# Patient Record
Sex: Female | Born: 1945 | Race: Black or African American | Hispanic: No | Marital: Single | Smoking: Former smoker
Health system: Southern US, Community
[De-identification: ages and names within clinical notes are randomized; demographics above are authoritative.]

## PROBLEM LIST (undated history)

## (undated) DIAGNOSIS — M1990 Unspecified osteoarthritis, unspecified site: Secondary | ICD-10-CM

## (undated) DIAGNOSIS — Z87891 Personal history of nicotine dependence: Secondary | ICD-10-CM

## (undated) DIAGNOSIS — H6120 Impacted cerumen, unspecified ear: Secondary | ICD-10-CM

## (undated) DIAGNOSIS — J019 Acute sinusitis, unspecified: Secondary | ICD-10-CM

## (undated) DIAGNOSIS — R270 Ataxia, unspecified: Secondary | ICD-10-CM

## (undated) DIAGNOSIS — R1012 Left upper quadrant pain: Secondary | ICD-10-CM

## (undated) DIAGNOSIS — M79676 Pain in unspecified toe(s): Secondary | ICD-10-CM

## (undated) DIAGNOSIS — M5030 Other cervical disc degeneration, unspecified cervical region: Secondary | ICD-10-CM

## (undated) DIAGNOSIS — R109 Unspecified abdominal pain: Secondary | ICD-10-CM

## (undated) DIAGNOSIS — T7840XA Allergy, unspecified, initial encounter: Secondary | ICD-10-CM

## (undated) DIAGNOSIS — R5383 Other fatigue: Secondary | ICD-10-CM

## (undated) DIAGNOSIS — J069 Acute upper respiratory infection, unspecified: Secondary | ICD-10-CM

## (undated) DIAGNOSIS — G4733 Obstructive sleep apnea (adult) (pediatric): Secondary | ICD-10-CM

## (undated) DIAGNOSIS — I10 Essential (primary) hypertension: Secondary | ICD-10-CM

## (undated) DIAGNOSIS — G709 Myoneural disorder, unspecified: Secondary | ICD-10-CM

## (undated) DIAGNOSIS — R197 Diarrhea, unspecified: Secondary | ICD-10-CM

## (undated) DIAGNOSIS — D869 Sarcoidosis, unspecified: Secondary | ICD-10-CM

## (undated) DIAGNOSIS — F32A Depression, unspecified: Secondary | ICD-10-CM

## (undated) DIAGNOSIS — G4730 Sleep apnea, unspecified: Secondary | ICD-10-CM

## (undated) DIAGNOSIS — N182 Chronic kidney disease, stage 2 (mild): Secondary | ICD-10-CM

## (undated) DIAGNOSIS — D126 Benign neoplasm of colon, unspecified: Secondary | ICD-10-CM

## (undated) DIAGNOSIS — M542 Cervicalgia: Secondary | ICD-10-CM

## (undated) DIAGNOSIS — N179 Acute kidney failure, unspecified: Secondary | ICD-10-CM

## (undated) DIAGNOSIS — H409 Unspecified glaucoma: Secondary | ICD-10-CM

## (undated) DIAGNOSIS — N189 Chronic kidney disease, unspecified: Secondary | ICD-10-CM

## (undated) DIAGNOSIS — Z9289 Personal history of other medical treatment: Secondary | ICD-10-CM

## (undated) DIAGNOSIS — G4736 Sleep related hypoventilation in conditions classified elsewhere: Secondary | ICD-10-CM

## (undated) DIAGNOSIS — E662 Morbid (severe) obesity with alveolar hypoventilation: Secondary | ICD-10-CM

## (undated) DIAGNOSIS — E1142 Type 2 diabetes mellitus with diabetic polyneuropathy: Secondary | ICD-10-CM

## (undated) DIAGNOSIS — L52 Erythema nodosum: Secondary | ICD-10-CM

## (undated) DIAGNOSIS — R0683 Snoring: Secondary | ICD-10-CM

## (undated) DIAGNOSIS — E785 Hyperlipidemia, unspecified: Secondary | ICD-10-CM

## (undated) DIAGNOSIS — R490 Dysphonia: Secondary | ICD-10-CM

## (undated) DIAGNOSIS — E1140 Type 2 diabetes mellitus with diabetic neuropathy, unspecified: Secondary | ICD-10-CM

## (undated) DIAGNOSIS — K219 Gastro-esophageal reflux disease without esophagitis: Secondary | ICD-10-CM

## (undated) DIAGNOSIS — H9201 Otalgia, right ear: Secondary | ICD-10-CM

## (undated) DIAGNOSIS — E1165 Type 2 diabetes mellitus with hyperglycemia: Secondary | ICD-10-CM

## (undated) DIAGNOSIS — H269 Unspecified cataract: Secondary | ICD-10-CM

## (undated) DIAGNOSIS — G4710 Hypersomnia, unspecified: Secondary | ICD-10-CM

## (undated) DIAGNOSIS — M129 Arthropathy, unspecified: Secondary | ICD-10-CM

## (undated) DIAGNOSIS — R251 Tremor, unspecified: Secondary | ICD-10-CM

## (undated) DIAGNOSIS — M545 Low back pain: Secondary | ICD-10-CM

## (undated) DIAGNOSIS — J984 Other disorders of lung: Secondary | ICD-10-CM

## (undated) DIAGNOSIS — R0789 Other chest pain: Secondary | ICD-10-CM

## (undated) HISTORY — DX: Chronic kidney disease, unspecified: N18.9

## (undated) HISTORY — DX: Acute upper respiratory infection, unspecified: J06.9

## (undated) HISTORY — DX: Diarrhea, unspecified: R19.7

## (undated) HISTORY — DX: Personal history of other medical treatment: Z92.89

## (undated) HISTORY — DX: Acute sinusitis, unspecified: J01.90

## (undated) HISTORY — DX: Sleep related hypoventilation in conditions classified elsewhere: G47.36

## (undated) HISTORY — DX: Low back pain: M54.5

## (undated) HISTORY — DX: Essential (primary) hypertension: I10

## (undated) HISTORY — DX: Unspecified glaucoma: H40.9

## (undated) HISTORY — PX: APPENDECTOMY: SHX54

## (undated) HISTORY — DX: Unspecified abdominal pain: R10.9

## (undated) HISTORY — DX: Morbid (severe) obesity due to excess calories: E66.01

## (undated) HISTORY — DX: Otalgia, right ear: H92.01

## (undated) HISTORY — DX: Benign neoplasm of colon, unspecified: D12.6

## (undated) HISTORY — DX: Acute kidney failure, unspecified: N17.9

## (undated) HISTORY — DX: Unspecified cataract: H26.9

## (undated) HISTORY — DX: Erythema nodosum: L52

## (undated) HISTORY — DX: Other chest pain: R07.89

## (undated) HISTORY — PX: EYE SURGERY: SHX253

## (undated) HISTORY — DX: Dysphonia: R49.0

## (undated) HISTORY — PX: CATARACT EXTRACTION, BILATERAL: SHX1313

## (undated) HISTORY — DX: Type 2 diabetes mellitus with diabetic neuropathy, unspecified: E11.40

## (undated) HISTORY — DX: Left upper quadrant pain: R10.12

## (undated) HISTORY — DX: Hypersomnia, unspecified: G47.10

## (undated) HISTORY — DX: Hyperlipidemia, unspecified: E78.5

## (undated) HISTORY — DX: Pain in unspecified toe(s): M79.676

## (undated) HISTORY — DX: Snoring: R06.83

## (undated) HISTORY — DX: Allergy, unspecified, initial encounter: T78.40XA

## (undated) HISTORY — DX: Depression, unspecified: F32.A

## (undated) HISTORY — PX: ABDOMINAL HYSTERECTOMY: SHX81

## (undated) HISTORY — DX: Obstructive sleep apnea (adult) (pediatric): G47.33

## (undated) HISTORY — DX: Personal history of nicotine dependence: Z87.891

## (undated) HISTORY — PX: SPINE SURGERY: SHX786

## (undated) HISTORY — DX: Type 2 diabetes mellitus with hyperglycemia: E11.65

## (undated) HISTORY — DX: Other fatigue: R53.83

## (undated) HISTORY — DX: Gastro-esophageal reflux disease without esophagitis: K21.9

## (undated) HISTORY — DX: Unspecified osteoarthritis, unspecified site: M19.90

## (undated) HISTORY — DX: Impacted cerumen, unspecified ear: H61.20

## (undated) HISTORY — DX: Morbid (severe) obesity with alveolar hypoventilation: E66.2

## (undated) HISTORY — PX: TONSILLECTOMY: SUR1361

## (undated) HISTORY — DX: Cervicalgia: M54.2

## (undated) HISTORY — DX: Other cervical disc degeneration, unspecified cervical region: M50.30

## (undated) HISTORY — DX: Other disorders of lung: J98.4

## (undated) HISTORY — DX: Myoneural disorder, unspecified: G70.9

## (undated) HISTORY — DX: Sarcoidosis, unspecified: D86.9

## (undated) HISTORY — DX: Type 2 diabetes mellitus with diabetic polyneuropathy: E11.42

## (undated) HISTORY — DX: Chronic kidney disease, stage 2 (mild): N18.2

## (undated) HISTORY — DX: Arthropathy, unspecified: M12.9

## (undated) HISTORY — DX: Ataxia, unspecified: R27.0

## (undated) HISTORY — DX: Sleep apnea, unspecified: G47.30

## (undated) HISTORY — DX: Tremor, unspecified: R25.1

---

## 1998-06-03 ENCOUNTER — Other Ambulatory Visit: Admission: RE | Admit: 1998-06-03 | Discharge: 1998-06-03 | Payer: Self-pay | Admitting: Obstetrics and Gynecology

## 1998-09-29 ENCOUNTER — Inpatient Hospital Stay (HOSPITAL_COMMUNITY): Admission: EM | Admit: 1998-09-29 | Discharge: 1998-09-30 | Payer: Self-pay | Admitting: Emergency Medicine

## 1998-09-29 ENCOUNTER — Encounter: Payer: Self-pay | Admitting: Emergency Medicine

## 1998-12-14 ENCOUNTER — Ambulatory Visit (HOSPITAL_COMMUNITY): Admission: RE | Admit: 1998-12-14 | Discharge: 1998-12-14 | Payer: Self-pay | Admitting: Gastroenterology

## 2000-02-06 ENCOUNTER — Other Ambulatory Visit: Admission: RE | Admit: 2000-02-06 | Discharge: 2000-02-06 | Payer: Self-pay | Admitting: Obstetrics and Gynecology

## 2001-08-12 ENCOUNTER — Other Ambulatory Visit: Admission: RE | Admit: 2001-08-12 | Discharge: 2001-08-12 | Payer: Self-pay | Admitting: Obstetrics and Gynecology

## 2002-12-07 ENCOUNTER — Encounter: Payer: Self-pay | Admitting: Neurosurgery

## 2002-12-09 ENCOUNTER — Observation Stay (HOSPITAL_COMMUNITY): Admission: RE | Admit: 2002-12-09 | Discharge: 2002-12-10 | Payer: Self-pay | Admitting: Neurosurgery

## 2002-12-09 ENCOUNTER — Encounter: Payer: Self-pay | Admitting: Neurosurgery

## 2003-01-28 ENCOUNTER — Ambulatory Visit (HOSPITAL_COMMUNITY): Admission: RE | Admit: 2003-01-28 | Discharge: 2003-01-28 | Payer: Self-pay | Admitting: Neurosurgery

## 2003-02-19 ENCOUNTER — Inpatient Hospital Stay (HOSPITAL_COMMUNITY): Admission: RE | Admit: 2003-02-19 | Discharge: 2003-02-22 | Payer: Self-pay | Admitting: Neurosurgery

## 2003-07-23 ENCOUNTER — Other Ambulatory Visit: Admission: RE | Admit: 2003-07-23 | Discharge: 2003-07-23 | Payer: Self-pay | Admitting: Obstetrics and Gynecology

## 2003-08-19 ENCOUNTER — Ambulatory Visit (HOSPITAL_COMMUNITY): Admission: RE | Admit: 2003-08-19 | Discharge: 2003-08-19 | Payer: Self-pay | Admitting: Emergency Medicine

## 2004-07-07 ENCOUNTER — Encounter: Admission: RE | Admit: 2004-07-07 | Discharge: 2004-07-07 | Payer: Self-pay | Admitting: Emergency Medicine

## 2004-07-14 ENCOUNTER — Encounter: Admission: RE | Admit: 2004-07-14 | Discharge: 2004-07-14 | Payer: Self-pay | Admitting: Orthopedic Surgery

## 2004-08-02 ENCOUNTER — Encounter: Admission: RE | Admit: 2004-08-02 | Discharge: 2004-08-02 | Payer: Self-pay | Admitting: Orthopedic Surgery

## 2004-08-23 ENCOUNTER — Encounter: Admission: RE | Admit: 2004-08-23 | Discharge: 2004-08-23 | Payer: Self-pay | Admitting: Orthopedic Surgery

## 2005-05-28 ENCOUNTER — Ambulatory Visit: Payer: Self-pay | Admitting: Emergency Medicine

## 2005-06-04 ENCOUNTER — Ambulatory Visit: Payer: Self-pay | Admitting: Internal Medicine

## 2005-06-13 ENCOUNTER — Ambulatory Visit: Payer: Self-pay | Admitting: Emergency Medicine

## 2005-07-02 ENCOUNTER — Ambulatory Visit: Payer: Self-pay | Admitting: Emergency Medicine

## 2005-10-14 ENCOUNTER — Encounter: Admission: RE | Admit: 2005-10-14 | Discharge: 2005-10-14 | Payer: Self-pay | Admitting: Orthopedic Surgery

## 2005-10-16 ENCOUNTER — Ambulatory Visit: Payer: Self-pay | Admitting: Emergency Medicine

## 2006-01-14 ENCOUNTER — Ambulatory Visit: Payer: Self-pay | Admitting: Emergency Medicine

## 2006-01-15 ENCOUNTER — Ambulatory Visit: Payer: Self-pay | Admitting: Emergency Medicine

## 2006-01-17 ENCOUNTER — Encounter: Admission: RE | Admit: 2006-01-17 | Discharge: 2006-01-17 | Payer: Self-pay | Admitting: Emergency Medicine

## 2006-07-23 ENCOUNTER — Encounter: Admission: RE | Admit: 2006-07-23 | Discharge: 2006-07-23 | Payer: Self-pay | Admitting: Orthopedic Surgery

## 2006-08-05 ENCOUNTER — Encounter: Admission: RE | Admit: 2006-08-05 | Discharge: 2006-08-05 | Payer: Self-pay | Admitting: Orthopedic Surgery

## 2006-08-19 ENCOUNTER — Encounter: Admission: RE | Admit: 2006-08-19 | Discharge: 2006-08-19 | Payer: Self-pay | Admitting: Orthopedic Surgery

## 2007-01-15 ENCOUNTER — Encounter: Admission: RE | Admit: 2007-01-15 | Discharge: 2007-01-15 | Payer: Self-pay | Admitting: Orthopedic Surgery

## 2007-02-07 DIAGNOSIS — E1165 Type 2 diabetes mellitus with hyperglycemia: Secondary | ICD-10-CM | POA: Insufficient documentation

## 2007-02-07 DIAGNOSIS — Z87891 Personal history of nicotine dependence: Secondary | ICD-10-CM

## 2007-02-07 DIAGNOSIS — Z9079 Acquired absence of other genital organ(s): Secondary | ICD-10-CM | POA: Insufficient documentation

## 2007-02-07 DIAGNOSIS — I10 Essential (primary) hypertension: Secondary | ICD-10-CM | POA: Insufficient documentation

## 2007-02-07 DIAGNOSIS — IMO0002 Reserved for concepts with insufficient information to code with codable children: Secondary | ICD-10-CM

## 2007-02-07 DIAGNOSIS — E1129 Type 2 diabetes mellitus with other diabetic kidney complication: Secondary | ICD-10-CM | POA: Insufficient documentation

## 2007-02-07 DIAGNOSIS — D869 Sarcoidosis, unspecified: Secondary | ICD-10-CM | POA: Insufficient documentation

## 2007-02-07 DIAGNOSIS — E785 Hyperlipidemia, unspecified: Secondary | ICD-10-CM | POA: Insufficient documentation

## 2007-02-07 DIAGNOSIS — Z9289 Personal history of other medical treatment: Secondary | ICD-10-CM | POA: Insufficient documentation

## 2007-02-07 DIAGNOSIS — D860 Sarcoidosis of lung: Secondary | ICD-10-CM | POA: Insufficient documentation

## 2007-02-07 HISTORY — DX: Type 2 diabetes mellitus with hyperglycemia: E11.65

## 2007-02-07 HISTORY — DX: Essential (primary) hypertension: I10

## 2007-02-07 HISTORY — DX: Reserved for concepts with insufficient information to code with codable children: IMO0002

## 2007-02-07 HISTORY — DX: Personal history of nicotine dependence: Z87.891

## 2007-03-07 ENCOUNTER — Ambulatory Visit: Payer: Self-pay | Admitting: Emergency Medicine

## 2007-04-03 ENCOUNTER — Ambulatory Visit: Payer: Self-pay | Admitting: Emergency Medicine

## 2007-05-05 ENCOUNTER — Ambulatory Visit: Payer: Self-pay | Admitting: Emergency Medicine

## 2008-02-26 ENCOUNTER — Emergency Department (HOSPITAL_COMMUNITY): Admission: EM | Admit: 2008-02-26 | Discharge: 2008-02-26 | Payer: Self-pay | Admitting: Family Medicine

## 2008-04-03 ENCOUNTER — Emergency Department (HOSPITAL_COMMUNITY): Admission: EM | Admit: 2008-04-03 | Discharge: 2008-04-03 | Payer: Self-pay | Admitting: Emergency Medicine

## 2008-04-07 ENCOUNTER — Encounter: Admission: RE | Admit: 2008-04-07 | Discharge: 2008-04-07 | Payer: Self-pay | Admitting: Orthopedic Surgery

## 2008-05-13 ENCOUNTER — Encounter: Admission: RE | Admit: 2008-05-13 | Discharge: 2008-05-13 | Payer: Self-pay | Admitting: Orthopedic Surgery

## 2008-05-28 ENCOUNTER — Ambulatory Visit: Payer: Self-pay | Admitting: Family Medicine

## 2008-05-28 DIAGNOSIS — E1140 Type 2 diabetes mellitus with diabetic neuropathy, unspecified: Secondary | ICD-10-CM

## 2008-05-28 DIAGNOSIS — M545 Low back pain, unspecified: Secondary | ICD-10-CM

## 2008-05-28 HISTORY — DX: Type 2 diabetes mellitus with diabetic neuropathy, unspecified: E11.40

## 2008-05-28 HISTORY — DX: Low back pain, unspecified: M54.50

## 2008-06-04 ENCOUNTER — Ambulatory Visit: Payer: Self-pay | Admitting: Family Medicine

## 2008-06-04 ENCOUNTER — Encounter: Admission: RE | Admit: 2008-06-04 | Discharge: 2008-06-04 | Payer: Self-pay | Admitting: Orthopedic Surgery

## 2008-06-04 LAB — CONVERTED CEMR LAB: Hgb A1c MFr Bld: 12.6 %

## 2008-06-06 LAB — CONVERTED CEMR LAB
ALT: 25 units/L (ref 0–35)
AST: 21 units/L (ref 0–37)
CO2: 21 meq/L (ref 19–32)
Cholesterol: 305 mg/dL — ABNORMAL HIGH (ref 0–200)
LDL Cholesterol: 207 mg/dL — ABNORMAL HIGH (ref 0–99)
Magnesium: 2.1 mg/dL (ref 1.5–2.5)
Sodium: 138 meq/L (ref 135–145)
TSH: 2.738 microintl units/mL (ref 0.350–4.50)
Total Bilirubin: 0.4 mg/dL (ref 0.3–1.2)
Total CHOL/HDL Ratio: 3.8
Total Protein: 7 g/dL (ref 6.0–8.3)
VLDL: 17 mg/dL (ref 0–40)

## 2008-07-02 ENCOUNTER — Ambulatory Visit: Payer: Self-pay | Admitting: Family Medicine

## 2008-09-01 ENCOUNTER — Ambulatory Visit: Payer: Self-pay | Admitting: Family Medicine

## 2008-09-01 LAB — CONVERTED CEMR LAB: Hgb A1c MFr Bld: 9.8 %

## 2008-09-03 ENCOUNTER — Ambulatory Visit: Payer: Self-pay | Admitting: Family Medicine

## 2008-09-03 LAB — CONVERTED CEMR LAB

## 2008-09-06 LAB — CONVERTED CEMR LAB
ALT: 17 units/L (ref 0–35)
AST: 17 units/L (ref 0–37)
Alkaline Phosphatase: 81 units/L (ref 39–117)
BUN: 35 mg/dL — ABNORMAL HIGH (ref 6–23)
Chloride: 101 meq/L (ref 96–112)
Creatinine, Ser: 1.15 mg/dL (ref 0.40–1.20)
Total Bilirubin: 0.4 mg/dL (ref 0.3–1.2)

## 2008-09-13 ENCOUNTER — Telehealth: Payer: Self-pay | Admitting: *Deleted

## 2008-09-15 ENCOUNTER — Ambulatory Visit: Payer: Self-pay | Admitting: Emergency Medicine

## 2008-11-12 ENCOUNTER — Encounter: Admission: RE | Admit: 2008-11-12 | Discharge: 2008-11-12 | Payer: Self-pay | Admitting: Orthopedic Surgery

## 2008-12-09 ENCOUNTER — Encounter: Admission: RE | Admit: 2008-12-09 | Discharge: 2008-12-09 | Payer: Self-pay | Admitting: Orthopedic Surgery

## 2009-02-25 ENCOUNTER — Ambulatory Visit: Payer: Self-pay | Admitting: Family Medicine

## 2009-02-25 DIAGNOSIS — H409 Unspecified glaucoma: Secondary | ICD-10-CM

## 2009-02-25 HISTORY — DX: Unspecified glaucoma: H40.9

## 2009-02-25 LAB — CONVERTED CEMR LAB: Hgb A1c MFr Bld: 11.4 %

## 2009-02-28 LAB — CONVERTED CEMR LAB
ALT: 14 units/L (ref 0–35)
Albumin: 3.9 g/dL (ref 3.5–5.2)
CO2: 23 meq/L (ref 19–32)
Chloride: 104 meq/L (ref 96–112)
Direct LDL: 203 mg/dL — ABNORMAL HIGH
Glucose, Bld: 138 mg/dL — ABNORMAL HIGH (ref 70–99)
Potassium: 4.5 meq/L (ref 3.5–5.3)
Sodium: 139 meq/L (ref 135–145)
Total Protein: 6.6 g/dL (ref 6.0–8.3)

## 2009-03-16 ENCOUNTER — Ambulatory Visit: Payer: Self-pay | Admitting: Family Medicine

## 2009-03-21 ENCOUNTER — Encounter: Payer: Self-pay | Admitting: *Deleted

## 2009-03-24 ENCOUNTER — Emergency Department (HOSPITAL_COMMUNITY): Admission: EM | Admit: 2009-03-24 | Discharge: 2009-03-24 | Payer: Self-pay | Admitting: Family Medicine

## 2009-04-06 ENCOUNTER — Telehealth: Payer: Self-pay | Admitting: Family Medicine

## 2009-04-06 ENCOUNTER — Ambulatory Visit: Payer: Self-pay | Admitting: Family Medicine

## 2009-04-11 ENCOUNTER — Encounter: Payer: Self-pay | Admitting: Family Medicine

## 2009-04-23 HISTORY — PX: POLYPECTOMY: SHX149

## 2009-04-23 HISTORY — PX: COLONOSCOPY: SHX174

## 2009-04-28 ENCOUNTER — Encounter: Payer: Self-pay | Admitting: Family Medicine

## 2009-05-03 DIAGNOSIS — D126 Benign neoplasm of colon, unspecified: Secondary | ICD-10-CM

## 2009-05-03 HISTORY — DX: Benign neoplasm of colon, unspecified: D12.6

## 2009-06-05 ENCOUNTER — Emergency Department (HOSPITAL_COMMUNITY): Admission: EM | Admit: 2009-06-05 | Discharge: 2009-06-05 | Payer: Self-pay | Admitting: Family Medicine

## 2009-07-01 ENCOUNTER — Ambulatory Visit: Payer: Self-pay | Admitting: Family Medicine

## 2009-07-01 LAB — CONVERTED CEMR LAB
Hgb A1c MFr Bld: 10.5 %
Ketones, urine, test strip: NEGATIVE
Nitrite: NEGATIVE
Specific Gravity, Urine: 1.02
Urobilinogen, UA: 0.2
WBC Urine, dipstick: NEGATIVE

## 2009-07-13 ENCOUNTER — Encounter: Payer: Self-pay | Admitting: Family Medicine

## 2009-07-13 ENCOUNTER — Ambulatory Visit: Payer: Self-pay | Admitting: Family Medicine

## 2009-07-14 LAB — CONVERTED CEMR LAB
BUN: 21 mg/dL (ref 6–23)
Chloride: 105 meq/L (ref 96–112)
Creatinine, Ser: 0.99 mg/dL (ref 0.40–1.20)
Glucose, Bld: 143 mg/dL — ABNORMAL HIGH (ref 70–99)
LDL Cholesterol: 157 mg/dL — ABNORMAL HIGH (ref 0–99)
Potassium: 4.5 meq/L (ref 3.5–5.3)
Triglycerides: 90 mg/dL (ref <150)
VLDL: 18 mg/dL (ref 0–40)

## 2009-10-05 ENCOUNTER — Ambulatory Visit: Payer: Self-pay | Admitting: Family Medicine

## 2009-10-05 LAB — CONVERTED CEMR LAB
Albumin: 3.8 g/dL (ref 3.5–5.2)
Angiotensin 1 Converting Enzyme: 62 units/L (ref 9–67)
BUN: 20 mg/dL (ref 6–23)
CO2: 27 meq/L (ref 19–32)
Calcium: 9.1 mg/dL (ref 8.4–10.5)
Chloride: 101 meq/L (ref 96–112)
Glucose, Bld: 121 mg/dL — ABNORMAL HIGH (ref 70–99)
Hemoglobin: 13.4 g/dL (ref 12.0–15.0)
Potassium: 4.7 meq/L (ref 3.5–5.3)
RBC: 4.78 M/uL (ref 3.87–5.11)
Sodium: 138 meq/L (ref 135–145)
Total Protein: 7.1 g/dL (ref 6.0–8.3)
WBC: 5.8 10*3/uL (ref 4.0–10.5)

## 2009-10-06 ENCOUNTER — Encounter: Payer: Self-pay | Admitting: Family Medicine

## 2009-10-07 ENCOUNTER — Encounter: Payer: Self-pay | Admitting: Family Medicine

## 2009-11-25 ENCOUNTER — Ambulatory Visit: Payer: Self-pay | Admitting: Family Medicine

## 2009-11-25 DIAGNOSIS — I10 Essential (primary) hypertension: Secondary | ICD-10-CM | POA: Insufficient documentation

## 2009-11-25 LAB — CONVERTED CEMR LAB
CO2: 25 meq/L (ref 19–32)
Calcium: 9.2 mg/dL (ref 8.4–10.5)
Creatinine, Ser: 1.18 mg/dL (ref 0.40–1.20)
Hgb A1c MFr Bld: 10.9 %
Sodium: 135 meq/L (ref 135–145)
TSH: 1.707 microintl units/mL (ref 0.350–4.500)

## 2009-12-01 ENCOUNTER — Ambulatory Visit: Payer: Self-pay | Admitting: Family Medicine

## 2009-12-02 ENCOUNTER — Ambulatory Visit: Payer: Self-pay | Admitting: Family Medicine

## 2009-12-05 ENCOUNTER — Encounter: Payer: Self-pay | Admitting: Family Medicine

## 2009-12-13 ENCOUNTER — Encounter: Payer: Self-pay | Admitting: Family Medicine

## 2009-12-13 ENCOUNTER — Ambulatory Visit (HOSPITAL_COMMUNITY): Admission: RE | Admit: 2009-12-13 | Discharge: 2009-12-13 | Payer: Self-pay | Admitting: Family Medicine

## 2009-12-23 ENCOUNTER — Ambulatory Visit: Payer: Self-pay | Admitting: Family Medicine

## 2010-01-19 ENCOUNTER — Encounter: Payer: Self-pay | Admitting: Family Medicine

## 2010-01-26 ENCOUNTER — Ambulatory Visit: Payer: Self-pay | Admitting: Emergency Medicine

## 2010-01-30 ENCOUNTER — Telehealth: Payer: Self-pay | Admitting: Family Medicine

## 2010-01-31 ENCOUNTER — Ambulatory Visit: Payer: Self-pay | Admitting: Family Medicine

## 2010-01-31 DIAGNOSIS — H6120 Impacted cerumen, unspecified ear: Secondary | ICD-10-CM | POA: Insufficient documentation

## 2010-01-31 HISTORY — DX: Impacted cerumen, unspecified ear: H61.20

## 2010-02-13 ENCOUNTER — Encounter: Payer: Self-pay | Admitting: Family Medicine

## 2010-03-15 ENCOUNTER — Ambulatory Visit: Payer: Self-pay | Admitting: Family Medicine

## 2010-03-15 DIAGNOSIS — J069 Acute upper respiratory infection, unspecified: Secondary | ICD-10-CM

## 2010-03-15 HISTORY — DX: Acute upper respiratory infection, unspecified: J06.9

## 2010-03-15 LAB — CONVERTED CEMR LAB: Hgb A1c MFr Bld: 11.7 %

## 2010-03-21 ENCOUNTER — Telehealth: Payer: Self-pay | Admitting: Family Medicine

## 2010-03-31 ENCOUNTER — Ambulatory Visit: Payer: Self-pay

## 2010-05-25 NOTE — Miscellaneous (Signed)
Summary: pharmacy call  Clinical Lists Changes received call from  pharmacy stating patient received last refill on Crestor Dec 2010 and she received # 90 tabs. they have recieved recent refill request and they are concerned that patient has been non compliant with taking the medication or wonder if she is actually taking a lower dose. . they needed to let MD know. Theresia Lo RN  February 13, 2010 11:36 AM  Please call patient and ask to schedule a follow up appointment in the next 2-4 weeks.  [name] Albino MD  February 13, 2010 1:37 PM

## 2010-05-25 NOTE — Assessment & Plan Note (Signed)
Summary: f/u tests,df   Vital Signs:  Patient profile:   65 year old female Height:      65.5 inches Weight:      307 pounds BMI:     50.49 Temp:     98.0 degrees F oral Pulse rate:   76 / minute BP sitting:   134 / 88  (left arm) Cuff size:   large  Vitals Entered By: Jimmy Footman, CMA (December 23, 2009 1:30 PM) CC: f/u lab results Is Patient Diabetic? Yes Pain Assessment Patient in pain? no        Primary Care Leela Vanbrocklin:  Dr Leveda Anna  CC:  f/u lab results.  History of Present Illness: FU spells of SOB.  Much improved but not absent. Explained normal Holter Explained echo and grade 1 diastolic dysfunction CHF She is concerned about Sarcoidosis.  Plans to see Dr. Delton Coombes.  I encouraged her to follow through. May still need stress test but will put off for now. Wt up - plans to go to weight watchers Anxiety and stress - she is coping OK without meds DM focusing on diet - fasting sugars 100-140 much improved  Habits & Providers  Alcohol-Tobacco-Diet     Tobacco Status: quit     Tobacco Counseling: to quit use of tobacco products     Year Quit: 2002  Current Medications (verified): 1)  Humulin 70/30 Pen 70-30 % Susp (Insulin Isophane & Regular) .... 75 Units Each Morning and 60 Units Each Evening.  Disp Qs For 3 Month Supply 2)  Micardis Hct 80-12.5 Mg  Tabs (Telmisartan-Hctz) .... Take 1 Tablet By Mouth Once A Day 3)  Metformin Hcl 1000 Mg Tabs (Metformin Hcl) .... One By Mouth Two Times A Day 4)  Aspirin 81 Mg Tbec (Aspirin) .... One Daily 5)  Gabapentin 300 Mg  Caps (Gabapentin) .... One By Mouth Tid 6)  Furosemide 20 Mg Tabs (Furosemide) .... Take 1 Tab By Mouth Every Day 7)  Crestor 40 Mg Tabs (Rosuvastatin Calcium) .... One By Mouth Daily 8)  Timoptic 0.25 % Soln (Timolol Maleate) .... Two Times A Day Per Optho 9)  Metoprolol Succinate 100 Mg Xr24h-Tab (Metoprolol Succinate) .... Once A Day 10)  Amlodipine Besylate 10 Mg Tabs (Amlodipine Besylate) .... One By  Mouth Daily 11)  Bd Pen Needle Ultrafine 29g X 12.80mm Misc (Insulin Pen Needle) .... Use Twice Daily  Allergies (verified): 1)  ! Penicillin  Past History:  Past medical, surgical, family and social histories (including risk factors) reviewed, and no changes noted (except as noted below).  Past Medical History: Reviewed history from 04/06/2009 and no changes required. Hyperlipidemia Hypertension Sarcoidosis Diabetes  Past Surgical History: Reviewed history from 05/28/2008 and no changes required. Back surg x 2 in 2004 Hysterectomy  and BSO  and appendectomy for benign disease 1983  Family History: Reviewed history from 05/28/2008 and no changes required. + HBP, DM, CAD, CA, CVA, alcoholism  Social History: Reviewed history from 05/28/2008 and no changes required. Non smoker quit in 2001 EtOh social rare Exercise not regularly Diet, not as good as it should be.   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  Physical Exam  General:  Well-developed,well-nourished,in no acute distress; alert,appropriate and cooperative throughout examination Lungs:  Normal respiratory effort, chest expands symmetrically. Lungs are clear to auscultation, no crackles or wheezes. Heart:  Normal rate and regular rhythm. S1 and S2 normal without gallop, murmur, click, rub or other extra sounds.   Impression & Recommendations:  Problem # 1:  DIASTOLIC DYSFUNCTION (ICD-429.9) Assessment Improved  Continue furosemide and BP meds  Orders: [facility]- Est  Level 4 (08657)  Problem # 2:  HYPERTENSION (ICD-401.9) Assessment: Improved  I am delighted about her increased focus on lifestyle Her updated medication list for this problem includes:    Micardis Hct 80-12.5 Mg Tabs (Telmisartan-hctz) .Marland Kitchen... Take 1 tablet by mouth once a day    Furosemide 20 Mg Tabs (Furosemide) .Marland Kitchen... Take 1 tab by mouth every day    Metoprolol Succinate 100 Mg Xr24h-tab (Metoprolol succinate) ..... Once a day     Amlodipine Besylate 10 Mg Tabs (Amlodipine besylate) ..... One by mouth daily  BP today: 134/88 Prior BP: 172/86 (11/25/2009)  Labs Reviewed: K+: 4.3 (11/25/2009) Creat: : 1.18 (11/25/2009)   Chol: 237 (07/13/2009)   HDL: 62 (07/13/2009)   LDL: 157 (07/13/2009)   TG: 90 (07/13/2009)  Orders: [facility]- Est  Level 4 (84696)  Problem # 3:  DIABETES MELLITUS (ICD-250.00) Assessment: Improved  improved based on reports of FBS Her updated medication list for this problem includes:    Humulin 70/30 Pen 70-30 % Susp (Insulin isophane & regular) .Marland KitchenMarland KitchenMarland KitchenMarland Kitchen 75 units each morning and 60 units each evening.  disp qs for 3 month supply    Micardis Hct 80-12.5 Mg Tabs (Telmisartan-hctz) .Marland Kitchen... Take 1 tablet by mouth once a day    Metformin Hcl 1000 Mg Tabs (Metformin hcl) ..... One by mouth two times a day    Aspirin 81 Mg Tbec (Aspirin) ..... One daily  Labs Reviewed: Creat: 1.18 (11/25/2009)    Reviewed HgBA1c results: 10.9 (11/25/2009)  10.5 (07/01/2009)  Orders: [facility]- Est  Level 4 (99214)  Complete Medication List: 1)  Humulin 70/30 Pen 70-30 % Susp (Insulin isophane & regular) .... 75 units each morning and 60 units each evening.  disp qs for 3 month supply 2)  Micardis Hct 80-12.5 Mg Tabs (Telmisartan-hctz) .... Take 1 tablet by mouth once a day 3)  Metformin Hcl 1000 Mg Tabs (Metformin hcl) .... One by mouth two times a day 4)  Aspirin 81 Mg Tbec (Aspirin) .... One daily 5)  Gabapentin 300 Mg Caps (Gabapentin) .... One by mouth tid 6)  Furosemide 20 Mg Tabs (Furosemide) .... Take 1 tab by mouth every day 7)  Crestor 40 Mg Tabs (Rosuvastatin calcium) .... One by mouth daily 8)  Timoptic 0.25 % Soln (Timolol maleate) .... Two times a day per optho 9)  Metoprolol Succinate 100 Mg Xr24h-tab (Metoprolol succinate) .... Once a day 10)  Amlodipine Besylate 10 Mg Tabs (Amlodipine besylate) .... One by mouth daily 11)  Bd Pen Needle Ultrafine 29g X 12.48mm Misc (Insulin pen needle) .... Use twice  daily  Patient Instructions: 1)  You have Stage 1 diastolic dysfunction congestive heart failure 2)  See me in 2 months, sooner if worsening symptoms    Prevention & Chronic Care Immunizations   Influenza vaccine: Fluvax Non-MCR  (02/25/2009)   Influenza vaccine due: 12/22/2009    Tetanus booster: 02/25/2009: Tdap    Pneumococcal vaccine: Not documented    H. zoster vaccine: Not documented  Colorectal Screening   Hemoccult: Not documented   Hemoccult due: Not Indicated    Colonoscopy: Results: Polyp.  Pathology:  Adenomatous polyp.          (04/28/2009)   Colonoscopy action/deferral: Repeat colonoscopy in 5 years.   (04/28/2009)   Colonoscopy due: 04/28/2014  Other Screening   Pap smear: Not documented   Pap smear due: Not  Indicated    Mammogram: Not documented    DXA bone density scan: Not documented   Smoking status: quit  (12/23/2009)  Diabetes Mellitus   HgbA1C: 10.9  (11/25/2009)   HgbA1C action/deferral: Ordered  (02/25/2009)   Hemoglobin A1C due: 12/02/2008    Eye exam: Not documented    Foot exam: yes  (11/25/2009)   High risk foot: Not documented   Foot care education: Not documented    Urine microalbumin/creatinine ratio: Not documented   Urine microalbumin action/deferral: Not indicated    Diabetes flowsheet reviewed?: Yes   Progress toward A1C goal: Improved  Lipids   Total Cholesterol: 237  (07/13/2009)   Lipid panel action/deferral: Lipid Panel ordered   LDL: 157  (07/13/2009)   LDL Direct: 203  (02/25/2009)   HDL: 62  (07/13/2009)   Triglycerides: 90  (07/13/2009)    SGOT (AST): 26  (10/05/2009)   BMP action: Ordered   SGPT (ALT): 22  (10/05/2009)   Alkaline phosphatase: 84  (10/05/2009)   Total bilirubin: 0.4  (10/05/2009)    Lipid flowsheet reviewed?: Yes   Progress toward LDL goal: Unchanged  Hypertension   Last Blood Pressure: 134 / 88  (12/23/2009)   Serum creatinine: 1.18  (11/25/2009)   BMP action: Ordered   Serum  potassium 4.3  (11/25/2009)    Hypertension flowsheet reviewed?: Yes   Progress toward BP goal: Improved  Self-Management Support :   Personal Goals (by the next clinic visit) :     Personal A1C goal: 7  (02/25/2009)     Personal blood pressure goal: 130/80  (02/25/2009)     Personal LDL goal: 100  (02/25/2009)    Diabetes self-management support: Written self-care plan  (12/23/2009)   Diabetes care plan printed    Hypertension self-management support: Written self-care plan  (12/23/2009)   Hypertension self-care plan printed.    Lipid self-management support: Written self-care plan  (12/23/2009)   Lipid self-care plan printed.

## 2010-05-25 NOTE — Assessment & Plan Note (Signed)
Summary: ? panic attack,tcb   Vital Signs:  Patient profile:   65 year old female Weight:      306 pounds Temp:     98.5 degrees F oral Pulse rate:   64 / minute Pulse rhythm:   regular BP sitting:   172 / 86  (left arm) Cuff size:   large  Vitals Entered By: Loralee Pacas CMA (November 25, 2009 4:14 PM)  Primary Care Renlee Floor:  Dr Leveda Anna   History of Present Illness: Episodes of SOB.  Has not been taking furosemide.   Does have both anxiety and palpitations with these episodes, which last about 30 minutes and then resolve.  She does not exert herself during episodes - seems to make SOB worse.  No recent trauma, surg or hx of DVT.  Does have multiple poorly controled risk factors for CAD andCHF  Bp up today, but runs nicely at home - consistently below 130/80.    Current Medications (verified): 1)  Humulin 70/30 Pen 70-30 % Susp (Insulin Isophane & Regular) .... 75 Units Each Morning and 60 Units Each Evening.  Disp Qs For 3 Month Supply 2)  Micardis Hct 80-12.5 Mg  Tabs (Telmisartan-Hctz) .... Take 1 Tablet By Mouth Once A Day 3)  Metformin Hcl 1000 Mg Tabs (Metformin Hcl) .... One By Mouth Two Times A Day 4)  Aspirin 81 Mg Tbec (Aspirin) .... One Daily 5)  Gabapentin 300 Mg  Caps (Gabapentin) .... One By Mouth Tid 6)  Furosemide 20 Mg Tabs (Furosemide) .... Take 1 Tab By Mouth Every Day 7)  Crestor 40 Mg Tabs (Rosuvastatin Calcium) .... One By Mouth Daily 8)  Timoptic 0.25 % Soln (Timolol Maleate) .... Two Times A Day Per Optho 9)  Metoprolol Succinate 100 Mg Xr24h-Tab (Metoprolol Succinate) .... Once A Day 10)  Amlodipine Besylate 10 Mg Tabs (Amlodipine Besylate) .... One By Mouth Daily 11)  Bd Pen Needle Ultrafine 29g X 12.59mm Misc (Insulin Pen Needle) .... Use Twice Daily  Allergies (verified): 1)  ! Penicillin  Past History:  Past medical, surgical, family and social histories (including risk factors) reviewed, and no changes noted (except as noted below).  Past  Medical History: Reviewed history from 04/06/2009 and no changes required. Hyperlipidemia Hypertension Sarcoidosis Diabetes  Past Surgical History: Reviewed history from 05/28/2008 and no changes required. Back surg x 2 in 2004 Hysterectomy  and BSO  and appendectomy for benign disease 1983  Family History: Reviewed history from 05/28/2008 and no changes required. + HBP, DM, CAD, CA, CVA, alcoholism  Social History: Reviewed history from 05/28/2008 and no changes required. Non smoker quit in 2001 EtOh social rare Exercise not regularly Diet, not as good as it should be.   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  Physical Exam  General:  Well-developed,well-nourished,in no acute distress; alert,appropriate and cooperative throughout examination Neck:  No deformities, masses, or tenderness noted. Lungs:  Normal respiratory effort, chest expands symmetrically. Lungs are clear to auscultation, no crackles or wheezes. Heart:  Normal rate and regular rhythm. S1 and S2 normal without gallop, murmur, click, rub or other extra sounds. Extremities:  2+ symmetric leg edema  Diabetes Management Exam:    Foot Exam (with socks and/or shoes not present):       Sensory-Pinprick/Light touch:          Left medial foot (L-4): normal          Left dorsal foot (L-5): normal  Left lateral foot (S-1): normal          Right medial foot (L-4): normal          Right dorsal foot (L-5): normal          Right lateral foot (S-1): normal       Sensory-Monofilament:          Left foot: normal          Right foot: normal       Inspection:          Left foot: normal          Right foot: normal       Nails:          Left foot: normal          Right foot: normal   Impression & Recommendations:  Problem # 1:  DYSPNEA (ICD-786.05) Assessment New Biggest worries are CHF, PAF and CAD.   Restart furosemide  Orders: 2 D Echo (2 D Echo) [facility]- Est  Level 4 ([address])  Problem # 2:   PALPITATIONS (ICD-785.1) Assessment: New PAD possible Her updated medication list for this problem includes:    Metoprolol Succinate 100 Mg Xr24h-tab (Metoprolol succinate) ..... Once a day  Orders: 24 Hr Holter (24 Hr Holter) [facility] (60454-09811) [facility]- Est  Level 4 (91478)  Problem # 3:  HYPERTENSION (ICD-401.9) Restarting furosemide should help Her updated medication list for this problem includes:    Micardis Hct 80-12.5 Mg Tabs (Telmisartan-hctz) .Marland Kitchen... Take 1 tablet by mouth once a day    Furosemide 20 Mg Tabs (Furosemide) .Marland Kitchen... Take 1 tab by mouth every day    Metoprolol Succinate 100 Mg Xr24h-tab (Metoprolol succinate) ..... Once a day    Amlodipine Besylate 10 Mg Tabs (Amlodipine besylate) ..... One by mouth daily  Orders: Basic Met-[facility] (29562-13086) [facility]- Est  Level 4 (57846)  Complete Medication List: 1)  Humulin 70/30 Pen 70-30 % Susp (Insulin isophane & regular) .... 75 units each morning and 60 units each evening.  disp qs for 3 month supply 2)  Micardis Hct 80-12.5 Mg Tabs (Telmisartan-hctz) .... Take 1 tablet by mouth once a day 3)  Metformin Hcl 1000 Mg Tabs (Metformin hcl) .... One by mouth two times a day 4)  Aspirin 81 Mg Tbec (Aspirin) .... One daily 5)  Gabapentin 300 Mg Caps (Gabapentin) .... One by mouth tid 6)  Furosemide 20 Mg Tabs (Furosemide) .... Take 1 tab by mouth every day 7)  Crestor 40 Mg Tabs (Rosuvastatin calcium) .... One by mouth daily 8)  Timoptic 0.25 % Soln (Timolol maleate) .... Two times a day per optho 9)  Metoprolol Succinate 100 Mg Xr24h-tab (Metoprolol succinate) .... Once a day 10)  Amlodipine Besylate 10 Mg Tabs (Amlodipine besylate) .... One by mouth daily 11)  Bd Pen Needle Ultrafine 29g X 12.49mm Misc (Insulin pen needle) .... Use twice daily  Other Orders: A1C-[facility] (96295)  Patient Instructions: 1)  Restart your fluid pill - furosemide.   2)  Rededicate yourself to diet and exercise. 3)  Reconsider bariatric surgery.  4)  I  will call with test results.  Laboratory Results   Blood Tests   Date/Time Received: November 25, 2009 4:02 PM  Date/Time Reported: November 25, 2009 4:15 PM   HGBA1C: 10.9%   (Normal Range: Non-Diabetic - 3-6%   Control Diabetic - 6-8%)  Comments: ...........test performed by...........Marland KitchenTerese Door, CMA       Prevention & Chronic Care Immunizations  Influenza vaccine: Fluvax Non-MCR  (02/25/2009)   Influenza vaccine due: 12/22/2009    Tetanus booster: 02/25/2009: Tdap    Pneumococcal vaccine: Not documented    H. zoster vaccine: Not documented  Colorectal Screening   Hemoccult: Not documented   Hemoccult due: Not Indicated    Colonoscopy: Results: Polyp.  Pathology:  Adenomatous polyp.          (04/28/2009)   Colonoscopy action/deferral: Repeat colonoscopy in 5 years.   (04/28/2009)   Colonoscopy due: 04/28/2014  Other Screening   Pap smear: Not documented   Pap smear due: Not Indicated    Mammogram: Not documented    DXA bone density scan: Not documented   Smoking status: quit  (10/05/2009)  Diabetes Mellitus   HgbA1C: 10.9  (11/25/2009)   HgbA1C action/deferral: Ordered  (02/25/2009)   Hemoglobin A1C due: 12/02/2008    Eye exam: Not documented    Foot exam: yes  (11/25/2009)   High risk foot: Not documented   Foot care education: Not documented    Urine microalbumin/creatinine ratio: Not documented   Urine microalbumin action/deferral: Not indicated    Diabetes flowsheet reviewed?: Yes   Progress toward A1C goal: Unchanged  Lipids   Total Cholesterol: 237  (07/13/2009)   Lipid panel action/deferral: Lipid Panel ordered   LDL: 157  (07/13/2009)   LDL Direct: 203  (02/25/2009)   HDL: 62  (07/13/2009)   Triglycerides: 90  (07/13/2009)    SGOT (AST): 26  (10/05/2009)   BMP action: Ordered   SGPT (ALT): 22  (10/05/2009)   Alkaline phosphatase: 84  (10/05/2009)   Total bilirubin: 0.4  (10/05/2009)    Lipid flowsheet reviewed?: Yes    Progress toward LDL goal: Unchanged  Hypertension   Last Blood Pressure: 172 / 86  (11/25/2009)   Serum creatinine: 1.02  (10/05/2009)   BMP action: Ordered   Serum potassium 4.7  (10/05/2009)    Hypertension flowsheet reviewed?: Yes   Progress toward BP goal: Deteriorated  Self-Management Support :   Personal Goals (by the next clinic visit) :     Personal A1C goal: 7  (02/25/2009)     Personal blood pressure goal: 130/80  (02/25/2009)     Personal LDL goal: 100  (02/25/2009)    Diabetes self-management support: Written self-care plan  (07/01/2009)    Hypertension self-management support: Written self-care plan  (07/01/2009)    Lipid self-management support: Written self-care plan  (07/01/2009)

## 2010-05-25 NOTE — Progress Notes (Signed)
Summary: triage  Phone Note Call from Patient Call back at [phone]   Caller: Patient Summary of Call: Pt's ears are clogged up and balance and pain.   Initial call taken by: Clydell Hakim,  January 30, 2010 10:38 AM  Follow-up for Phone Call        started friday. no appts left for today. she declined UC. appt at 8:30am tomorrow. states she is using drops that the pharmacist suggested Follow-up by: Golden Circle RN,  January 30, 2010 10:57 AM  Additional Follow-up for Phone Call Additional follow up Details #1::        noted and agree Additional Follow-up by: [name] Albino MD,  January 30, 2010 2:16 PM

## 2010-05-25 NOTE — Letter (Signed)
Summary: LAB Letter  [facility]  [address]   [place], [place] [address]   Phone: [phone]  Fax: [phone]    10/06/2009  [facility] [address] [place], [place]  [address]  Dear Ms. Vondrasek,  I have not gotten the ACE level back--I think it is a send-out lab and will probably be back in a few days. I drew that test for informational purposes only. All of the  other blood work looked great!        Sincerely,   Denny Levy MD  Appended Document: LAB Letter mailed.

## 2010-05-25 NOTE — Consult Note (Signed)
Summary: Holter results  EKG - Echo   Imported By: Clydell Hakim 12/15/2009 11:15:03  _____________________________________________________________________  External Attachment:    Type:   Image     Comment:   External Document

## 2010-05-25 NOTE — Progress Notes (Signed)
  Phone Note Call from Patient   Caller: Patient Call For: [phone] Summary of Call: Patient's cough not any better.  Want something called to pharmacy for that.  Please call patient back when meds have been sent.  Would like something she could take while she is working. Initial call taken by: Abundio Miu,  March 21, 2010 11:18 AM  Follow-up for Phone Call        Called and discussed.  At this point 10 days into illness, will Rx with antibiotics.  I had given her Zpac rx to be filled only if remained ill.  Told her to fill.  recommend Sugar free tussin DM.  Also, will need a CXR if still coughing next week. Follow-up by: [name] Albino MD,  March 21, 2010 3:39 PM    New/Updated Medications: AZITHROMYCIN 250 MG  TABS (AZITHROMYCIN) 2 by  mouth today and then 1 daily for 4 days Prescriptions: AZITHROMYCIN 250 MG  TABS (AZITHROMYCIN) 2 by  mouth today and then 1 daily for 4 days  #6 x 0   Entered and Authorized by:   [name] Albino MD   Signed by:   [name] Albino MD on 03/21/2010   Method used:   Handwritten   RxID:   [id number]

## 2010-05-25 NOTE — Assessment & Plan Note (Signed)
Summary: HEART MONITOR/KH  Nurse Visit   Allergies: 1)  ! Penicillin  Orders Added: 1)  Holter Monitor- [facility] [[id number]] 2)  Est Level 1- [facility] [[id number]]

## 2010-05-25 NOTE — Assessment & Plan Note (Signed)
Summary: remove monitor/kh  Nurse Visit holter monitor removed.Jimmy Footman, CMA  December 02, 2009 12:25 PM   Allergies: 1)  ! Penicillin  Orders Added: 1)  No Charge Patient Arrived (NCPA0) [NCPA0]  Appended Document: remove monitor/kh holter monitor taken to the EKG dept at hospital.

## 2010-05-25 NOTE — Assessment & Plan Note (Signed)
Summary: cough/congestion,df   Vital Signs:  Patient profile:   65 year old female Height:      65.5 inches Weight:      300 pounds BMI:     49.34 Temp:     98.0 degrees F oral Pulse rate:   82 / minute BP sitting:   154 / 94  (left arm) Cuff size:   large CC: cough and congestion x 4 days   Primary Care Provider:  [name] Albino MD  CC:  cough and congestion x 4 days.  History of Present Illness: Cough and congestion for 4 days.   Frustrated that weight not down further. Very frustrated about A1C results.  Admits sporadic compliance.   Stress level quite high - mostly with family   Current Medications (verified): 1)  Humulin 70/30 Pen 70-30 % Susp (Insulin Isophane & Regular) .... 75 Units Each Morning and 60 Units Each Evening.  Disp Qs For 3 Month Supply 2)  Micardis Hct 80-12.5 Mg  Tabs (Telmisartan-Hctz) .... Take 1 Tablet By Mouth Once A Day 3)  Metformin Hcl 1000 Mg Tabs (Metformin Hcl) .... One By Mouth Two Times A Day 4)  Aspirin 81 Mg Tbec (Aspirin) .... One Daily 5)  Gabapentin 300 Mg  Caps (Gabapentin) .... One By Mouth Tid 6)  Furosemide 20 Mg Tabs (Furosemide) .... Take 1 Tab By Mouth Every Day 7)  Crestor 40 Mg Tabs (Rosuvastatin Calcium) .... One By Mouth Daily 8)  Timoptic 0.25 % Soln (Timolol Maleate) .... Two Times A Day Per Optho 9)  Metoprolol Succinate 100 Mg Xr24h-Tab (Metoprolol Succinate) .... Once A Day 10)  Amlodipine Besylate 10 Mg Tabs (Amlodipine Besylate) .... One By Mouth Daily 11)  Bd Pen Needle Ultrafine 29g X 12.24mm Misc (Insulin Pen Needle) .... Use Twice Daily 12)  Onetouch Ultra Blue  Strp (Glucose Blood) .... Test Blood Sugar Two Times A Day  Allergies (verified): 1)  ! Penicillin  Past History:  Past medical, surgical, family and social histories (including risk factors) reviewed, and no changes noted (except as noted below).  Past Medical History: Reviewed history from 04/06/2009 and no changes  required. Hyperlipidemia Hypertension Sarcoidosis Diabetes  Past Surgical History: Reviewed history from 05/28/2008 and no changes required. Back surg x 2 in 2004 Hysterectomy  and BSO  and appendectomy for benign disease 1983  Family History: Reviewed history from 05/28/2008 and no changes required. + HBP, DM, CAD, CA, CVA, alcoholism  Social History: Reviewed history from 05/28/2008 and no changes required. Non smoker quit in 2001 EtOh social rare Exercise not regularly Diet, not as good as it should be.   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  Physical Exam  General:  Well-developed,well-nourished,in no acute distress; alert,appropriate and cooperative throughout examination Ears:  External ear exam shows no significant lesions or deformities.  Otoscopic examination reveals clear canals, tympanic membranes are intact bilaterally without bulging, retraction, inflammation or discharge. Hearing is grossly normal bilaterally. Nose:  External nasal examination shows no deformity or inflammation. Nasal mucosa are pink and moist without lesions or exudates. Mouth:  Oral mucosa and oropharynx without lesions or exudates.  Teeth in good repair. Lungs:  Normal respiratory effort, chest expands symmetrically. Lungs are clear to auscultation, no crackles or wheezes. Heart:  Normal rate and regular rhythm. S1 and S2 normal without gallop, murmur, click, rub or other extra sounds. Extremities:  trace bilateral edema.  Diabetes Management Exam:    Eye Exam:  Eye Exam done elsewhere          Date: 02/17/2010          Results: normal          Done by: Bond   Impression & Recommendations:  Problem # 1:  U R I (ICD-465.9)  observe Her updated medication list for this problem includes:    Aspirin 81 Mg Tbec (Aspirin) ..... One daily  Orders: [facility]- Est  Level 4 (41324)  Problem # 2:  HYPERTENSION (ICD-401.9)  poor control, may be due to URI, may be due to  compliance Her updated medication list for this problem includes:    Micardis Hct 80-12.5 Mg Tabs (Telmisartan-hctz) .Marland Kitchen... Take 1 tablet by mouth once a day    Furosemide 20 Mg Tabs (Furosemide) .Marland Kitchen... Take 1 tab by mouth every day    Metoprolol Succinate 100 Mg Xr24h-tab (Metoprolol succinate) ..... Once a day    Amlodipine Besylate 10 Mg Tabs (Amlodipine besylate) ..... One by mouth daily  BP today: 154/94 Prior BP: 145/84 (01/31/2010)  Labs Reviewed: K+: 4.3 (11/25/2009) Creat: : 1.18 (11/25/2009)   Chol: 237 (07/13/2009)   HDL: 62 (07/13/2009)   LDL: 157 (07/13/2009)   TG: 90 (07/13/2009)  Orders: [facility]- Est  Level 4 (40102)  Problem # 3:  DIABETES MELLITUS (ICD-250.00) Poor control.  Was only taking 60 units insulin two times a day and I increased back to what she is supposed to be taking.  Emphasized compliance.  FU in 1 month.   Her updated medication list for this problem includes:    Humulin 70/30 Pen 70-30 % Susp (Insulin isophane & regular) .Marland KitchenMarland KitchenMarland KitchenMarland Kitchen 75 units each morning and 60 units each evening.  disp qs for 3 month supply    Micardis Hct 80-12.5 Mg Tabs (Telmisartan-hctz) .Marland Kitchen... Take 1 tablet by mouth once a day    Metformin Hcl 1000 Mg Tabs (Metformin hcl) ..... One by mouth two times a day    Aspirin 81 Mg Tbec (Aspirin) ..... One daily  Orders: A1C-[facility] (72536) [facility]- Est  Level 4 (64403)  Labs Reviewed: Creat: 1.18 (11/25/2009)     Last Eye Exam: normal (02/17/2010) Reviewed HgBA1c results: 11.7 (03/15/2010)  10.9 (11/25/2009)  Problem # 4:  OBESITY (ICD-278.00) Discussed management.  She believes stress playing a major role. Orders: [facility]- Est  Level 4 (99214)  Complete Medication List: 1)  Humulin 70/30 Pen 70-30 % Susp (Insulin isophane & regular) .... 75 units each morning and 60 units each evening.  disp qs for 3 month supply 2)  Micardis Hct 80-12.5 Mg Tabs (Telmisartan-hctz) .... Take 1 tablet by mouth once a day 3)  Metformin Hcl 1000 Mg Tabs (Metformin hcl)  .... One by mouth two times a day 4)  Aspirin 81 Mg Tbec (Aspirin) .... One daily 5)  Gabapentin 300 Mg Caps (Gabapentin) .... One by mouth tid 6)  Furosemide 20 Mg Tabs (Furosemide) .... Take 1 tab by mouth every day 7)  Crestor 40 Mg Tabs (Rosuvastatin calcium) .... One by mouth daily 8)  Timoptic 0.25 % Soln (Timolol maleate) .... Two times a day per optho 9)  Metoprolol Succinate 100 Mg Xr24h-tab (Metoprolol succinate) .... Once a day 10)  Amlodipine Besylate 10 Mg Tabs (Amlodipine besylate) .... One by mouth daily 11)  Bd Pen Needle Ultrafine 29g X 12.38mm Misc (Insulin pen needle) .... Use twice daily 12)  Onetouch Ultra Blue Strp (Glucose blood) .... Test blood sugar two times a day Prescriptions: ONETOUCH ULTRA  BLUE  STRP (GLUCOSE BLOOD) Test blood sugar two times a day  #180 x 3   Entered and Authorized by:   [name] Albino MD   Signed by:   [name] Albino MD on 03/15/2010   Method used:   [facility] to        [facility] [address] [phone]* (retail)       [address]       [place], [place]  [address]       Ph: [phone] or [phone]       Fax: [phone]   RxID:   [id number]    Orders Added: 1)  A1C-[facility] [83036] 2)  [facility]- Est  Level 4 [36644]     Prevention & Chronic Care Immunizations   Influenza vaccine: Fluvax Non-MCR  (02/25/2009)   Influenza vaccine due: 12/22/2009    Tetanus booster: 02/25/2009: Tdap    Pneumococcal vaccine: Not documented    H. zoster vaccine: Not documented  Colorectal Screening   Hemoccult: Not documented   Hemoccult due: Not Indicated    Colonoscopy: Results: Polyp.  Pathology:  Adenomatous polyp.          (04/28/2009)   Colonoscopy action/deferral: Repeat colonoscopy in 5 years.   (04/28/2009)   Colonoscopy due: 04/28/2014  Other Screening   Pap smear: Not documented   Pap smear due: Not Indicated    Mammogram: Assessment: BIRADS 1.   (01/19/2010)   Mammogram due: 01/2011    DXA bone  density scan: Not documented   Smoking status: quit  (01/31/2010)  Diabetes Mellitus   HgbA1C: 11.7  (03/15/2010)   HgbA1C action/deferral: Ordered  (03/15/2010)   Hemoglobin A1C due: 12/02/2008    Eye exam: normal  (02/17/2010)   Eye exam due: 02/2011    Foot exam: yes  (11/25/2009)   High risk foot: Not documented   Foot care education: Not documented    Urine microalbumin/creatinine ratio: Not documented   Urine microalbumin action/deferral: Not indicated    Diabetes flowsheet reviewed?: Yes   Progress toward A1C goal: Unchanged  Lipids   Total Cholesterol: 237  (07/13/2009)   Lipid panel action/deferral: Lipid Panel ordered   LDL: 157  (07/13/2009)   LDL Direct: 203  (02/25/2009)   HDL: 62  (07/13/2009)   Triglycerides: 90  (07/13/2009)    SGOT (AST): 26  (10/05/2009)   BMP action: Ordered   SGPT (ALT): 22  (10/05/2009)   Alkaline phosphatase: 84  (10/05/2009)   Total bilirubin: 0.4  (10/05/2009)    Lipid flowsheet reviewed?: Yes   Progress toward LDL goal: Unchanged  Hypertension   Last Blood Pressure: 154 / 94  (03/15/2010)   Serum creatinine: 1.18  (11/25/2009)   BMP action: Ordered   Serum potassium 4.3  (11/25/2009)    Hypertension flowsheet reviewed?: Yes   Progress toward BP goal: Unchanged  Self-Management Support :   Personal Goals (by the next clinic visit) :     Personal A1C goal: 7  (02/25/2009)     Personal blood pressure goal: 130/80  (02/25/2009)     Personal LDL goal: 100  (02/25/2009)    Diabetes self-management support: Written self-care plan  (12/23/2009)    Hypertension self-management support: Written self-care plan  (12/23/2009)    Lipid self-management support: Written self-care plan  (12/23/2009)    Nursing Instructions: HgbA1C today (see order)   Laboratory Results   Blood Tests   Date/Time Received: March 15, 2010 4:55 PM  Date/Time Reported: March 15, 2010 5:21 PM   HGBA1C: 11.7%   (Normal Range:  Non-Diabetic - 3-6%   Control Diabetic - 6-8%)  Comments: ...............test performed by......Marland KitchenBonnie A. [place]az[place], MLS (ASCP)cm

## 2010-05-25 NOTE — Assessment & Plan Note (Signed)
Summary: BITE ON LEG,DF   Vital Signs:  Patient profile:   65 year old female Height:      65.5 inches Weight:      309.5 pounds BMI:     50.90 Temp:     98.3 degrees F oral Pulse rate:   86 / minute BP sitting:   119 / 77  (left arm) Cuff size:   large  Vitals Entered By: Gladstone Pih (October 05, 2009 10:18 AM) CC: C/O ?  bites on legs Is Patient Diabetic? Yes Did you bring your meter with you today? No Pain Assessment Patient in pain? no        Primary Care Provider:  Dr Leveda Anna  CC:  C/O ?  bites on legs.  History of Present Illness: 1 week of worsening lesions on lower leg (s). Started on left lower leg, re, sore area. Then she got some blisters on her knees---this is usual presentation of her sarcoid flair--last 2 days she has developed reddish sore area right lower leg. Has also had increasing dyspnea last few week. Has not been on steroids in over a year. Sees pulm (Dr Delton Coombes) for this  denies lower extremity edema, chest pains, fever, fatigue, myalgias, arthralgias. No change in mental status  Habits & Providers  Alcohol-Tobacco-Diet     Tobacco Status: quit     Tobacco Counseling: to quit use of tobacco products     Year Quit: 2002  Current Medications (verified): 1)  Humulin 70/30 Pen 70-30 % Susp (Insulin Isophane & Regular) .... 60 Units Each Morning and 60 Units Each Evening.  Disp Qs For 3 Month Supply 2)  Micardis Hct 80-12.5 Mg  Tabs (Telmisartan-Hctz) .... Take 1 Tablet By Mouth Once A Day 3)  Metformin Hcl 1000 Mg Tabs (Metformin Hcl) .... One By Mouth Two Times A Day 4)  Aspirin 81 Mg Tbec (Aspirin) .... One Daily 5)  Gabapentin 300 Mg  Caps (Gabapentin) .... One By Mouth Tid 6)  Furosemide 20 Mg Tabs (Furosemide) .... Take 1 Tab By Mouth Every Day 7)  Crestor 40 Mg Tabs (Rosuvastatin Calcium) .... One By Mouth Daily 8)  Timoptic 0.25 % Soln (Timolol Maleate) .... Two Times A Day Per Optho 9)  Metoprolol Succinate 100 Mg Xr24h-Tab (Metoprolol  Succinate) .... Once A Day 10)  Amlodipine Besylate 10 Mg Tabs (Amlodipine Besylate) .... One By Mouth Daily 11)  Bd Pen Needle Ultrafine 29g X 12.90mm Misc (Insulin Pen Needle) .... Use Twice Daily 12)  Prednisone 20 Mg Tabs (Prednisone) .Marland Kitchen.. 1 By Mouth Qd  Allergies: 1)  ! Penicillin  Past History:  Past Medical History: Last updated: 04/06/2009 Hyperlipidemia Hypertension Sarcoidosis Diabetes  Past Surgical History: Last updated: 05/28/2008 Back surg x 2 in 2004 Hysterectomy  and BSO  and appendectomy for benign disease 1983  Social History: Last updated: 05/28/2008 Non smoker quit in 2001 EtOh social rare Exercise not regularly Diet, not as good as it should be.   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  Review of Systems       see hpi  Physical Exam  General:  overweight-appearing.   Neck:  supple, full ROM, and no masses.   Lungs:  normal breath sounds and no wheezes.   Heart:  normal rate, regular rhythm, and no murmur.   Skin:  L LE medial area above ankle is a 6x4 cm poorly demarcated area of erythema. No streaking. Not raised.  R LE is a fairly well demarcated plaque  like area 3x4 cm erythematous and almost violaceous--feel sindurated.  B knees have small blisters on patella area, hyperpigmented.     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 wil check ACE level ---if this is NOT elevated, then I will rethink this diagnosis.   Will treat with similar steroid dosing Dr Delton Coombes has used in past. f/u with Dr Leveda Anna or me in next few weeks, sooner with new or worsening symptoms. Also needs f/u with Dr Delton Coombes and she agrees to set up  Complete Medication List: 1)  Humulin 70/30 Pen 70-30 % Susp (Insulin isophane &  regular) .... 60 units each morning and 60 units each evening.  disp qs for 3 month supply 2)  Micardis Hct 80-12.5 Mg Tabs (Telmisartan-hctz) .... Take 1 tablet by mouth once a day 3)  Metformin Hcl 1000 Mg Tabs (Metformin hcl) .... One by mouth two times a day 4)  Aspirin 81 Mg Tbec (Aspirin) .... One daily 5)  Gabapentin 300 Mg Caps (Gabapentin) .... One by mouth tid 6)  Furosemide 20 Mg Tabs (Furosemide) .... Take 1 tab by mouth every day 7)  Crestor 40 Mg Tabs (Rosuvastatin calcium) .... One by mouth daily 8)  Timoptic 0.25 % Soln (Timolol maleate) .... Two times a day per optho 9)  Metoprolol Succinate 100 Mg Xr24h-tab (Metoprolol succinate) .... Once a day 10)  Amlodipine Besylate 10 Mg Tabs (Amlodipine besylate) .... One by mouth daily 11)  Bd Pen Needle Ultrafine 29g X 12.81mm Misc (Insulin pen needle) .... Use twice daily 12)  Prednisone 20 Mg Tabs (Prednisone) .Marland Kitchen.. 1 by mouth qd Prescriptions: PREDNISONE 20 MG TABS (PREDNISONE) 1 by mouth qd  #30 x 0   Entered and Authorized by:   Denny Levy MD   Signed by:   Denny Levy MD on 10/05/2009   Method used:   [facility]        [facility] [address] [phone]* (retail)       [address]       [place], [place]  [address]       Ph: [phone] or [phone]       Fax: [phone]   RxID:   [id number]

## 2010-05-25 NOTE — Letter (Signed)
Summary: LAB Letter  [facility]  [address]   [place], [place] [address]   Phone: [phone]  Fax: [phone]    10/07/2009  [facility] [address] [place], [place]  [address]  Dear Ms. Armel,  Your ACE level was at the upper limits of normal. I still think this is a sarcoid flair--the  ACE level was actually not that helpful. If you are not getting some better soon, please see either me or Dr Leveda Anna back.       Sincerely,   Denny Levy MD  Appended Document: LAB Letter MAILED.

## 2010-05-25 NOTE — Assessment & Plan Note (Signed)
Summary: sarcoidosis   Visit Type:  Follow-up Primary Provider/Referring Provider:  Dr Leveda Anna  CC:  Sarcoid follow-up...the patient c/o increased SOB with exertion...stressful situations and talking...worse x2-3 months.  History of Present Illness: 65 yo woman with cutaneous and pulmonary sarcoidosis.   ROV 09/15/08 -- last seen in 04/2007. Now off prednisone for over a year.  Has had some episodic SOB, some worsening of cutaneous lesions on knees, hands. Both breathing and rash are both improving without intervention.   ROV 01/26/10 -- 64 hx sarcoidosis, HTN, DM, obesity. Last seen May 2010. She reports a flare of her sarcoid rash and difficulty breathing this June, treated with pred x 1 month, symptoms improved. Then in August a different kind of dyspnea - ? some volume overload + panic. Eval included TTE w some mild diastolic dysfxn, restarted lasix with improvement. Breathing feels normal unless she gets anxious.     Current Medications (verified): 1)  Humulin 70/30 Pen 70-30 % Susp (Insulin Isophane & Regular) .... 75 Units Each Morning and 60 Units Each Evening.  Disp Qs For 3 Month Supply 2)  Micardis Hct 80-12.5 Mg  Tabs (Telmisartan-Hctz) .... Take 1 Tablet By Mouth Once A Day 3)  Metformin Hcl 1000 Mg Tabs (Metformin Hcl) .... One By Mouth Two Times A Day 4)  Aspirin 81 Mg Tbec (Aspirin) .... One Daily 5)  Gabapentin 300 Mg  Caps (Gabapentin) .... One By Mouth Tid 6)  Furosemide 20 Mg Tabs (Furosemide) .... Take 1 Tab By Mouth Every Day 7)  Crestor 40 Mg Tabs (Rosuvastatin Calcium) .... One By Mouth Daily 8)  Timoptic 0.25 % Soln (Timolol Maleate) .... Two Times A Day Per Optho 9)  Metoprolol Succinate 100 Mg Xr24h-Tab (Metoprolol Succinate) .... Once A Day 10)  Amlodipine Besylate 10 Mg Tabs (Amlodipine Besylate) .... One By Mouth Daily 11)  Bd Pen Needle Ultrafine 29g X 12.76mm Misc (Insulin Pen Needle) .... Use Twice Daily  Allergies (verified): 1)  ! Penicillin  Vital  Signs:  Patient profile:   65 year old female Height:      65.5 inches (166.37 cm) Weight:      309.50 pounds (140.68 kg) BMI:     50.90 O2 Sat:      97 % on Room air Temp:     97.6 degrees F (36.44 degrees C) oral Pulse rate:   77 / minute BP sitting:   148 / 84  (left arm) Cuff size:   large  Vitals Entered By: Michel Bickers CMA (January 26, 2010 9:10 AM)  O2 Sat at Rest %:  97 O2 Flow:  Room air CC: Sarcoid follow-up...the patient c/o increased SOB with exertion...stressful situations and talking...worse x2-3 months Comments Medications reviewed with patient Daytime phone verified. Michel Bickers CMA  January 26, 2010 9:20 AM   Physical Exam  General:  Well-developed,well-nourished,in no acute distress; alert,appropriate and cooperative throughout examination Head:  normocephalic and atraumatic Nose:  no deformity, discharge, inflammation, or lesions Mouth:  no deformity or lesions Neck:  no masses, thyromegaly, or abnormal cervical nodes Chest Wall:  no deformities noted Lungs:  Normal respiratory effort, chest expands symmetrically. Lungs are clear to auscultation, no crackles or wheezes. Heart:  Normal rate and regular rhythm. S1 and S2 normal without gallop, murmur, click, rub or other extra sounds. Abdomen:  not examined Msk:  no deformity or scoliosis noted with normal posture Extremities:  1 +  edema.   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  - CXR today - annual eye exam  Problem # 2:  DIASTOLIC DYSFUNCTION (ICD-429.9) - agree with tight BP control, lasix as ordered.  - agree with stress test as planned by Dr Leveda Anna.   Other Orders: Est. Patient Level IV (99214) T-2 View CXR (71020TC)  Patient Instructions: 1)  CXR today 2)  Proceed with your stress test  as planned.  3)  Get your eye exam every year 4)  Follow up with Dr Delton Coombes in 6 months or as needed

## 2010-05-25 NOTE — Consult Note (Signed)
Summary: [facility]  [facility]   Imported By: Clydell Hakim 12/15/2009 11:17:26  _____________________________________________________________________  External Attachment:    Type:   Image     Comment:   External Document

## 2010-05-25 NOTE — Consult Note (Signed)
Summary: [facility]  [facility]   Imported By: Clydell Hakim 04/29/2009 11:50:45  _____________________________________________________________________  External Attachment:    Type:   Image     Comment:   External Document  Appended Document: [facility] Path report shows two benign tubular adenoma sessile polyps   Clinical Lists Changes  Problems: Removed problem of SPECIAL SCREENING FOR MALIGNANT NEOPLASMS COLON (ICD-V76.51) Added new problem of COLONIC POLYPS, ADENOMATOUS (ICD-211.3) Observations: Added new observation of COLONNXTDUE: 04/28/2014 (05/03/2009 15:58) Added new observation of COLONRECACT: Repeat colonoscopy in 5 years.  (04/28/2009 16:00) Added new observation of COLONOSCOPY: Results: Polyp.  Pathology:  Adenomatous polyp.         (04/28/2009 16:00)       Colonoscopy  Procedure date:  04/28/2009  Findings:      Results: Polyp.  Pathology:  Adenomatous polyp.          Comments:      Repeat colonoscopy in 5 years.    Colonoscopy  Procedure date:  04/28/2009  Findings:      Results: Polyp.  Pathology:  Adenomatous polyp.          Comments:      Repeat colonoscopy in 5 years.     Prevention & Chronic Care Immunizations   Influenza vaccine: Fluvax Non-MCR  (02/25/2009)   Influenza vaccine due: 02/22/2009    Tetanus booster: 02/25/2009: Tdap    Pneumococcal vaccine: Not documented    H. zoster vaccine: Not documented  Colorectal Screening   Hemoccult: Not documented   Hemoccult due: Not Indicated    Colonoscopy: Results: Polyp.  Pathology:  Adenomatous polyp.          (04/28/2009)   Colonoscopy action/deferral: Repeat colonoscopy in 5 years.   (04/28/2009)   Colonoscopy due: 04/28/2014  Other Screening   Pap smear: Not documented   Pap smear due: Not Indicated    Mammogram: Not documented    DXA bone density scan: Not documented   Smoking status: quit  (03/16/2009)  Diabetes Mellitus  HgbA1C: 11.4  (02/25/2009)   HgbA1C action/deferral: Ordered  (02/25/2009)   Hemoglobin A1C due: 12/02/2008    Eye exam: Not documented    Foot exam: Not documented   High risk foot: Not documented   Foot care education: Not documented    Urine microalbumin/creatinine ratio: Not documented   Urine microalbumin action/deferral: Not indicated  Lipids   Total Cholesterol: 305  (06/04/2008)   Lipid panel action/deferral: LDL Direct Ordered   LDL: ZOX=096  (09/03/2008)   LDL Direct: 203  (02/25/2009)   HDL: 81  (06/04/2008)   Triglycerides: 85  (06/04/2008)    SGOT (AST): 16  (02/25/2009)   BMP action: Ordered   SGPT (ALT): 14  (02/25/2009)   Alkaline phosphatase: 74  (02/25/2009)   Total bilirubin: 0.5  (02/25/2009)  Hypertension   Last Blood Pressure: 113 / 74  (04/06/2009)   Serum creatinine: 1.09  (02/25/2009)   BMP action: Ordered   Serum potassium 4.5  (02/25/2009)  Self-Management Support :   Personal Goals (by the next clinic visit) :     Personal A1C goal: 7  (02/25/2009)     Personal blood pressure goal: 130/80  (02/25/2009)     Personal LDL goal: 100  (02/25/2009)    Diabetes self-management support: Written self-care plan  (03/16/2009)    Hypertension self-management support: Written self-care plan  (03/16/2009)    Lipid self-management support: Written self-care plan  (03/16/2009)

## 2010-05-25 NOTE — Assessment & Plan Note (Signed)
Summary: ears "clogged"/[name]/hensel   Vital Signs:  Patient profile:   65 year old female Height:      65.5 inches Weight:      303 pounds BMI:     49.83 Temp:     98.2 degrees F oral BP sitting:   145 /   Vitals Entered By: Jimmy Footman, CMA (January 31, 2010 8:53 AM) CC: both ears feeing clogged and pain x4 days. Has been having a feeling of off balance Is Patient Diabetic? Yes Did you bring your meter with you today? No   Primary Care Provider:  [name] Albino MD  CC:  both ears feeing clogged and pain x4 days. Has been having a feeling of off balance.  History of Present Illness: 66 yo female here with feelings of clogged ears for 4 days and feeling off balance.  No rhinorrhea, no cough, no wheeze, no itchy/watery eyes.no sob, no fevers/chills, no N/V/D/C, no ST, she does have trouble hearing from her right ear.  Symptoms have been persistent.  She denies inserting anything in her ears.  I spent >25 mins with this patient, greater than 50% of this was face to face.  Habits & Providers  Alcohol-Tobacco-Diet     Tobacco Status: quit  Current Medications (verified): 1)  Humulin 70/30 Pen 70-30 % Susp (Insulin Isophane & Regular) .... 75 Units Each Morning and 60 Units Each Evening.  Disp Qs For 3 Month Supply 2)  Micardis Hct 80-12.5 Mg  Tabs (Telmisartan-Hctz) .... Take 1 Tablet By Mouth Once A Day 3)  Metformin Hcl 1000 Mg Tabs (Metformin Hcl) .... One By Mouth Two Times A Day 4)  Aspirin 81 Mg Tbec (Aspirin) .... One Daily 5)  Gabapentin 300 Mg  Caps (Gabapentin) .... One By Mouth Tid 6)  Furosemide 20 Mg Tabs (Furosemide) .... Take 1 Tab By Mouth Every Day 7)  Crestor 40 Mg Tabs (Rosuvastatin Calcium) .... One By Mouth Daily 8)  Timoptic 0.25 % Soln (Timolol Maleate) .... Two Times A Day Per Optho 9)  Metoprolol Succinate 100 Mg Xr24h-Tab (Metoprolol Succinate) .... Once A Day 10)  Amlodipine Besylate 10 Mg Tabs (Amlodipine Besylate) .... One By Mouth Daily 11)  Bd Pen  Needle Ultrafine 29g X 12.40mm Misc (Insulin Pen Needle) .... Use Twice Daily  Allergies (verified): 1)  ! Penicillin  Review of Systems       See HPI  Physical Exam  General:  Well-developed,well-nourished,in no acute distress; alert,appropriate and cooperative throughout examination Head:  Normocephalic and atraumatic without obvious abnormalities. Eyes:  No corneal or conjunctival inflammation noted. EOMI. Perrla.  Ears:  L TM and EAM normal. R TM occluded, EAM filled with a mixture of cotton and cerumen.  No mastoid tenderness. Nose:  External nasal examination shows no deformity or inflammation. Nasal mucosa are pink and moist without lesions or exudates. Mouth:  Oral mucosa and oropharynx without lesions or exudates.   Neck:  No deformities, masses, or tenderness noted. Additional Exam:  We spent an extended amount of time trying to clear her R EAM of this debris.  Eventually after extended flushing by nurse and then by physician along with curettage, a copious amount of cotton and wax was removed and a normal TM was visible.  Pt endorsed immediate resolution of symptoms and greatly improved hearing.   Impression & Recommendations:  Problem # 1:  CERUMEN IMPACTION, RIGHT (ICD-380.4) Assessment New Removed cerumen and cotton. Pt to fu as needed.  Orders: Cerumen Impaction Removal-[facility] (16109) [facility]-  Est  Level 4 (99214)  Complete Medication List: 1)  Humulin 70/30 Pen 70-30 % Susp (Insulin isophane & regular) .... 75 units each morning and 60 units each evening.  disp qs for 3 month supply 2)  Micardis Hct 80-12.5 Mg Tabs (Telmisartan-hctz) .... Take 1 tablet by mouth once a day 3)  Metformin Hcl 1000 Mg Tabs (Metformin hcl) .... One by mouth two times a day 4)  Aspirin 81 Mg Tbec (Aspirin) .... One daily 5)  Gabapentin 300 Mg Caps (Gabapentin) .... One by mouth tid 6)  Furosemide 20 Mg Tabs (Furosemide) .... Take 1 tab by mouth every day 7)  Crestor 40 Mg Tabs (Rosuvastatin  calcium) .... One by mouth daily 8)  Timoptic 0.25 % Soln (Timolol maleate) .... Two times a day per optho 9)  Metoprolol Succinate 100 Mg Xr24h-tab (Metoprolol succinate) .... Once a day 10)  Amlodipine Besylate 10 Mg Tabs (Amlodipine besylate) .... One by mouth daily 11)  Bd Pen Needle Ultrafine 29g X 12.4mm Misc (Insulin pen needle) .... Use twice daily

## 2010-05-25 NOTE — Assessment & Plan Note (Signed)
Vital Signs:  Patient profile:   65 year old female Weight:      304.5 pounds BMI:     50.08 Temp:     97.96 degrees F Pulse rate:   71 / minute BP sitting:   154 / 97  (left arm)  Vitals Entered By: Loralee Pacas CMA (July 01, 2009 1:29 PM)  Primary Care Provider:  Dr Leveda Anna   History of Present Illness: BP up today.  Home BPs are also up.  Feeling pain below waist.  Pain is in the bilateral groin region, Lt>Rt.  Also, bilateral leg weakness.  Pain is quite severe.  Frequent urinartion.  No dysuria, but perhaps some urinary irritation.  Taking metamucil for diarrhea.  Diarrhea and groin pain seem to be seperate problems.  Has known neuropathy - possibly related to back surgery.    Chronic back problems, had epidural steroids last summer.  Back seems to be at a stable point and she doubts the back is the problem.    BS are high but better  Cholesterol needs recheck now that she is on Crestor.  Also, check CK with her back and leg pain.  She knows she is due for a mammogram.  Has not yet made the appointment.  Current Medications (verified): 1)  Humulin 70/30 Pen 70-30 % Susp (Insulin Isophane & Regular) .... 60 Units Each Morning and 60 Units Each Evening.  Disp Qs For 3 Month Supply 2)  Micardis Hct 80-12.5 Mg  Tabs (Telmisartan-Hctz) .... Take 1 Tablet By Mouth Once A Day 3)  Metformin Hcl 1000 Mg Tabs (Metformin Hcl) .... One By Mouth Two Times A Day 4)  Aspirin 81 Mg Tbec (Aspirin) .... One Daily 5)  Gabapentin 300 Mg  Caps (Gabapentin) .... One By Mouth Tid 6)  Furosemide 20 Mg Tabs (Furosemide) .... Take 1 Tab By Mouth Every Day 7)  Crestor 40 Mg Tabs (Rosuvastatin Calcium) .... One By Mouth Daily 8)  Timoptic 0.25 % Soln (Timolol Maleate) .... Two Times A Day Per Optho 9)  Metoprolol Succinate 100 Mg Xr24h-Tab (Metoprolol Succinate) .... Once A Day 10)  Amlodipine Besylate 10 Mg Tabs (Amlodipine Besylate) .... One By Mouth Daily 11)  Bd Pen Needle Ultrafine 29g X  12.45mm Misc (Insulin Pen Needle) .... Use Twice Daily  Allergies (verified): 1)  ! Penicillin  Past History:  Past medical, surgical, family and social histories (including risk factors) reviewed, and no changes noted (except as noted below).  Past Medical History: Reviewed history from 04/06/2009 and no changes required. Hyperlipidemia Hypertension Sarcoidosis Diabetes  Past Surgical History: Reviewed history from 05/28/2008 and no changes required. Back surg x 2 in 2004 Hysterectomy  and BSO  and appendectomy for benign disease 1983  Family History: Reviewed history from 05/28/2008 and no changes required. + HBP, DM, CAD, CA, CVA, alcoholism  Social History: Reviewed history from 05/28/2008 and no changes required. Non smoker quit in 2001 EtOh social rare Exercise not regularly Diet, not as good as it should be.   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  Review of Systems  The patient denies anorexia, fever, chest pain, syncope, and dyspnea on exertion.    Physical Exam  General:  Well-developed,well-nourished,in no acute distress; alert,appropriate and cooperative throughout examination Neck:  No deformities, masses, or tenderness noted. Lungs:  Normal respiratory effort, chest expands symmetrically. Lungs are clear to auscultation, no crackles or wheezes. Heart:  Normal rate and regular rhythm. S1 and S2 normal without  gallop, murmur, click, rub or other extra sounds. Extremities:  Good ROM of hip  Diabetes Management Exam:    Foot Exam (with socks and/or shoes not present):       Sensory-Monofilament:          Left foot: diminished          Right foot: diminished       Inspection:          Left foot: normal          Right foot: normal       Nails:          Left foot: normal          Right foot: normal   Impression & Recommendations:  Problem # 1:  HYPERTENSION (ICD-401.9) Assessment Unchanged Will increase Beta blocker and focus on life style  changes. Her updated medication list for this problem includes:    Micardis Hct 80-12.5 Mg Tabs (Telmisartan-hctz) .Marland Kitchen... Take 1 tablet by mouth once a day    Furosemide 20 Mg Tabs (Furosemide) .Marland Kitchen... Take 1 tab by mouth every day    Metoprolol Succinate 100 Mg Xr24h-tab (Metoprolol succinate) ..... Once a day    Amlodipine Besylate 10 Mg Tabs (Amlodipine besylate) ..... One by mouth daily  Orders: [facility]- Est  Level 4 (99214)Future Orders: Basic Met-[facility] ([id number]) ... 06/22/2010  Problem # 2:  HYPERLIPIDEMIA (ICD-272.4) Need fasting labs Her updated medication list for this problem includes:    Crestor 40 Mg Tabs (Rosuvastatin calcium) ..... One by mouth daily  Orders: [facility]- Est  Level 4 (99214)Future Orders: T-Lipid Profile ([id number]) ... 06/22/2010 CK (Creatine Kinase)-[facility] [id number]) ... 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  disp qs for 3 month supply    Micardis Hct 80-12.5 Mg Tabs (Telmisartan-hctz) .Marland Kitchen... Take 1 tablet by mouth once a day    Metformin Hcl 1000 Mg Tabs (Metformin hcl) ..... One by mouth two times a day    Aspirin 81 Mg Tbec (Aspirin) ..... One daily  Orders: [facility]- Est  Level 4 (64403)  Problem # 4:  DIABETES MELLITUS (ICD-250.00) Improved but still poor control.  Given large dose of insulin, will focus on life style changes. Her updated medication list for this problem includes:    Humulin 70/30 Pen 70-30 % Susp (Insulin isophane & regular) .Marland KitchenMarland KitchenMarland KitchenMarland Kitchen 60 units each morning and 60 units each evening.  disp qs for 3 month supply    Micardis Hct 80-12.5 Mg Tabs (Telmisartan-hctz) .Marland Kitchen... Take 1 tablet by mouth once a day    Metformin Hcl 1000 Mg Tabs (Metformin hcl) ..... One by  mouth two times a day    Aspirin 81 Mg Tbec (Aspirin) ..... One daily  Orders: A1C-[facility] (47425) [facility]- Est  Level 4 (95638)  Complete Medication List: 1)  Humulin 70/30 Pen 70-30 % Susp (Insulin isophane & regular) .... 60 units each morning and 60 units each evening.  disp qs for 3 month supply 2)  Micardis Hct 80-12.5 Mg Tabs (Telmisartan-hctz) .... Take 1 tablet by mouth once a day 3)  Metformin Hcl 1000 Mg Tabs (Metformin hcl) .... One by mouth two times a day 4)  Aspirin 81 Mg Tbec (Aspirin) .Marland KitchenMarland KitchenMarland Kitchen  One daily 5)  Gabapentin 300 Mg Caps (Gabapentin) .... One by mouth tid 6)  Furosemide 20 Mg Tabs (Furosemide) .... Take 1 tab by mouth every day 7)  Crestor 40 Mg Tabs (Rosuvastatin calcium) .... One by mouth daily 8)  Timoptic 0.25 % Soln (Timolol maleate) .... Two times a day per optho 9)  Metoprolol Succinate 100 Mg Xr24h-tab (Metoprolol succinate) .... Once a day 10)  Amlodipine Besylate 10 Mg Tabs (Amlodipine besylate) .... One by mouth daily 11)  Bd Pen Needle Ultrafine 29g X 12.27mm Misc (Insulin pen needle) .... Use twice daily  Other Orders: Urinalysis-[facility] (00000)  Patient Instructions: 1)  Start taking 2 of the metoprolol daily until you get your new prescription. 2)  Take the gabapentin three times a day for your neuropathy. Prescriptions: METOPROLOL SUCCINATE 100 MG XR24H-TAB (METOPROLOL SUCCINATE) once a day  #90 x 3   Entered and Authorized by:   [name] Albino MD   Signed by:   Bonnie Swaziland on 07/01/2009   Method used:   Electronically to        [facility]* (mail-order)             ,          Ph: [phone]       Fax: [phone]   RxID:   [id number] BD PEN NEEDLE ULTRAFINE 29G X 12.7MM MISC (INSULIN PEN NEEDLE) Use twice daily  #180 x 3   Entered and Authorized by:   [name] Albino MD   Signed by:   Bonnie Swaziland on 07/01/2009   Method used:   Electronically to        [facility]* (mail-order)             ,          Ph: [phone]       Fax:  [phone]   RxID:   [id number] AMLODIPINE BESYLATE 10 MG TABS (AMLODIPINE BESYLATE) one by mouth daily  #90 x 3   Entered and Authorized by:   [name] Albino MD   Signed by:   Bonnie Swaziland on 07/01/2009   Method used:   Electronically to        [facility]* (mail-order)             ,          Ph: [phone]       Fax: [phone]   RxID:   [id number] MICARDIS HCT 80-12.5 MG  TABS (TELMISARTAN-HCTZ) Take 1 tablet by mouth once a day  #90 x 3   Entered and Authorized by:   [name] Albino MD   Signed by:   Bonnie Swaziland on 07/01/2009   Method used:   Electronically to        [facility]* (mail-order)             ,          Ph: [phone]       Fax: [phone]   RxID:   [id number]    Prevention & Chronic Care Immunizations   Influenza vaccine: Fluvax Non-MCR  (02/25/2009)   Influenza vaccine due: 02/22/2009    Tetanus booster: 02/25/2009: Tdap    Pneumococcal vaccine: Not documented    H. zoster vaccine: Not documented  Colorectal Screening   Hemoccult: Not documented   Hemoccult due: Not Indicated    Colonoscopy: Results: Polyp.  Pathology:  Adenomatous polyp.          (04/28/2009)   Colonoscopy action/deferral: Repeat  colonoscopy in 5 years.   (04/28/2009)   Colonoscopy due: 04/28/2014  Other Screening   Pap smear: Not documented   Pap smear due: Not Indicated    Mammogram: Not documented    DXA bone density scan: Not documented   Smoking status: quit  (03/16/2009)  Diabetes Mellitus   HgbA1C: 10.5  (07/01/2009)   HgbA1C action/deferral: Ordered  (02/25/2009)   Hemoglobin A1C due: 12/02/2008    Eye exam: Not documented    Foot exam: yes  (07/01/2009)   High risk foot: Not documented   Foot care education: Not documented    Urine microalbumin/creatinine ratio: Not documented   Urine microalbumin action/deferral: Not indicated    Diabetes flowsheet reviewed?: Yes   Progress toward A1C goal: Improved  Lipids   Total  Cholesterol: 305  (06/04/2008)   Lipid panel action/deferral: Lipid Panel ordered   LDL: LDL=162  (09/03/2008)   LDL Direct: 203  (02/25/2009)   HDL: 81  (06/04/2008)   Triglycerides: 85  (06/04/2008)    SGOT (AST): 16  (02/25/2009)   BMP action: Ordered   SGPT (ALT): 14  (02/25/2009)   Alkaline phosphatase: 74  (02/25/2009)   Total bilirubin: 0.5  (02/25/2009)    Lipid flowsheet reviewed?: Yes   Progress toward LDL goal: Unchanged  Hypertension   Last Blood Pressure: 154 / 97  (07/01/2009)   Serum creatinine: 1.09  (02/25/2009)   BMP action: Ordered   Serum potassium 4.5  (02/25/2009)    Hypertension flowsheet reviewed?: Yes   Progress toward BP goal: Deteriorated  Self-Management Support :   Personal Goals (by the next clinic visit) :     Personal A1C goal: 7  (02/25/2009)     Personal blood pressure goal: 130/80  (02/25/2009)     Personal LDL goal: 100  (02/25/2009)    Diabetes self-management support: Written self-care plan  (07/01/2009)   Diabetes care plan printed    Hypertension self-management support: Written self-care plan  (07/01/2009)   Hypertension self-care plan printed.    Lipid self-management support: Written self-care plan  (07/01/2009)   Lipid self-care plan printed.   Laboratory Results   Urine Tests  Date/Time Received: July 01, 2009 2:04 PM  Date/Time Reported: July 01, 2009 2:07 PM   Routine Urinalysis   Color: yellow Appearance: Clear Glucose: negative   (Normal Range: Negative) Bilirubin: negative   (Normal Range: Negative) Ketone: negative   (Normal Range: Negative) Spec. Gravity: 1.020   (Normal Range: 1.003-1.035) Blood: negative   (Normal Range: Negative) pH: 5.5   (Normal Range: 5.0-8.0) Protein: trace   (Normal Range: Negative) Urobilinogen: 0.2   (Normal Range: 0-1) Nitrite: negative   (Normal Range: Negative) Leukocyte Esterace: negative   (Normal Range: Negative)    Comments: ...........test performed  by...........Marland KitchenTerese Door, CMA   Blood Tests   Date/Time Received: July 01, 2009 1:33 PM  Date/Time Reported: July 01, 2009 2:03 PM   HGBA1C: 10.5%   (Normal Range: Non-Diabetic - 3-6%   Control Diabetic - 6-8%)  Comments: ...............test performed by......Marland KitchenBonnie A. Swaziland, MLS (ASCP)cm       Laboratory Results   Urine Tests    Routine Urinalysis   Color: yellow Appearance: Clear Glucose: negative   (Normal Range: Negative) Bilirubin: negative   (Normal Range: Negative) Ketone: negative   (Normal Range: Negative) Spec. Gravity: 1.020   (Normal Range: 1.003-1.035) Blood: negative   (Normal Range: Negative) pH: 5.5   (Normal Range: 5.0-8.0) Protein: trace   (Normal Range: Negative) Urobilinogen: 0.2   (  Normal Range: 0-1) Nitrite: negative   (Normal Range: Negative) Leukocyte Esterace: negative   (Normal Range: Negative)    Comments: ...........test performed by...........Marland KitchenTerese Door, CMA   Blood Tests     HGBA1C: 10.5%   (Normal Range: Non-Diabetic - 3-6%   Control Diabetic - 6-8%)  Comments: ...............test performed by......Marland KitchenBonnie A. Swaziland, MLS (ASCP)cm

## 2010-08-25 ENCOUNTER — Encounter: Payer: Self-pay | Admitting: Family Medicine

## 2010-08-25 ENCOUNTER — Ambulatory Visit (INDEPENDENT_AMBULATORY_CARE_PROVIDER_SITE_OTHER): Payer: 59 | Admitting: Family Medicine

## 2010-08-25 VITALS — BP 164/89 | HR 67 | Temp 98.0°F | Ht 66.0 in | Wt 305.0 lb

## 2010-08-25 DIAGNOSIS — E119 Type 2 diabetes mellitus without complications: Secondary | ICD-10-CM

## 2010-08-25 DIAGNOSIS — E669 Obesity, unspecified: Secondary | ICD-10-CM

## 2010-08-25 DIAGNOSIS — I10 Essential (primary) hypertension: Secondary | ICD-10-CM

## 2010-08-25 DIAGNOSIS — E785 Hyperlipidemia, unspecified: Secondary | ICD-10-CM

## 2010-08-25 LAB — POCT GLYCOSYLATED HEMOGLOBIN (HGB A1C): Hemoglobin A1C: 12.2

## 2010-08-25 MED ORDER — PSYLLIUM 28 % PO PACK: 1.0000 | PACK | Freq: Two times a day (BID) | ORAL | Status: DC

## 2010-08-25 NOTE — Assessment & Plan Note (Signed)
Will recheck labs one month after weight watchers.

## 2010-08-25 NOTE — Progress Notes (Signed)
  Subjective:    Patient ID: Rebecca Hicks, female    DOB: October 09, 1945, 65 y.o.   MRN: [id number]  HPI  Rebecca Hicks admits that she has been terrible about compliance due to this past 5 months being one of the highest stress times of her life.  Several family deaths and issues.  Work issues.  Finally had family meeting and ended her matriarchal care giving.  She is now ready to take care of herself.   Has strong family history of early death due to stroke, MI and cancer. Wants to return to weight watchers.  She has successfully lost weight in the past.     Review of Systems Some mild SOB on exercision No CP      Objective:   Physical Exam Neck no masses Lungs clear Cardiac RRR without murmur        Assessment & Plan:

## 2010-08-25 NOTE — Assessment & Plan Note (Signed)
Given Rx for wt watchers.  She has used successfully in the past and wants to return.

## 2010-08-25 NOTE — Assessment & Plan Note (Signed)
Worse control.  Vows to take meds and start wt watchers. 

## 2010-08-25 NOTE — Assessment & Plan Note (Signed)
Worse control.  Vows to take meds and start wt watchers.

## 2010-09-08 NOTE — Op Note (Signed)
NAME:  Rebecca Hicks, DARDEN NO.:  [id number]   MEDICAL RECORD NO.:  [id number]                   PATIENT TYPE:  INP   LOCATION:  3008                                 FACILITY:  [facility]   PHYSICIAN:  Coletta Memos, M.D.                  DATE OF BIRTH:  1945/06/23   DATE OF PROCEDURE:  02/19/2003  DATE OF DISCHARGE:                                 OPERATIVE REPORT   PREOPERATIVE DIAGNOSIS:  Recurrent displaced disk L5-S1 left.   POSTOPERATIVE DIAGNOSIS:  Recurrent displaced disk L5-S1 left.   PROCEDURE:  Left L5-S1 redo diskectomy with microdissection.   SURGEON:  Coletta Memos, M.D.   ASSISTANT:  Hilda Lias, M.D.   COMPLICATIONS:  None.   INDICATIONS FOR PROCEDURE:  The patient is a patient who I took to the  operating room for herniated disk at L5-S1 on the left side.  Postoperatively, she never had much in the way of improvement.  I therefore  ordered a repeat MRI scan which showed what I believed to be a recurrent  disk herniation.  I therefore recommended and she agreed to undergo  operative removal of the fragment.   DESCRIPTION OF PROCEDURE:  The patient was taken to the operating room,  intubated, and placed under general anesthesia without difficulty.  She was  rolled prone onto a Wilson frame and all pressure points were properly  padded.  Using old incision as a guide, I opened that and took this down to  the thoracolumbar fascia.  The patient is morbidly obese and the dissection  was extremely difficult.  Using extra long instruments, I was able to  finally localize the lamina.  This lamina was L5.  This was confirmed by x-  ray.  I then exposed the intralaminar space between L5 and S1.  I was able  to retract the thecal sac medially, but in the process did open a rent in  the dura.  The arachnoid, however, was still intact.  The dural sac remained  full throughout the case.  With Dr. Cassandria Santee assistance and the microscope  to aid in  microdissection, we emptied the disk space.  I did not encounter a  large fragment.  I did not see a large piece of anything in the canal.  I  did remove some scar tissue and some disk material that was in the disk  space, but the MRI and my operation I would not say were congruent.  I did a  very thorough inspection with nerve hooks looking both above and below and  medially to see if there was extra disk material.  I did not appreciate  anything left behind.  I therefore irrigated the wound. I then placed a  small piece of Duragen and then a piece of fat and then to seal over the  dural opening with the intact arachnoid.  I then closed the wound in layered  fashion using Vicryl sutures.  The subcutaneous tissue reapproximated, the  subcuticular layer reapproximated, and then finally Dermabond used for a  sterile dressing.                                               Coletta Memos, M.D.    KC/MEDQ  D:  02/19/2003  T:  02/19/2003  Job:  [id number]

## 2010-09-08 NOTE — Op Note (Signed)
NAME:  OLIVEAH, ZWACK                       ACCOUNT NO.:  [id number]   MEDICAL RECORD NO.:  [id number]                   PATIENT TYPE:  INP   LOCATION:  3011                                 FACILITY:  [facility]   PHYSICIAN:  Coletta Memos, M.D.                  DATE OF BIRTH:  [birth year]   DATE OF PROCEDURE:  12/09/2002  DATE OF DISCHARGE:  12/10/2002                                 OPERATIVE REPORT   PREOPERATIVE DIAGNOSES:  1. Displaced disk left L5-S1.  2. Lumbar radiculopathy.   POSTOPERATIVE DIAGNOSES:  1. Displaced disk left L5-S1.  2. Lumbar radiculopathy.   PROCEDURE:  Left L5-S1 lumbar semi hemilaminectomy and diskectomy with  microdissection.   COMPLICATIONS:  None.   SURGEON:  Coletta Memos, M.D.   ASSISTANT:  Hilda Lias, M.D.   INDICATIONS FOR PROCEDURE:  Emrey Thornley is a 65 year old woman who  presented with pain in the left lower extremity. She had a what appeared  to  be a large disk  herniation. I therefore recommended and she agreed to  undergo operative decompression.   DESCRIPTION OF PROCEDURE:  Mrs. Bach was brought to the operating room,  intubated and placed under general anesthesia. She was rolled prone onto the  Wilson frame and all pressure points were properly padded. The skin was  prepped and she was draped in a sterile fashion. I infiltrated 20 mL of 0.5%  Marcaine with 1:200,000 strength epinephrine into the lumbar region.   I opened the skin with a #10 blade and took this down to the thoracolumbar  fascia sharply. I then placed a self-retaining retractor and exposed the  lamina of what I believed to be L5 and S1. I took an interoperative x-ray  and I was in the correct intralaminar space.   I then removed the ligament of Flavum between L5 and S1 and exposed the  thecal sac. I was able to retract that medially and I identified  the disk  herniation. It was both soft disk  and a large component was calcified. The  calcification was  removed in a piecemeal fashion with Dr. Cassandria Santee  assistance  and microdissection. The soft disk  was removed in the same  manner using pituitary rongeurs, Kerrison punches and Epstein curets.   After adequately decompressing  the nerve root, I inspected the S1 nerve  root and L5. I did not believe  that there was any more compression. I then  irrigated the wound copiously. I then reapproximated the thoracolumbar  fascia  with Vicryl sutures, the subcutaneous tissue with Vicryl sutures and finally  the subcuticular layer with Vicryl sutures. Dermabond was used for a sterile  dressing.   Mrs. Hy was rolled supine. She was extubated and moving all  extremities.  Coletta Memos, M.D.    KC/MEDQ  D:  12/11/2002  T:  12/13/2002  Job:  [id number]

## 2010-09-08 NOTE — Discharge Summary (Signed)
NAME:  Rebecca Hicks, Rebecca Hicks NO.:  [id number]   MEDICAL RECORD NO.:  [id number]                   PATIENT TYPE:  INP   LOCATION:  3008                                 FACILITY:  [facility]   PHYSICIAN:  Hilda Lias, M.D.                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   CLINICAL HISTORY:  Ms. Rother is a patient of Dr. Franky Macho who was admitted  because of back pain with radiation to the left leg.  The patient had  similar problems in the past.  X-rays showed that she has a recurrent  herniated disk at the level of 5-1 on the left side.   LABORATORY DATA:  Normal.   HOSPITAL COURSE:  The patient was taken to surgery, and diskectomy with  removal of recurrent fragment was done.  Lysis of adhesions was done.  The  patient did well and was officially discharged on October 31.  Nevertheless,  the patient was slow to go home, and she was eventually discharged on  November 1.   CONDITION ON DISCHARGE:  Improvement.   DISCHARGE MEDICATIONS:  Percocet and Flexeril for pain.   DIET:  Regular.   ACTIVITY:  She is not to drive until she sees Dr. Franky Macho.   FOLLOW UP:  The patient is to call Dr. Franky Macho for followup in four weeks.                                                Hilda Lias, M.D.    EB/MEDQ  D:  04/15/2003  T:  04/17/2003  Job:  [id number]

## 2010-09-22 ENCOUNTER — Other Ambulatory Visit: Payer: 59

## 2010-09-29 ENCOUNTER — Other Ambulatory Visit: Payer: 59

## 2010-09-29 DIAGNOSIS — E785 Hyperlipidemia, unspecified: Secondary | ICD-10-CM

## 2010-09-29 LAB — LIPID PANEL
HDL: 54 mg/dL (ref >39)
LDL Cholesterol: 119 mg/dL — ABNORMAL HIGH (ref 0–99)

## 2010-09-29 NOTE — Progress Notes (Signed)
cmp and flp done today Rebecca Hicks 

## 2010-09-30 ENCOUNTER — Other Ambulatory Visit: Payer: Self-pay | Admitting: Family Medicine

## 2010-09-30 LAB — COMPLETE METABOLIC PANEL WITH GFR
ALT: 14 U/L (ref 0–35)
Alkaline Phosphatase: 78 U/L (ref 39–117)
CO2: 25 mEq/L (ref 19–32)
Creat: 1.01 mg/dL (ref 0.50–1.10)
GFR, Est African American: >60 mL/min (ref >60)
Total Bilirubin: 0.5 mg/dL (ref 0.3–1.2)

## 2010-10-02 ENCOUNTER — Other Ambulatory Visit: Payer: Self-pay | Admitting: Family Medicine

## 2010-10-02 MED ORDER — METFORMIN HCL 1000 MG PO TABS: 1000.0000 mg | ORAL_TABLET | Freq: Two times a day (BID) | ORAL | Status: DC

## 2010-10-02 NOTE — Telephone Encounter (Signed)
Refill request

## 2010-10-02 NOTE — Telephone Encounter (Signed)
Refilled via fax request from mail order.

## 2010-10-03 ENCOUNTER — Encounter: Payer: Self-pay | Admitting: Family Medicine

## 2010-10-03 ENCOUNTER — Telehealth: Payer: Self-pay | Admitting: Family Medicine

## 2010-10-03 ENCOUNTER — Ambulatory Visit (INDEPENDENT_AMBULATORY_CARE_PROVIDER_SITE_OTHER): Payer: 59 | Admitting: Family Medicine

## 2010-10-03 VITALS — BP 155/83 | HR 80 | Temp 98.1°F | Ht 66.0 in | Wt 299.0 lb

## 2010-10-03 DIAGNOSIS — J019 Acute sinusitis, unspecified: Secondary | ICD-10-CM | POA: Insufficient documentation

## 2010-10-03 HISTORY — DX: Acute sinusitis, unspecified: J01.90

## 2010-10-03 MED ORDER — DOXYCYCLINE HYCLATE 100 MG PO TABS: 100.0000 mg | ORAL_TABLET | Freq: Two times a day (BID) | ORAL | Status: AC

## 2010-10-03 NOTE — Telephone Encounter (Signed)
Received call from [facility] pharmacy stating patient has not gotten Crestor 40 mg filled thru them since Feb 13, 2010. They  wanted to make the doctor  aware and need a call back when this has been addressed.  Reference # [id number]. Will forward to MD.

## 2010-10-03 NOTE — Telephone Encounter (Signed)
Needs to speak with someone about Crestor- they need drug therapy clarification.

## 2010-10-03 NOTE — Patient Instructions (Signed)
Return or call for high fever, or not getting better Expect 7-10 days from onset before you feel better

## 2010-10-03 NOTE — Progress Notes (Signed)
  Subjective:    Patient ID: Rebecca Hicks, female    DOB: [birth year], 65 y.o.   MRN: [id number]  HPI Cough, sinus drainage, feeling ill with chills for 6 days, getting worse. Comorbid conditions of pulmonary sarcoid and poorly controlled diabetes.  She is using OTC cough suppressant.  May be a little more short of breath on exertion, she is not wheezing.    Review of Systems  Constitutional: Positive for chills. Negative for fever.  HENT: Positive for congestion and postnasal drip.   Respiratory: Positive for cough.   Cardiovascular: Negative for chest pain and leg swelling.  Gastrointestinal: Negative for nausea.  Skin: Negative for rash.       Objective:   Physical Exam  Constitutional:       Alert, morbidly obese AA female  HENT:  Right Ear: External ear normal.  Left Ear: External ear normal.       Thick, yellow post nasal discharge  Cardiovascular: Normal rate, regular rhythm and normal heart sounds.   Pulmonary/Chest: Effort normal and breath sounds normal. She has no wheezes. She has no rales.  Lymphadenopathy:    She has no cervical adenopathy.  Skin: No rash noted.          Assessment & Plan:

## 2010-10-03 NOTE — Assessment & Plan Note (Signed)
Likely viral, in immunocompromised patient will treat with 7 days of doxycycline.

## 2010-10-04 ENCOUNTER — Telehealth: Payer: Self-pay | Admitting: *Deleted

## 2010-10-04 MED ORDER — ROSUVASTATIN CALCIUM 40 MG PO TABS: 40.0000 mg | ORAL_TABLET | Freq: Every day | ORAL | Status: DC

## 2010-10-04 NOTE — Telephone Encounter (Signed)
Notified [facility] that Dr. Leveda Anna has contacted patient and she is taking Crestor and needs refill now.

## 2010-10-04 NOTE — Telephone Encounter (Signed)
Called patient and clarified.  She is taking and needs a refill.  Recent lipid panel was on Crestor.

## 2010-12-07 ENCOUNTER — Other Ambulatory Visit: Payer: Self-pay | Admitting: Orthopedic Surgery

## 2010-12-07 DIAGNOSIS — M545 Low back pain, unspecified: Secondary | ICD-10-CM

## 2010-12-07 DIAGNOSIS — M48061 Spinal stenosis, lumbar region without neurogenic claudication: Secondary | ICD-10-CM

## 2010-12-12 ENCOUNTER — Ambulatory Visit
Admission: RE | Admit: 2010-12-12 | Discharge: 2010-12-12 | Disposition: A | Discharge status: Home / Self Care | Payer: 59 | Source: Ambulatory Visit | Attending: Orthopedic Surgery | Admitting: Orthopedic Surgery

## 2010-12-12 DIAGNOSIS — M545 Low back pain, unspecified: Secondary | ICD-10-CM

## 2010-12-12 DIAGNOSIS — M48061 Spinal stenosis, lumbar region without neurogenic claudication: Secondary | ICD-10-CM

## 2010-12-12 MED ORDER — METHYLPREDNISOLONE ACETATE 40 MG/ML INJ SUSP (RADIOLOG
120.0000 mg | Freq: Once | INTRAMUSCULAR | Status: AC
  Administered 2010-12-12: 120 mg via EPIDURAL

## 2010-12-12 MED ORDER — IOHEXOL 180 MG/ML  SOLN
1.0000 mL | Freq: Once | INTRAMUSCULAR | Status: AC | PRN
  Administered 2010-12-12: 1 mL via EPIDURAL

## 2011-01-24 ENCOUNTER — Ambulatory Visit (INDEPENDENT_AMBULATORY_CARE_PROVIDER_SITE_OTHER): Payer: 59 | Admitting: Emergency Medicine

## 2011-01-24 ENCOUNTER — Encounter: Payer: Self-pay | Admitting: Emergency Medicine

## 2011-01-24 VITALS — BP 140/78 | HR 80 | Temp 97.9°F | Ht 66.0 in | Wt 307.3 lb

## 2011-01-24 DIAGNOSIS — D869 Sarcoidosis, unspecified: Secondary | ICD-10-CM

## 2011-01-24 NOTE — Patient Instructions (Signed)
Please schedule CT scan chest  Follow up with Dr Delton Coombes after CT scan to plan our next steps and treatment

## 2011-01-24 NOTE — Progress Notes (Signed)
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   ROV 09/15/08 -- last seen in 04/2007. Now off prednisone for over a year. Has had some episodic SOB, some worsening of cutaneous lesions on knees, hands. Both breathing and rash are both improving without intervention.   ROV 01/26/10 -- 64 hx sarcoidosis, HTN, DM, obesity. Last seen May 2010. She reports a flare of her sarcoid rash and difficulty breathing this June, treated with pred x 1 month, symptoms improved. Then in August a different kind of dyspnea - ? some volume overload + panic. Eval included TTE w some mild diastolic dysfxn, restarted lasix with improvement. Breathing feels normal unless she gets anxious.  ROV 01/24/11 -- 65 yo woman, sarcoidosis, HTN, obesity.  Returns today, describes some skin sarcoid on legs last 2 months. Treated with a stronger steroid cream w some resolution. Then over last month more exertional SOB, for example when walking across parking lot. She has lost some wt compared w last visit, has done some wt watchers.   Gen: Pleasant, obese, in no distress,  normal affect  ENT: No lesions,  mouth clear,  oropharynx clear, no postnasal drip  Neck: No JVD, no TMG, no carotid bruits  Lungs: No use of accessory muscles, no dullness to percussion, clear without rales or rhonchi  Cardiovascular: RRR, heart sounds normal, no murmur or gallops, no peripheral edema  Musculoskeletal: No deformities, no cyanosis or clubbing  Neuro: alert, non focal  Skin: Warm, R LE with resolving dark bumpy rash  SARCOIDOSIS, PULMONARY - CT scan of the chest to compare w 2007 - probably will need to rx acute sarcoid flare with pred - rov after the CT to review and to plan treatment.

## 2011-01-24 NOTE — Assessment & Plan Note (Signed)
-   CT scan of the chest to compare w 2007 - probably will need to rx acute sarcoid flare with pred - rov after the CT to review and to plan treatment.

## 2011-01-25 LAB — DIFFERENTIAL
Basophils Relative: 0 % (ref 0–1)
Eosinophils Relative: 1 % (ref 0–5)
Monocytes Absolute: 0.6 10*3/uL (ref 0.1–1.0)
Monocytes Relative: 8 % (ref 3–12)
Neutro Abs: 5.7 10*3/uL (ref 1.7–7.7)

## 2011-01-25 LAB — URINALYSIS, ROUTINE W REFLEX MICROSCOPIC
Bilirubin Urine: NEGATIVE
Hgb urine dipstick: NEGATIVE
Ketones, ur: NEGATIVE mg/dL
Specific Gravity, Urine: 1.02 (ref 1.005–1.030)
Urobilinogen, UA: 0.2 mg/dL (ref 0.0–1.0)

## 2011-01-25 LAB — CBC
HCT: 41.9 % (ref 36.0–46.0)
Hemoglobin: 13.8 g/dL (ref 12.0–15.0)
MCHC: 32.9 g/dL (ref 30.0–36.0)
MCV: 89.9 fL (ref 78.0–100.0)
RBC: 4.66 MIL/uL (ref 3.87–5.11)

## 2011-01-26 ENCOUNTER — Ambulatory Visit (INDEPENDENT_AMBULATORY_CARE_PROVIDER_SITE_OTHER)
Admission: RE | Admit: 2011-01-26 | Discharge: 2011-01-26 | Disposition: A | Discharge status: Home / Self Care | Payer: 59 | Source: Ambulatory Visit | Attending: Emergency Medicine | Admitting: Emergency Medicine

## 2011-01-26 DIAGNOSIS — D869 Sarcoidosis, unspecified: Secondary | ICD-10-CM

## 2011-02-21 ENCOUNTER — Encounter: Payer: Self-pay | Admitting: Emergency Medicine

## 2011-02-21 ENCOUNTER — Ambulatory Visit (INDEPENDENT_AMBULATORY_CARE_PROVIDER_SITE_OTHER): Payer: 59 | Admitting: Emergency Medicine

## 2011-02-21 VITALS — BP 122/76 | HR 72 | Temp 98.1°F | Ht 66.0 in | Wt 313.0 lb

## 2011-02-21 DIAGNOSIS — D869 Sarcoidosis, unspecified: Secondary | ICD-10-CM

## 2011-02-21 NOTE — Assessment & Plan Note (Signed)
CT scan of the chest is reassuring, do not believe this is a sarcoid flare.  - reassured her that I do not believe sarcoid is active.  - she will discuss w Dr Leveda Anna possible referral to cardiology if sx persist.

## 2011-02-21 NOTE — Progress Notes (Signed)
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   ROV 09/15/08 -- last seen in 04/2007. Now off prednisone for over a year. Has had some episodic SOB, some worsening of cutaneous lesions on knees, hands. Both breathing and rash are both improving without intervention.   ROV 01/26/10 -- 64 hx sarcoidosis, HTN, DM, obesity. Last seen May 2010. She reports a flare of her sarcoid rash and difficulty breathing this June, treated with pred x 1 month, symptoms improved. Then in August a different kind of dyspnea - ? some volume overload + panic. Eval included TTE w some mild diastolic dysfxn, restarted lasix with improvement. Breathing feels normal unless she gets anxious.  ROV 01/24/11 -- 65 yo woman, sarcoidosis, HTN, obesity.  Returns today, describes some skin sarcoid on legs last 2 months. Treated with a stronger steroid cream w some resolution. Then over last month more exertional SOB, for example when walking across parking lot. She has lost some wt compared w last visit, has done some wt watchers.   ROV 02/21/11 -- 65 yo woman, sarcoidosis, HTN, obesity, DM. CT scan done 01/31/11 - LAD resolved and no parenchymal disease seen. She is still having episodic SOB, anxiety.  She has gained some wt over the last month, about 3 -4 lbs. Just rejoined wt watchers.     Gen: Pleasant, obese, in no distress,  normal affect  ENT: No lesions,  mouth clear,  oropharynx clear, no postnasal drip  Neck: No JVD, no TMG, no carotid bruits  Lungs: No use of accessory muscles, no dullness to percussion, clear without rales or rhonchi  Cardiovascular: RRR, heart sounds normal, no murmur or gallops, no peripheral edema  Musculoskeletal: No deformities, no cyanosis or clubbing  Neuro: alert, non focal  Skin: Warm, R LE with resolving dark bumpy rash   CT CHEST WITHOUT CONTRAST 02/01/11 :  Technique: Multidetector CT imaging of the chest was performed  following the standard protocol without IV contrast.    Comparison: None 06/04/2005  Findings:  There is no enlarged axillary or supraclavicular adenopathy.  Precarinal lymph node measures 0.9 cm, image 16. Improved from 1.9  cm previously. Subcarinal lymph node measures 0.5 cm, image 21.  Improved from 1.8 cm previously. Calcified AP window lymph node  measures 0.6 cm, image 18. Previously 1.2 cm.  No pericardial or pleural effusion identified.  There is no airspace consolidation identified.  No atelectasis or scarring identified. Calcified granuloma is  present in the left upper lobe.  No evidence for interstitial reticulation or bronchiectasis.  Review of the visualized osseous structures is significant for mild  thoracic spondylosis.  IMPRESSION:  1. Improvement and mediastinal and hilar adenopathy.  2. No pulmonary parenchymal manifestations of sarcoid.    SARCOIDOSIS, PULMONARY CT scan of the chest is reassuring, do not believe this is a sarcoid flare.  - reassured her that I do not believe sarcoid is active.  - she will discuss w Dr Leveda Anna possible referral to cardiology if sx persist.

## 2011-02-21 NOTE — Patient Instructions (Signed)
CT scan of the chest is improved compared with 2007. There is no evidence for active sarcoidosis in the lungs at this time.  Follow up with Dr Delton Coombes in 1 year or sooner if you notice any problems.

## 2011-04-26 ENCOUNTER — Telehealth: Payer: Self-pay | Admitting: Family Medicine

## 2011-04-26 NOTE — Telephone Encounter (Signed)
Patient thinks she may be having a sarcoidosis flare up.  States it usually effects her lungs but this time she thinks it is effecting  her legs.  Has red splotches and knots on her legs.  Appointment scheduled for 04/27/11 to see the cross-cover doctor.  Will also keep her scheduled appointment with Dr. Leveda Anna next week. Ileana Ladd

## 2011-04-26 NOTE — Telephone Encounter (Signed)
Rebecca Hicks would like to speak to Dr. Leveda Anna  About a medical condition she has and she isn't sure if he might want her to see another doctor before her appt which is next Friday 1/11.

## 2011-04-27 ENCOUNTER — Ambulatory Visit (INDEPENDENT_AMBULATORY_CARE_PROVIDER_SITE_OTHER): Payer: 59 | Admitting: Family Medicine

## 2011-04-27 VITALS — BP 178/102 | HR 89 | Temp 98.2°F | Ht 66.0 in | Wt 304.5 lb

## 2011-04-27 DIAGNOSIS — L52 Erythema nodosum: Secondary | ICD-10-CM | POA: Insufficient documentation

## 2011-04-27 HISTORY — DX: Erythema nodosum: L52

## 2011-04-27 MED ORDER — DICLOFENAC SODIUM 75 MG PO TBEC: 75.0000 mg | DELAYED_RELEASE_TABLET | Freq: Two times a day (BID) | ORAL | Status: DC

## 2011-04-27 MED ORDER — PREDNISONE 20 MG PO TABS: 20.0000 mg | ORAL_TABLET | Freq: Every day | ORAL | Status: DC

## 2011-04-27 NOTE — Progress Notes (Signed)
  Subjective:    Patient ID: Rebecca Hicks, female    DOB: 1945-07-17, 66 y.o.   MRN: [id number]  HPI 1. Lesions on legs. Patient is a 66 y/o aaf with known Sarcoidosis involving lungs and skin. She has been followed by PCP and Pulmonology for this. No respiratory symptoms or signs at this time. C/o hot tender nodules on both shins. See picture below. 2 week history. Improving slowly. Has been taking Advil for this. She has small petechial like lesions on her knees that she says she gets when her sarcoid flares up. She is not on maintenance medications for sarcoid. No new medications. No contact with TB demographics. She has no cough, or hemoptysis. No fever, chills, night sweats. 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  Review of Systems See HPI 9 point ROS completed and negative or otherwise noted above.    Objective:   Physical Exam Filed Vitals:   04/27/11 0929  BP: 178/102  Pulse: 89  Temp: 98.2 F (36.8 C)  TempSrc: Oral  Height: 5\' 6"  (1.676 m)  Weight: 304 lb 8 oz (138.12 kg)  Lungs:  Normal respiratory effort, chest expands symmetrically. Lungs are clear to auscultation, no crackles or wheezes. Heart - Regular rate and rhythm.  No murmurs, gallops or rubs.    Abdomen: soft and non-tender without masses, organomegaly or hernias noted.  No guarding or rebound Throat: normal mucosa, no exudate, uvula midline, no redness Neck:  No deformities, thyromegaly, masses, or tenderness noted.   Supple with full range of motion without pain. Skin/Ext: rash on lower ext. Knees and shins bilaterally. See image below.  Hot, tender nodule seen above. Red petechial like rash on knee above.     Assessment & Plan:

## 2011-04-27 NOTE — Assessment & Plan Note (Signed)
Patient with EN lesions on bother shins. Associated with Sarcoidosis diagnosis and sarcoid rash she has had previously. Will treat with NSAID/5 day prednisone course. F/u with PCP as scheduled Friday to discuss health maintenance and HTN.

## 2011-04-27 NOTE — Patient Instructions (Signed)
It was great to see you today!  Schedule an appointment to see Dr. Leveda Anna as scheduled.  I have given you a 5 day steroid pack for your sarcoid/erythema nodosum.   Take the volteran twice a day, do not take your aspirin and advil on those days.

## 2011-05-04 ENCOUNTER — Ambulatory Visit (INDEPENDENT_AMBULATORY_CARE_PROVIDER_SITE_OTHER): Payer: 59 | Admitting: Family Medicine

## 2011-05-04 ENCOUNTER — Encounter: Payer: Self-pay | Admitting: Family Medicine

## 2011-05-04 VITALS — BP 158/90 | HR 73 | Temp 96.9°F | Ht 66.0 in | Wt 304.0 lb

## 2011-05-04 DIAGNOSIS — E119 Type 2 diabetes mellitus without complications: Secondary | ICD-10-CM

## 2011-05-04 DIAGNOSIS — R5383 Other fatigue: Secondary | ICD-10-CM

## 2011-05-04 DIAGNOSIS — L52 Erythema nodosum: Secondary | ICD-10-CM

## 2011-05-04 DIAGNOSIS — R5381 Other malaise: Secondary | ICD-10-CM

## 2011-05-04 HISTORY — DX: Other fatigue: R53.83

## 2011-05-04 LAB — CBC
Platelets: 193 10*3/uL (ref 150–400)
RBC: 4.95 MIL/uL (ref 3.87–5.11)
WBC: 6.2 10*3/uL (ref 4.0–10.5)

## 2011-05-04 LAB — POCT UA - MICROALBUMIN
Creatinine, POC: 50 mg/dL
Microalbumin Ur, POC: 80 mg/dL

## 2011-05-04 MED ORDER — FLUOXETINE HCL 40 MG PO CAPS: 40.0000 mg | ORAL_CAPSULE | Freq: Every day | ORAL | Status: DC

## 2011-05-04 MED ORDER — INSULIN NPH ISOPHANE & REGULAR (70-30) 100 UNIT/ML ~~LOC~~ SUSP: SUBCUTANEOUS | Status: DC

## 2011-05-04 NOTE — Assessment & Plan Note (Signed)
Improved

## 2011-05-04 NOTE — Assessment & Plan Note (Signed)
Poor control due to medication and diet non compliance.

## 2011-05-04 NOTE — Patient Instructions (Signed)
I will call with lab results.  If those results are OK, start taking the prozac/fluoxetine

## 2011-05-04 NOTE — Progress Notes (Signed)
  Subjective:    Patient ID: Rebecca Hicks, female    DOB: Nov 14, 1945, 65 y.o.   MRN: [id number]  HPI Rash is much improved and legs much less painful since steroid burst.  Agree with dx of erythema nodosum  DM control slightly better but still poor.  Admits to non compliance with evening meds and with diet.  States she is excessively fatigued.  Feels overwhelmed.  Not particularly depressed per her report.  Sleeps excessively.  No motivation to change lifestyle.    Review of Systems     Objective:   Physical Exam  Lungs clear Cardiac RRR without m or g Ext 2 + edema.  Leg rash much improved.      Assessment & Plan:

## 2011-05-04 NOTE — Assessment & Plan Note (Signed)
Will screen for medical causes.  If lab OK, Rx with prozac

## 2011-05-05 LAB — COMPLETE METABOLIC PANEL WITH GFR
ALT: 17 U/L (ref 0–35)
Albumin: 4 g/dL (ref 3.5–5.2)
CO2: 25 mEq/L (ref 19–32)
Calcium: 9.2 mg/dL (ref 8.4–10.5)
Chloride: 102 mEq/L (ref 96–112)
GFR, Est African American: 69 mL/min
Sodium: 138 mEq/L (ref 135–145)
Total Protein: 7.3 g/dL (ref 6.0–8.3)

## 2011-05-07 ENCOUNTER — Encounter: Payer: Self-pay | Admitting: Family Medicine

## 2011-05-21 DIAGNOSIS — H409 Unspecified glaucoma: Secondary | ICD-10-CM | POA: Insufficient documentation

## 2011-05-25 ENCOUNTER — Telehealth: Payer: Self-pay | Admitting: Family Medicine

## 2011-05-25 MED ORDER — PREDNISONE 20 MG PO TABS: 20.0000 mg | ORAL_TABLET | Freq: Every day | ORAL | Status: AC

## 2011-05-25 NOTE — Telephone Encounter (Signed)
Pt is starting to break out again from her sarcoidosis and wants to know if she should start on another regime of prednisone. [facility] [address]

## 2011-05-25 NOTE — Telephone Encounter (Signed)
Flair of leg rash (Erythema nodosum).  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  Will repeat Rx for 7 additional days (total steroid dose=12 days.)

## 2011-05-25 NOTE — Telephone Encounter (Signed)
Returned call to patient.  Has "sarcoid rash" all over her legs.  Was told to call back if it returned.  Wants to know if she needs another dose of prednisone.  Will route to PCP for advice and call patient back.  Gaylene Brooks, RN

## 2011-06-05 ENCOUNTER — Ambulatory Visit (INDEPENDENT_AMBULATORY_CARE_PROVIDER_SITE_OTHER): Payer: 59 | Admitting: Family Medicine

## 2011-06-05 ENCOUNTER — Ambulatory Visit: Payer: 59

## 2011-06-05 VITALS — BP 179/85 | HR 85 | Temp 98.4°F | Resp 20 | Ht 65.0 in | Wt 302.4 lb

## 2011-06-05 DIAGNOSIS — R059 Cough, unspecified: Secondary | ICD-10-CM

## 2011-06-05 DIAGNOSIS — D869 Sarcoidosis, unspecified: Secondary | ICD-10-CM

## 2011-06-05 DIAGNOSIS — J988 Other specified respiratory disorders: Secondary | ICD-10-CM

## 2011-06-05 DIAGNOSIS — R05 Cough: Secondary | ICD-10-CM

## 2011-06-05 DIAGNOSIS — J069 Acute upper respiratory infection, unspecified: Secondary | ICD-10-CM

## 2011-06-05 DIAGNOSIS — R062 Wheezing: Secondary | ICD-10-CM

## 2011-06-05 MED ORDER — METHYLPREDNISOLONE 4 MG PO KIT: PACK | ORAL | Status: DC

## 2011-06-05 MED ORDER — AZITHROMYCIN 250 MG PO TABS: ORAL_TABLET | ORAL | Status: AC

## 2011-06-05 MED ORDER — IPRATROPIUM-ALBUTEROL 18-103 MCG/ACT IN AERO: 2.0000 | INHALATION_SPRAY | Freq: Four times a day (QID) | RESPIRATORY_TRACT | Status: DC | PRN

## 2011-06-05 NOTE — Progress Notes (Signed)
[facility]:  Office Visit  Chief Complaint:  Chief Complaint  Patient presents with  . Cough     coughing up yellow phlegm x  over 1 week    HPI: Rebecca Hicks is a 66 y.o. female who complains of cough x 1 week, yellow sputum. NO fevers, chills, msk weakness. + wheezing . + Sarcoid. No asthma. Just finished steroids for an episode of skin sarcoid.   Past Medical History  Diagnosis Date  . Sarcoid   . Hypertension   . Diabetes mellitus   . Hyperlipidemia    Past Surgical History  Procedure Date  . Abdominal hysterectomy   . Spine surgery   . Appendectomy   . Eye surgery   . Cataract extraction, bilateral 03/2010, 04/2010   History   Social History  . Marital Status: Single    Spouse Name: N/A    Number of Children: N/A  . Years of Education: N/A   Social History Main Topics  . Smoking status: Former Smoker -- 1.0 packs/day for 20 years    Types: Cigarettes    Quit date: 04/24/1995  . Smokeless tobacco: Never Used  . Alcohol Use: No  . Drug Use: No  . Sexually Active: None    Family History  Problem Relation Age of Onset  . Hypertension    . Diabetes    . Coronary artery disease    . Cancer    . Stroke    . Alcohol abuse     Allergies  Allergen Reactions  . Penicillins    Prior to Admission medications   Medication Sig Start Date End Date Taking? Authorizing Provider  amLODipine (NORVASC) 10 MG tablet Take 10 mg by mouth daily.     Yes Historical Provider, MD  aspirin 81 MG tablet Take 81 mg by mouth daily.     Yes Historical Provider, MD  furosemide (LASIX) 20 MG tablet Take 20 mg by mouth daily as needed.    Yes Historical Provider, MD  glucose blood (ONE TOUCH ULTRA TEST) test strip Test blood sugar two times a day    Yes Historical Provider, MD  insulin NPH-insulin regular (HUMULIN 70/30 PEN) (70-30) 100 UNIT/ML injection 60 units each morning and 60 units each evening.  Eisp qs for 3 month supply. 05/04/11  Yes Sanjuana Letters, MD  Insulin Pen Needle (BD ULTRA-FINE PEN NEEDLES) 29G X 12.7MM MISC Use twice daily.    Yes Historical Provider, MD  metFORMIN (GLUCOPHAGE) 1000 MG tablet Take 1 tablet (1,000 mg total) by mouth 2 (two) times daily. 10/02/10  Yes Sanjuana Letters, MD  metoprolol (TOPROL-XL) 100 MG 24 hr tablet Take 100 mg by mouth daily.     Yes Historical Provider, MD  MICARDIS HCT 80-12.5 MG per tablet TAKE 1 TABLET ONCE A DAY 09/30/10  Yes Sanjuana Letters, MD  psyllium (METAMUCIL SMOOTH TEXTURE) 28 % packet Take 1 packet by mouth 2 (two) times daily as needed.   08/25/10 08/25/11 Yes Sanjuana Letters, MD  rosuvastatin (CRESTOR) 40 MG tablet Take 1 tablet (40 mg total) by mouth daily. 10/04/10  Yes Sanjuana Letters, MD  timolol (TIMOPTIC) 0.25 % ophthalmic solution 2 (two) times daily. Per optho    Yes Historical Provider, MD  diclofenac (VOLTAREN) 75 MG EC tablet Take 1 tablet (75 mg total) by mouth 2 (two) times daily. 04/27/11 04/26/12  Edd Arbour, MD  FLUoxetine (PROZAC) 40 MG capsule Take 1 capsule (40 mg  total) by mouth daily. 05/04/11 05/03/12  Sanjuana Letters, MD  predniSONE (DELTASONE) 20 MG tablet Take 1 tablet (20 mg total) by mouth daily. Take 3 pills (60 mg) on day one then one tab daily thereafter 05/25/11 06/04/11  Sanjuana Letters, MD     ROS: The patient denies fevers, chills, night sweats, unintentional weight loss, chest pain, palpitations, dyspnea on exertion, nausea, vomiting, abdominal pain, dysuria, hematuria, melena, numbness, weakness, or tingling.+ wheezing+cough with sputum production  All other systems have been reviewed and were otherwise negative with the exception of those mentioned in the HPI and as above.    PHYSICAL EXAM: Filed Vitals:   06/05/11 2027  BP: 179/85  Pulse: 85  Temp: 98.4 F (36.9 C)  Resp: 20  Spo2: 97% ( baseline for patient)  Filed Vitals:   06/05/11 2027  Height: 5\' 5"  (1.651 m)  Weight: 302 lb 6.4 oz (137.168 kg)     Body mass index is 50.32 kg/(m^2).  General: Alert, no acute distress, obese AA female HEENT:  Normocephalic, atraumatic, oropharynx patent., bilateral TM normal, + minimal maxillary sinus tenderness Cardiovascular:  Regular rate and rhythm, no rubs murmurs or gallops.  No Carotid bruits, radial pulse intact. No pedal edema.  Respiratory: Clear to auscultation bilaterally.  No rales, or rhonchi.  No cyanosis, no use of accessory musculature; + faint wheeze GI: No organomegaly, abdomen is soft and non-tender, positive bowel sounds.  No masses. Skin: No rashes. Neurologic: Facial musculature symmetric. Psychiatric: Patient is appropriate throughout our interaction. Lymphatic: No cervical lymphadenopathy Musculoskeletal: Gait intact  EKG/XRAY:   Primary read interpreted by Dr. Conley Rolls at [facility]. Unchanged from 01/26/2010. No new infiltrates/effusion   ASSESSMENT/PLAN: Encounter Diagnoses  Name Primary?  . Cough Yes  . Wheezes   . Respiratory infection    1. Patient was Rx the following for possible bronchitis vs URI ie sinusitis vs related to her sarcoidosis-Methylprednisone dose pack, Z-pack, and Combivent. She is to use the medication as directed and the combivent as scheduled until she improves. Deferred CBC since no fevers and patient was recently on prednisone so white count will be elevated by default. Patient is currently at baseline with her SpO2, if she does not feel better in the next 48-72 hours or sxs get worse she is instructed to f/u with [place] or  her PCP ASAP. If have CP or SOB need to go to ER.   2. HTN- Patient did not take her evening BP meds, instructed to moniotr BP and go home and take meds.      Hamilton Capri PHUONG, DO 06/05/2011 9:19 PM

## 2011-06-12 ENCOUNTER — Encounter: Payer: Self-pay | Admitting: Adult Health

## 2011-06-12 ENCOUNTER — Ambulatory Visit (INDEPENDENT_AMBULATORY_CARE_PROVIDER_SITE_OTHER): Payer: 59 | Admitting: Adult Health

## 2011-06-12 VITALS — HR 68 | Temp 97.1°F | Ht 66.0 in | Wt 300.2 lb

## 2011-06-12 DIAGNOSIS — D869 Sarcoidosis, unspecified: Secondary | ICD-10-CM

## 2011-06-12 MED ORDER — HYDROCODONE-HOMATROPINE 5-1.5 MG/5ML PO SYRP: 5.0000 mL | ORAL_SOLUTION | Freq: Four times a day (QID) | ORAL | Status: AC | PRN

## 2011-06-12 MED ORDER — LEVALBUTEROL HCL 0.63 MG/3ML IN NEBU
0.6300 mg | INHALATION_SOLUTION | Freq: Once | RESPIRATORY_TRACT | Status: AC
  Administered 2011-06-12: 0.63 mg via RESPIRATORY_TRACT

## 2011-06-12 NOTE — Assessment & Plan Note (Addendum)
Resolving URI w./ no evidence of active sarcoid flare   Plan:  Mucinex DM Twice daily  As needed  Cough/congestion  Saline nasal rinses As needed   Zyrtec 10mg  At bedtime  As needed  Drainage  Hydromet 1-2 tsp every 4-6 hr As needed  Cough - may make you sleepy.  Please contact office for sooner follow up if symptoms do not improve or worsen or seek emergency care  follow up Dr. Delton Coombes as planned and As needed

## 2011-06-12 NOTE — Patient Instructions (Signed)
Mucinex DM Twice daily  As needed  Cough/congestion  Saline nasal rinses As needed   Zyrtec 10mg  At bedtime  As needed  Drainage  Hydromet 1-2 tsp every 4-6 hr As needed  Cough - may make you sleepy.  Please contact office for sooner follow up if symptoms do not improve or worsen or seek emergency care  follow up Dr. Delton Coombes as planned and As needed

## 2011-06-12 NOTE — Progress Notes (Signed)
66 yo woman with cutaneous and pulmonary sarcoidosis.   ROV 09/15/08 -- last seen in 04/2007. Now off prednisone for over a year. Has had some episodic SOB, some worsening of cutaneous lesions on knees, hands. Both breathing and rash are both improving without intervention.   ROV 01/26/10 -- 64 hx sarcoidosis, HTN, DM, obesity. Last seen May 2010. She reports a flare of her sarcoid rash and difficulty breathing this June, treated with pred x 1 month, symptoms improved. Then in August a different kind of dyspnea - ? some volume overload + panic. Eval included TTE w some mild diastolic dysfxn, restarted lasix with improvement. Breathing feels normal unless she gets anxious.  ROV 01/24/11 -- 66 yo woman, sarcoidosis, HTN, obesity.  Returns today, describes some skin sarcoid on legs last 2 months. Treated with a stronger steroid cream w some resolution. Then over last month more exertional SOB, for example when walking across parking lot. She has lost some wt compared w last visit, has done some wt watchers.   ROV 02/21/11 -- 66 yo woman, sarcoidosis, HTN, obesity, DM. CT scan done 01/31/11 - LAD resolved and no parenchymal disease seen. She is still having episodic SOB, anxiety.  She has gained some wt over the last month, about 3 -4 lbs. Just rejoined wt watchers.    06/12/2011 Acute OV  Presents for recheck from recent bronchitis and ?sarcoid flre. Says that she has had 2 flare of cutaneous flare of sarcoid over last 6 weeks that resolved now with steroid taper x 2 . Developed cough and congestion 1 week ago, given zpack and prednisone taper. Cough is better but not resolved. Has some drainage. Cough is keeping her up at night. No fever or chest pain . CXR showed no acute process. She is feeling better but not completely better.  No hemoptysis , chest pain , edema.    ROS:  Constitutional:   No  weight loss, night sweats,  Fevers, chills,  +fatigue, or  lassitude.  HEENT:   No headaches,  Difficulty  swallowing,  Tooth/dental problems, or  Sore throat,                No sneezing, itching, ear ache, nasal congestion, post nasal drip,   CV:  No chest pain,  Orthopnea, PND, swelling in lower extremities, anasarca, dizziness, palpitations, syncope.   GI  No heartburn, indigestion, abdominal pain, nausea, vomiting, diarrhea, change in bowel habits, loss of appetite, bloody stools.   Resp   No coughing up of blood.    No chest wall deformity  Skin: no rash or lesions.  GU: no dysuria, change in color of urine, no urgency or frequency.  No flank pain, no hematuria   MS:  No joint pain or swelling.  No decreased range of motion.  No back pain.  Psych:  No change in mood or affect. No depression or anxiety.  No memory loss.       Exam:  Gen: Pleasant, obese, in no distress,  normal affect, obese   ENT: No lesions,  mouth clear,  oropharynx clear, no postnasal drip  Neck: No JVD, no TMG, no carotid bruits  Lungs: No use of accessory muscles, no dullness to percussion, coarse BS w/ no wheezing   Cardiovascular: RRR, heart sounds normal, no murmur or gallops, no peripheral edema  Musculoskeletal: No deformities, no cyanosis or clubbing  Neuro: alert, non focal  Skin: Warm,  LE with resolving dark bumpy rash-along shins    CT  CHEST WITHOUT CONTRAST 02/01/11 :  Technique: Multidetector CT imaging of the chest was performed  following the standard protocol without IV contrast.  Comparison: None 06/04/2005  Findings:  There is no enlarged axillary or supraclavicular adenopathy.  Precarinal lymph node measures 0.9 cm, image 16. Improved from 1.9  cm previously. Subcarinal lymph node measures 0.5 cm, image 21.  Improved from 1.8 cm previously. Calcified AP window lymph node  measures 0.6 cm, image 18. Previously 1.2 cm.  No pericardial or pleural effusion identified.  There is no airspace consolidation identified.  No atelectasis or scarring identified. Calcified granuloma is    present in the left upper lobe.  No evidence for interstitial reticulation or bronchiectasis.  Review of the visualized osseous structures is significant for mild  thoracic spondylosis.  IMPRESSION:  1. Improvement and mediastinal and hilar adenopathy.  2. No pulmonary parenchymal manifestations of sarcoid.

## 2011-07-18 ENCOUNTER — Telehealth: Payer: Self-pay | Admitting: Family Medicine

## 2011-07-18 NOTE — Telephone Encounter (Signed)
Patient is calling because she has misplaced the Rx for [employer] that was given to her last May.  She signed up in September and would like a note or a new Rx so that her insurance will pay for it.

## 2011-07-23 NOTE — Telephone Encounter (Signed)
Called, patient found previous letter and may not need further documentation from me.  She will call back if she does.

## 2011-09-20 ENCOUNTER — Ambulatory Visit (INDEPENDENT_AMBULATORY_CARE_PROVIDER_SITE_OTHER): Payer: 59 | Admitting: Family Medicine

## 2011-09-20 VITALS — BP 178/80 | HR 78 | Temp 97.8°F | Resp 18 | Ht 66.0 in | Wt 300.0 lb

## 2011-09-20 DIAGNOSIS — E119 Type 2 diabetes mellitus without complications: Secondary | ICD-10-CM

## 2011-09-20 DIAGNOSIS — R197 Diarrhea, unspecified: Secondary | ICD-10-CM

## 2011-09-20 LAB — TSH: TSH: 2.244 u[IU]/mL (ref 0.350–4.500)

## 2011-09-20 LAB — COMPREHENSIVE METABOLIC PANEL
ALT: 15 U/L (ref 0–35)
CO2: 27 mEq/L (ref 19–32)
Chloride: 102 mEq/L (ref 96–112)
Sodium: 137 mEq/L (ref 135–145)
Total Bilirubin: 0.5 mg/dL (ref 0.3–1.2)
Total Protein: 7.2 g/dL (ref 6.0–8.3)

## 2011-09-20 LAB — POCT CBC
Hemoglobin: 14.2 g/dL (ref 12.2–16.2)
MCHC: 32.1 g/dL (ref 31.8–35.4)
MPV: 10.8 fL (ref 0–99.8)
POC Granulocyte: 2.9 (ref 2–6.9)
POC MID %: 9.2 %M (ref 0–12)
RBC: 4.96 M/uL (ref 4.04–5.48)

## 2011-09-20 MED ORDER — DIPHENOXYLATE-ATROPINE 2.5-0.025 MG PO TABS
1.0000 | ORAL_TABLET | Freq: Four times a day (QID) | ORAL | Status: AC | PRN
Start: 2011-09-20 — End: 2011-09-30

## 2011-09-20 NOTE — Progress Notes (Signed)
Patient Name: Rebecca Hicks Date of Birth: [birth year] Medical Record Number: [id number] Gender: female Date of Encounter: 09/20/2011  History of Present Illness:  Rebecca Hicks is a 66 y.o. very pleasant female patient who presents with the following:  She had had "bouts" of diarrhea for several years.  She had a colonoscopy about 3 years ago due to these symptoms- all looked ok, and she was told to increase her fiber with metamucil.  However, her symptoms have not gone away. She has "no warning" when she needs to go except for stomach cramps, and often is not able to reach a bathroom in time.    She had a "bout" in April, again earlier this month and this week as well.  She had "accidents" when these occurred- she was in work meetings and was very, very embarrassed.  The stool will be like water and she cannot control it.  Her episodes last 2 or 3 days, and she will have to stay in the bathroom for 2 or 3 hours at a time. She has to be careful to always be near a restroom.   IDDM: she uses 60 units of 70/30 in the evening, and between 60 and 75 units in the morning.  She has had some trouble with getting lowglucose recently, but also admits to eating too much bread lately.  She has glaucoma, pulmonary sarcoid, and obesity  Patient Active Problem List  Diagnoses  . SARCOIDOSIS, PULMONARY  . COLONIC POLYPS, ADENOMATOUS  . DIABETES MELLITUS  . DIABETIC PERIPHERAL NEUROPATHY  . HYPERLIPIDEMIA  . OBESITY  . GLAUCOMA NOS  . HYPERTENSION  . DIASTOLIC DYSFUNCTION  . LOW BACK PAIN SYNDROME  . TB SKIN TEST, POSITIVE  . TOBACCO USE, QUIT  . HYSTERECTOMY, HX OF  . Erythema nodosum  . Fatigue   Past Medical History  Diagnosis Date  . Sarcoid   . Hypertension   . Diabetes mellitus   . Hyperlipidemia    Past Surgical History  Procedure Date  . Abdominal hysterectomy   . Spine surgery   . Appendectomy   . Eye surgery   . Cataract extraction, bilateral 03/2010, 04/2010    History  Substance Use Topics  . Smoking status: Former Smoker -- 1.0 packs/day for 20 years    Types: Cigarettes    Quit date: 04/24/1995  . Smokeless tobacco: Never Used  . Alcohol Use: No   Family History  Problem Relation Age of Onset  . Hypertension    . Diabetes    . Coronary artery disease    . Cancer    . Stroke    . Alcohol abuse     Allergies  Allergen Reactions  . Penicillins     Medication list has been reviewed and updated.  Prior to Admission medications   Medication Sig Start Date End Date Taking? Authorizing Provider  aspirin 81 MG tablet Take 81 mg by mouth daily.     Yes Historical Provider, MD  diclofenac (VOLTAREN) 75 MG EC tablet Take 1 tablet (75 mg total) by mouth 2 (two) times daily. 04/27/11 04/26/12 Yes Edd Arbour, MD  FLUoxetine (PROZAC) 40 MG capsule Take 1 capsule (40 mg total) by mouth daily. 05/04/11 05/03/12 Yes Sanjuana Letters, MD  furosemide (LASIX) 20 MG tablet Take 20 mg by mouth daily as needed.    Yes Historical Provider, MD  glucose blood (ONE TOUCH ULTRA TEST) test strip Test blood sugar two times a day    Yes Historical Provider,  MD  insulin NPH-insulin regular (HUMULIN 70/30 PEN) (70-30) 100 UNIT/ML injection 60 units each morning and 60 units each evening.  Eisp qs for 3 month supply. 05/04/11  Yes Sanjuana Letters, MD  Insulin Pen Needle (BD ULTRA-FINE PEN NEEDLES) 29G X 12.7MM MISC Use twice daily.    Yes Historical Provider, MD  metFORMIN (GLUCOPHAGE) 1000 MG tablet Take 1 tablet (1,000 mg total) by mouth 2 (two) times daily. 10/02/10  Yes Sanjuana Letters, MD  metoprolol (TOPROL-XL) 100 MG 24 hr tablet Take 100 mg by mouth daily.     Yes Historical Provider, MD  MICARDIS HCT 80-12.5 MG per tablet TAKE 1 TABLET ONCE A DAY 09/30/10  Yes Sanjuana Letters, MD  rosuvastatin (CRESTOR) 40 MG tablet Take 1 tablet (40 mg total) by mouth daily. 10/04/10  Yes Sanjuana Letters, MD  timolol (TIMOPTIC) 0.25 % ophthalmic  solution 2 (two) times daily. Per optho    Yes Historical Provider, MD  albuterol-ipratropium (COMBIVENT) 18-103 MCG/ACT inhaler Inhale 2 puffs into the lungs every 6 (six) hours as needed for wheezing. 06/05/11 06/04/12  Thao P Le, DO  amLODipine (NORVASC) 10 MG tablet Take 10 mg by mouth daily.      Historical Provider, MD    Review of Systems: As per HPI- otherwise negative. No blood in stool  Physical Examination: Filed Vitals:   09/20/11 1126  BP: 178/80  Pulse: 78  Temp: 97.8 F (36.6 C)  Resp: 18   Filed Vitals:   09/20/11 1126  Height: 5\' 6"  (1.676 m)  Weight: 300 lb (136.079 kg)   Body mass index is 48.42 kg/(m^2).  GEN: WDWN, NAD, Non-toxic, A & O x 3, morbid obesity HEENT: Atraumatic, Normocephalic. Neck supple. No masses, No LAD. Ears and Nose: No external deformity. CV: RRR, No M/G/R. No JVD. No thrill. No extra heart sounds. PULM: CTA B, no wheezes, crackles, rhonchi. No retractions. No resp. distress. No accessory muscle use. ABD: S, NT, ND, +BS. No rebound. No HSM.  EXTR: No c/c/e NEURO Normal gait.  PSYCH: Normally interactive. Conversant. Not depressed or anxious appearing.  Calm demeanor.   Results for orders placed in visit on 09/20/11  POCT CBC      Component Value Range   WBC 4.9  4.6 - 10.2 (K/uL)   Lymph, poc 1.6  0.6 - 3.4    POC LYMPH PERCENT 32.2  10 - 50 (%L)   MID (cbc) 0.5  0 - 0.9    POC MID % 9.2  0 - 12 (%M)   POC Granulocyte 2.9  2 - 6.9    Granulocyte percent 58.6  37 - 80 (%G)   RBC 4.96  4.04 - 5.48 (M/uL)   Hemoglobin 14.2  12.2 - 16.2 (g/dL)   HCT, POC 16.1  09.6 - 47.9 (%)   MCV 89.4  80 - 97 (fL)   MCH, POC 28.6  27 - 31.2 (pg)   MCHC 32.1  31.8 - 35.4 (g/dL)   RDW, POC 04.5     Platelet Count, POC 189  142 - 424 (K/uL)   MPV 10.8  0 - 99.8 (fL)  POCT GLYCOSYLATED HEMOGLOBIN (HGB A1C)      Component Value Range   Hemoglobin A1C 11.3     Assessment and Plan: 1. Diarrhea  POCT CBC, Comprehensive metabolic panel, TSH,  Stool culture, Ova and parasite examination, diphenoxylate-atropine (LOMOTIL) 2.5-0.025 MG per tablet  2. Diabetes mellitus type II  POCT glycosylated hemoglobin (Hb A1C)  Uncontrollable watery diarrhea.  Additional stool studies pending as above.  Also plan to have her see Dr. Juanda Chance for further evaluation.  In the meantime she can use lomotil as needed, and also suggested that she wear disposable underwear for adults to help her feel more confident.   DM: discussed, she is aware that her A1c is way too high.  She will follow- up with her PCP, and in the meantime suggested going up a few units on her insulin BID.   Abbe Amsterdam, MD 09/20/2011 11:56 AM

## 2011-09-21 ENCOUNTER — Encounter: Payer: Self-pay | Admitting: Family Medicine

## 2011-09-24 NOTE — Progress Notes (Signed)
Addended by: Bronson Curb on: 09/24/2011 09:09 AM   Modules accepted: Orders

## 2011-09-26 ENCOUNTER — Ambulatory Visit: Payer: 59 | Admitting: Family Medicine

## 2011-09-26 LAB — OVA AND PARASITE EXAMINATION: OP: NONE SEEN

## 2011-09-27 ENCOUNTER — Encounter: Payer: Self-pay | Admitting: Family Medicine

## 2011-10-08 ENCOUNTER — Encounter: Payer: Self-pay | Admitting: Gastroenterology

## 2011-10-08 NOTE — Progress Notes (Signed)
Called her- she has no appt yet.  Made her an appt for tomorrow- 10/09/11 at 3pm at [place].  Had to [facility] because when I called back there was no answer

## 2011-10-09 ENCOUNTER — Ambulatory Visit (INDEPENDENT_AMBULATORY_CARE_PROVIDER_SITE_OTHER): Payer: 59 | Admitting: Gastroenterology

## 2011-10-09 ENCOUNTER — Encounter: Payer: Self-pay | Admitting: Gastroenterology

## 2011-10-09 VITALS — BP 156/90 | HR 72 | Ht 66.0 in | Wt 302.0 lb

## 2011-10-09 DIAGNOSIS — R197 Diarrhea, unspecified: Secondary | ICD-10-CM

## 2011-10-09 NOTE — Progress Notes (Signed)
HPI: This is a   pleasant 66 year old woman whom I am meeting for the first time today.  Has had bouts of diarrhea interimttently  For years but much worse since April.  Cramping, urgency.  Does not go several times a day.  Watery, non-bloody.  Between the episodes she has regular BMs.  Usually in AM.  The bouts of terrible loose stools occur at work or at night.  2-3 times a week.  No particular foods tend to cause this.  She came off glucophage for a month on her own and that made no difference in her stooling.  She has been cutting back on caffeine lately.  Not losing weight.  She was put on steroids and abx this past winter she thinks.    She has sarcoidosis.    She had a colonoscopy 3 years ago, Dr. Elnoria Howard done for the same issue.  He removed some polyps (they were "benign") told her to take metamucil tid and she tried it but it waned in its effect.  She does not take imodium.  It is not clear to me why she did not followup with Dr. Elnoria Howard.  Routine culture, ova parasites, complete metabolic profile, thyroid testing, CBC were all essentially normal.   Review of systems: Pertinent positive and negative review of systems were noted in the above HPI section. Complete review of systems was performed and was otherwise normal.    Past Medical History  Diagnosis Date  . Sarcoid   . Hypertension   . Diabetes mellitus   . Hyperlipidemia     Past Surgical History  Procedure Date  . Abdominal hysterectomy   . Spine surgery   . Appendectomy   . Eye surgery   . Cataract extraction, bilateral 03/2010, 04/2010    Current Outpatient Prescriptions  Medication Sig Dispense Refill  . aspirin 81 MG tablet Take 81 mg by mouth daily.        . diphenoxylate-atropine (LOMOTIL) 2.5-0.025 MG per tablet Take 1 tablet by mouth 4 (four) times daily as needed.      . furosemide (LASIX) 20 MG tablet Take 20 mg by mouth daily as needed.       Marland Kitchen glucose blood (ONE TOUCH ULTRA TEST) test strip Test  blood sugar two times a day       . insulin NPH-insulin regular (HUMULIN 70/30 PEN) (70-30) 100 UNIT/ML injection 60 units each morning and 60 units each evening.  Eisp qs for 3 month supply.  6 mL  3  . Insulin Pen Needle (BD ULTRA-FINE PEN NEEDLES) 29G X 12.7MM MISC Use twice daily.       . metFORMIN (GLUCOPHAGE) 1000 MG tablet Take 1 tablet (1,000 mg total) by mouth 2 (two) times daily.  180 tablet  3  . metoprolol (TOPROL-XL) 100 MG 24 hr tablet Take 100 mg by mouth daily.        Marland Kitchen MICARDIS HCT 80-12.5 MG per tablet TAKE 1 TABLET ONCE A DAY  90 tablet  2  . rosuvastatin (CRESTOR) 40 MG tablet Take 1 tablet (40 mg total) by mouth daily.  90 tablet  3  . timolol (TIMOPTIC) 0.25 % ophthalmic solution 2 (two) times daily. Per optho         Allergies as of 10/09/2011 - Review Complete 10/09/2011  Allergen Reaction Noted  . Penicillins      Family History  Problem Relation Age of Onset  . Hypertension    . Diabetes    .  Coronary artery disease    . Cancer    . Stroke    . Alcohol abuse      History   Social History  . Marital Status: Single    Spouse Name: N/A    Number of Children: N/A  . Years of Education: N/A   Occupational History  . Not on file.   Social History Main Topics  . Smoking status: Former Smoker -- 1.0 packs/day for 20 years    Types: Cigarettes    Quit date: 04/24/1995  . Smokeless tobacco: Never Used  . Alcohol Use: No  . Drug Use: No  . Sexually Active: Not on file   Other Topics Concern  . Not on file   Social History Narrative  . No narrative on file       Physical Exam: BP 156/90  Pulse 72  Ht 5\' 6"  (1.676 m)  Wt 302 lb (136.986 kg)  BMI 48.74 kg/m2 Constitutional: Morbidly obese Psychiatric: alert and oriented x3 Eyes: extraocular movements intact Mouth: oral pharynx moist, no lesions Neck: supple no lymphadenopathy Cardiovascular: heart regular rate and rhythm Lungs: clear to auscultation bilaterally Abdomen: soft, nontender,  nondistended, no obvious ascites, no peritoneal signs, normal bowel sounds Extremities: no lower extremity edema bilaterally Skin: no lesions on visible extremities    Assessment and plan: 66 y.o. female with  chronic diarrhea   Unclear etiology. She had colonoscopy 2-3 years ago with a different gastroenterologist here in town. We will get those records sent over including any biopsy ports that were associated. For now I instructed her to take a single Imodium every morning shortly after she wakes up and then she can take another later in the day or take a Lomotil pill. We will contact her when I see the results of Dr. Rolla Etienne colonoscopy test.

## 2011-10-09 NOTE — Patient Instructions (Addendum)
We will get in touch with Dr. Haywood Pao office to get copies of your recent colonoscoy +/- any associated biopsies. Start taking one imodium every morning shortly after waking up, then take another imodium or a lomotil pill if needed.

## 2011-10-26 ENCOUNTER — Telehealth: Payer: Self-pay | Admitting: Gastroenterology

## 2011-10-26 DIAGNOSIS — R197 Diarrhea, unspecified: Secondary | ICD-10-CM

## 2011-10-26 NOTE — Telephone Encounter (Signed)
Colonoscopy Dr. Elnoria Howard 04/2009, done for diarrhea; two small adenomas removed, random colon biopsies were all normal.  Was recommended to have recall colonoscoyp at 5 years and try metamucil  Please call her.  I reviewed her records from Dr. Elnoria Howard.  Does not need repeat colonoscopy now, please put her in recall for colonoscopy 04/2014 for polyp surveillance.  She should stay on one immodium every morning and have rov with me in 3-4 weeks to let me know how that helped.

## 2011-10-29 NOTE — Telephone Encounter (Signed)
Left message on machine to call back  

## 2011-10-29 NOTE — Telephone Encounter (Signed)
i called on cell number, no answer.  Left message for her to call back

## 2011-10-29 NOTE — Telephone Encounter (Signed)
2 lomotil in the morning and imodium through the day.  She was told that she should follow up in 3-4 weeks and she is upset that she is not being seen tomorrow.  She had an appointment but it was cancelled because she was moved up to an earlier appt on 10/09/11.  She insist that she speak directly to Dr Christella Hartigan and says if something is not done to solve her problem she wants to see another Dr.  She would like Dr Christella Hartigan to call her on her cell number [phone] (M)

## 2011-10-29 NOTE — Telephone Encounter (Signed)
Pt is coming in for labs tomorrow and has been scheduled for ROV

## 2011-10-29 NOTE — Telephone Encounter (Signed)
We spoke,  She is unhappy about confusion with appt.   She needs celiac panel.  rov in 3-4 weeks, needs to continue daily lomotil for now.

## 2011-10-30 ENCOUNTER — Ambulatory Visit: Payer: 59 | Admitting: Gastroenterology

## 2011-11-05 ENCOUNTER — Other Ambulatory Visit: Payer: 59

## 2011-11-05 DIAGNOSIS — R197 Diarrhea, unspecified: Secondary | ICD-10-CM

## 2011-11-06 LAB — CELIAC PANEL 10
Endomysial Screen: NEGATIVE
Gliadin IgG: 9.2 U/mL (ref <20)
Tissue Transglutaminase Ab, IgA: 5.3 U/mL (ref <20)

## 2011-11-22 ENCOUNTER — Encounter: Payer: Self-pay | Admitting: Family Medicine

## 2011-11-22 ENCOUNTER — Ambulatory Visit (INDEPENDENT_AMBULATORY_CARE_PROVIDER_SITE_OTHER): Payer: 59 | Admitting: Family Medicine

## 2011-11-22 VITALS — BP 172/99 | HR 77 | Temp 97.6°F | Ht 65.0 in | Wt 306.0 lb

## 2011-11-22 DIAGNOSIS — E119 Type 2 diabetes mellitus without complications: Secondary | ICD-10-CM

## 2011-11-22 DIAGNOSIS — D869 Sarcoidosis, unspecified: Secondary | ICD-10-CM

## 2011-11-22 DIAGNOSIS — E785 Hyperlipidemia, unspecified: Secondary | ICD-10-CM

## 2011-11-22 DIAGNOSIS — E1142 Type 2 diabetes mellitus with diabetic polyneuropathy: Secondary | ICD-10-CM

## 2011-11-22 DIAGNOSIS — I10 Essential (primary) hypertension: Secondary | ICD-10-CM

## 2011-11-22 DIAGNOSIS — E1149 Type 2 diabetes mellitus with other diabetic neurological complication: Secondary | ICD-10-CM

## 2011-11-22 LAB — LIPID PANEL
LDL Cholesterol: 109 mg/dL — ABNORMAL HIGH (ref 0–99)
Total CHOL/HDL Ratio: 3.1 Ratio
VLDL: 17 mg/dL (ref 0–40)

## 2011-11-22 LAB — POCT GLYCOSYLATED HEMOGLOBIN (HGB A1C): Hemoglobin A1C: 10.4

## 2011-11-22 MED ORDER — METFORMIN HCL 1000 MG PO TABS: 1000.0000 mg | ORAL_TABLET | Freq: Two times a day (BID) | ORAL | Status: DC

## 2011-11-22 MED ORDER — DIPHENOXYLATE-ATROPINE 2.5-0.025 MG PO TABS: 1.0000 | ORAL_TABLET | Freq: Four times a day (QID) | ORAL | Status: DC | PRN

## 2011-11-22 MED ORDER — METOPROLOL SUCCINATE ER 100 MG PO TB24: 100.0000 mg | ORAL_TABLET | Freq: Every day | ORAL | Status: DC

## 2011-11-23 ENCOUNTER — Encounter: Payer: Self-pay | Admitting: Family Medicine

## 2011-11-23 MED ORDER — INSULIN NPH ISOPHANE & REGULAR (70-30) 100 UNIT/ML ~~LOC~~ SUSP: SUBCUTANEOUS | Status: DC

## 2011-11-23 NOTE — Assessment & Plan Note (Signed)
Poor control.  Increase 70/30 to 70 units qam and 60units qpam

## 2011-11-23 NOTE — Progress Notes (Signed)
  Subjective:    Patient ID: Rebecca Hicks, female    DOB: 1945/08/16, 66 y.o.   MRN: [id number]  HPI  Multiple problems.  In keeping with her pattern, Ms Passarella went through a period in which she did not focus on her health or chronic medical problems.  She has now regained that focus. 1. Abd pain/explosive diarrhea.  This has been a longstanding, recurrent problems.  She and I are convinced that it is not due to the metformin.  Being seen by GI who plans colonoscopy.  Answered her question about why when she had recent colonoscopy.  Explained screening versus diagnostic and encouraged her to proceed. 2. Hypertension.  Poor control.  She had somehow dropped refills of her metoprolol 3. DM poor control.  She has good fasting blood sugars and poor daytime control on current dose of 70/30 60 units bid. 4. Cholesterol.  Taking crestor.  Needs recheck.   5. HPDP behind on several things. 6. Sarcoidosis stable from symptomatic standpoint. 7. Obesity down from max but up from recent low.   Review of Systems Denies CP, syncope or bleeding or SOB     Objective:   Physical Exam Lungs clear Cardiac RRR Abd mild lower abd tenderness.  No masses or rebound Ext no edema Diabetic foot exam normal pulses and monofilament.       Assessment & Plan:

## 2011-11-23 NOTE — Assessment & Plan Note (Signed)
Poor control.  Restart metoprolol

## 2011-11-23 NOTE — Assessment & Plan Note (Signed)
Stable, no change

## 2011-11-23 NOTE — Patient Instructions (Signed)
Increase insulin to 70 units in the morning and 60 units in the evening.  Restart your metoprolol See me in 3 weeks to recheck blood pressure. I will call with lab results.

## 2011-11-23 NOTE — Assessment & Plan Note (Signed)
Check lipids.  Had recent LFTs

## 2011-11-23 NOTE — Assessment & Plan Note (Signed)
Has mild toe tingling but normal diabetic foot exam.

## 2011-11-27 ENCOUNTER — Other Ambulatory Visit (INDEPENDENT_AMBULATORY_CARE_PROVIDER_SITE_OTHER): Payer: 59

## 2011-11-27 ENCOUNTER — Encounter: Payer: Self-pay | Admitting: Gastroenterology

## 2011-11-27 ENCOUNTER — Ambulatory Visit (INDEPENDENT_AMBULATORY_CARE_PROVIDER_SITE_OTHER): Payer: 59 | Admitting: Gastroenterology

## 2011-11-27 VITALS — BP 150/90 | HR 68 | Ht 64.5 in | Wt 306.1 lb

## 2011-11-27 DIAGNOSIS — R1030 Lower abdominal pain, unspecified: Secondary | ICD-10-CM

## 2011-11-27 DIAGNOSIS — R109 Unspecified abdominal pain: Secondary | ICD-10-CM

## 2011-11-27 LAB — BUN: BUN: 27 mg/dL — ABNORMAL HIGH (ref 6–23)

## 2011-11-27 LAB — CREATININE, SERUM: Creatinine, Ser: 1 mg/dL (ref 0.4–1.2)

## 2011-11-27 NOTE — Patient Instructions (Addendum)
You will be set up for a CT scan of abdomen and pelvis with IV and oral contrast for your right lower quadrant abdominal pain.  You have been scheduled for a CT scan of the abdomen and pelvis at [facility] ([address]---this is in the same building as [facility]).   You are scheduled on 11/30/11 at 9 am. You should arrive 15 minutes prior to your appointment time for registration. Please follow the written instructions below on the day of your exam:  WARNING: IF YOU ARE ALLERGIC TO IODINE/X-RAY DYE, PLEASE NOTIFY RADIOLOGY IMMEDIATELY AT [phone]! YOU WILL BE GIVEN A 13 HOUR PREMEDICATION PREP.  1) Do not eat or drink anything after 5 am (4 hours prior to your test) 2) You have been given 2 bottles of oral contrast to drink. The solution may taste better if refrigerated, but do NOT add ice or any other liquid to this solution. Shake  well before drinking.    Drink 1 bottle of contrast @ 7 am  (2 hours prior to your exam)  Drink 1 bottle of contrast @ 8 am  (1 hour prior to your exam)  You may take any medications as prescribed with a small amount of water except for the following: Metformin, Glucophage, Glucovance, Avandamet, Riomet, Fortamet, Actoplus Met, Janumet, Glumetza or Metaglip. The above medications must be held the day of the exam AND 48 hours after the exam.  The purpose of you drinking the oral contrast is to aid in the visualization of your intestinal tract. The contrast solution may cause some diarrhea. Before your exam is started, you will be given a small amount of fluid to drink. Depending on your individual set of symptoms, you may also receive an intravenous injection of x-ray contrast/dye. Plan on being at [facility] for 30 minutes or long, depending on the type of exam you are having performed.  If you have any questions regarding your exam or if you need to reschedule, you may call the CT department at [phone] between the hours of  8:00 am and 5:00 pm, Monday-Friday.   You will need to have labs drawn today at our basement lab. ________________________________________________________________________

## 2011-11-27 NOTE — Progress Notes (Signed)
Review of pertinent gastrointestinal problems: 1. Adenomatous polyps; Colonoscopy Dr. Elnoria Howard 04/2009, done for diarrhea; two small adenomas removed, random colon biopsies were all normal. Was recommended to have recall colonoscoyp at 5 years and try metamucil 2. Chronic diarrhea: see above.  Random biopsies negative. 10/2011 celiac Sprue panel negative. 09/2011 Routine culture, ova parasites, complete metabolic profile, thyroid testing, CBC were all essentially normal.   HPI: This is a  pleasant 66 year old woman whom I last saw 1 or 2 months ago.  The diarrhea is manageble.  She takes lomotil, 2-3 pills a day.  Was on imodium previously around Dr. Elnoria Howard times.  WEight has been stable, no overt bleeding.  She saw Dr. Leveda Anna last week, discussed some soreness in lower abdomen.  Not consistently but intermittent.  Right lower quadrant, feels like needles.  No testing was done for it.  The pain is not related to her bowels at all. Feels like a dull sore pain, pins for a few seconds.     Past Medical History  Diagnosis Date  . Sarcoid   . Hypertension   . Diabetes mellitus   . Hyperlipidemia     Past Surgical History  Procedure Date  . Abdominal hysterectomy   . Spine surgery   . Appendectomy   . Eye surgery   . Cataract extraction, bilateral 03/2010, 04/2010    Current Outpatient Prescriptions  Medication Sig Dispense Refill  . aspirin 81 MG tablet Take 81 mg by mouth daily.        . diphenoxylate-atropine (LOMOTIL) 2.5-0.025 MG per tablet Take 1 tablet by mouth 4 (four) times daily as needed.  120 tablet  6  . furosemide (LASIX) 20 MG tablet Take 20 mg by mouth daily as needed.       Marland Kitchen glucose blood (ONE TOUCH ULTRA TEST) test strip Test blood sugar two times a day       . insulin NPH-insulin regular (HUMULIN 70/30 PEN) (70-30) 100 UNIT/ML injection 70 units each morning and 60 units each evening.  Eisp qs for 3 month supply.  6 mL  3  . Insulin Pen Needle (BD ULTRA-FINE PEN NEEDLES) 29G  X 12.7MM MISC Use twice daily.       . metFORMIN (GLUCOPHAGE) 1000 MG tablet Take 1 tablet (1,000 mg total) by mouth 2 (two) times daily.  180 tablet  3  . metoprolol succinate (TOPROL-XL) 100 MG 24 hr tablet Take 1 tablet (100 mg total) by mouth daily.  90 tablet  3  . MICARDIS HCT 80-12.5 MG per tablet TAKE 1 TABLET ONCE A DAY  90 tablet  2  . rosuvastatin (CRESTOR) 40 MG tablet Take 1 tablet (40 mg total) by mouth daily.  90 tablet  3  . timolol (TIMOPTIC) 0.25 % ophthalmic solution 2 (two) times daily. Per optho         Allergies as of 11/27/2011 - Review Complete 11/27/2011  Allergen Reaction Noted  . Penicillins      Family History  Problem Relation Age of Onset  . Hypertension      siblings  . Diabetes      siblings  . Heart disease Brother   . Cervical cancer Mother   . Stroke Father   . Alcohol abuse Father   . Pancreatic cancer Brother   . Breast cancer Maternal Aunt   . Stroke Paternal Grandmother   . Alcohol abuse Brother     History   Social History  . Marital Status: Single  Spouse Name: N/A    Number of Children: N/A  . Years of Education: N/A   Occupational History  . Manager [employer]   Social History Main Topics  . Smoking status: Former Smoker -- 1.0 packs/day for 20 years    Types: Cigarettes    Quit date: 04/24/1995  . Smokeless tobacco: Never Used  . Alcohol Use: No  . Drug Use: No  . Sexually Active: Not on file   Other Topics Concern  . Not on file   Social History Narrative  . No narrative on file      Physical Exam: BP 150/90  Pulse 68  Ht 5' 4.5" (1.638 m)  Wt 306 lb 2 oz (138.857 kg)  BMI 51.73 kg/m2 Constitutional: generally well-appearing except for morbid obesity Psychiatric: alert and oriented x3 Abdomen: soft, mildly tender in the right lower quadrant, nondistended, no obvious ascites, no peritoneal signs, normal bowel sounds     Assessment and plan: 66 y.o. female with right lower quadrant pains,  improving diarrhea  Her biggest complaint today is that of right lower quadrant pains. She is somewhat tender on exam as well. These have been going on for several weeks and she tells me they are not related to her bowels at all. I'm going to set her up with CT scan abdomen and pelvis for first evaluation.

## 2011-11-30 ENCOUNTER — Other Ambulatory Visit: Payer: 59

## 2011-12-03 ENCOUNTER — Ambulatory Visit (INDEPENDENT_AMBULATORY_CARE_PROVIDER_SITE_OTHER)
Admission: RE | Admit: 2011-12-03 | Discharge: 2011-12-03 | Disposition: A | Discharge status: Home / Self Care | Payer: 59 | Source: Ambulatory Visit | Attending: Gastroenterology | Admitting: Gastroenterology

## 2011-12-03 DIAGNOSIS — R109 Unspecified abdominal pain: Secondary | ICD-10-CM

## 2011-12-03 DIAGNOSIS — R1030 Lower abdominal pain, unspecified: Secondary | ICD-10-CM

## 2011-12-03 MED ORDER — IOHEXOL 300 MG/ML  SOLN
100.0000 mL | Freq: Once | INTRAMUSCULAR | Status: AC | PRN
  Administered 2011-12-03: 100 mL via INTRAVENOUS

## 2011-12-04 ENCOUNTER — Telehealth: Payer: Self-pay | Admitting: Family Medicine

## 2011-12-04 DIAGNOSIS — R109 Unspecified abdominal pain: Secondary | ICD-10-CM

## 2011-12-04 NOTE — Telephone Encounter (Signed)
Patient is calling to leave a message for Dr. Leveda Anna about the next steps need to be after the CT Scan from yesterday.

## 2011-12-05 DIAGNOSIS — R109 Unspecified abdominal pain: Secondary | ICD-10-CM

## 2011-12-05 HISTORY — DX: Unspecified abdominal pain: R10.9

## 2011-12-05 NOTE — Telephone Encounter (Signed)
Discussed.  She will make an appointment with me to see further.  She thinks that the lower abd pain is separate from the diarrhea.  Reviewing labs, she has not had a recent UA (denies urgency, frequency and dysuria)  Order entered for UA.

## 2011-12-05 NOTE — Assessment & Plan Note (Signed)
See documentation.

## 2011-12-19 ENCOUNTER — Ambulatory Visit: Payer: 59 | Admitting: Family Medicine

## 2011-12-21 ENCOUNTER — Other Ambulatory Visit (INDEPENDENT_AMBULATORY_CARE_PROVIDER_SITE_OTHER): Payer: 59

## 2011-12-21 DIAGNOSIS — R109 Unspecified abdominal pain: Secondary | ICD-10-CM

## 2011-12-21 LAB — POCT URINALYSIS DIPSTICK
Blood, UA: NEGATIVE
Nitrite, UA: NEGATIVE
Urobilinogen, UA: 0.2
pH, UA: 5.5

## 2011-12-21 NOTE — Progress Notes (Signed)
Pt came in for routine urine

## 2011-12-23 ENCOUNTER — Other Ambulatory Visit: Payer: Self-pay | Admitting: Family Medicine

## 2011-12-25 ENCOUNTER — Other Ambulatory Visit: Payer: Self-pay | Admitting: Family Medicine

## 2011-12-26 ENCOUNTER — Encounter: Payer: Self-pay | Admitting: Family Medicine

## 2011-12-26 ENCOUNTER — Ambulatory Visit (INDEPENDENT_AMBULATORY_CARE_PROVIDER_SITE_OTHER): Payer: 59 | Admitting: Family Medicine

## 2011-12-26 VITALS — BP 186/98 | HR 67 | Temp 98.8°F | Ht 66.0 in | Wt 312.8 lb

## 2011-12-26 DIAGNOSIS — I10 Essential (primary) hypertension: Secondary | ICD-10-CM

## 2011-12-26 DIAGNOSIS — E119 Type 2 diabetes mellitus without complications: Secondary | ICD-10-CM

## 2011-12-26 MED ORDER — GLUCOSE BLOOD VI STRP: ORAL_STRIP | Status: DC

## 2011-12-26 MED ORDER — INSULIN PEN NEEDLE 29G X 12.7MM MISC: Status: DC

## 2011-12-26 MED ORDER — TELMISARTAN-HCTZ 80-12.5 MG PO TABS: 1.0000 | ORAL_TABLET | Freq: Every day | ORAL | Status: DC

## 2011-12-26 MED ORDER — FUROSEMIDE 20 MG PO TABS: 20.0000 mg | ORAL_TABLET | Freq: Every day | ORAL | Status: DC

## 2011-12-28 NOTE — Assessment & Plan Note (Signed)
Suspect that we really have better control and that she has lost the osmotic diuresis of hyperglycemia.

## 2011-12-28 NOTE — Assessment & Plan Note (Signed)
Poor control.  Restart lasix.   

## 2011-12-28 NOTE — Patient Instructions (Signed)
Get back on your lasix and that should help your blood pressure. See me in 2-3 weeks.  We need to make sure the blood pressure is coming down.

## 2011-12-28 NOTE — Progress Notes (Signed)
  Subjective:    Patient ID: Rebecca Hicks, female    DOB: [birth year], 66 y.o.   MRN: [id number]  HPI Patient is doing well from a symptomatic standpoint.  She has significantly more leg swelling.  She states her home blood sugars are much improved.  Too early for A1C.  Compliance is good.  Several recent serious illnesses in sibs have really got her attention.    Review of Systems     Objective:   Physical ExamLungs clear Cardiac RRR without m or g Ext 3+ edema.        Assessment & Plan:

## 2012-02-13 ENCOUNTER — Ambulatory Visit: Payer: 59 | Admitting: Family Medicine

## 2012-02-27 ENCOUNTER — Ambulatory Visit: Payer: 59 | Admitting: Family Medicine

## 2012-03-03 ENCOUNTER — Encounter: Payer: Self-pay | Admitting: Home Health Services

## 2012-03-04 ENCOUNTER — Telehealth: Payer: Self-pay | Admitting: Emergency Medicine

## 2012-03-04 NOTE — Telephone Encounter (Signed)
Recall Appointment: °Called patient to schedule follow up apt 3 times and left message. Sent letter 03/04/12.  °

## 2012-03-05 ENCOUNTER — Encounter: Payer: Self-pay | Admitting: Home Health Services

## 2012-03-12 ENCOUNTER — Ambulatory Visit (INDEPENDENT_AMBULATORY_CARE_PROVIDER_SITE_OTHER): Payer: 59 | Admitting: Family Medicine

## 2012-03-12 ENCOUNTER — Encounter: Payer: Self-pay | Admitting: Family Medicine

## 2012-03-12 VITALS — BP 154/64 | HR 67 | Temp 99.0°F | Ht 66.0 in | Wt 309.0 lb

## 2012-03-12 DIAGNOSIS — I10 Essential (primary) hypertension: Secondary | ICD-10-CM

## 2012-03-12 DIAGNOSIS — IMO0002 Reserved for concepts with insufficient information to code with codable children: Secondary | ICD-10-CM

## 2012-03-12 DIAGNOSIS — IMO0001 Reserved for inherently not codable concepts without codable children: Secondary | ICD-10-CM

## 2012-03-12 DIAGNOSIS — M545 Low back pain, unspecified: Secondary | ICD-10-CM

## 2012-03-12 DIAGNOSIS — E1165 Type 2 diabetes mellitus with hyperglycemia: Secondary | ICD-10-CM

## 2012-03-12 DIAGNOSIS — E1149 Type 2 diabetes mellitus with other diabetic neurological complication: Secondary | ICD-10-CM

## 2012-03-12 DIAGNOSIS — Z23 Encounter for immunization: Secondary | ICD-10-CM

## 2012-03-12 LAB — POCT GLYCOSYLATED HEMOGLOBIN (HGB A1C): Hemoglobin A1C: 10.3

## 2012-03-12 MED ORDER — TRAMADOL HCL 50 MG PO TABS: 50.0000 mg | ORAL_TABLET | Freq: Three times a day (TID) | ORAL | Status: DC

## 2012-03-12 MED ORDER — PNEUMOCOCCAL VAC POLYVALENT 25 MCG/0.5ML IJ INJ: 0.5000 mL | INJECTION | Freq: Once | INTRAMUSCULAR | Status: DC

## 2012-03-12 NOTE — Progress Notes (Signed)
  Subjective:    Patient ID: Rebecca Hicks, female    DOB: Sep 04, 1945, 66 y.o.   MRN: [id number]  HPIOn the good side, she is more focused on compliance and diet.  She has lost 4 lbs.  BP is much improved.    On the bad side, A1c only dropped from 10.4 to 10.3.  Still over indulges in takeout and salt.  Not exercising primarily due to pain in legs Left > right.  Pain prevents her from sleeping well.    Needs mammogram.   Review of Systems     Objective:   Physical Exam  Lungs clear Cardiac RRR without m or g Ext, trace edema.       Assessment & Plan:

## 2012-03-12 NOTE — Patient Instructions (Addendum)
Stop the diclofenac - I worry it may be worsening your blood pressure. Start the tramadol for pain.  I want you to take it three times per day regularly.   Let me know if that gives you good enough relief to begin exercising again. If not, I will put you on a nerve pain medication.   Get the salt out of your diet. You are due for a mammogram. Call your insurance about the zostavax.   See me before the holidays if your blood pressure is still up.  See me in Feb if the blood pressure is coming down.  I will likely recheck your cholesterol and A1C in Feb. Sign up for my chart.

## 2012-03-13 NOTE — Assessment & Plan Note (Signed)
Probably also has an element of neuropathy, whether diabetic or radicular.  Pain impeding exercise.  Will boost pain meds and consider adding neuropathic pain med

## 2012-03-13 NOTE — Assessment & Plan Note (Signed)
Will not change insulin dose for now.  She lost 4 lbs.  She will start exercising.

## 2012-03-13 NOTE — Assessment & Plan Note (Signed)
Sub optimally controled.  Focus on eliminating salt from diet.

## 2012-05-19 ENCOUNTER — Other Ambulatory Visit: Payer: Self-pay | Admitting: Orthopedic Surgery

## 2012-05-19 DIAGNOSIS — M545 Low back pain, unspecified: Secondary | ICD-10-CM

## 2012-05-20 ENCOUNTER — Encounter: Payer: Self-pay | Admitting: Emergency Medicine

## 2012-05-20 ENCOUNTER — Ambulatory Visit (INDEPENDENT_AMBULATORY_CARE_PROVIDER_SITE_OTHER)
Admission: RE | Admit: 2012-05-20 | Discharge: 2012-05-20 | Disposition: A | Discharge status: Home / Self Care | Payer: 59 | Source: Ambulatory Visit | Attending: Emergency Medicine | Admitting: Emergency Medicine

## 2012-05-20 ENCOUNTER — Ambulatory Visit (INDEPENDENT_AMBULATORY_CARE_PROVIDER_SITE_OTHER): Payer: 59 | Admitting: Emergency Medicine

## 2012-05-20 VITALS — BP 142/82 | HR 70 | Temp 97.8°F | Ht 66.0 in | Wt 306.4 lb

## 2012-05-20 DIAGNOSIS — D869 Sarcoidosis, unspecified: Secondary | ICD-10-CM

## 2012-05-20 NOTE — Patient Instructions (Addendum)
CXR today Get your eye exams as you have been doing Follow with Dr Delton Coombes in 6 months or sooner if you have any problems

## 2012-05-20 NOTE — Assessment & Plan Note (Signed)
Appears to be clinically stable although she has had rash a couple times in the last year. - CXR today - rov 6 months - optho exams

## 2012-05-20 NOTE — Progress Notes (Signed)
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   ROV 09/15/08 -- last seen in 04/2007. Now off prednisone for over a year. Has had some episodic SOB, some worsening of cutaneous lesions on knees, hands. Both breathing and rash are both improving without intervention.   ROV 01/26/10 -- 64 hx sarcoidosis, HTN, DM, obesity. Last seen May 2010. She reports a flare of her sarcoid rash and difficulty breathing this June, treated with pred x 1 month, symptoms improved. Then in August a different kind of dyspnea - ? some volume overload + panic. Eval included TTE w some mild diastolic dysfxn, restarted lasix with improvement. Breathing feels normal unless she gets anxious.  ROV 01/24/11 -- 67 yo woman, sarcoidosis, HTN, obesity.  Returns today, describes some skin sarcoid on legs last 2 months. Treated with a stronger steroid cream w some resolution. Then over last month more exertional SOB, for example when walking across parking lot. She has lost some wt compared w last visit, has done some wt watchers.   ROV 02/21/11 -- 66 yo woman, sarcoidosis, HTN, obesity, DM. CT scan done 01/31/11 - LAD resolved and no parenchymal disease seen. She is still having episodic SOB, anxiety.  She has gained some wt over the last month, about 3 -4 lbs. Just rejoined wt watchers.   Acute OV 06/12/11 --  Presents for recheck from recent bronchitis and ?sarcoid flre. Says that she has had 2 flare of cutaneous flare of sarcoid over last 6 weeks that resolved now with steroid taper x 2 . Developed cough and congestion 1 week ago, given zpack and prednisone taper. Cough is better but not resolved. Has some drainage. Cough is keeping her up at night. No fever or chest pain . CXR showed no acute process. She is feeling better but not completely better.  No hemoptysis , chest pain , edema.   ROV 05/20/12 -- 66 hx sarcoidosis. She has had 2 episodes of rash on her knees since last visit that looked like sarcoid. Her breathing has been  stable, although she is more limited w exertional than she would like to be. Optho exams have been reassuring (she does have glaucoma, cataracts). No cough or wheeze.  No steroids since last time.     Exam:  Filed Vitals:   05/20/12 0932  BP: 142/82  Pulse: 70  Temp: 97.8 F (36.6 C)    Gen: Pleasant, obese, in no distress,  normal affect, obese   ENT: No lesions,  mouth clear,  oropharynx clear, no postnasal drip  Neck: No JVD, no TMG, no carotid bruits  Lungs: No use of accessory muscles, no dullness to percussion, coarse BS w/ no wheezing   Cardiovascular: RRR, heart sounds normal, no murmur or gallops, no peripheral edema  Musculoskeletal: No deformities, no cyanosis or clubbing  Neuro: alert, non focal  Skin: Warm,  LE with resolving dark bumpy rash-along shins    CT CHEST WITHOUT CONTRAST 02/01/11 :  Technique: Multidetector CT imaging of the chest was performed  following the standard protocol without IV contrast.  Comparison: None 06/04/2005  Findings:  There is no enlarged axillary or supraclavicular adenopathy.  Precarinal lymph node measures 0.9 cm, image 16. Improved from 1.9  cm previously. Subcarinal lymph node measures 0.5 cm, image 21.  Improved from 1.8 cm previously. Calcified AP window lymph node  measures 0.6 cm, image 18. Previously 1.2 cm.  No pericardial or pleural effusion identified.  There is no airspace consolidation identified.  No atelectasis  or scarring identified. Calcified granuloma is  present in the left upper lobe.  No evidence for interstitial reticulation or bronchiectasis.  Review of the visualized osseous structures is significant for mild  thoracic spondylosis.  IMPRESSION:  1. Improvement and mediastinal and hilar adenopathy.  2. No pulmonary parenchymal manifestations of sarcoid.   SARCOIDOSIS, PULMONARY Appears to be clinically stable although she has had rash a couple times in the last year. - CXR today - rov 6  months - optho exams

## 2012-05-22 NOTE — Progress Notes (Signed)
Quick Note:  Spoke with patient, patient aware of results as listed below per RB. Verbalized understanding, nothing further needed at this time. ______

## 2012-05-24 ENCOUNTER — Other Ambulatory Visit: Payer: 59

## 2012-05-26 ENCOUNTER — Ambulatory Visit
Admission: RE | Admit: 2012-05-26 | Discharge: 2012-05-26 | Disposition: A | Discharge status: Home / Self Care | Payer: 59 | Source: Ambulatory Visit | Attending: Orthopedic Surgery | Admitting: Orthopedic Surgery

## 2012-05-26 DIAGNOSIS — M545 Low back pain, unspecified: Secondary | ICD-10-CM

## 2012-07-09 ENCOUNTER — Encounter: Payer: Self-pay | Admitting: *Deleted

## 2012-08-11 ENCOUNTER — Encounter: Payer: Self-pay | Admitting: *Deleted

## 2012-09-12 ENCOUNTER — Telehealth: Payer: Self-pay | Admitting: Family Medicine

## 2012-09-12 NOTE — Telephone Encounter (Signed)
Please call Ms. Dishon regarding being seen by a provider other than you for her infected toe.  Was seeing a podiatrist, but feel now she need a regular fam med phys to check.  Want you to give advice as to who she can see while you are not available.

## 2012-09-16 ENCOUNTER — Telehealth: Payer: Self-pay | Admitting: Family Medicine

## 2012-09-16 NOTE — Telephone Encounter (Signed)
Will fwd to Dr. Leveda Anna for advice.  Jenisse Vullo, Darlyne Russian, CMA

## 2012-09-16 NOTE — Telephone Encounter (Signed)
Called and discussed.  She has an appointment to see me on June 11.  She will call and get an appointment to see someone as an SDA  For infected toenail - removed 1 month ago and still draining despite one round of antibiotics.

## 2012-09-16 NOTE — Telephone Encounter (Signed)
In hospital this week.  Denny Levy would be great.  OK to see any resident and precept.

## 2012-09-16 NOTE — Telephone Encounter (Signed)
Patient is calling about the toenail that she had removed.  She says that it is infected and she wants to know if he wants her to see another doctor.  She saw Dr. Fernanda Drum at [facility].  She just wants some direction from Dr. Leveda Anna.

## 2012-09-18 ENCOUNTER — Ambulatory Visit (INDEPENDENT_AMBULATORY_CARE_PROVIDER_SITE_OTHER): Payer: 59 | Admitting: Family Medicine

## 2012-09-18 ENCOUNTER — Encounter: Payer: Self-pay | Admitting: Family Medicine

## 2012-09-18 VITALS — BP 130/88 | Ht 66.0 in | Wt 305.0 lb

## 2012-09-18 DIAGNOSIS — M79675 Pain in left toe(s): Secondary | ICD-10-CM

## 2012-09-18 DIAGNOSIS — M79676 Pain in unspecified toe(s): Secondary | ICD-10-CM | POA: Insufficient documentation

## 2012-09-18 DIAGNOSIS — M79609 Pain in unspecified limb: Secondary | ICD-10-CM

## 2012-09-18 HISTORY — DX: Pain in unspecified toe(s): M79.676

## 2012-09-18 MED ORDER — CIPROFLOXACIN HCL 500 MG PO TABS: 500.0000 mg | ORAL_TABLET | Freq: Two times a day (BID) | ORAL | Status: DC

## 2012-09-18 MED ORDER — DOXYCYCLINE HYCLATE 100 MG PO TABS: 100.0000 mg | ORAL_TABLET | Freq: Two times a day (BID) | ORAL | Status: DC

## 2012-09-18 NOTE — Progress Notes (Signed)
Patient ID: Rebecca Hicks, female   DOB: Dec 06, 1945, 67 y.o.   MRN: [id number]  [facility] Luwanda Starr M. Jannatul Wojdyla, MD Phone: [phone]   Subjective: HPI: Patient is a 67 y.o. female presenting to clinic today for same day appointment.  Patient had great left toenail removed over one month ago by podiatry. She states it is not healing well. No redness of the toe, able to walk ok but she has a new throbbing pain in her toe. (She has cut out the tip of her shoe to accommodate her toe.) She denies pus draining but has seen serosanguinous drainage on PolyMem bandage. She was given 5 days of Azithromycin 3 weeks ago with no changes. She states her toenail was severely ingrown on both edges. She states her toenail was thickened and gray prior to removal so he used acid to prevent it from growing back. 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 Last A1C was >10. She has chronic edema in her legs as well.   History Reviewed: Former smoker.  ROS: Please see HPI above.  Objective: Office vital signs reviewed. BP 130/88  Ht 5\' 6"  (1.676 m)  Wt 305 lb (138.347 kg)  BMI 49.25 kg/m2  Physical Examination:  General: Awake, alert. NAD Pulm: CTAB, no wheezes Cardio: RRR, no murmurs appreciated Extremities: 2+ edema of left leg. Nail bed with bright red granulation tissue. More yellow and firm lateral edges but unable to scrape these. 3/4cm firm, hyperpigmented callous-like area on top of great toe distal to nail bed. TTP around nail bed. FROM of toes and no pain with passive movement of toe. Good distal pulses Neuro: Decreased sensation of toes. Normal gail  Assessment: 67 y.o. female with toe pain  Plan: See Problem List and After Visit Summary

## 2012-09-18 NOTE — Patient Instructions (Addendum)
It was nice to meet you today. I am sorry your toenail is still bothering you.  We are going to treat you with 10 days of antibiotics just incase there is an infection causing your pain. Please make an appointment to back in 10-14 days for recheck.  Keep using the bandages and soaking your foot.  Rebecca Hicks M. [place] Sloop, M.D.

## 2012-09-18 NOTE — Assessment & Plan Note (Signed)
Pain of great toe s/p nail removal. Patient with slow healing nail bed. No obvious signs of infection or cellulitis, but given her pain and slow healing due to DM, will treat with Cipro/Doxy similar to a diabetic foot ulcer. Abx x10 days, then follow up on June 11 as previously scheduled. If pain worsens, she notices pus or anything changes she will let us know. Patient may need referral to wound clinic for delayed healing. Discussed with Dr. Gwendolyn Grant who also examined patient.

## 2012-09-18 NOTE — Telephone Encounter (Signed)
Seeing Dr. Mikel Cella 5/29

## 2012-10-01 ENCOUNTER — Ambulatory Visit: Payer: 59 | Admitting: Family Medicine

## 2012-10-10 ENCOUNTER — Telehealth: Payer: Self-pay | Admitting: Emergency Medicine

## 2012-10-10 NOTE — Telephone Encounter (Signed)
°  Called patient and left messages x3. Sent letter 10/10/12 °

## 2012-10-15 ENCOUNTER — Ambulatory Visit (INDEPENDENT_AMBULATORY_CARE_PROVIDER_SITE_OTHER): Payer: 59 | Admitting: Family Medicine

## 2012-10-15 ENCOUNTER — Encounter: Payer: Self-pay | Admitting: Family Medicine

## 2012-10-15 VITALS — BP 158/74 | HR 69 | Temp 99.0°F | Ht 66.0 in | Wt 303.0 lb

## 2012-10-15 DIAGNOSIS — E1165 Type 2 diabetes mellitus with hyperglycemia: Secondary | ICD-10-CM

## 2012-10-15 DIAGNOSIS — M545 Low back pain, unspecified: Secondary | ICD-10-CM

## 2012-10-15 DIAGNOSIS — I10 Essential (primary) hypertension: Secondary | ICD-10-CM

## 2012-10-15 DIAGNOSIS — E669 Obesity, unspecified: Secondary | ICD-10-CM

## 2012-10-15 DIAGNOSIS — IMO0001 Reserved for inherently not codable concepts without codable children: Secondary | ICD-10-CM

## 2012-10-15 MED ORDER — INSULIN ASPART PROT & ASPART (70-30 MIX) 100 UNIT/ML ~~LOC~~ SUSP: 65.0000 [IU] | Freq: Two times a day (BID) | SUBCUTANEOUS | Status: DC

## 2012-10-15 MED ORDER — METOPROLOL SUCCINATE ER 200 MG PO TB24: 200.0000 mg | ORAL_TABLET | Freq: Every day | ORAL | Status: DC

## 2012-10-15 NOTE — Patient Instructions (Addendum)
Keep taking your insulin 70 units in the morning and 60 units at night. See our dietician, Dr. Gerilyn Pilgrim. You can take two of your metoprolol pills (200 mg) until the new prescription comes in. Take your furosemide every day. I will call with lab results.

## 2012-10-16 ENCOUNTER — Encounter: Payer: Self-pay | Admitting: Family Medicine

## 2012-10-16 LAB — BASIC METABOLIC PANEL
BUN: 28 mg/dL — ABNORMAL HIGH (ref 6–23)
CO2: 25 mEq/L (ref 19–32)
Glucose, Bld: 101 mg/dL — ABNORMAL HIGH (ref 70–99)
Potassium: 4.3 mEq/L (ref 3.5–5.3)

## 2012-10-16 NOTE — Assessment & Plan Note (Signed)
Poor control.  Increase metoprolol 

## 2012-10-16 NOTE — Assessment & Plan Note (Signed)
Her focus right now is on diabetic control rather than weight loss - although she recognizes that weight loss is key.

## 2012-10-16 NOTE — Assessment & Plan Note (Signed)
Back pain quite severe and limits activity.  Yet another reason for weight loss.

## 2012-10-16 NOTE — Progress Notes (Signed)
  Subjective:    Patient ID: Rebecca Hicks, female    DOB: Jan 05, 1946, 67 y.o.   MRN: [id number]  HPI Patient here for recheck of DM and HBP. She states she is doing everything she knows in regards to diet.  Would like to see nutritionist to improve her knowledge. Exercise is limited due to still working full time, joint pains and weight. She states she is much more compliant with meds BP and AIC are both up. Needs mammogram and zostavax - willing to schedule mammogram.     Review of Systems     Objective:   Physical Exam Lungs clear Cardiac RRR without m or g Abd benign. Ext 2+ edema.       Assessment & Plan:

## 2012-10-16 NOTE — Assessment & Plan Note (Signed)
Still poor control.  Major intervention is nutrition referral.

## 2012-10-20 ENCOUNTER — Telehealth: Payer: Self-pay | Admitting: Family Medicine

## 2012-10-20 NOTE — Telephone Encounter (Signed)
By my records and recollection, the patient is only on human insulin 70/30

## 2012-10-20 NOTE — Telephone Encounter (Signed)
Spoke with patient and the [place].  There seems to be comflicting information.  Pt is going to call the pharmacy and clear up and get back with [place].  Kahlin Mark, Darlyne Russian, CMA

## 2012-10-20 NOTE — Telephone Encounter (Signed)
Pharmacy called pt and told her the new prescription given to her at her last visit is not the one her insurance covers. She is unsure of which one dr wants her to take Please advise

## 2012-10-20 NOTE — Telephone Encounter (Signed)
Fax received for HUMALOG KWIK - not on MAR- please advise. Wyatt Haste, RN-BSN

## 2012-11-07 ENCOUNTER — Telehealth: Payer: Self-pay | Admitting: Family Medicine

## 2012-11-07 NOTE — Telephone Encounter (Signed)
Pt called and message left that refill was signed and receipt confirmed on June 25.  [facility] called states that have record of other meds on that day but not that med - verbal order given from prescription on 6/25.   Wyatt Haste, RN-BSN

## 2012-11-07 NOTE — Telephone Encounter (Signed)
Patient is calling because the [facility] never received the Rx for Metoprolol 200mg  and she needs it resent for the 90 day supply with the 3 refills.

## 2012-12-03 ENCOUNTER — Ambulatory Visit (INDEPENDENT_AMBULATORY_CARE_PROVIDER_SITE_OTHER): Payer: 59 | Admitting: Family Medicine

## 2012-12-03 VITALS — BP 180/98 | HR 62 | Temp 98.3°F | Ht 66.0 in | Wt 303.0 lb

## 2012-12-03 DIAGNOSIS — R1012 Left upper quadrant pain: Secondary | ICD-10-CM | POA: Insufficient documentation

## 2012-12-03 DIAGNOSIS — R52 Pain, unspecified: Secondary | ICD-10-CM

## 2012-12-03 HISTORY — DX: Left upper quadrant pain: R10.12

## 2012-12-03 MED ORDER — GABAPENTIN 100 MG PO CAPS: 100.0000 mg | ORAL_CAPSULE | Freq: Three times a day (TID) | ORAL | Status: DC

## 2012-12-03 NOTE — Patient Instructions (Addendum)
It was nice to meet you. I am sorry you are not feeling well.  I am not sure exactly what is going on but I do not see any "bad" things on my exam. It is possible this is early shingles so if you see any redness or rash, please call or come to the clinic.  I have given you some pain medicine that helps with nerve pain. You will take one three times per day. It may make you sleepy.  Please make a follow up appointment with Dr. Leveda Anna within the next week to check on your pain and your blood pressure.  Rebecca Hicks, M.D.

## 2012-12-03 NOTE — Assessment & Plan Note (Signed)
Pain appears to be nerve pain, but no signs of shingles. She reports she just started taking Gabapentin and is taking 100mg  once daily. Encouraged her to keep taking it, and increase slowly to BID. Watch for signs of redness or rash, and if it appears she should return to clinic. I do not see any "red flag symptoms" at this time, but she should follow up soon with Dr. Leveda Anna to recheck her blood pressure and discuss pain. Patient agrees.

## 2012-12-03 NOTE — Progress Notes (Signed)
Patient ID: Rebecca Hicks, female   DOB: Nov 17, 1945, 67 y.o.   MRN: [id number]  [facility] Amber M. Hairford, MD Phone: [phone]   Subjective: HPI: Patient is a 67 y.o. female presenting to clinic today for same day appointment for side pain.  Left side/flank/abdomen pain x 1 week. Getting worse. She states she feels knot under the skin that are sore but she has a sharp burning pain every once in a while. No dysuria, but some urinary frequency. Never had anything like this before. Pain is currently 7/10. She has tried OTC medication mostly, but she tried Vicodin last night which helped very little. She has problems in her lower back and hip but this feels different. No fevers or prodromal symptoms.   History Reviewed: Non smoker.  ROS: Please see HPI above.  Objective: Office vital signs reviewed. BP 180/98  Pulse 62  Temp(Src) 98.3 F (36.8 C) (Oral)  Ht 5\' 6"  (1.676 m)  Wt 303 lb (137.44 kg)  BMI 48.93 kg/m2  Physical Examination:  General: Awake, alert. NAD. Appears as if she does not feel well. HEENT: Atraumatic, normocephalic. MMM Pulm: CTAB, no wheezes Cardio: RRR, no murmurs appreciated Abdomen:+BS, soft. Left midaxiallary line with subcutaneous nodule with overlying tortuous vein. No rashes or erythema noted. Extremities: No edema Neuro: Grossly intact  Assessment: 67 y.o. female with side pain  Plan: See Problem List and After Visit Summary

## 2012-12-24 ENCOUNTER — Ambulatory Visit (INDEPENDENT_AMBULATORY_CARE_PROVIDER_SITE_OTHER): Payer: 59 | Admitting: Family Medicine

## 2012-12-24 VITALS — BP 142/61 | HR 74 | Temp 98.9°F | Ht 66.0 in | Wt 302.0 lb

## 2012-12-24 DIAGNOSIS — N179 Acute kidney failure, unspecified: Secondary | ICD-10-CM

## 2012-12-24 DIAGNOSIS — M545 Low back pain, unspecified: Secondary | ICD-10-CM

## 2012-12-24 DIAGNOSIS — R1012 Left upper quadrant pain: Secondary | ICD-10-CM

## 2012-12-24 DIAGNOSIS — E1165 Type 2 diabetes mellitus with hyperglycemia: Secondary | ICD-10-CM

## 2012-12-24 DIAGNOSIS — N189 Chronic kidney disease, unspecified: Secondary | ICD-10-CM

## 2012-12-24 DIAGNOSIS — IMO0001 Reserved for inherently not codable concepts without codable children: Secondary | ICD-10-CM

## 2012-12-24 DIAGNOSIS — I10 Essential (primary) hypertension: Secondary | ICD-10-CM

## 2012-12-24 LAB — POCT URINALYSIS DIPSTICK
Bilirubin, UA: NEGATIVE
Leukocytes, UA: NEGATIVE
Nitrite, UA: NEGATIVE

## 2012-12-24 LAB — POCT UA - MICROSCOPIC ONLY

## 2012-12-24 LAB — LIPASE: Lipase: 18 U/L (ref 0–75)

## 2012-12-24 LAB — COMPLETE METABOLIC PANEL WITH GFR
AST: 21 U/L (ref 0–37)
Alkaline Phosphatase: 72 U/L (ref 39–117)
BUN: 38 mg/dL — ABNORMAL HIGH (ref 6–23)
Creat: 1.45 mg/dL — ABNORMAL HIGH (ref 0.50–1.10)
GFR, Est Non African American: 38 mL/min — ABNORMAL LOW
Glucose, Bld: 210 mg/dL — ABNORMAL HIGH (ref 70–99)
Total Bilirubin: 0.5 mg/dL (ref 0.3–1.2)

## 2012-12-24 NOTE — Patient Instructions (Addendum)
I am sorry, I do not have a for sure diagnosis right now. Stop the diclofenac - it may be causing the pain.   I will call tomorrow with lab results and to discuss next steps.  We may end up waiting a few days to see if stopping the diclofenac makes the pain go away.

## 2012-12-25 ENCOUNTER — Encounter: Payer: Self-pay | Admitting: Family Medicine

## 2012-12-25 DIAGNOSIS — N189 Chronic kidney disease, unspecified: Secondary | ICD-10-CM

## 2012-12-25 DIAGNOSIS — N179 Acute kidney failure, unspecified: Secondary | ICD-10-CM

## 2012-12-25 HISTORY — DX: Chronic kidney disease, unspecified: N18.9

## 2012-12-25 HISTORY — DX: Acute kidney failure, unspecified: N17.9

## 2012-12-25 LAB — CBC WITH DIFFERENTIAL/PLATELET
Basophils Absolute: 0 10*3/uL (ref 0.0–0.1)
Basophils Relative: 0 % (ref 0–1)
Eosinophils Absolute: 0.2 10*3/uL (ref 0.0–0.7)
Eosinophils Relative: 3 % (ref 0–5)
HCT: 41.3 % (ref 36.0–46.0)
MCH: 27.8 pg (ref 26.0–34.0)
MCHC: 32.4 g/dL (ref 30.0–36.0)
MCV: 85.7 fL (ref 78.0–100.0)
Monocytes Absolute: 0.5 10*3/uL (ref 0.1–1.0)
Neutro Abs: 4.7 10*3/uL (ref 1.7–7.7)
RDW: 15.8 % — ABNORMAL HIGH (ref 11.5–15.5)

## 2012-12-25 NOTE — Assessment & Plan Note (Signed)
DC lasix due to high creat

## 2012-12-25 NOTE — Assessment & Plan Note (Signed)
Hold metformin due to elevated creat.

## 2012-12-25 NOTE — Assessment & Plan Note (Addendum)
Creat elevated.  Will hold lasix (over diuresed?) Stop diclofenac.  Hold metformin.  Renal ultrasound.  Recheck creat in one week.

## 2012-12-25 NOTE — Assessment & Plan Note (Signed)
Stop diclofenac.  Emphasized again that she should not use with her kidney function.

## 2012-12-25 NOTE — Assessment & Plan Note (Signed)
Pain does not fit any specific pattern.  By timing, diclofenac induced gastritis would be reasonable.  By hypersensitivity, neuropathic pain reasonable but no back discomfort around lower thoracic area which corresponds to the dermatome.  No rash so shingles unlikely.  No change in bowels so diverticulitis/colitis unlikely.  No urinary symptoms so nephrolithiasis unlikely.  Start with labs and go for imaging based on those results.

## 2012-12-25 NOTE — Progress Notes (Signed)
  Subjective:    Patient ID: Rebecca Hicks, female    DOB: 1945-06-11, 67 y.o.   MRN: [id number]  HPI Patient with LUQ abd pain for ~weeks.  Seen earlier by Dr. Mikel Cella.  Pain is described as sharp with skin hypersensitivity.  No change in appetite, bowels, urine.  Pain is present constantly with exacerbations mainly with touch.  Has had some low back pain.  No mid back pain.  No cough or pulm symptoms.  Only correlation is that pain began approximately the same time she started taking the diclofenac again (stopped by me due to renal/diabetic concerns, restarted by ortho.)    Review of Systems     Objective:   Physical Exam Lungs clear Abd benign.  No organomegally or CVA tenderness.  Does have skin hypersensitivity in Lt upper quadrent.  No masses.       Assessment & Plan:

## 2013-01-01 ENCOUNTER — Other Ambulatory Visit: Payer: 59

## 2013-01-01 DIAGNOSIS — N179 Acute kidney failure, unspecified: Secondary | ICD-10-CM

## 2013-01-01 DIAGNOSIS — N189 Chronic kidney disease, unspecified: Secondary | ICD-10-CM

## 2013-01-01 LAB — BASIC METABOLIC PANEL
CO2: 21 mEq/L (ref 19–32)
Calcium: 9.6 mg/dL (ref 8.4–10.5)
Chloride: 99 mEq/L (ref 96–112)
Glucose, Bld: 461 mg/dL — ABNORMAL HIGH (ref 70–99)
Sodium: 132 mEq/L — ABNORMAL LOW (ref 135–145)

## 2013-01-01 NOTE — Progress Notes (Signed)
BMP DONE TODAY Tyran Huser 

## 2013-01-02 ENCOUNTER — Ambulatory Visit (HOSPITAL_COMMUNITY)
Admission: RE | Admit: 2013-01-02 | Discharge: 2013-01-02 | Disposition: A | Discharge status: Home / Self Care | Payer: 59 | Source: Ambulatory Visit | Attending: Family Medicine | Admitting: Family Medicine

## 2013-01-02 DIAGNOSIS — N179 Acute kidney failure, unspecified: Secondary | ICD-10-CM

## 2013-01-02 DIAGNOSIS — N189 Chronic kidney disease, unspecified: Secondary | ICD-10-CM | POA: Insufficient documentation

## 2013-01-21 ENCOUNTER — Other Ambulatory Visit: Payer: Self-pay | Admitting: Family Medicine

## 2013-02-26 ENCOUNTER — Other Ambulatory Visit: Payer: Self-pay

## 2013-03-04 ENCOUNTER — Ambulatory Visit (INDEPENDENT_AMBULATORY_CARE_PROVIDER_SITE_OTHER): Payer: 59 | Admitting: Family Medicine

## 2013-03-04 ENCOUNTER — Encounter: Payer: Self-pay | Admitting: Family Medicine

## 2013-03-04 VITALS — HR 68 | Temp 98.1°F | Ht 66.0 in | Wt 303.6 lb

## 2013-03-04 DIAGNOSIS — N182 Chronic kidney disease, stage 2 (mild): Secondary | ICD-10-CM

## 2013-03-04 DIAGNOSIS — M545 Low back pain, unspecified: Secondary | ICD-10-CM

## 2013-03-04 DIAGNOSIS — I10 Essential (primary) hypertension: Secondary | ICD-10-CM

## 2013-03-04 DIAGNOSIS — Z23 Encounter for immunization: Secondary | ICD-10-CM

## 2013-03-04 DIAGNOSIS — E1165 Type 2 diabetes mellitus with hyperglycemia: Secondary | ICD-10-CM

## 2013-03-04 DIAGNOSIS — IMO0001 Reserved for inherently not codable concepts without codable children: Secondary | ICD-10-CM

## 2013-03-04 DIAGNOSIS — E1149 Type 2 diabetes mellitus with other diabetic neurological complication: Secondary | ICD-10-CM

## 2013-03-04 MED ORDER — GABAPENTIN 300 MG PO CAPS: 300.0000 mg | ORAL_CAPSULE | Freq: Three times a day (TID) | ORAL | Status: DC

## 2013-03-04 MED ORDER — FUROSEMIDE 20 MG PO TABS: 20.0000 mg | ORAL_TABLET | Freq: Every day | ORAL | Status: DC

## 2013-03-04 NOTE — Patient Instructions (Addendum)
Your diabetes control is better, but not yet good.  I would like you to eat a better breakfast and an earlier lunch Lets try taking your morning insulin at 1OAM with a snack if you have not had breakfast.  Take you evening insulin 1/2 hour before dinner.(For you that would generally be 10AM and 8PM) Let's add the furosemide back to help both with the fluid and the blood pressure. Get your mammogram done  You got a flu shot today.   Ask your benefits person about the shingles vaccine=zostavax. See me in one month about your blood pressure. Let's increase your gabapentin to 300 mg three times a day for nerve pain.

## 2013-03-05 DIAGNOSIS — N189 Chronic kidney disease, unspecified: Secondary | ICD-10-CM | POA: Insufficient documentation

## 2013-03-05 DIAGNOSIS — N183 Chronic kidney disease, stage 3 unspecified: Secondary | ICD-10-CM | POA: Insufficient documentation

## 2013-03-05 DIAGNOSIS — N182 Chronic kidney disease, stage 2 (mild): Secondary | ICD-10-CM

## 2013-03-05 HISTORY — DX: Chronic kidney disease, stage 2 (mild): N18.2

## 2013-03-05 NOTE — Progress Notes (Signed)
  Subjective:    Patient ID: Rebecca Hicks, female    DOB: July 27, 1945, 67 y.o.   MRN: [id number]  HPI Rebecca Hicks is not thriving.  Issues are: 1. Her biggest priority is her leg Lt>Rt discomfort - thought to have a significant element of radiculopathy.  She is not as active with the leg pain.  She also admits to focusing less on her other problems because this consumes her. 2. HBP.  Has been off furosemide due to concerns about renal function.  She states legs are significantly swollen.  States she is vigorous with her low salt diet.  BPs at home mildly elevated in the 140-150 range. 3. DM control is slightly better but A1C is still >9.  She is taking 70/30 60 units BID but sometimes only takes forty units in the morning because she skips breakfast and eat a late lunch.  Meal schedule is : Breakfast maybe 8AM Lunch often not until 2:30. Dinner 8:30 to 9:00 PM.   Given that schedule, it is not surprizing that she has had a few hypoglycemic events in the mid morning to noontime.       Review of Systems     Objective:   Physical ExamLungs clear Cardiac RRR without m or g Abd benign Ext 3+ edema to thigh.        Assessment & Plan:

## 2013-03-05 NOTE — Assessment & Plan Note (Signed)
Restart lasix given obvious volume overload.

## 2013-03-05 NOTE — Assessment & Plan Note (Signed)
Poor control.  Given obvious volume overload on exam, will restart furosemide and recheck with BMP in one month

## 2013-03-05 NOTE — Assessment & Plan Note (Signed)
Increase gabapentin for her nerve pain.

## 2013-03-05 NOTE — Assessment & Plan Note (Signed)
Will adjust insulin to better fit her meal schedule.

## 2013-04-10 ENCOUNTER — Encounter: Payer: Self-pay | Admitting: Family Medicine

## 2013-04-10 ENCOUNTER — Ambulatory Visit (INDEPENDENT_AMBULATORY_CARE_PROVIDER_SITE_OTHER): Payer: 59 | Admitting: Family Medicine

## 2013-04-10 VITALS — BP 158/92 | HR 70 | Temp 98.1°F | Wt 302.0 lb

## 2013-04-10 DIAGNOSIS — E1149 Type 2 diabetes mellitus with other diabetic neurological complication: Secondary | ICD-10-CM

## 2013-04-10 DIAGNOSIS — I10 Essential (primary) hypertension: Secondary | ICD-10-CM

## 2013-04-10 DIAGNOSIS — N182 Chronic kidney disease, stage 2 (mild): Secondary | ICD-10-CM

## 2013-04-10 LAB — BASIC METABOLIC PANEL
BUN: 23 mg/dL (ref 6–23)
Chloride: 101 mEq/L (ref 96–112)
Creat: 1.01 mg/dL (ref 0.50–1.10)
Glucose, Bld: 188 mg/dL — ABNORMAL HIGH (ref 70–99)
Potassium: 4.7 mEq/L (ref 3.5–5.3)

## 2013-04-10 MED ORDER — AMLODIPINE BESYLATE 5 MG PO TABS: 5.0000 mg | ORAL_TABLET | Freq: Every day | ORAL | Status: DC

## 2013-04-10 NOTE — Assessment & Plan Note (Signed)
Recheck creat. 

## 2013-04-10 NOTE — Assessment & Plan Note (Signed)
Likely neuropathy.  May have element of radiculopathy from her back pain.  Arthritis may also be playing a role.  For now, will just increase gabapentin.

## 2013-04-10 NOTE — Assessment & Plan Note (Signed)
Poor control

## 2013-04-10 NOTE — Patient Instructions (Signed)
I added a new blood pressure pill, amlodipine.  Start it first. You can increase your gabapentin on your own up to 600 mg (two pills) three times per day.  Drowsiness or mental fogginess is the most common side effect. See me in Jan.   I will call with lab results.

## 2013-04-10 NOTE — Progress Notes (Signed)
   Subjective:    Patient ID: Rebecca Hicks, female    DOB: [birth year], 67 y.o.   MRN: [id number]  HPI  Patient continues to have leg pain Rt>Lt.  Gabapentin helping some.  States BS are running better.  BPs running high. I have her attention now with the mild worsening of renal problems.  She has three sibs already on dialysis (one died).  Obviously wants to avoid.    Review of Systems     Objective:   Physical Exam High BP confirmed. Leg no lesions  Pain in calf.         Assessment & Plan:

## 2013-04-20 ENCOUNTER — Other Ambulatory Visit: Payer: Self-pay | Admitting: Family Medicine

## 2013-04-29 ENCOUNTER — Other Ambulatory Visit: Payer: Self-pay | Admitting: Sports Medicine

## 2013-04-29 DIAGNOSIS — M79604 Pain in right leg: Secondary | ICD-10-CM

## 2013-04-30 ENCOUNTER — Other Ambulatory Visit: Payer: 59

## 2013-05-03 ENCOUNTER — Ambulatory Visit
Admission: RE | Admit: 2013-05-03 | Discharge: 2013-05-03 | Disposition: A | Discharge status: Home / Self Care | Payer: 59 | Source: Ambulatory Visit | Attending: Sports Medicine | Admitting: Sports Medicine

## 2013-05-03 ENCOUNTER — Other Ambulatory Visit: Payer: 59

## 2013-05-03 DIAGNOSIS — M79604 Pain in right leg: Secondary | ICD-10-CM

## 2013-05-08 ENCOUNTER — Ambulatory Visit: Payer: 59 | Admitting: Family Medicine

## 2013-05-20 ENCOUNTER — Ambulatory Visit (INDEPENDENT_AMBULATORY_CARE_PROVIDER_SITE_OTHER): Payer: 59 | Admitting: Family Medicine

## 2013-05-20 ENCOUNTER — Encounter: Payer: Self-pay | Admitting: Family Medicine

## 2013-05-20 VITALS — BP 160/80 | HR 56 | Temp 97.5°F | Ht 66.0 in | Wt 294.5 lb

## 2013-05-20 DIAGNOSIS — I10 Essential (primary) hypertension: Secondary | ICD-10-CM

## 2013-05-20 DIAGNOSIS — E1165 Type 2 diabetes mellitus with hyperglycemia: Secondary | ICD-10-CM

## 2013-05-20 DIAGNOSIS — IMO0001 Reserved for inherently not codable concepts without codable children: Secondary | ICD-10-CM

## 2013-05-20 DIAGNOSIS — IMO0002 Reserved for concepts with insufficient information to code with codable children: Secondary | ICD-10-CM

## 2013-05-20 DIAGNOSIS — M545 Low back pain, unspecified: Secondary | ICD-10-CM

## 2013-05-20 MED ORDER — INSULIN REGULAR HUMAN 100 UNIT/ML IJ SOLN: INTRAMUSCULAR | Status: DC

## 2013-05-20 MED ORDER — AMLODIPINE BESYLATE 10 MG PO TABS: 10.0000 mg | ORAL_TABLET | Freq: Every day | ORAL | Status: DC

## 2013-05-20 MED ORDER — ZOSTER VACCINE LIVE 19400 UNT/0.65ML ~~LOC~~ SOLR: 0.6500 mL | Freq: Once | SUBCUTANEOUS | Status: DC

## 2013-05-20 NOTE — Assessment & Plan Note (Signed)
Poor control.  Increase amlodipine.   

## 2013-05-20 NOTE — Progress Notes (Signed)
   Subjective:    Patient ID: Rebecca Hicks, female    DOB: [birth year], 69 y.o.   MRN: [id number]  HPI Several issues.  Bad back pain.  Seeing ortho and has been on oral steroids and now receiving epidural steroid shots.   BS are high BP is high Back pain has limited activity She is planning a LOA from work because of her back pain.    Review of Systems     Objective:   Physical Exam Lungs clear Cardiac RRR without m or g Low back diffusely tender. Gait normal other than slow to move and rise from chair.       Assessment & Plan:

## 2013-05-20 NOTE — Patient Instructions (Signed)
I sent the prescription for the shingles vaccine.  Let me know when you get it to update your records. Also make sure I get a copy of your eye exam. I am increasing your amlodipine from 5 to 10 mg a day.  You can use up your current supply by taking two pills. I sent a prescription for a short acting insulin that I want you to take before lunch.  We will figure out what dose you need.  Start with 5 units.   See me in 6-8 weeks.

## 2013-05-20 NOTE — Assessment & Plan Note (Signed)
Significant DDD under care of ortho - [facility].  Both steroids and lack of activity make chronic med problems worse. Recommend water aerobics.

## 2013-05-20 NOTE — Assessment & Plan Note (Signed)
States mid day sugars are highest.  Will try regular insulin with noon meal.

## 2013-05-22 ENCOUNTER — Telehealth: Payer: Self-pay | Admitting: Family Medicine

## 2013-05-22 DIAGNOSIS — E1165 Type 2 diabetes mellitus with hyperglycemia: Secondary | ICD-10-CM

## 2013-05-22 DIAGNOSIS — IMO0002 Reserved for concepts with insufficient information to code with codable children: Secondary | ICD-10-CM

## 2013-05-22 NOTE — Telephone Encounter (Signed)
Pt called and wanted Dr. Andria Frames to know that the new insulin that he called in for her is 40.00 a vile. This is a little to much and would like something else called in. jw

## 2013-05-22 NOTE — Telephone Encounter (Signed)
Dear Dema Severin Team Is she talking about the Novolin R (aka Humalog R)..If these abnormal clinical findings persist, appropriate workup will be completed. The patient understands that follow up is required to elucidate the situation. So, I will send this to his desk and he will be back next week---until then continue using the other as before [facility]! Dorcas Mcmurray

## 2013-05-25 NOTE — Telephone Encounter (Signed)
Called pharmacy.  They don't know anything cheaper.  Called patient and suggested she contact her insurance company to learn the least expensive short acting insulin per her specific plan.

## 2013-05-25 NOTE — Telephone Encounter (Signed)
Patient states Dr Andria Frames gave her a long acting insulin,the insulin prescribed was to expensive.Patient request something less expensive otherwise states she'll continue with what she has.please advise. Rebecca Hicks, Rebecca Hicks

## 2013-06-01 ENCOUNTER — Other Ambulatory Visit: Payer: Self-pay | Admitting: *Deleted

## 2013-06-01 DIAGNOSIS — E1165 Type 2 diabetes mellitus with hyperglycemia: Secondary | ICD-10-CM

## 2013-06-01 DIAGNOSIS — IMO0002 Reserved for concepts with insufficient information to code with codable children: Secondary | ICD-10-CM

## 2013-06-01 DIAGNOSIS — I10 Essential (primary) hypertension: Secondary | ICD-10-CM

## 2013-06-01 MED ORDER — AMLODIPINE BESYLATE 10 MG PO TABS: 10.0000 mg | ORAL_TABLET | Freq: Every day | ORAL | Status: DC

## 2013-06-01 MED ORDER — INSULIN REGULAR HUMAN 100 UNIT/ML IJ SOLN: INTRAMUSCULAR | Status: DC

## 2013-06-01 NOTE — Addendum Note (Signed)
Addended by: Zenia Resides on: 06/01/2013 02:12 PM   Modules accepted: Orders, Medications

## 2013-06-01 NOTE — Telephone Encounter (Signed)
Patient calls, states her insurance covered Humulin R. It is Tier 1 with no copay.

## 2013-07-20 ENCOUNTER — Other Ambulatory Visit: Payer: Self-pay | Admitting: Family Medicine

## 2013-07-20 DIAGNOSIS — M545 Low back pain, unspecified: Secondary | ICD-10-CM

## 2013-07-20 DIAGNOSIS — I10 Essential (primary) hypertension: Secondary | ICD-10-CM

## 2013-07-20 MED ORDER — GABAPENTIN 300 MG PO CAPS: 300.0000 mg | ORAL_CAPSULE | Freq: Three times a day (TID) | ORAL | Status: DC

## 2013-07-20 MED ORDER — AMLODIPINE BESYLATE 10 MG PO TABS: 10.0000 mg | ORAL_TABLET | Freq: Every day | ORAL | Status: DC

## 2013-08-07 ENCOUNTER — Encounter: Payer: Self-pay | Admitting: Family Medicine

## 2013-08-07 NOTE — Progress Notes (Signed)
   Subjective:    Patient ID: Rebecca Hicks, female    DOB: 1945/07/01, 68 y.o.   MRN: [id number]  HPI Patient seeing podiatrist.  Received forms from his office and faxed back for diabetic shoes.  Called patient and verified request.    Review of Systems     Objective:   Physical Exam        Assessment & Plan:

## 2013-09-02 ENCOUNTER — Encounter: Payer: Self-pay | Admitting: Neurology

## 2013-09-02 ENCOUNTER — Ambulatory Visit (INDEPENDENT_AMBULATORY_CARE_PROVIDER_SITE_OTHER): Payer: 59 | Admitting: Neurology

## 2013-09-02 VITALS — Ht 65.0 in | Wt 311.0 lb

## 2013-09-02 DIAGNOSIS — M545 Low back pain, unspecified: Secondary | ICD-10-CM

## 2013-09-02 DIAGNOSIS — D518 Other vitamin B12 deficiency anemias: Secondary | ICD-10-CM

## 2013-09-02 DIAGNOSIS — E1149 Type 2 diabetes mellitus with other diabetic neurological complication: Secondary | ICD-10-CM

## 2013-09-02 DIAGNOSIS — G544 Lumbosacral root disorders, not elsewhere classified: Secondary | ICD-10-CM

## 2013-09-02 NOTE — Patient Instructions (Signed)
Back Pain, Adult Low back pain is very common. About 1 in 5 people have back pain.The cause of low back pain is rarely dangerous. The pain often gets better over time.About half of people with a sudden onset of back pain feel better in just 2 weeks. About 8 in 10 people feel better by 6 weeks.  CAUSES Some common causes of back pain include:  Strain of the muscles or ligaments supporting the spine.  Wear and tear (degeneration) of the spinal discs.  Arthritis.  Direct injury to the back. DIAGNOSIS Most of the time, the direct cause of low back pain is not known.However, back pain can be treated effectively even when the exact cause of the pain is unknown.Answering your caregiver's questions about your overall health and symptoms is one of the most accurate ways to make sure the cause of your pain is not dangerous. If your caregiver needs more information, he or she may order lab work or imaging tests (X-rays or MRIs).However, even if imaging tests show changes in your back, this usually does not require surgery. HOME CARE INSTRUCTIONS For many people, back pain returns.Since low back pain is rarely dangerous, it is often a condition that people can learn to manageon their own.   Remain active. It is stressful on the back to sit or stand in one place. Do not sit, drive, or stand in one place for more than 30 minutes at a time. Take short walks on level surfaces as soon as pain allows.Try to increase the length of time you walk each day.  Do not stay in bed.Resting more than 1 or 2 days can delay your recovery.  Do not avoid exercise or work.Your body is made to move.It is not dangerous to be active, even though your back may hurt.Your back will likely heal faster if you return to being active before your pain is gone.  Pay attention to your body when you bend and lift. Many people have less discomfortwhen lifting if they bend their knees, keep the load close to their bodies,and  avoid twisting. Often, the most comfortable positions are those that put less stress on your recovering back.  Find a comfortable position to sleep. Use a firm mattress and lie on your side with your knees slightly bent. If you lie on your back, put a pillow under your knees.  Only take over-the-counter or prescription medicines as directed by your caregiver. Over-the-counter medicines to reduce pain and inflammation are often the most helpful.Your caregiver may prescribe muscle relaxant drugs.These medicines help dull your pain so you can more quickly return to your normal activities and healthy exercise.  Put ice on the injured area.  Put ice in a plastic bag.  Place a towel between your skin and the bag.  Leave the ice on for 15-20 minutes, 03-04 times a day for the first 2 to 3 days. After that, ice and heat may be alternated to reduce pain and spasms.  Ask your caregiver about trying back exercises and gentle massage. This may be of some benefit.  Avoid feeling anxious or stressed.Stress increases muscle tension and can worsen back pain.It is important to recognize when you are anxious or stressed and learn ways to manage it.Exercise is a great option. SEEK MEDICAL CARE IF:  You have pain that is not relieved with rest or medicine.  You have pain that does not improve in 1 week.  You have new symptoms.  You are generally not feeling well. SEEK   IMMEDIATE MEDICAL CARE IF:   You have pain that radiates from your back into your legs.  You develop new bowel or bladder control problems.  You have unusual weakness or numbness in your arms or legs.  You develop nausea or vomiting.  You develop abdominal pain.  You feel faint. Document Released: 04/09/2005 Document Revised: 10/09/2011 Document Reviewed: 08/28/2010 [employer] Patient Information 2014 [employer].  

## 2013-09-02 NOTE — Progress Notes (Signed)
Reason for visit: Leg pain and weakness  Rebecca Hicks is a 69 y.o. female  History of present illness:  Ms. Bankson is a 68 year old right-handed black female with a history of morbid obesity and diabetes. 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 In December 2014, the patient had onset of discomfort in the right thigh from the hip level down to the knee with some back pain as well. She noted weakness with hip flexion, to the point where she was unable to drive a car because she could not transfer her right leg from the accelerator to the brake. She has been getting some physical therapy for her right leg weakness, and she indicates that she has not had any falls. 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 The patient has undergone a series of epidural steroid injections with some benefit. She indicates that the pain in the right eye is improving. Within the last 2 months, the patient has developed some weakness of the left leg as well, without the significant pain. The patient denies any numbness of the legs. She does report a 17 pound weight loss prior to onset of the right thigh weakness, but she was trying to lose weight at that time. She denies problems controlling the bowels or the bladder. She denies any neck pain or pain down arms.  Past Medical History  Diagnosis Date  . Sarcoid   . Hypertension   . Diabetes mellitus   . Hyperlipidemia   . Morbid obesity   . Polyneuropathy in diabetes(357.2)     Past Surgical History  Procedure Laterality Date  . Abdominal hysterectomy    . Spine surgery      lumbar laminectomy X 2  . Appendectomy    . Eye surgery    . Cataract  extraction, bilateral  03/2010, 04/2010  . Tonsillectomy      Family History  Problem Relation Age of Onset  . Hypertension      siblings  . Diabetes      siblings  . Heart disease Brother   . Cervical cancer Mother   . Stroke Father   . Alcohol abuse Father   . Breast cancer Maternal Aunt   . Stroke Paternal Grandmother   . Alcohol abuse Brother   . Pancreatic cancer Maternal Uncle     Social history:  reports that she quit smoking about 18 years ago. Her smoking use included Cigarettes. She has a 20 pack-year smoking history. She has never used smokeless tobacco. She reports that she does not drink alcohol or use illicit drugs.  Medications:  Current Outpatient Prescriptions on File Prior to Visit  Medication Sig Dispense Refill  . amLODipine (NORVASC) 10 MG tablet Take 1 tablet (10 mg total) by mouth daily.  90 tablet  3  . aspirin 81 MG tablet Take 81 mg by mouth daily.        . BD ULTRA-FINE PEN NEEDLES 29G X 12.7MM MISC Use two times daily  180 each  3  . diphenoxylate-atropine (LOMOTIL) 2.5-0.025 MG per tablet Take 1 tablet by mouth 4 (four) times daily as needed.  120 tablet  6  .  furosemide (LASIX) 20 MG tablet Take 1 tablet (20 mg total) by mouth daily.  30 tablet  3  . gabapentin (NEURONTIN) 300 MG capsule Take 1 capsule (300 mg total) by mouth 3 (three) times daily.  270 capsule  3  . insulin aspart protamine- aspart (NOVOLOG MIX 70/30) (70-30) 100 UNIT/ML injection Inject 0.65 mLs (65 Units total) into the skin 2 (two) times daily. Intent is a 90 day supply of 70/30 insulin that her insurance covers.  120 mL  12  . metFORMIN (GLUCOPHAGE) 1000 MG tablet Take 1 tablet twice a day  180 tablet  3  . metoprolol (TOPROL XL) 200 MG 24 hr tablet Take 1 tablet (200 mg total) by mouth daily.  90 tablet  3  . ONE TOUCH ULTRA TEST test strip Test 3 times daily  300 each  3  . telmisartan-hydrochlorothiazide (MICARDIS HCT) 80-12.5 MG per tablet Take 1 tablet by mouth  daily  90  tablet  3  . timolol (TIMOPTIC) 0.25 % ophthalmic solution 2 (two) times daily. Per optho       . rosuvastatin (CRESTOR) 40 MG tablet Take 1 tablet (40 mg total) by mouth daily.  90 tablet  3  . zoster vaccine live, PF, (ZOSTAVAX) 66440 UNT/0.65ML injection Inject 19,400 Units into the skin once.  1 each  0   No current facility-administered medications on file prior to visit.      Allergies  Allergen Reactions  . Penicillins     ROS:  Out of a complete 14 system review of symptoms, the patient complains only of the following symptoms, and all other reviewed systems are negative.  Swelling in the legs Blurred vision Joint pain, joint swelling, aching muscles  Height 5\' 5"  (1.651 m), weight 311 lb (141.069 kg).  Physical Exam  General: The patient is alert and cooperative at the time of the examination. The patient is morbidly obese.  Eyes: Pupils are equal, round, and reactive to light. Discs are flat bilaterally.  Neck: The neck is supple, no carotid bruits are noted.  Respiratory: The respiratory examination is clear.  Cardiovascular: The cardiovascular examination reveals a regular rate and rhythm, no obvious murmurs or rubs are noted.  Skin: Extremities are with 2+ edema below the knees bilaterally.  Neurologic Exam  Mental status: The patient is alert and oriented x 3 at the time of the examination. The patient has apparent normal recent and remote memory, with an apparently normal attention span and concentration ability.  Cranial nerves: Facial symmetry is present. There is good sensation of the face to pinprick and soft touch bilaterally. The strength of the facial muscles and the muscles to head turning and shoulder shrug are normal bilaterally. Speech is well enunciated, no aphasia or dysarthria is noted. Extraocular movements are full. Visual fields are full. The tongue is midline, and the patient has symmetric elevation of the soft palate. No obvious hearing  deficits are noted.  Motor: The motor testing reveals 5 over 5 strength of all 4 extremities, With exception that there is 4 minus/5 strength with hip flexion bilaterally. The patient has a footdrop on the left, with eversion weakness of the left foot, not present on the right. Good symmetric motor tone is noted throughout.  Sensory: Sensory testing is intact to pinprick, soft touch, vibration sensation, and position sense on all 4 extremities, with exception that there is some decrease in vibration sensation on the left foot, with some stocking pattern pinprick sensory deficit in the  distal half of the left leg below the knee. No evidence of extinction is noted.  Coordination: Cerebellar testing reveals good finger-nose-finger and heel-to-shin bilaterally.  Gait and station: Gait is slightly wide-based, the patient has a lot of difficulty arising from a seated position. Tandem gait was not attempted.  Romberg is negative. No drift is seen.  Reflexes: Deep tendon reflexes are symmetric  but are depressed bilaterally. Toes are downgoing bilaterally.   Assessment/Plan:  1. Morbid obesity  2. Diabetes  3. Bilateral lower extremity weakness  4. Left footdrop, peroneal neuropathy  The patient appears to have bilateral hip flexion weakness, and a left sided footdrop. The patient has a known left peroneal neuropathy that appears to still be present. With onset of the right leg pain and weakness and subsequent weakness on the left side, the possibility of a diabetic amyotrophy needs to be considered. The patient will be set up for nerve conduction studies of both legs, and one arm and EMG evaluations of both legs. The patient indicates that she is improving with the strength of the right leg.  Jill Alexanders MD 09/02/2013 8:11 PM  [facility] [address] [place], [place] [address]  Phone [phone] Fax [phone]

## 2013-09-04 ENCOUNTER — Other Ambulatory Visit (INDEPENDENT_AMBULATORY_CARE_PROVIDER_SITE_OTHER): Payer: 59

## 2013-09-04 DIAGNOSIS — Z0289 Encounter for other administrative examinations: Secondary | ICD-10-CM

## 2013-09-09 ENCOUNTER — Other Ambulatory Visit: Payer: Self-pay | Admitting: Family Medicine

## 2013-09-09 LAB — LYME, TOTAL AB TEST/REFLEX: Lyme IgG/IgM Ab: <0.91 {ISR} (ref 0.00–0.90)

## 2013-09-09 LAB — IFE AND PE, SERUM
ALBUMIN/GLOB SERPL: 1.1 (ref 0.7–2.0)
Albumin SerPl Elph-Mcnc: 3.3 g/dL (ref 3.2–5.6)
Alpha 1: 0.2 g/dL (ref 0.1–0.4)
Alpha2 Glob SerPl Elph-Mcnc: 1 g/dL (ref 0.4–1.2)
B-GLOBULIN SERPL ELPH-MCNC: 1.2 g/dL (ref 0.6–1.3)
GAMMA GLOB SERPL ELPH-MCNC: 0.8 g/dL (ref 0.5–1.6)
GLOBULIN, TOTAL: 3.1 g/dL (ref 2.0–4.5)
IGG (IMMUNOGLOBIN G), SERUM: 983 mg/dL (ref 700–1600)
IgA/Immunoglobulin A, Serum: 350 mg/dL (ref 91–414)
IgM (Immunoglobulin M), Srm: 43 mg/dL (ref 40–230)
Total Protein: 6.4 g/dL (ref 6.0–8.5)

## 2013-09-09 LAB — ANA W/REFLEX: ANA: NEGATIVE

## 2013-09-09 LAB — RHEUMATOID FACTOR: Rhuematoid fact SerPl-aCnc: 8.4 IU/mL (ref 0.0–13.9)

## 2013-09-09 LAB — VITAMIN B12: Vitamin B-12: 666 pg/mL (ref 211–946)

## 2013-09-09 LAB — ANGIOTENSIN CONVERTING ENZYME: ANGIO CONVERT ENZYME: 57 U/L (ref 14–82)

## 2013-09-10 ENCOUNTER — Ambulatory Visit (INDEPENDENT_AMBULATORY_CARE_PROVIDER_SITE_OTHER): Payer: 59 | Admitting: Neurology

## 2013-09-10 ENCOUNTER — Encounter (INDEPENDENT_AMBULATORY_CARE_PROVIDER_SITE_OTHER): Payer: 59 | Admitting: Radiology

## 2013-09-10 DIAGNOSIS — Z0289 Encounter for other administrative examinations: Secondary | ICD-10-CM

## 2013-09-10 DIAGNOSIS — M545 Low back pain, unspecified: Secondary | ICD-10-CM

## 2013-09-10 DIAGNOSIS — G544 Lumbosacral root disorders, not elsewhere classified: Secondary | ICD-10-CM

## 2013-09-10 DIAGNOSIS — E1149 Type 2 diabetes mellitus with other diabetic neurological complication: Secondary | ICD-10-CM

## 2013-09-10 DIAGNOSIS — G5600 Carpal tunnel syndrome, unspecified upper limb: Secondary | ICD-10-CM

## 2013-09-10 DIAGNOSIS — G5620 Lesion of ulnar nerve, unspecified upper limb: Secondary | ICD-10-CM

## 2013-09-10 NOTE — Progress Notes (Signed)
Quick Note:  Shared unremarkable results with patient and she verbalized understanding ______ 

## 2013-09-10 NOTE — Progress Notes (Signed)
Rebecca Hicks is a 68 year old patient with a history of diabetes and morbid obesity. She has had lumbosacral spine surgery done on 2 occasions, and she has a chronic known left footdrop. She has noted onset of right leg weakness in December 2014 that is now gradually improving. She has noted onset of left leg weakness as well. The patient is being evaluated for possible neuropathy, lumbosacral radiculopathy, or diabetic amyotrophy.  Nerve conduction studies done today show evidence of a mild right carpal tunnel syndrome, right tardy ulnar palsy. No clear diabetic neuropathy is seen as the sensory latencies in the legs were normal. EMG evaluation shows a chronic stable L5 and S1 radiculopathy on the left. There is evidence of a chronic stable L5 radiculopathy on the right, with some possible involvement of the L2-3 level and a chronic fashion as well. No acute denervation is seen, no evidence of a diabetic amyotrophy is seen.  The patient is getting physical therapy, believes that she is improving. Now able to drive a car that she is able to lift her right leg.  The patient will be followed conservatively, followup in 3 months.

## 2013-09-10 NOTE — Procedures (Signed)
HISTORY:  Rebecca Hicks is a 68 year old patient with a history of morbid obesity and a history of diabetes. The patient has had onset of bilateral lower extremity weakness with the right leg being affected in December 2014, eventually also affecting the left leg. The patient reports proximal muscle weakness. The patient has a known left footdrop that is chronic. She has a history of lumbosacral spine surgery on 2 occasions in the past. The patient is being evaluated for possible lumbosacral radiculopathy or a possible diabetic amyotrophy.  NERVE CONDUCTION STUDIES:  Nerve conduction studies were performed on the right upper extremity. The distal motor latency for the right median nerve was prolonged, with a normal motor amplitude. The distal motor latency and motor amplitude for the right ulnar nerve is normal. The F wave latency for the right median nerve was normal, slightly prolonged for the right ulnar nerve. The nerve conduction velocities for the right median nerve was normal, slowed above the elbow for the right ulnar nerve. The sensory latencies for the right median nerve and right radial nerves were prolonged. The right ulnar sensory latency was unobtainable.  Nerve conduction studies performed on both lower extremities shows no response for the left peroneal nerve, with a normal distal motor latency for the right peroneal nerve. The motor amplitude for the right peroneal nerve was low. The distal motor latencies for the posterior tibial nerves were normal, with low motor amplitudes for these nerves bilaterally. The nerve conduction velocities for the right peroneal nerve and for the posterior tibial nerves bilaterally were normal. The F wave latencies for the right peroneal nerve and for the posterior tibial nerves bilaterally were prolonged. The peroneal sensory latencies were normal bilaterally.  EMG STUDIES:  EMG study was performed on the left lower extremity:  The tibialis  anterior muscle reveals up to 8K motor units with moderately decreased recruitment. No fibrillations or positive waves were seen. The peroneus tertius muscle reveals up to 8K motor units with markedly decreased recruitment. 2+ fibrillations and positive waves were seen. The medial gastrocnemius muscle reveals 2 to 4K motor units with decreased recruitment. No fibrillations or positive waves were seen. The vastus lateralis muscle reveals 2 to 4K motor units with full recruitment. No fibrillations or positive waves were seen. The iliopsoas muscle reveals 2 to 4K motor units with full recruitment. No fibrillations or positive waves were seen. The biceps femoris muscle (long head) reveals 2 to 5K motor units with decreased recruitment. No fibrillations or positive waves were seen. The lumbosacral paraspinal muscles were tested at 3 levels, and revealed no abnormalities of insertional activity at all 3 levels tested. There was fair relaxation.  EMG study was performed on the right lower extremity:  The tibialis anterior muscle reveals 2 to 6K motor units with moderately decreased recruitment. No fibrillations or positive waves were seen. The peroneus tertius muscle reveals 2 to 6K motor units with markedly decreased recruitment. No fibrillations or positive waves were seen. The medial gastrocnemius muscle reveals 1 to 3K motor units with full recruitment. No fibrillations or positive waves were seen. The vastus lateralis muscle reveals 2 to 4K motor units with full recruitment. No fibrillations or positive waves were seen. The iliopsoas muscle reveals 2 to 5K motor units with decreased recruitment. No fibrillations or positive waves were seen. The biceps femoris muscle (long head) reveals 2 to 5K motor units with decreased recruitment. No fibrillations or positive waves were seen. The lumbosacral paraspinal muscles were tested at 3  levels, and revealed no abnormalities of insertional activity at all 3  levels tested. There was good relaxation.   IMPRESSION:  Nerve conduction studies done on the right upper extremity and both lower extremities shows evidence of motor dysfunction of the peroneal and posterior tibial nerves bilaterally, with sensory sparing. Findings as this may be associated with an overlying lumbosacral radiculopathy. 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 EMG evaluation of the left lower extremity shows findings consistent with a chronic stable L5 and S1 radiculopathy. EMG study on the right lower extremity shows findings consistent with a chronic stable L5 radiculopathy, with some involvement as well of the L2 or L3 nerve root that is chronic and stable. There is no evidence of an overlying diabetic amyotrophy on either side. No acute denervation was seen.  Jill Alexanders MD 09/10/2013 10:59 AM  [facility] [address] [place], [place] [address]  Phone [phone] Fax [phone]

## 2013-10-30 ENCOUNTER — Encounter: Payer: Self-pay | Admitting: Family Medicine

## 2013-10-30 ENCOUNTER — Ambulatory Visit (INDEPENDENT_AMBULATORY_CARE_PROVIDER_SITE_OTHER): Payer: 59 | Admitting: Family Medicine

## 2013-10-30 VITALS — BP 160/50 | HR 66 | Temp 98.1°F | Ht 65.0 in | Wt 305.5 lb

## 2013-10-30 DIAGNOSIS — E1165 Type 2 diabetes mellitus with hyperglycemia: Secondary | ICD-10-CM

## 2013-10-30 DIAGNOSIS — E785 Hyperlipidemia, unspecified: Secondary | ICD-10-CM

## 2013-10-30 DIAGNOSIS — IMO0001 Reserved for inherently not codable concepts without codable children: Secondary | ICD-10-CM

## 2013-10-30 DIAGNOSIS — I10 Essential (primary) hypertension: Secondary | ICD-10-CM

## 2013-10-30 DIAGNOSIS — IMO0002 Reserved for concepts with insufficient information to code with codable children: Secondary | ICD-10-CM

## 2013-10-30 LAB — POCT GLYCOSYLATED HEMOGLOBIN (HGB A1C): HEMOGLOBIN A1C: 8.9

## 2013-10-30 MED ORDER — FUROSEMIDE 20 MG PO TABS: 20.0000 mg | ORAL_TABLET | Freq: Every day | ORAL | Status: DC

## 2013-10-30 MED ORDER — DIPHENOXYLATE-ATROPINE 2.5-0.025 MG PO TABS: 1.0000 | ORAL_TABLET | Freq: Four times a day (QID) | ORAL | Status: DC | PRN

## 2013-10-30 NOTE — Progress Notes (Signed)
   Subjective:    Patient ID: Rebecca Hicks, female    DOB: 04/12/1946, 68 y.o.   MRN: [id number]  HPI Rebecca Hicks is emerging from a difficult spell with her back.  She is finally returning to her baseline after 6 months of trouble.  Seen by Drs Rebecca Hicks and Rebecca Hicks.  Received steroids both oral and injections.  She has not been taking her lasix of her crestor regularly.  Much of the last 6 months she had very limited activity.  Now getting more active and eating healthier diet.      Review of Systems     Objective:   Physical ExamLungs clear Cardiac RRR without m or g 3+ bilateral ankle edema        Assessment & Plan:

## 2013-10-30 NOTE — Patient Instructions (Addendum)
I am delighted that your back is feeling better.  It is time to get more active and pay better attention to your diet.   I will call Monday with the blood work results. Take the furosemide/lasix every morning - you are retaining too much fluid.  Of course, continue to watch the salt. Take the crestor every day - it prevents heart attacks and strokes.   See me in three months.  Don't forget the mammogram and shingles vaccine. Be sure I get a copy of your eye exam.

## 2013-10-30 NOTE — Assessment & Plan Note (Signed)
No change in insulin dose for now.  Increase activity.  Stay off the steroids.

## 2013-10-30 NOTE — Assessment & Plan Note (Signed)
Get back on crestor regularly.  Check LDL next visit.

## 2013-10-30 NOTE — Assessment & Plan Note (Signed)
Suboptimal control and fluid retention.  Take lasix daily.

## 2013-10-31 LAB — BASIC METABOLIC PANEL
BUN: 28 mg/dL — ABNORMAL HIGH (ref 6–23)
CHLORIDE: 97 meq/L (ref 96–112)
CO2: 27 mEq/L (ref 19–32)
CREATININE: 1.14 mg/dL — AB (ref 0.50–1.10)
Calcium: 9.6 mg/dL (ref 8.4–10.5)
Glucose, Bld: 186 mg/dL — ABNORMAL HIGH (ref 70–99)
POTASSIUM: 4.8 meq/L (ref 3.5–5.3)
Sodium: 134 mEq/L — ABNORMAL LOW (ref 135–145)

## 2013-11-02 NOTE — Addendum Note (Signed)
Addended by: Zenia Resides on: 11/02/2013 12:09 PM   Modules accepted: Orders

## 2013-11-02 NOTE — Progress Notes (Signed)
Patient ID: Rebecca Hicks, female   DOB: 1946/02/08, 68 y.o.   MRN: [id number] In discussing lab results, [name] and I agree that a referral to diabetes and nutrition management would be beneficial.

## 2013-11-25 LAB — HM DIABETES EYE EXAM

## 2013-12-16 ENCOUNTER — Ambulatory Visit: Payer: 59 | Admitting: *Deleted

## 2013-12-21 ENCOUNTER — Telehealth: Payer: Self-pay | Admitting: Family Medicine

## 2013-12-21 MED ORDER — ROSUVASTATIN CALCIUM 40 MG PO TABS: 40.0000 mg | ORAL_TABLET | Freq: Every day | ORAL | Status: DC

## 2013-12-21 NOTE — Telephone Encounter (Signed)
Forward to PCP for refill.Bowie Delia, Kevin Fenton

## 2013-12-21 NOTE — Telephone Encounter (Signed)
Pt called and would a refill on her Crestot sent in to her pharmacy. jw

## 2013-12-21 NOTE — Telephone Encounter (Signed)
Rx sent as requested.

## 2013-12-25 ENCOUNTER — Ambulatory Visit: Payer: 59 | Admitting: *Deleted

## 2014-01-18 ENCOUNTER — Telehealth: Payer: Self-pay

## 2014-01-18 ENCOUNTER — Other Ambulatory Visit: Payer: Self-pay | Admitting: Family Medicine

## 2014-01-18 NOTE — Telephone Encounter (Signed)
I called home, cell and work numbers.  No answer.  I left messages at each that she needs to be seen today in ER or Urgent Care.

## 2014-01-18 NOTE — Telephone Encounter (Signed)
Left voice message regarding pt having chest tightness/discomfort over the weekend.  Pt to return nurse call.  Derl Barrow, RN

## 2014-02-05 ENCOUNTER — Other Ambulatory Visit: Payer: Self-pay

## 2014-02-17 ENCOUNTER — Ambulatory Visit (INDEPENDENT_AMBULATORY_CARE_PROVIDER_SITE_OTHER): Payer: 59

## 2014-02-17 ENCOUNTER — Ambulatory Visit (INDEPENDENT_AMBULATORY_CARE_PROVIDER_SITE_OTHER): Payer: 59 | Admitting: Family Medicine

## 2014-02-17 VITALS — BP 134/82 | HR 67 | Temp 98.2°F | Resp 18 | Ht 66.0 in | Wt 305.0 lb

## 2014-02-17 DIAGNOSIS — I10 Essential (primary) hypertension: Secondary | ICD-10-CM

## 2014-02-17 DIAGNOSIS — E1149 Type 2 diabetes mellitus with other diabetic neurological complication: Secondary | ICD-10-CM

## 2014-02-17 DIAGNOSIS — E1140 Type 2 diabetes mellitus with diabetic neuropathy, unspecified: Secondary | ICD-10-CM

## 2014-02-17 DIAGNOSIS — R079 Chest pain, unspecified: Secondary | ICD-10-CM

## 2014-02-17 DIAGNOSIS — M62838 Other muscle spasm: Secondary | ICD-10-CM

## 2014-02-17 DIAGNOSIS — M542 Cervicalgia: Secondary | ICD-10-CM

## 2014-02-17 LAB — GLUCOSE, POCT (MANUAL RESULT ENTRY): POC Glucose: 132 mg/dl — AB (ref 70–99)

## 2014-02-17 MED ORDER — NITROGLYCERIN 0.4 MG SL SUBL: 0.4000 mg | SUBLINGUAL_TABLET | SUBLINGUAL | Status: DC | PRN

## 2014-02-17 MED ORDER — ISOSORBIDE MONONITRATE ER 30 MG PO TB24: 30.0000 mg | ORAL_TABLET | Freq: Every day | ORAL | Status: DC

## 2014-02-17 NOTE — Progress Notes (Addendum)
Subjective:    Patient ID: Rebecca Hicks, female    DOB: [birth year], 68 y.o.   MRN: [id number] This chart was scribed for [name] MD by Cathie Hoops, ED Scribe. The patient was seen in Room 5. The patient's care was started at 3;51.   02/17/2014  Chief Complaint  Patient presents with  . Neck Pain    "Stiff/painful" worse on the right than the left. Radiating up to ear and down to shoulder. x6 days  . Otalgia    R ear pain   . Chest Pain    "tightness" x2 weeks      HPI HPI Comments: This pt, 68 y.o, has history of Diabetes Mellitus (last evaluation on July 10- A1C 8.9), Sarcoidosis (ACE level 57 in may of this year), CKD (Creatnine 1.14 in July of this year), and other problems per problem list. Her primary provider is Dr. Andria Frames with [facility]. On review of [facility], phone note 9/28 indicating she had chest tightness over the weekend and was recommended to be seen in the ED or Urgent Care at that time. Here for a few concerns today.  [name] is a 68 y.o. female who [facility] complaining of new, constant, moderate and unchanged right-sided neck stiffness onset 6 days ago. Pt denies injury or trauma to her neck. Pt notes her symptoms began as right otalgia and sore throat.she notes her pain radiates down to the right shoulder. Her pain is worsened at night. She has been working with an Doctor, general practice for 6-8 months for back pain and she denies seeing the orthopedist for her neck. Pt denies phonophobia, photophobia, weakness or numbness.   Pt also complains of new, intermittent, moderate chest tightness onset one month ago. She states she last felt this pain one day ago. She describes her pain as pressure. When she does experience chest tightness she rates her pain as a 4/10. Pt had associated diaphoresis, voice change and cough. Pt does not remember any association with activity or exertion when the pain begins. Pt  notes her pain lasts for a few minutes per episode with it occurring every couple of days. Pt states she had diaphoresis with that episode but denies radiating pain, nausea or vomiting. She attempted to alleviate her symptoms by taking baby aspirin and laid down with moderate relief. She called Dr. Andria Frames on month ago when her symptoms began but was not seen because her aspirin alleviated her symptoms.She denies missing her BP medication doses. Pt's brother passed away from a MI from a kidney transplant. Pt's last cardiac work-up was approximately 7 years ago but is unable to specify the exact reason why she required a cardiac stress test. Pt last saw a cardiologist 7 years ago during her cardiac work-up. Pt notes her BP readings at home have been 150-160 over 90.Pt denies indigestion, SOB, nausea, vomiting, or radiating down the arm. Pt takes one baby aspirin everyday.   Pt states she checks her blood sugars everyday 140-160 in the morning and 200+ (normally 250) in the evenings. Pt states her peripheral neuropathy affects her feet only. Patient Active Problem List   Diagnosis Date Noted  . Chronic kidney disease (CKD), stage II (mild) 03/05/2013  . Acute on chronic renal failure 12/25/2012  . Abdominal pain, left upper quadrant 12/03/2012  . DIASTOLIC DYSFUNCTION 32/95/1884  . COLONIC POLYPS, ADENOMATOUS 05/03/2009  . GLAUCOMA NOS 02/25/2009  . DIABETIC PERIPHERAL NEUROPATHY 05/28/2008  .  LOW BACK PAIN SYNDROME 05/28/2008  . 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 DM (diabetes mellitus), type 2, uncontrolled 02/07/2007  . HYPERLIPIDEMIA 02/07/2007  . OBESITY 02/07/2007  . HYPERTENSION 02/07/2007  . TB SKIN TEST, POSITIVE 02/07/2007  . TOBACCO USE, QUIT 02/07/2007  . HYSTERECTOMY, HX OF 02/07/2007   Past Medical History  Diagnosis Date  . Sarcoid   . Hypertension   . Diabetes mellitus   . Hyperlipidemia   . Morbid obesity   . Polyneuropathy in diabetes(357.2)   . Arthritis   . Cataract     . Glaucoma   . Neuromuscular disorder    Past Surgical History  Procedure Laterality Date  . Abdominal hysterectomy    . Spine surgery      lumbar laminectomy X 2  . Appendectomy    . Eye surgery    . Cataract extraction, bilateral  03/2010, 04/2010  . Tonsillectomy     Allergies  Allergen Reactions  . Penicillins    Prior to Admission medications   Medication Sig Start Date End Date Taking? Authorizing Provider  amLODipine (NORVASC) 10 MG tablet Take 1 tablet (10 mg total) by mouth daily. 07/20/13  Yes Zigmund Gottron, MD  aspirin 81 MG tablet Take 81 mg by mouth daily.     Yes Historical Provider, MD  BD ULTRA-FINE PEN NEEDLES 29G X 12.7MM MISC Use two times daily 01/18/14  Yes Zigmund Gottron, MD  diphenoxylate-atropine (LOMOTIL) 2.5-0.025 MG per tablet Take 1 tablet by mouth 4 (four) times daily as needed for diarrhea or loose stools. 10/30/13  Yes Zigmund Gottron, MD  furosemide (LASIX) 20 MG tablet Take 1 tablet (20 mg total) by mouth daily. 10/30/13  Yes Zigmund Gottron, MD  gabapentin (NEURONTIN) 300 MG capsule Take 1 capsule (300 mg total) by mouth 3 (three) times daily. 07/20/13  Yes Zigmund Gottron, MD  insulin aspart protamine- aspart (NOVOLOG MIX 70/30) (70-30) 100 UNIT/ML injection Inject 60 Units into the skin 2 (two) times daily. Intent is a 90 day supply of 70/30 insulin that her insurance covers. 10/15/12  Yes Zigmund Gottron, MD  metFORMIN (GLUCOPHAGE) 1000 MG tablet Take 1 tablet twice a day 04/20/13  Yes Zigmund Gottron, MD  metoprolol (TOPROL-XL) 200 MG 24 hr tablet Take 1 tablet daily 01/18/14  Yes Zigmund Gottron, MD  ONE TOUCH ULTRA TEST test strip Test 3 times daily 01/21/13  Yes Zigmund Gottron, MD  rosuvastatin (CRESTOR) 40 MG tablet Take 1 tablet (40 mg total) by mouth daily. 12/21/13  Yes Zigmund Gottron, MD  telmisartan-hydrochlorothiazide (MICARDIS HCT) 80-12.5 MG per tablet Take 1 tablet by mouth   daily 01/21/13  Yes Zigmund Gottron, MD  timolol (TIMOPTIC) 0.25 % ophthalmic solution 2 (two) times daily. Per optho    Yes Historical Provider, MD  zoster vaccine live, PF, (ZOSTAVAX) 76283 UNT/0.65ML injection Inject 19,400 Units into the skin once. 05/20/13   Zigmund Gottron, MD   History   Social History  . Marital Status: Single    Spouse Name: N/A    Number of Children: 0  . Years of Education: college   Occupational History  . Manager Unemployed   Social History Main Topics  . Smoking status: Former Smoker -- 1.00 packs/day for 20 years    Types: Cigarettes    Quit date: 04/24/1995  . Smokeless tobacco: Never Used  . Alcohol Use: No  . Drug Use: No  . Sexual Activity: Not on file  Other Topics Concern  . Not on file   Social History Narrative  . No narrative on file     Review of Systems  HENT: Positive for ear pain.   Eyes: Negative for photophobia and visual disturbance.  Respiratory: Positive for cough and chest tightness. Negative for shortness of breath.   Cardiovascular: Positive for chest pain and leg swelling. Negative for palpitations.  Musculoskeletal: Positive for arthralgias, neck pain and neck stiffness.  Neurological: Negative for dizziness, syncope, light-headedness and headaches.  All other systems reviewed and are negative.  Objective:  Physical Exam  Vitals reviewed. Constitutional: She is oriented to person, place, and time. She appears well-developed and well-nourished.  HENT:  Head: Normocephalic and atraumatic.  Right Ear: There is cerumen in canal but not completely obstructed. Visualized TM appears normal.  Eyes: Conjunctivae and EOM are normal. Pupils are equal, round, and reactive to light.  Neck: Carotid bruit is not present.  C-spine ROM is limited in extension, right rotation and slightly decreased right lateral flexion. Tendernessover her right paraspinals and the origin of the SCM at the mastoid and down the length of  the SCM muscle at the right. [place] and AC joints non-tender and shoulder non-tender. FROM of right shoulder and full-rotator cuff strength.   Cardiovascular: Normal rate, regular rhythm, normal heart sounds and intact distal pulses.   1+ pulses in her upper extremities bilaterally.  Pulmonary/Chest: Effort normal and breath sounds normal.  Abdominal: Soft. She exhibits no pulsatile midline mass. There is no tenderness.  Musculoskeletal:  No calf pain. No hx of blood clots. No travel recently, no prolonged car rides. 2-3+ edema of lower extremities bilaterally.  Neurological: She is alert and oriented to person, place, and time.  Equal grip strength bilaterally.   Skin: Skin is warm and dry.  No rash.  Psychiatric: She has a normal mood and affect. Her behavior is normal.     Filed Vitals:   02/17/14 1443  BP: 160/94  Pulse: 67  Temp: 98.2 F (36.8 C)  TempSrc: Oral  Resp: 18  Height: 5\' 6"  (1.676 m)  Weight: 305 lb (138.347 kg)  SpO2: 94%   EKG :SR, no acute findings, but no prior EKG available for comparison.   [facility] reading (PRIMARY) by  Dr. Carlota Raspberry: CXR: cardiomegaly, incomplete inspiration vs increased vascular markings.   XR report: FINDINGS:  Thoracic spondylosis. Atherosclerotic calcification in the aortic  arch. Mild enlargement of the cardiopericardial silhouette ,  cardiothoracic index 51% on the PA view.  Lungs appear clear.  IMPRESSION:  1. Mildly enlarged cardiopericardial silhouette, without edema.  2. Thoracic spondylosis.  Results for orders placed in visit on 02/17/14  GLUCOSE, POCT (MANUAL RESULT ENTRY)      Result Value Ref Range   POC Glucose 132 (*) 70 - 99 mg/dl    Assessment & Plan:   ERICAH SCOTTO is a 68 y.o. female Chest pain, unspecified chest pain type - Plan: EKG 27-OJJK, Basic metabolic panel, DG Chest 2 View, Ambulatory referral to Cardiology, isosorbide mononitrate (IMDUR) 30 MG 24 hr tablet, nitroGLYCERIN (NITROSTAT) 0.4 MG SL  tablet  -asymptomatic in office, but episodic tightness and underlying RF's of HTN, DM2, hyperlipidemia concerning for anginal cause. Discussed with cardiologist on call. Already on ASA, betablocker.  Long acting nitrate (Imdur 30mg  QD) started and NTG 0.4mg  SL prn and use discussed. ER/911 precautions given, but to be evaluated in next 2 days by cardiology.   Neck pain on right side, Muscle spasm  -  sx care with heat, tylenol, stretches for now. Consider flexeril or low dose nsaid (depending on cardiac eval) if not improving.   Essential hypertension - Plan: Ambulatory referral to Cardiology as above. Level ok in office. Orthostatic precautions and side effects of new nitrate discussed.   DM (diabetes mellitus), type 2 with neurological complications - Plan: POCT glucose (manual entry), Basic metabolic panel, Ambulatory referral to Cardiology. Continue follow up with PCP for control assessment/mgt.    Meds ordered this encounter  Medications  . isosorbide mononitrate (IMDUR) 30 MG 24 hr tablet    Sig: Take 1 tablet (30 mg total) by mouth daily.    Dispense:  30 tablet    Refill:  0  . nitroGLYCERIN (NITROSTAT) 0.4 MG SL tablet    Sig: Place 1 tablet (0.4 mg total) under the tongue every 5 (five) minutes as needed for chest pain. If 3rd tablet needed - go to emergency room or call 911.    Dispense:  50 tablet    Refill:  0   Patient Instructions  We will start the new medicine - Imdur once per day - watch for lightheadedness with this medicine. Nitroglycerin tablet if needed for chest pain, but if you have to use more than 2 of these -go to the emergency room or call 911.  If any change or worsening of your chest pain - call 911 or go to the emergency room. We will call you about the cardiology appointment.   For your neck - tylenol, heat over area and gentle stretches - see the manual as well. If this is not improving in next week - return for recehck.  If your ear is worsening (avoid  qtips) - return for recheck.  Return to the clinic or go to the nearest emergency room if any of your symptoms worsen or new symptoms occur.  Chest Pain (Nonspecific) It is often hard to give a specific diagnosis for the cause of chest pain. There is always a chance that your pain could be related to something serious, such as a heart attack or a blood clot in the lungs. You need to follow up with your health care provider for further evaluation. CAUSES   Heartburn.  Pneumonia or bronchitis.  Anxiety or stress.  Inflammation around your heart (pericarditis) or lung (pleuritis or pleurisy).  A blood clot in the lung.  A collapsed lung (pneumothorax). It can develop suddenly on its own (spontaneous pneumothorax) or from trauma to the chest.  Shingles infection (herpes zoster virus). The chest wall is composed of bones, muscles, and cartilage. Any of these can be the source of the pain.  The bones can be bruised by injury.  The muscles or cartilage can be strained by coughing or overwork.  The cartilage can be affected by inflammation and become sore (costochondritis). DIAGNOSIS  Lab tests or other studies may be needed to find the cause of your pain. Your health care provider may have you take a test called an ambulatory electrocardiogram (ECG). An ECG records your heartbeat patterns over a 24-hour period. You may also have other tests, such as:  Transthoracic echocardiogram (TTE). During echocardiography, sound waves are used to evaluate how blood flows through your heart.  Transesophageal echocardiogram (TEE).  Cardiac monitoring. This allows your health care provider to monitor your heart rate and rhythm in real time.  Holter monitor. This is a portable device that records your heartbeat and can help diagnose heart arrhythmias. It allows your health care provider  to track your heart activity for several days, if needed.  Stress tests by exercise or by giving medicine that  makes the heart beat faster. TREATMENT   Treatment depends on what may be causing your chest pain. Treatment may include:  Acid blockers for heartburn.  Anti-inflammatory medicine.  Pain medicine for inflammatory conditions.  Antibiotics if an infection is present.  You may be advised to change lifestyle habits. This includes stopping smoking and avoiding alcohol, caffeine, and chocolate.  You may be advised to keep your head raised (elevated) when sleeping. This reduces the chance of acid going backward from your stomach into your esophagus. Most of the time, nonspecific chest pain will improve within 2-3 days with rest and mild pain medicine.  HOME CARE INSTRUCTIONS   If antibiotics were prescribed, take them as directed. Finish them even if you start to feel better.  For the next few days, avoid physical activities that bring on chest pain. Continue physical activities as directed.  Do not use any tobacco products, including cigarettes, chewing tobacco, or electronic cigarettes.  Avoid drinking alcohol.  Only take medicine as directed by your health care provider.  Follow your health care provider's suggestions for further testing if your chest pain does not go away.  Keep any follow-up appointments you made. If you do not go to an appointment, you could develop lasting (chronic) problems with pain. If there is any problem keeping an appointment, call to reschedule. SEEK MEDICAL CARE IF:   Your chest pain does not go away, even after treatment.  You have a rash with blisters on your chest.  You have a fever. SEEK IMMEDIATE MEDICAL CARE IF:   You have increased chest pain or pain that spreads to your arm, neck, jaw, back, or abdomen.  You have shortness of breath.  You have an increasing cough, or you cough up blood.  You have severe back or abdominal pain.  You feel nauseous or vomit.  You have severe weakness.  You faint.  You have chills. This is an  emergency. Do not wait to see if the pain will go away. Get medical help at once. Call your local emergency services (911 in [place] Do not drive yourself to the hospital. MAKE SURE YOU:   Understand these instructions.  Will watch your condition.  Will get help right away if you are not doing well or get worse. Document Released: 01/17/2005 Document Revised: 04/14/2013 Document Reviewed: 11/13/2007 [facility] Patient Information 2015 [place], [place]. This information is not intended to replace advice given to you by your health care provider. Make sure you discuss any questions you have with your health care provider.   I personally performed the services described in this documentation, which was scribed in my presence. The recorded information has been reviewed and considered, and addended by me as needed.

## 2014-02-17 NOTE — Patient Instructions (Addendum)
We will start the new medicine - Imdur once per day - watch for lightheadedness with this medicine. Nitroglycerin tablet if needed for chest pain, but if you have to use more than 2 of these -go to the emergency room or call 911.  If any change or worsening of your chest pain - call 911 or go to the emergency room. We will call you about the cardiology appointment.   For your neck - tylenol, heat over area and gentle stretches - see the manual as well. If this is not improving in next week - return for recehck.  If your ear is worsening (avoid qtips) - return for recheck.  Return to the clinic or go to the nearest emergency room if any of your symptoms worsen or new symptoms occur.  Chest Pain (Nonspecific) It is often hard to give a specific diagnosis for the cause of chest pain. There is always a chance that your pain could be related to something serious, such as a heart attack or a blood clot in the lungs. You need to follow up with your health care provider for further evaluation. CAUSES   Heartburn.  Pneumonia or bronchitis.  Anxiety or stress.  Inflammation around your heart (pericarditis) or lung (pleuritis or pleurisy).  A blood clot in the lung.  A collapsed lung (pneumothorax). It can develop suddenly on its own (spontaneous pneumothorax) or from trauma to the chest.  Shingles infection (herpes zoster virus). The chest wall is composed of bones, muscles, and cartilage. Any of these can be the source of the pain.  The bones can be bruised by injury.  The muscles or cartilage can be strained by coughing or overwork.  The cartilage can be affected by inflammation and become sore (costochondritis). DIAGNOSIS  Lab tests or other studies may be needed to find the cause of your pain. Your health care provider may have you take a test called an ambulatory electrocardiogram (ECG). An ECG records your heartbeat patterns over a 24-hour period. You may also have other tests, such  as:  Transthoracic echocardiogram (TTE). During echocardiography, sound waves are used to evaluate how blood flows through your heart.  Transesophageal echocardiogram (TEE).  Cardiac monitoring. This allows your health care provider to monitor your heart rate and rhythm in real time.  Holter monitor. This is a portable device that records your heartbeat and can help diagnose heart arrhythmias. It allows your health care provider to track your heart activity for several days, if needed.  Stress tests by exercise or by giving medicine that makes the heart beat faster. TREATMENT   Treatment depends on what may be causing your chest pain. Treatment may include:  Acid blockers for heartburn.  Anti-inflammatory medicine.  Pain medicine for inflammatory conditions.  Antibiotics if an infection is present.  You may be advised to change lifestyle habits. This includes stopping smoking and avoiding alcohol, caffeine, and chocolate.  You may be advised to keep your head raised (elevated) when sleeping. This reduces the chance of acid going backward from your stomach into your esophagus. Most of the time, nonspecific chest pain will improve within 2-3 days with rest and mild pain medicine.  HOME CARE INSTRUCTIONS   If antibiotics were prescribed, take them as directed. Finish them even if you start to feel better.  For the next few days, avoid physical activities that bring on chest pain. Continue physical activities as directed.  Do not use any tobacco products, including cigarettes, chewing tobacco, or electronic cigarettes.  Avoid drinking alcohol.  Only take medicine as directed by your health care provider.  Follow your health care provider's suggestions for further testing if your chest pain does not go away.  Keep any follow-up appointments you made. If you do not go to an appointment, you could develop lasting (chronic) problems with pain. If there is any problem keeping an  appointment, call to reschedule. SEEK MEDICAL CARE IF:   Your chest pain does not go away, even after treatment.  You have a rash with blisters on your chest.  You have a fever. SEEK IMMEDIATE MEDICAL CARE IF:   You have increased chest pain or pain that spreads to your arm, neck, jaw, back, or abdomen.  You have shortness of breath.  You have an increasing cough, or you cough up blood.  You have severe back or abdominal pain.  You feel nauseous or vomit.  You have severe weakness.  You faint.  You have chills. This is an emergency. Do not wait to see if the pain will go away. Get medical help at once. Call your local emergency services (911 in [place] Do not drive yourself to the hospital. MAKE SURE YOU:   Understand these instructions.  Will watch your condition.  Will get help right away if you are not doing well or get worse. Document Released: 01/17/2005 Document Revised: 04/14/2013 Document Reviewed: 11/13/2007 [facility] Patient Information 2015 [place], [place]. This information is not intended to replace advice given to you by your health care provider. Make sure you discuss any questions you have with your health care provider.

## 2014-02-18 LAB — BASIC METABOLIC PANEL
BUN: 19 mg/dL (ref 6–23)
CALCIUM: 9.3 mg/dL (ref 8.4–10.5)
CO2: 23 mEq/L (ref 19–32)
Chloride: 102 mEq/L (ref 96–112)
Creat: 1.02 mg/dL (ref 0.50–1.10)
GLUCOSE: 128 mg/dL — AB (ref 70–99)
Potassium: 4.8 mEq/L (ref 3.5–5.3)
Sodium: 138 mEq/L (ref 135–145)

## 2014-02-23 ENCOUNTER — Encounter: Payer: Self-pay | Admitting: Cardiovascular Disease

## 2014-02-23 ENCOUNTER — Ambulatory Visit (INDEPENDENT_AMBULATORY_CARE_PROVIDER_SITE_OTHER): Payer: 59 | Admitting: Cardiovascular Disease

## 2014-02-23 VITALS — BP 122/86 | HR 59 | Ht 66.0 in | Wt 301.2 lb

## 2014-02-23 DIAGNOSIS — R0602 Shortness of breath: Secondary | ICD-10-CM

## 2014-02-23 DIAGNOSIS — R0789 Other chest pain: Secondary | ICD-10-CM

## 2014-02-23 DIAGNOSIS — I10 Essential (primary) hypertension: Secondary | ICD-10-CM

## 2014-02-23 DIAGNOSIS — E785 Hyperlipidemia, unspecified: Secondary | ICD-10-CM

## 2014-02-23 HISTORY — DX: Other chest pain: R07.89

## 2014-02-23 NOTE — Assessment & Plan Note (Signed)
BP is stable.

## 2014-02-23 NOTE — Assessment & Plan Note (Signed)
She is on Crestor for her hyperlipidemia This has been followed by her primary md. Continue for now

## 2014-02-23 NOTE — Progress Notes (Signed)
Rebecca Hicks Date of Birth  1946-01-09       [place] [address], [place], [place]  [address]   [place], [place]  [address] [place]   Fax  [phone]     Fax [phone]  Problem List: 1. Hypertension 2 chest pain 3. Diabetes mellitus 4. Hyperlipidemia 5. Morbid obesity 6. Pulmonary sarcoidosis 7. Back pain  History of Present Illness:  Rebecca Hicks is referred by an urgent care She presented with some neck pain. She has been having some CP and right  arm pain  The discomfort is not associated with any specific activity.  She has not been walking due to back pain for the past 5 months. She gets Fatigue with walking but does not have specific chest or shoulder pain. No associated with eating or drinking, not associated with taking a deep breath.  May possibly be related to increased anxiety.   The pain is in the center of the chest .  Is a tightness,  Last 15-20 minutes.  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 amLODipine (NORVASC) 10 MG tablet Take 1 tablet (10 mg total) by mouth daily. 90 tablet 3  . aspirin 81 MG tablet Take 81 mg by mouth daily.      . BD ULTRA-FINE PEN NEEDLES 29G X 12.7MM MISC Use two times daily 180 each 3  . diphenoxylate-atropine (LOMOTIL) 2.5-0.025 MG per tablet Take 1 tablet by mouth 4 (four) times daily as needed for diarrhea or loose stools. 120 tablet 6  . furosemide (LASIX) 20 MG tablet Take 1 tablet (20 mg total) by mouth daily. 90 tablet 3  . gabapentin (NEURONTIN) 300 MG capsule Take 1 capsule (300 mg total) by mouth 3 (three) times daily. 270 capsule 3  . isosorbide mononitrate (IMDUR) 30 MG 24 hr tablet Take 1 tablet (30 mg total) by  mouth daily. 30 tablet 0  . metFORMIN (GLUCOPHAGE) 1000 MG tablet Take 1 tablet twice a day 180 tablet 3  . metoprolol (TOPROL-XL) 200 MG 24 hr tablet Take 1 tablet daily 90 tablet 3  . nitroGLYCERIN (NITROSTAT) 0.4 MG SL tablet Place 1 tablet (0.4 mg total) under the tongue every 5 (five) minutes as needed for chest pain. If 3rd tablet needed - go to emergency room or call 911. 50 tablet 0  . ONE TOUCH ULTRA TEST test strip Test 3 times daily 300 each 3  . rosuvastatin (CRESTOR) 40 MG tablet Take 1 tablet (40 mg total) by mouth daily. 90 tablet 3  . telmisartan-hydrochlorothiazide (MICARDIS HCT) 80-12.5 MG per tablet Take 1 tablet by mouth  daily 90 tablet 3  . timolol (TIMOPTIC) 0.25 % ophthalmic solution 2 (two) times daily. Per optho     . zoster vaccine live, PF, (ZOSTAVAX) 22979 UNT/0.65ML injection Inject 19,400 Units into the skin once. 1 each 0   No current facility-administered medications on file prior to visit.    Allergies  Allergen Reactions  . Penicillins     Past Medical History  Diagnosis Date  . Sarcoid   . Hypertension   . Diabetes mellitus   . Hyperlipidemia   .  Morbid obesity   . Polyneuropathy in diabetes(357.2)   . Arthritis   . Cataract   . Glaucoma   . Neuromuscular disorder     Past Surgical History  Procedure Laterality Date  . Abdominal hysterectomy    . Spine surgery      lumbar laminectomy X 2  . Appendectomy    . Eye surgery    . Cataract extraction, bilateral  03/2010, 04/2010  . Tonsillectomy      History  Smoking status  . Former Smoker -- 1.00 packs/day for 20 years  . Types: Cigarettes  . Quit date: 04/24/1995  Smokeless tobacco  . Never Used    History  Alcohol Use No    Family History  Problem Relation Age of Onset  . Hypertension      siblings  . Diabetes      siblings  . Heart disease Brother   . Cervical cancer Mother   . Stroke Father   . Alcohol abuse Father   . Breast cancer Maternal Aunt   . Stroke  Paternal Grandmother   . Alcohol abuse Brother   . Pancreatic cancer Maternal Uncle     Reviw of Systems:  Reviewed in the HPI.  All other systems are negative.  Physical Exam: Blood pressure 122/86, pulse 59, height 5\' 6"  (1.676 m), weight 301 lb 4 oz (136.646 kg). Wt Readings from Last 3 Encounters:  02/23/14 301 lb 4 oz (136.646 kg)  02/17/14 305 lb (138.347 kg)  10/30/13 305 lb 8 oz (138.574 kg)     General: Well developed, well nourished, in no acute distress.  Morbid obesity  Head: Normocephalic, atraumatic, sclera non-icteric, mucus membranes are moist,   Neck: Supple. Carotids are 2 + without bruits. No JVD   Lungs: Clear   Heart: RR, normal S1S2  Abdomen: Soft, non-tender, non-distended with normal bowel sounds.\  Msk:  Strength and tone are normal \  Extremities: No clubbing or cyanosis. No edema.  Distal pedal pulses are 2+ and equal    Neuro: CN II - XII intact.  Alert and oriented X 3.   Psych:  Normal   ECG: 02/23/2014: Sinus bradycardia 59. There is minimal voltage criteria for left ventricular hypertrophy.  Assessment / Plan:

## 2014-02-23 NOTE — Assessment & Plan Note (Signed)
The patient has a history of chronic diastolic congestive heart failure. She had an echocardiogram in 2001 which showed normal left ventricular systolic function.  We discussed the importance of blood pressure control, diet, and weight loss.

## 2014-02-23 NOTE — Assessment & Plan Note (Addendum)
Rebecca Hicks presents with some episodes of chest tightness. These episodes last approximately 20 minutes. They're brought on possibly by exertion and are relieved with rest. She does not get any regular execise.  . She has numerous risk factors for coronary artery disease including diabetes, hypertension, hyperlipidemia, and obesity.  Her EKG reveals LVH with repol.  It is reasonable to perform a 2 day Lexiscan myoview study. We will get this study in our  [place] office.   We have discussed the followup test that would be needed if her stress test is positive. We specifically discussed cardiac catheterization with possible PCI. We discussed the risks, benefits, and options of cardiac cath in PCI. She understands and agrees to proceed if that is needed.  I will see her in the [place] office for follow up.

## 2014-02-23 NOTE — Patient Instructions (Addendum)
[place]  Your caregiver has ordered a Stress Test with nuclear imaging. The purpose of this test is to evaluate the blood supply to your heart muscle. This procedure is referred to as a "Non-Invasive Stress Test." This is because other than having an IV started in your vein, nothing is inserted or "invades" your body. Cardiac stress tests are done to find areas of poor blood flow to the heart by determining the extent of coronary artery disease (CAD). Some patients exercise on a treadmill, which naturally increases the blood flow to your heart, while others who are  unable to walk on a treadmill due to physical limitations have a pharmacologic/chemical stress agent called Lexiscan . This medicine will mimic walking on a treadmill by temporarily increasing your coronary blood flow.   Date of Procedure:_____Friday, November 6______________  Arrival Time for Procedure:_____8:30 am__________________  Instructions regarding medication:   _X_ : Hold diabetes medication morning of procedure  _X_:  Hold betablocker(s) night before procedure and morning of procedure: METOPROLOL  How to prepare for your Myoview test:  1. Do not eat or drink after midnight 2. No caffeine for 24 hours prior to test 3. No smoking 24 hours prior to test. 4. Your medication may be taken with water.  If your doctor stopped a medication because of this test, do not take that medication. 5. Ladies, please do not wear dresses.  Skirts or pants are appropriate. Please wear a short sleeve shirt. 6. No perfume, cologne or lotion. 7. Wear comfortable walking shoes. No heels!   Please follow up in 3 months in the [place] office

## 2014-02-26 ENCOUNTER — Ambulatory Visit (HOSPITAL_COMMUNITY): Payer: 59 | Attending: Internal Medicine | Admitting: Radiology

## 2014-02-26 ENCOUNTER — Encounter (HOSPITAL_COMMUNITY): Payer: 59

## 2014-02-26 DIAGNOSIS — R9431 Abnormal electrocardiogram [ECG] [EKG]: Secondary | ICD-10-CM | POA: Insufficient documentation

## 2014-02-26 DIAGNOSIS — Z794 Long term (current) use of insulin: Secondary | ICD-10-CM | POA: Insufficient documentation

## 2014-02-26 DIAGNOSIS — R0789 Other chest pain: Secondary | ICD-10-CM

## 2014-02-26 DIAGNOSIS — R079 Chest pain, unspecified: Secondary | ICD-10-CM | POA: Diagnosis not present

## 2014-02-26 DIAGNOSIS — R0602 Shortness of breath: Secondary | ICD-10-CM

## 2014-02-26 DIAGNOSIS — E119 Type 2 diabetes mellitus without complications: Secondary | ICD-10-CM | POA: Insufficient documentation

## 2014-02-26 DIAGNOSIS — I10 Essential (primary) hypertension: Secondary | ICD-10-CM | POA: Diagnosis not present

## 2014-02-26 DIAGNOSIS — R0609 Other forms of dyspnea: Secondary | ICD-10-CM | POA: Insufficient documentation

## 2014-02-26 MED ORDER — TECHNETIUM TC 99M SESTAMIBI GENERIC - CARDIOLITE
33.0000 | Freq: Once | INTRAVENOUS | Status: AC | PRN
Start: 2014-02-26 — End: 2014-02-26
  Administered 2014-02-26: 33 via INTRAVENOUS

## 2014-02-26 MED ORDER — REGADENOSON 0.4 MG/5ML IV SOLN
0.4000 mg | Freq: Once | INTRAVENOUS | Status: AC
  Administered 2014-02-26: 0.4 mg via INTRAVENOUS

## 2014-02-26 NOTE — Progress Notes (Signed)
[facility]UCLE[facility] [place]  [address] [phone]    Cardiology Nuclear Med Study  Rebecca Hicks is a 68 y.o. female     MRN : [id number]     DOB: November 15, 1945  Procedure Date: 02/26/2014  Nuclear Med Background Indication for Stress Test:  Evaluation for Ischemia and Abnormal EKG History:  No known CAD, hx CHF, pulmonary sarcoidosis Cardiac Risk Factors: Hypertension and IDDM   Symptoms:  Chest Pain (last date of chest discomfort was two weeks ago) and DOE   Nuclear Pre-Procedure Caffeine/Decaff Intake:  None> 12 hrs NPO After: 7:30am   Lungs:  clear O2 Sat: 96% on room air. IV 0.9% NS with Angio Cath:  22g  IV Site: R Antecubital x 1, tolerated well IV Started by:  Irven Baltimore, RN  Chest Size (in):  50 Cup Size: B  Height: 5\' 6"  (1.676 m)  Weight:  293 lb (132.904 kg)  BMI:  Body mass index is 47.31 kg/(m^2). Tech Comments:  Fasting CBG was 166 at 06:30am today. < 1/2 dose of Humulin Insulin 70/30 with breakfast this am. No Toprol or Metformin this am. Irven Baltimore, RN.    Nuclear Med Study 1 or 2 day study: 2 day  Stress Test Type:  Carlton Adam  Reading MD: N/A  Order Authorizing Provider:  Mertie Moores, MD  Resting Radionuclide: Technetium 40m Sestamibi  Resting Radionuclide Dose: 33.0 mCi on 03/02/2014  Stress Radionuclide:  Technetium 65m Sestamibi  Stress Radionuclide Dose: 33.0 mCi on 02/26/2014          Stress Protocol Rest HR: 59 Stress HR: 78  Rest BP: 147/73 Stress BP: 133/65  Exercise Time (min): n/a METS: n/a   Predicted Max HR: 153 bpm % Max HR: 50.98 bpm Rate Pressure Product: 11466   Dose of Adenosine (mg):  n/a Dose of Lexiscan: 0.4 mg  Dose of Atropine (mg): n/a Dose of Dobutamine: n/a mcg/kg/min (at max HR)  Stress Test Technologist: Glade Lloyd, BS-ES  Nuclear Technologist:  Earl Many, CNMT     Rest Procedure:  Myocardial perfusion imaging was performed at rest 45 minutes following the  intravenous administration of Technetium 45m Sestamibi. Rest ECG: normal sinus rhythm. Normal EKG.  Stress Procedure:  The patient received IV Lexiscan 0.4 mg over 15-seconds.  Technetium 60m Sestamibi injected at 30-seconds.  Quantitative spect images were obtained after a 45 minute delay.  During the infusion of Lexiscan the patient complained of feeling weird.  This resolved in recovery.  Stress ECG: No significant change from baseline ECG  QPS Raw Data Images:  Normal; no motion artifact; normal heart/lung ratio. Stress Images:  Visually there is mild decreased activity near the anterior apex. This may be due to breast attenuation. The quantitative analysis shows no abnormality. Rest Images:  Rest images are the same as stress images. Subtraction (SDS):  No evidence of ischemia. Transient Ischemic Dilatation (Normal <1.22):  1.18 Lung/Heart Ratio (Normal <0.45):  0.24  Quantitative Gated Spect Images QGS EDV:  121 ml QGS ESV:  49 ml  Impression Exercise Capacity:  Lexiscan with no exercise. BP Response:  Normal blood pressure response. Clinical Symptoms:  patient felt weird from the injection ECG Impression:  No significant ST segment change suggestive of ischemia. Comparison with Prior Nuclear Study: No images to compare  Overall Impression:  This is a low risk scan. There is no significant abnormality. There is decreased activity near the anterior apex that most probably represents breast  attenuation. There is no sign of ischemia.  LV Ejection Fraction: 60%.  LV Wall Motion:  Normal Wall Motion.  Dola Argyle, MD

## 2014-03-02 ENCOUNTER — Ambulatory Visit (HOSPITAL_COMMUNITY): Payer: 59 | Attending: Cardiology

## 2014-03-02 DIAGNOSIS — R0989 Other specified symptoms and signs involving the circulatory and respiratory systems: Secondary | ICD-10-CM

## 2014-03-02 MED ORDER — TECHNETIUM TC 99M SESTAMIBI GENERIC - CARDIOLITE
30.0000 | Freq: Once | INTRAVENOUS | Status: AC | PRN
  Administered 2014-03-02: 30 via INTRAVENOUS

## 2014-03-08 ENCOUNTER — Telehealth: Payer: Self-pay

## 2014-03-08 NOTE — Telephone Encounter (Signed)
Informed patient per Dr. Acie Fredrickson that her stress test was low risk   She wanted to know if Dr. Acie Fredrickson wants her to continue Imdur and PRN Nitro ordered by Urgent care MD

## 2014-03-08 NOTE — Telephone Encounter (Signed)
Pt left vm returning your call regarding stress test.

## 2014-03-09 NOTE — Telephone Encounter (Signed)
Spoke with patient to give her Dr. Elmarie Shiley advice.  Patient states she does not notice a significant difference with the Imdur, so she will probably discontinue.  I advised patient of her of need to keep f/u appointment in [place] on 05/28/14 or to reschedule if that date does not work for her.  Patient verbalized understanding and agreement.

## 2014-03-09 NOTE — Telephone Encounter (Signed)
She should have a follow up visit in [place] in the next several months. If she thinks the Imdur is helping, she may continue it. If she does not think it is helping, she may stop

## 2014-04-07 ENCOUNTER — Ambulatory Visit (INDEPENDENT_AMBULATORY_CARE_PROVIDER_SITE_OTHER): Payer: 59 | Admitting: Family Medicine

## 2014-04-07 ENCOUNTER — Ambulatory Visit (INDEPENDENT_AMBULATORY_CARE_PROVIDER_SITE_OTHER): Payer: 59 | Admitting: *Deleted

## 2014-04-07 ENCOUNTER — Encounter: Payer: Self-pay | Admitting: Family Medicine

## 2014-04-07 VITALS — BP 168/82 | HR 60 | Temp 98.1°F | Ht 66.0 in | Wt 295.1 lb

## 2014-04-07 DIAGNOSIS — E1165 Type 2 diabetes mellitus with hyperglycemia: Secondary | ICD-10-CM

## 2014-04-07 DIAGNOSIS — G4710 Hypersomnia, unspecified: Secondary | ICD-10-CM | POA: Insufficient documentation

## 2014-04-07 DIAGNOSIS — I10 Essential (primary) hypertension: Secondary | ICD-10-CM

## 2014-04-07 DIAGNOSIS — M5441 Lumbago with sciatica, right side: Secondary | ICD-10-CM

## 2014-04-07 DIAGNOSIS — N182 Chronic kidney disease, stage 2 (mild): Secondary | ICD-10-CM

## 2014-04-07 DIAGNOSIS — Z23 Encounter for immunization: Secondary | ICD-10-CM

## 2014-04-07 DIAGNOSIS — E1140 Type 2 diabetes mellitus with diabetic neuropathy, unspecified: Secondary | ICD-10-CM

## 2014-04-07 DIAGNOSIS — IMO0002 Reserved for concepts with insufficient information to code with codable children: Secondary | ICD-10-CM

## 2014-04-07 HISTORY — DX: Hypersomnia, unspecified: G47.10

## 2014-04-07 LAB — BASIC METABOLIC PANEL
BUN: 23 mg/dL (ref 6–23)
CALCIUM: 9.5 mg/dL (ref 8.4–10.5)
CO2: 24 mEq/L (ref 19–32)
Chloride: 101 mEq/L (ref 96–112)
Creat: 1.16 mg/dL — ABNORMAL HIGH (ref 0.50–1.10)
Glucose, Bld: 135 mg/dL — ABNORMAL HIGH (ref 70–99)
Potassium: 4.5 mEq/L (ref 3.5–5.3)
SODIUM: 136 meq/L (ref 135–145)

## 2014-04-07 LAB — POCT GLYCOSYLATED HEMOGLOBIN (HGB A1C): HEMOGLOBIN A1C: 9.1

## 2014-04-07 MED ORDER — INSULIN ISOPHANE & REGULAR (HUMAN 70-30)100 UNIT/ML KWIKPEN: PEN_INJECTOR | SUBCUTANEOUS | Status: DC

## 2014-04-07 MED ORDER — CLONIDINE HCL 0.1 MG/24HR TD PTWK: 0.1000 mg | MEDICATED_PATCH | TRANSDERMAL | Status: DC

## 2014-04-07 MED ORDER — TRAMADOL HCL 50 MG PO TABS: 50.0000 mg | ORAL_TABLET | Freq: Three times a day (TID) | ORAL | Status: DC | PRN

## 2014-04-07 NOTE — Assessment & Plan Note (Addendum)
Stop taking advil. Add tramadol Per neuro and EMG/nerve conduction, neuropathy is more from the back than from DM

## 2014-04-07 NOTE — Patient Instructions (Addendum)
I will order a sleep study on you.  You are at risk Increase your insulin to 70 units each morning and 60 units before your evening meal. I added another blood pressure medication: clonidine patch which you change once a week. Flu shot and prevnar today. I will call with kidney test results. New pain medicine - tramadol.  Stop advil/aleve. See me in one month for BP recheck.

## 2014-04-08 ENCOUNTER — Encounter: Payer: Self-pay | Admitting: Family Medicine

## 2014-04-08 NOTE — Assessment & Plan Note (Signed)
Tramadol should help.  No nsaids.

## 2014-04-08 NOTE — Assessment & Plan Note (Signed)
Evaluate for sleep apnea

## 2014-04-08 NOTE — Assessment & Plan Note (Signed)
Increase insulin 

## 2014-04-08 NOTE — Progress Notes (Signed)
   Subjective:    Patient ID: Rebecca Hicks, female    DOB: [birth year], 67 y.o.   MRN: [id number]  HPI  Rebecca Hicks has several issues: Hypertension: poorly controled.  Already on ARB, beta blocker, two diuretics (lasix and HCTZ).  States she never misses her BP meds (HBP scares her more than DM) DM poor control.  A1C has ticked up a bit Sleepy all the time.  Lives alone so does not know about snoring.  Never evaluated for sleep apnea. Worsening bilateral leg pain and numbness.  Feels like she is unsteady on her feet.  Mobility getting more difficult.  Taking aleve and advil some due to pain - even though I have said no because of CKD.   Review of Systems     Objective:   Physical ExamVS and wt noted.  High BP is confirmed. Lungs clear Cardiac RRR without m or g Ext 2= bilateral edema. See diabetic foot exam.        Assessment & Plan:

## 2014-04-08 NOTE — Assessment & Plan Note (Signed)
Stop NSAIDs.  Add tramadol.  Recheck labs.

## 2014-04-18 ENCOUNTER — Ambulatory Visit (INDEPENDENT_AMBULATORY_CARE_PROVIDER_SITE_OTHER): Payer: 59 | Admitting: Family Medicine

## 2014-04-18 ENCOUNTER — Ambulatory Visit (INDEPENDENT_AMBULATORY_CARE_PROVIDER_SITE_OTHER): Payer: 59

## 2014-04-18 VITALS — BP 128/72 | HR 95 | Temp 97.2°F | Resp 16 | Ht 66.0 in | Wt 298.0 lb

## 2014-04-18 DIAGNOSIS — J988 Other specified respiratory disorders: Secondary | ICD-10-CM

## 2014-04-18 DIAGNOSIS — R059 Cough, unspecified: Secondary | ICD-10-CM

## 2014-04-18 DIAGNOSIS — R05 Cough: Secondary | ICD-10-CM

## 2014-04-18 DIAGNOSIS — D869 Sarcoidosis, unspecified: Secondary | ICD-10-CM

## 2014-04-18 DIAGNOSIS — J22 Unspecified acute lower respiratory infection: Secondary | ICD-10-CM

## 2014-04-18 MED ORDER — AZITHROMYCIN 250 MG PO TABS: ORAL_TABLET | ORAL | Status: DC

## 2014-04-18 MED ORDER — HYDROCOD POLST-CHLORPHEN POLST 10-8 MG/5ML PO LQCR: 5.0000 mL | Freq: Every evening | ORAL | Status: DC | PRN

## 2014-04-18 NOTE — Patient Instructions (Signed)
Acute Bronchitis °Bronchitis is inflammation of the airways that extend from the windpipe into the lungs (bronchi). The inflammation often causes mucus to develop. This leads to a cough, which is the most common symptom of bronchitis.  °In acute bronchitis, the condition usually develops suddenly and goes away over time, usually in a couple weeks. Smoking, allergies, and asthma can make bronchitis worse. Repeated episodes of bronchitis may cause further lung problems.  °CAUSES °Acute bronchitis is most often caused by the same virus that causes a cold. The virus can spread from person to person (contagious) through coughing, sneezing, and touching contaminated objects. °SIGNS AND SYMPTOMS  °· Cough.   °· Fever.   °· Coughing up mucus.   °· Body aches.   °· Chest congestion.   °· Chills.   °· Shortness of breath.   °· Sore throat.   °DIAGNOSIS  °Acute bronchitis is usually diagnosed through a physical exam. Your health care provider will also ask you questions about your medical history. Tests, such as chest X-rays, are sometimes done to rule out other conditions.  °TREATMENT  °Acute bronchitis usually goes away in a couple weeks. Oftentimes, no medical treatment is necessary. Medicines are sometimes given for relief of fever or cough. Antibiotic medicines are usually not needed but may be prescribed in certain situations. In some cases, an inhaler may be recommended to help reduce shortness of breath and control the cough. A cool mist vaporizer may also be used to help thin bronchial secretions and make it easier to clear the chest.  °HOME CARE INSTRUCTIONS °· Get plenty of rest.   °· Drink enough fluids to keep your urine clear or pale yellow (unless you have a medical condition that requires fluid restriction). Increasing fluids may help thin your respiratory secretions (sputum) and reduce chest congestion, and it will prevent dehydration.   °· Take medicines only as directed by your health care provider. °· If  you were prescribed an antibiotic medicine, finish it all even if you start to feel better. °· Avoid smoking and secondhand smoke. Exposure to cigarette smoke or irritating chemicals will make bronchitis worse. If you are a smoker, consider using nicotine gum or skin patches to help control withdrawal symptoms. Quitting smoking will help your lungs heal faster.   °· Reduce the chances of another bout of acute bronchitis by washing your hands frequently, avoiding people with cold symptoms, and trying not to touch your hands to your mouth, nose, or eyes.   °· Keep all follow-up visits as directed by your health care provider.   °SEEK MEDICAL CARE IF: °Your symptoms do not improve after 1 week of treatment.  °SEEK IMMEDIATE MEDICAL CARE IF: °· You develop an increased fever or chills.   °· You have chest pain.   °· You have severe shortness of breath. °· You have bloody sputum.   °· You develop dehydration. °· You faint or repeatedly feel like you are going to pass out. °· You develop repeated vomiting. °· You develop a severe headache. °MAKE SURE YOU:  °· Understand these instructions. °· Will watch your condition. °· Will get help right away if you are not doing well or get worse. °Document Released: 05/17/2004 Document Revised: 08/24/2013 Document Reviewed: 09/30/2012 °[employer]® Patient Information ©2015 [employer]. This information is not intended to replace advice given to you by your health care provider. Make sure you discuss any questions you have with your health care provider. ° °

## 2014-04-18 NOTE — Progress Notes (Addendum)
Patient ID: Rebecca Hicks, female   DOB: 05/30/1945, 68 y.o.   MRN: [id number]     This chart was scribed for Rikki Spearing, DO by [facility], ED Scribe at [facility]The patient was seen in exam room 01 and the patient's care was started at 2:14 PM.  Chief Complaint:  Chief Complaint  Patient presents with   Cough    x 1 week   HPI: Rebecca Hicks is a 68 y.o. female with a history of sarcoidosis and DM who is here for a cough, onset 1 week ago. For the past 3 days she has had chest soreness and thick dark yellow sputum due to her cough. Pt has sore throat, SOB, ear pain, chills, diaphoresis and loss of her voice as associated symptoms. She has tried coricidin for minor relief.She denies CP, wheezing, fever, nausea, vomiting, facial pain and diarrhea.    She has sarcoidosis and attributes her itching to this.  Pt has a sleep study scheduled in March.  Past Medical History  Diagnosis Date   Sarcoid    Hypertension    Diabetes mellitus    Hyperlipidemia    Morbid obesity    Polyneuropathy in diabetes(357.2)    Arthritis    Cataract    Glaucoma    Neuromuscular disorder    Past Surgical History  Procedure Laterality Date   Abdominal hysterectomy     Spine surgery      lumbar laminectomy X 2   Appendectomy     Eye surgery     Cataract extraction, bilateral  03/2010, 04/2010   Tonsillectomy     History   Social History   Marital Status: Single    Spouse Name: N/A    Number of Children: 0   Years of Education: college   Occupational History   Freight forwarder Unemployed   Social History Main Topics   Smoking status: Former Smoker -- 1.00 packs/day for 20 years    Types: Cigarettes    Quit date: 04/24/1995   Smokeless tobacco: Never Used   Alcohol Use: No   Drug Use: No   Sexual Activity: None   Other Topics Concern   None   Social History Narrative   Family History  Problem Relation Age of Onset   Hypertension        siblings   Diabetes      siblings   Heart disease Brother    Cervical cancer Mother    Stroke Father    Alcohol abuse Father    Breast cancer Maternal Aunt    Stroke Paternal Grandmother    Alcohol abuse Brother    Pancreatic cancer Maternal Uncle    Allergies  Allergen Reactions   Penicillins    Prior to Admission medications   Medication Sig Start Date End Date Taking? Authorizing Provider  amLODipine (NORVASC) 10 MG tablet Take 1 tablet (10 mg total) by mouth daily. 07/20/13  Yes Zigmund Gottron, MD  aspirin 81 MG tablet Take 81 mg by mouth daily.     Yes Historical Provider, MD  BD ULTRA-FINE PEN NEEDLES 29G X 12.7MM MISC Use two times daily 01/18/14  Yes Zigmund Gottron, MD  cloNIDine (CATAPRES - DOSED IN MG/24 HR) 0.1 mg/24hr patch Place 1 patch (0.1 mg total) onto the skin once a week. 04/07/14  Yes Zigmund Gottron, MD  diphenoxylate-atropine (LOMOTIL) 2.5-0.025 MG per tablet Take 1 tablet by mouth 4 (four) times daily as needed for diarrhea  or loose stools. 10/30/13  Yes Zigmund Gottron, MD  furosemide (LASIX) 20 MG tablet Take 1 tablet (20 mg total) by mouth daily. 10/30/13  Yes Zigmund Gottron, MD  gabapentin (NEURONTIN) 300 MG capsule Take 1 capsule (300 mg total) by mouth 3 (three) times daily. 07/20/13  Yes Zigmund Gottron, MD  Insulin Isophane & Regular Human (HUMULIN 70/30 KWIKPEN) (70-30) 100 UNIT/ML PEN Take 70 every am & 60 units before evening meal. 04/07/14  Yes Zigmund Gottron, MD  isosorbide mononitrate (IMDUR) 30 MG 24 hr tablet Take 1 tablet (30 mg total) by mouth daily. 02/17/14  Yes Wendie Agreste, MD  metFORMIN (GLUCOPHAGE) 1000 MG tablet Take 1 tablet twice a day 04/20/13  Yes Zigmund Gottron, MD  metoprolol (TOPROL-XL) 200 MG 24 hr tablet Take 1 tablet daily 01/18/14  Yes Zigmund Gottron, MD  nitroGLYCERIN (NITROSTAT) 0.4 MG SL tablet Place 1 tablet (0.4 mg total) under the tongue every 5 (five)  minutes as needed for chest pain. If 3rd tablet needed - go to emergency room or call 911. 02/17/14  Yes Wendie Agreste, MD  ONE [facility] ULTRA TEST test strip Test 3 times daily 01/21/13  Yes Zigmund Gottron, MD  rosuvastatin (CRESTOR) 40 MG tablet Take 1 tablet (40 mg total) by mouth daily. 12/21/13  Yes Zigmund Gottron, MD  telmisartan-hydrochlorothiazide (MICARDIS HCT) 80-12.5 MG per tablet Take 1 tablet by mouth  daily 01/21/13  Yes Zigmund Gottron, MD  timolol (TIMOPTIC) 0.25 % ophthalmic solution 2 (two) times daily. Per optho    Yes Historical Provider, MD  traMADol (ULTRAM) 50 MG tablet Take 1 tablet (50 mg total) by mouth every 8 (eight) hours as needed. 04/07/14  Yes Zigmund Gottron, MD  zoster vaccine live, PF, (ZOSTAVAX) 68341 UNT/0.65ML injection Inject 19,400 Units into the skin once. 05/20/13  Yes Zigmund Gottron, MD     ROS: The patient denies fevers, unintentional weight loss, chest pain, palpitations, wheezing, dyspnea on exertion, nausea, vomiting, abdominal pain, dysuria, hematuria, melena, numbness, weakness, or tingling.   All other systems have been reviewed and were otherwise negative with the exception of those mentioned in the HPI and as above.    PHYSICAL EXAM: Filed Vitals:   04/18/14 1407  BP: 128/72  Pulse: 95  Temp: 97.2 F (36.2 C)  Resp: 16   SpO2- 95%  Body mass index is 48.12 kg/(m^2).  General: Alert, no acute distress, morbidly obese.  HEENT:  Normocephalic, atraumatic, oropharynx patent. EOMI, PERRLA, minimal maxallary tenderness, no exudate, TM's are normal. Cardiovascular:  Regular rate and rhythm, no rubs murmurs or gallops.  No Carotid bruits, radial pulse intact. No pedal edema.  Respiratory: Clear to auscultation bilaterally.  No wheezes, rales, or rhonchi.  No cyanosis, no use of accessory musculature GI: No organomegaly, abdomen is soft and non-tender, positive bowel sounds.  No masses. Skin: No  rashes. Neurologic: Facial musculature symmetric. Psychiatric: Patient is appropriate throughout our interaction. Lymphatic: No cervical lymphadenopathy Musculoskeletal: Gait intact.  LABS: Results for orders placed or performed in visit on 96/22/29  Basic Metabolic Panel  Result Value Ref Range   Sodium 136 135 - 145 mEq/L   Potassium 4.5 3.5 - 5.3 mEq/L   Chloride 101 96 - 112 mEq/L   CO2 24 19 - 32 mEq/L   Glucose, Bld 135 (H) 70 - 99 mg/dL   BUN 23 6 - 23 mg/dL   Creat 1.16 (H) 0.50 - 1.10 mg/dL  Calcium 9.5 8.4 - 10.5 mg/dL  HgB A1c  Result Value Ref Range   Hemoglobin A1C 9.1    EKG/XRAY:   Primary read interpreted by Dr. Marin Comment at [facility]. Neg for any acute cardiopulmonary process    ASSESSMENT/PLAN: Acute Lower respiratory infection most likely bronchitis- Rx z apck Cough- txt with otc coughmeds, if worse at night may take Rx Tussionex Sarcoidosis appears stable but due to immunocompromise will actively treat Fu prn     Gross sideeffects, risk and benefits, and alternatives of medications d/w patient. Patient is aware that all medications have potential sideeffects and we are unable to predict every sideeffect or drug-drug interaction that may occur.  Rikki Spearing, DO 04/18/2014 2:11 PM

## 2014-05-12 ENCOUNTER — Ambulatory Visit: Payer: 59 | Admitting: Family Medicine

## 2014-05-14 ENCOUNTER — Ambulatory Visit: Payer: Self-pay | Admitting: Family Medicine

## 2014-05-18 ENCOUNTER — Other Ambulatory Visit: Payer: Self-pay | Admitting: *Deleted

## 2014-05-18 DIAGNOSIS — M5441 Lumbago with sciatica, right side: Secondary | ICD-10-CM

## 2014-05-18 MED ORDER — TRAMADOL HCL 50 MG PO TABS: 50.0000 mg | ORAL_TABLET | Freq: Three times a day (TID) | ORAL | Status: DC | PRN

## 2014-05-18 NOTE — Telephone Encounter (Signed)
Rx for Tramadol faxed to [facility].  Derl Barrow, RN

## 2014-05-21 ENCOUNTER — Ambulatory Visit (INDEPENDENT_AMBULATORY_CARE_PROVIDER_SITE_OTHER): Payer: 59 | Admitting: Family Medicine

## 2014-05-21 ENCOUNTER — Encounter: Payer: Self-pay | Admitting: Family Medicine

## 2014-05-21 VITALS — BP 146/86 | HR 94 | Temp 98.1°F | Ht 66.0 in | Wt 298.3 lb

## 2014-05-21 DIAGNOSIS — R300 Dysuria: Secondary | ICD-10-CM

## 2014-05-21 DIAGNOSIS — I10 Essential (primary) hypertension: Secondary | ICD-10-CM

## 2014-05-21 DIAGNOSIS — E1140 Type 2 diabetes mellitus with diabetic neuropathy, unspecified: Secondary | ICD-10-CM

## 2014-05-21 LAB — POCT URINALYSIS DIPSTICK
Blood, UA: NEGATIVE
Glucose, UA: NEGATIVE
LEUKOCYTES UA: NEGATIVE
Nitrite, UA: NEGATIVE
Protein, UA: 100
Spec Grav, UA: 1.025
Urobilinogen, UA: 0.2
pH, UA: 5.5

## 2014-05-21 MED ORDER — METFORMIN HCL 1000 MG PO TABS: 1000.0000 mg | ORAL_TABLET | Freq: Two times a day (BID) | ORAL | Status: DC

## 2014-05-21 MED ORDER — CLONIDINE HCL 0.2 MG/24HR TD PTWK
0.2000 mg | MEDICATED_PATCH | TRANSDERMAL | Status: DC
Start: 2014-05-21 — End: 2015-01-20

## 2014-05-21 MED ORDER — TELMISARTAN-HCTZ 80-12.5 MG PO TABS: ORAL_TABLET | ORAL | Status: DC

## 2014-05-21 MED ORDER — CLONIDINE HCL 0.2 MG/24HR TD PTWK: 0.2000 mg | MEDICATED_PATCH | TRANSDERMAL | Status: DC

## 2014-05-21 MED ORDER — DULOXETINE HCL 30 MG PO CPEP: 30.0000 mg | ORAL_CAPSULE | Freq: Every day | ORAL | Status: DC

## 2014-05-21 NOTE — Assessment & Plan Note (Signed)
Add cymbalta.  Recheck A1C in two months.  Consider switch to lantus and novolog.

## 2014-05-21 NOTE — Progress Notes (Signed)
   Subjective:    Patient ID: Rebecca Hicks, female    DOB: Jan 03, 1946, 69 y.o.   MRN: [id number]  HPI Needs refill for clonidine.  Home BPs are up even though today's BP is OK.  I will increase clonidine dose.  DM states she is eating better and better blood sugars since December changes.   Peripheral neuropathy uncomfortable.  Can't increase gabapentin due to sleepiness. Does not want bone density testing - I agree due to her low risk States will get shingles vaccine.  Review of Systems     Objective:   Physical Exam Lungs clear Cardiac RRR without m or g See DM foot exam.       Assessment & Plan:

## 2014-05-21 NOTE — Patient Instructions (Signed)
I increased the patch blood pressure medication.  Thank you for bringing in those readings.  Please do that again. We can talk about changing your insulin doses in March if the control is not good. I added a new neuropathy medication - duloxetine. Let me know when you get your shingles vaccine so I can update your records. Let me know if you change your mind and want the bone density test. See you in March Remember our discussion about timing of joint replacement surgery.

## 2014-05-21 NOTE — Assessment & Plan Note (Signed)
Poor control by home BP.  Increase clonidine

## 2014-05-28 ENCOUNTER — Ambulatory Visit: Payer: 59 | Admitting: Cardiovascular Disease

## 2014-06-03 ENCOUNTER — Ambulatory Visit (INDEPENDENT_AMBULATORY_CARE_PROVIDER_SITE_OTHER): Payer: 59 | Admitting: Cardiovascular Disease

## 2014-06-03 ENCOUNTER — Encounter: Payer: Self-pay | Admitting: Cardiovascular Disease

## 2014-06-03 VITALS — BP 156/100 | HR 77 | Ht 66.0 in | Wt 296.6 lb

## 2014-06-03 DIAGNOSIS — I10 Essential (primary) hypertension: Secondary | ICD-10-CM

## 2014-06-03 LAB — BASIC METABOLIC PANEL
BUN: 18 mg/dL (ref 6–23)
CALCIUM: 9.4 mg/dL (ref 8.4–10.5)
CO2: 28 mEq/L (ref 19–32)
Chloride: 103 mEq/L (ref 96–112)
Creatinine, Ser: 0.9 mg/dL (ref 0.40–1.20)
GFR: 80.18 mL/min (ref >60.00)
Glucose, Bld: 173 mg/dL — ABNORMAL HIGH (ref 70–99)
Potassium: 4.4 mEq/L (ref 3.5–5.1)
Sodium: 137 mEq/L (ref 135–145)

## 2014-06-03 MED ORDER — SPIRONOLACTONE 25 MG PO TABS: 25.0000 mg | ORAL_TABLET | Freq: Every day | ORAL | Status: DC

## 2014-06-03 NOTE — Progress Notes (Signed)
Cardiology Office Note   Date:  06/03/2014   ID:  Rebecca Hicks, DOB [birth year], MRN [id number]  PCP:  Zigmund Gottron, MD  Cardiologist:   Thayer Headings, MD   Chief Complaint  Patient presents with  . Follow-up    HTN   1. Hypertension 2 chest pain 3. Diabetes mellitus 4. Hyperlipidemia 5. Morbid obesity 6. Pulmonary sarcoidosis 7. Back pain  History of Present Illness: Nov. 3, 2015:  Rebecca Hicks is referred by an urgent care She presented with some neck pain. She has been having some CP and right arm pain  The discomfort is not associated with any specific activity. She has not been walking due to back pain for the past 5 months. She gets Fatigue with walking but does not have specific chest or shoulder pain. No associated with eating or drinking, not associated with taking a deep breath. May possibly be related to increased anxiety.  The pain is in the center of the chest . Is a tightness, Last 15-20 minutes. 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 11, 2016:  Rebecca Hicks is a 69 y.o. female who presents for follow up of her chest tightness  She had a myoview in [place] which was low risk for CAD. She is feeling better. No further episodes of chest pain.  BP is elevated.  Dr. Andria Frames just started clonidine patch    Past Medical History  Diagnosis Date  . Sarcoid   . Hypertension   . Diabetes mellitus   . Hyperlipidemia   . Morbid obesity   . Polyneuropathy in diabetes(357.2)   . Arthritis   . Cataract   . Glaucoma   . Neuromuscular disorder     Past Surgical History  Procedure Laterality Date  . Abdominal hysterectomy    . Spine surgery      lumbar laminectomy X 2  . Appendectomy    . Eye surgery    . Cataract extraction, bilateral  03/2010, 04/2010  . Tonsillectomy       Current Outpatient  Prescriptions  Medication Sig Dispense Refill  . amLODipine (NORVASC) 10 MG tablet Take 1 tablet (10 mg total) by mouth daily. 90 tablet 3  . aspirin 81 MG tablet Take 81 mg by mouth daily.      . BD ULTRA-FINE PEN NEEDLES 29G X 12.7MM MISC Use two times daily 180 each 3  . cloNIDine (CATAPRES - DOSED IN MG/24 HR) 0.2 mg/24hr patch Place 1 patch (0.2 mg total) onto the skin once a week. 4 patch 3  . diphenoxylate-atropine (LOMOTIL) 2.5-0.025 MG per tablet Take 1 tablet by mouth 4 (four) times daily as needed for diarrhea or loose stools. 120 tablet 6  . DULoxetine (CYMBALTA) 30 MG capsule Take 1 capsule (30 mg total) by mouth daily. 30 capsule 3  . furosemide (LASIX) 20 MG tablet Take 1 tablet (20 mg total) by mouth daily. 90 tablet 3  . gabapentin (NEURONTIN) 300 MG capsule Take 1 capsule (300 mg total) by mouth 3 (three) times daily. 270 capsule 3  . Insulin Isophane & Regular Human (HUMULIN 70/30 KWIKPEN) (70-30) 100 UNIT/ML PEN Take 70 every am & 60 units before evening meal. 15 mL   . metFORMIN (GLUCOPHAGE) 1000 MG tablet Take 1 tablet (1,000 mg total) by mouth 2 (two) times daily. 180 tablet  3  . metoprolol (TOPROL-XL) 200 MG 24 hr tablet Take 1 tablet daily 90 tablet 3  . nitroGLYCERIN (NITROSTAT) 0.4 MG SL tablet Place 1 tablet (0.4 mg total) under the tongue every 5 (five) minutes as needed for chest pain. If 3rd tablet needed - go to emergency room or call 911. 50 tablet 0  . ONE TOUCH ULTRA TEST test strip Test 3 times daily 300 each 3  . rosuvastatin (CRESTOR) 40 MG tablet Take 1 tablet (40 mg total) by mouth daily. (Patient taking differently: Take 40 mg by mouth daily. PT STATES SHE IS TAKING 3 TIMES PER WEEK) 90 tablet 3  . telmisartan-hydrochlorothiazide (MICARDIS HCT) 80-12.5 MG per tablet Take 1 tablet by mouth  daily 90 tablet 3  . timolol (TIMOPTIC) 0.25 % ophthalmic solution 2 (two) times daily. Per optho     . traMADol (ULTRAM) 50 MG tablet Take 1 tablet (50 mg total) by mouth  every 8 (eight) hours as needed. 90 tablet 5  . zoster vaccine live, PF, (ZOSTAVAX) 16109 UNT/0.65ML injection Inject 19,400 Units into the skin once. 1 each 0   No current facility-administered medications for this visit.    Allergies:   Penicillins    Social History:  The patient  reports that she quit smoking about 19 years ago. Her smoking use included Cigarettes. She has a 20 pack-year smoking history. She has never used smokeless tobacco. She reports that she does not drink alcohol or use illicit drugs.   Family History:  The patient's family history includes Alcohol abuse in her brother and father; Breast cancer in her maternal aunt; Cervical cancer in her mother; Diabetes in an other family member; Heart disease in her brother; Hypertension in an other family member; Pancreatic cancer in her maternal uncle; Stroke in her father and paternal grandmother.    ROS:  Please see the history of present illness.    Review of Systems: Constitutional:  denies fever, chills, diaphoresis, appetite change and fatigue.  HEENT: denies photophobia, eye pain, redness, hearing loss, ear pain, congestion, sore throat, rhinorrhea, sneezing, neck pain, neck stiffness and tinnitus.  Respiratory: denies SOB, DOE, cough, chest tightness, and wheezing.  Cardiovascular: denies chest pain, palpitations and leg swelling.  Gastrointestinal: denies nausea, vomiting, abdominal pain, diarrhea, constipation, blood in stool.  Genitourinary: denies dysuria, urgency, frequency, hematuria, flank pain and difficulty urinating.  Musculoskeletal: denies  myalgias, back pain, joint swelling, arthralgias and gait problem.   Skin: denies pallor, rash and wound.  Neurological: denies dizziness, seizures, syncope, weakness, light-headedness, numbness and headaches.   Hematological: denies adenopathy, easy bruising, personal or family bleeding history.  Psychiatric/ Behavioral: denies suicidal ideation, mood changes,  confusion, nervousness, sleep disturbance and agitation.       All other systems are reviewed and negative.    PHYSICAL EXAM: VS:  BP 156/100 mmHg  Pulse 77  Ht 5\' 6"  (1.676 m)  Wt 296 lb 9.6 oz (134.537 kg)  BMI 47.90 kg/m2  SpO2 94% , BMI Body mass index is 47.9 kg/(m^2). GEN: Well nourished, well developed, in no acute distress, morbidly obese. HEENT: normal Neck: no JVD, carotid bruits, or masses Cardiac: RRR; no murmurs, rubs, or gallops,no edema  Respiratory:  clear to auscultation bilaterally, normal work of breathing GI: soft, nontender, nondistended, + BS MS: no deformity or atrophy Skin: warm and dry, no rash Neuro:  Strength and sensation are intact Psych: normal   EKG:  EKG is not ordered today.    Recent Labs: 04/07/2014:  BUN 23; Creatinine 1.16*; Potassium 4.5; Sodium 136    Lipid Panel    Component Value Date/Time   CHOL 187 11/22/2011 1621   TRIG 86 11/22/2011 1621   HDL 61 11/22/2011 1621   CHOLHDL 3.1 11/22/2011 1621   VLDL 17 11/22/2011 1621   LDLCALC 109* 11/22/2011 1621   LDLDIRECT 203* 02/25/2009 2033      Wt Readings from Last 3 Encounters:  06/03/14 296 lb 9.6 oz (134.537 kg)  05/21/14 298 lb 4.8 oz (135.308 kg)  04/18/14 298 lb (135.172 kg)      Other studies Reviewed: Additional studies/ records that were reviewed today include: . Review of the above records demonstrates:    ASSESSMENT AND PLAN:  1. Hypertension 2 chest pain 3. Diabetes mellitus 4. Hyperlipidemia 5. Morbid obesity 6. Pulmonary sarcoidosis 7. Back pain   Current medicines are reviewed at length with the patient today.  The patient does not have concerns regarding medicines.  The following changes have been made:   Add spironolactone 25 mg a day.    Disposition:   FU with me in 2-3 months.  Once her BP is well controlled. Will turn her back over to her primary medical doctor.     Signed, Khaleem Burchill, Wonda Cheng, MD  06/03/2014 11:57 AM    [address], [place], [place]  [address] Phone: [phone]; Fax: [phone]

## 2014-06-03 NOTE — Patient Instructions (Addendum)
Your physician has recommended you make the following change in your medication:  START Aldactone (Spironolactone) 25 mg once daily - take in the AM  Your physician recommends that you have lab work:  [place] return for lab work in: 1 week and 1 month for Basic Metabolic Panel  Your physician recommends that you schedule a follow-up appointment in: 2-3 months with Dr. Acie Fredrickson.

## 2014-06-10 ENCOUNTER — Other Ambulatory Visit: Payer: 59

## 2014-06-11 ENCOUNTER — Encounter: Payer: Self-pay | Admitting: Gastroenterology

## 2014-06-28 ENCOUNTER — Encounter (HOSPITAL_BASED_OUTPATIENT_CLINIC_OR_DEPARTMENT_OTHER): Payer: 59

## 2014-07-09 ENCOUNTER — Other Ambulatory Visit: Payer: 59

## 2014-08-17 ENCOUNTER — Encounter (HOSPITAL_BASED_OUTPATIENT_CLINIC_OR_DEPARTMENT_OTHER): Payer: 59

## 2014-08-18 ENCOUNTER — Ambulatory Visit: Payer: 59 | Admitting: Cardiovascular Disease

## 2014-08-23 ENCOUNTER — Encounter: Payer: Self-pay | Admitting: Cardiovascular Disease

## 2014-08-23 ENCOUNTER — Ambulatory Visit (INDEPENDENT_AMBULATORY_CARE_PROVIDER_SITE_OTHER): Payer: 59 | Admitting: Cardiovascular Disease

## 2014-08-23 VITALS — BP 134/94 | HR 61 | Ht 66.0 in | Wt 301.0 lb

## 2014-08-23 DIAGNOSIS — I10 Essential (primary) hypertension: Secondary | ICD-10-CM

## 2014-08-23 DIAGNOSIS — R0789 Other chest pain: Secondary | ICD-10-CM

## 2014-08-23 LAB — BASIC METABOLIC PANEL
BUN: 31 mg/dL — AB (ref 6–23)
CHLORIDE: 102 meq/L (ref 96–112)
CO2: 26 mEq/L (ref 19–32)
CREATININE: 1.27 mg/dL — AB (ref 0.40–1.20)
Calcium: 9.6 mg/dL (ref 8.4–10.5)
GFR: 53.85 mL/min — AB (ref >60.00)
Glucose, Bld: 174 mg/dL — ABNORMAL HIGH (ref 70–99)
Potassium: 5.1 mEq/L (ref 3.5–5.1)
Sodium: 134 mEq/L — ABNORMAL LOW (ref 135–145)

## 2014-08-23 NOTE — Patient Instructions (Signed)
Medication Instructions:  Your physician recommends that you continue on your current medications as directed. Please refer to the Current Medication list given to you today.   Labwork: TODAY - basic metabolic panel  Testing/Procedures: None  Follow-Up: Your physician recommends that you schedule a follow-up appointment in: as needed with Dr. Acie Fredrickson

## 2014-08-23 NOTE — Progress Notes (Addendum)
Cardiology Office Note   Date:  08/23/2014   ID:  Rebecca Hicks, DOB 1945-12-07, MRN [id number]  PCP:  Zigmund Gottron, MD  Cardiologist:   Thayer Headings, MD   Chief Complaint  Patient presents with  . Hypertension   1. Hypertension 2 chest pain 3. Diabetes mellitus 4. Hyperlipidemia 5. Morbid obesity 6. Pulmonary sarcoidosis 7. Back pain  History of Present Illness: Nov. 3, 2015:  Rebecca Hicks is referred by an urgent care She presented with some neck pain. She has been having some CP and right arm pain  The discomfort is not associated with any specific activity. She has not been walking due to back pain for the past 5 months. She gets Fatigue with walking but does not have specific chest or shoulder pain. No associated with eating or drinking, not associated with taking a deep breath. May possibly be related to increased anxiety.  The pain is in the center of the chest . Is a tightness, Last 15-20 minutes. 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 11, 2016:  Rebecca Hicks is a 69 y.o. female who presents for follow up of her chest tightness  She had a myoview in [place] which was low risk for CAD. She is feeling better. No further episodes of chest pain.  BP is elevated.  Dr. Andria Frames just started clonidine patch  Aug 23, 2014 No CP ,  Having lots of sweating issues.     Past Medical History  Diagnosis Date  . Sarcoid   . Hypertension   . Diabetes mellitus   . Hyperlipidemia   . Morbid obesity   . Polyneuropathy in diabetes(357.2)   . Arthritis   . Cataract   . Glaucoma   . Neuromuscular disorder     Past Surgical History  Procedure Laterality Date  . Abdominal hysterectomy    . Spine surgery      lumbar laminectomy X 2  . Appendectomy    . Eye surgery    . Cataract extraction, bilateral  03/2010, 04/2010    . Tonsillectomy       Current Outpatient Prescriptions  Medication Sig Dispense Refill  . amLODipine (NORVASC) 10 MG tablet Take 1 tablet (10 mg total) by mouth daily. 90 tablet 3  . aspirin 81 MG tablet Take 81 mg by mouth daily.      . BD ULTRA-FINE PEN NEEDLES 29G X 12.7MM MISC Use two times daily 180 each 3  . cloNIDine (CATAPRES - DOSED IN MG/24 HR) 0.2 mg/24hr patch Place 1 patch (0.2 mg total) onto the skin once a week. 4 patch 3  . diphenoxylate-atropine (LOMOTIL) 2.5-0.025 MG per tablet Take 1 tablet by mouth 4 (four) times daily as needed for diarrhea or loose stools. 120 tablet 6  . DULoxetine (CYMBALTA) 30 MG capsule Take 1 capsule (30 mg total) by mouth daily. 30 capsule 3  . furosemide (LASIX) 20 MG tablet Take 1 tablet (20 mg total) by mouth daily. 90 tablet 3  . gabapentin (NEURONTIN) 300 MG capsule Take 1 capsule (300 mg total) by mouth 3 (three) times daily. (Patient taking differently: Take 300 mg by mouth 2 (two) times daily. ) 270 capsule 3  . Insulin Isophane & Regular Human (HUMULIN 70/30 KWIKPEN) (70-30) 100 UNIT/ML PEN Take 70 every am & 60 units before evening meal.  15 mL   . metFORMIN (GLUCOPHAGE) 1000 MG tablet Take 1 tablet (1,000 mg total) by mouth 2 (two) times daily. 180 tablet 3  . metoprolol (TOPROL-XL) 200 MG 24 hr tablet Take 1 tablet daily (Patient taking differently: Take 1 tablet by mputh daily) 90 tablet 3  . nitroGLYCERIN (NITROSTAT) 0.4 MG SL tablet Place 1 tablet (0.4 mg total) under the tongue every 5 (five) minutes as needed for chest pain. If 3rd tablet needed - go to emergency room or call 911. 50 tablet 0  . ONE TOUCH ULTRA TEST test strip Test 3 times daily 300 each 3  . rosuvastatin (CRESTOR) 40 MG tablet Take 1 tablet (40 mg total) by mouth daily. (Patient taking differently: Take 40 mg by mouth daily. PT STATES SHE IS TAKING 3 TIMES PER WEEK) 90 tablet 3  . spironolactone (ALDACTONE) 25 MG tablet Take 1 tablet (25 mg total) by mouth daily. 31  tablet 11  . telmisartan-hydrochlorothiazide (MICARDIS HCT) 80-12.5 MG per tablet Take 1 tablet by mouth  daily 90 tablet 3  . timolol (TIMOPTIC) 0.25 % ophthalmic solution 2 (two) times daily. Per optho     . traMADol (ULTRAM) 50 MG tablet Take 1 tablet (50 mg total) by mouth every 8 (eight) hours as needed. 90 tablet 5  . zoster vaccine live, PF, (ZOSTAVAX) 99371 UNT/0.65ML injection Inject 19,400 Units into the skin once. 1 each 0   No current facility-administered medications for this visit.    Allergies:   Penicillins    Social History:  The patient  reports that she quit smoking about 19 years ago. Her smoking use included Cigarettes. She has a 20 pack-year smoking history. She has never used smokeless tobacco. She reports that she does not drink alcohol or use illicit drugs.   Family History:  The patient's family history includes Alcohol abuse in her brother and father; Breast cancer in her maternal aunt; Cervical cancer in her mother; Diabetes in an other family member; Heart disease in her brother; Hypertension in an other family member; Pancreatic cancer in her maternal uncle; Stroke in her father and paternal grandmother.    ROS:  Please see the history of present illness.    Review of Systems: Constitutional:  denies fever, chills, diaphoresis, appetite change and fatigue.  HEENT: denies photophobia, eye pain, redness, hearing loss, ear pain, congestion, sore throat, rhinorrhea, sneezing, neck pain, neck stiffness and tinnitus.  Respiratory: denies SOB, DOE, cough, chest tightness, and wheezing.  Cardiovascular: denies chest pain, palpitations and leg swelling.  Gastrointestinal: denies nausea, vomiting, abdominal pain, diarrhea, constipation, blood in stool.  Genitourinary: denies dysuria, urgency, frequency, hematuria, flank pain and difficulty urinating.  Musculoskeletal: denies  myalgias, back pain, joint swelling, arthralgias and gait problem.   Skin: denies pallor, rash  and wound.  Neurological: denies dizziness, seizures, syncope, weakness, light-headedness, numbness and headaches.   Hematological: denies adenopathy, easy bruising, personal or family bleeding history.  Psychiatric/ Behavioral: denies suicidal ideation, mood changes, confusion, nervousness, sleep disturbance and agitation.       All other systems are reviewed and negative.    PHYSICAL EXAM: VS:  BP 134/94 mmHg  Pulse 61  Ht 5\' 6"  (1.676 m)  Wt 301 lb (136.533 kg)  BMI 48.61 kg/m2 , BMI Body mass index is 48.61 kg/(m^2). GEN: Well nourished, well developed, in no acute distress, morbidly obese. HEENT: normal Neck: no JVD, carotid bruits, or masses Cardiac: RRR; no murmurs, rubs, or gallops,no edema  Respiratory:  clear to  auscultation bilaterally, normal work of breathing GI: soft, nontender, nondistended, + BS MS: no deformity or atrophy Skin: warm and dry, no rash Neuro:  Strength and sensation are intact Psych: normal   EKG:  EKG is not ordered today.    Recent Labs: 06/03/2014: BUN 18; Creatinine 0.90; Potassium 4.4; Sodium 137    Lipid Panel    Component Value Date/Time   CHOL 187 11/22/2011 1621   TRIG 86 11/22/2011 1621   HDL 61 11/22/2011 1621   CHOLHDL 3.1 11/22/2011 1621   VLDL 17 11/22/2011 1621   LDLCALC 109* 11/22/2011 1621   LDLDIRECT 203* 02/25/2009 2033      Wt Readings from Last 3 Encounters:  08/23/14 301 lb (136.533 kg)  06/03/14 296 lb 9.6 oz (134.537 kg)  05/21/14 298 lb 4.8 oz (135.308 kg)      Other studies Reviewed: Additional studies/ records that were reviewed today include: . Review of the above records demonstrates:    ASSESSMENT AND PLAN:  1. Hypertension - much better.  Continue meds. We will check a basic medical profile today. Her blood pressure remained stable. At this point I do not anticipate doing any further cardiac workup. I stressed to her the importance of weight loss, exercise, and following a good diet. She will  follow-up with her medical doctor in the [facility] practice clinic.   2 chest pain - no further episodes of CP   3. Diabetes mellitus  4. Hyperlipidemia- managed by her medical doctor  5. Morbid obesity 6. Pulmonary sarcoidosis 7. Back pain   Current medicines are reviewed at length with the patient today.  The patient does not have concerns regarding medicines.  The following changes have been made:      Disposition:   FU with me as needed.    Signed, Nahser, Wonda Cheng, MD  08/23/2014 9:19 AM    [facility] [address], [place], [place]  [address] Phone: [phone]; Fax: [phone]

## 2014-08-27 ENCOUNTER — Telehealth: Payer: Self-pay | Admitting: Nurse Practitioner

## 2014-08-27 NOTE — Telephone Encounter (Signed)
Reviewed lab results and plan of care with patient who verbalized understanding and agreement.

## 2014-08-27 NOTE — Telephone Encounter (Signed)
-----   Message from Thayer Headings, MD sent at 08/24/2014 12:04 PM EDT ----- Labs are ok.  K is borderline elevated.  She should decrease her intake of foods that are high in potassium She may also DC her lasix ( she is also on Aldactone and low dose HCTZ and probably does not need to be on 3 diuretics) . She is going to follow up with her medical doctor

## 2014-10-18 ENCOUNTER — Other Ambulatory Visit: Payer: Self-pay

## 2014-12-04 ENCOUNTER — Other Ambulatory Visit: Payer: Self-pay | Admitting: Family Medicine

## 2015-01-12 ENCOUNTER — Ambulatory Visit: Payer: 59 | Admitting: Family Medicine

## 2015-01-19 ENCOUNTER — Ambulatory Visit: Payer: 59 | Admitting: Family Medicine

## 2015-01-20 ENCOUNTER — Ambulatory Visit (INDEPENDENT_AMBULATORY_CARE_PROVIDER_SITE_OTHER): Payer: 59 | Admitting: Family Medicine

## 2015-01-20 ENCOUNTER — Ambulatory Visit: Payer: 59 | Admitting: Emergency Medicine

## 2015-01-20 VITALS — BP 181/91 | HR 102 | Temp 97.8°F | Wt 298.4 lb

## 2015-01-20 DIAGNOSIS — I10 Essential (primary) hypertension: Secondary | ICD-10-CM | POA: Diagnosis not present

## 2015-01-20 DIAGNOSIS — E785 Hyperlipidemia, unspecified: Secondary | ICD-10-CM

## 2015-01-20 DIAGNOSIS — D126 Benign neoplasm of colon, unspecified: Secondary | ICD-10-CM | POA: Diagnosis not present

## 2015-01-20 DIAGNOSIS — E1165 Type 2 diabetes mellitus with hyperglycemia: Secondary | ICD-10-CM

## 2015-01-20 DIAGNOSIS — R300 Dysuria: Secondary | ICD-10-CM | POA: Diagnosis not present

## 2015-01-20 DIAGNOSIS — IMO0002 Reserved for concepts with insufficient information to code with codable children: Secondary | ICD-10-CM

## 2015-01-20 DIAGNOSIS — R197 Diarrhea, unspecified: Secondary | ICD-10-CM

## 2015-01-20 DIAGNOSIS — E1140 Type 2 diabetes mellitus with diabetic neuropathy, unspecified: Secondary | ICD-10-CM

## 2015-01-20 DIAGNOSIS — M5440 Lumbago with sciatica, unspecified side: Secondary | ICD-10-CM

## 2015-01-20 DIAGNOSIS — N182 Chronic kidney disease, stage 2 (mild): Secondary | ICD-10-CM

## 2015-01-20 LAB — COMPREHENSIVE METABOLIC PANEL
ALK PHOS: 85 U/L (ref 33–130)
ALT: 15 U/L (ref 6–29)
AST: 18 U/L (ref 10–35)
Albumin: 3.8 g/dL (ref 3.6–5.1)
BILIRUBIN TOTAL: 0.5 mg/dL (ref 0.2–1.2)
BUN: 31 mg/dL — AB (ref 7–25)
CO2: 23 mmol/L (ref 20–31)
CREATININE: 1.09 mg/dL — AB (ref 0.50–0.99)
Calcium: 9.5 mg/dL (ref 8.6–10.4)
Chloride: 102 mmol/L (ref 98–110)
GLUCOSE: 151 mg/dL — AB (ref 65–99)
Potassium: 4.3 mmol/L (ref 3.5–5.3)
SODIUM: 137 mmol/L (ref 135–146)
Total Protein: 7.2 g/dL (ref 6.1–8.1)

## 2015-01-20 LAB — LIPID PANEL
Cholesterol: 372 mg/dL — ABNORMAL HIGH (ref 125–200)
HDL: 90 mg/dL (ref >=46)
LDL CALC: 272 mg/dL — AB (ref <130)
Total CHOL/HDL Ratio: 4.1 Ratio (ref <=5.0)
Triglycerides: 51 mg/dL (ref <150)
VLDL: 10 mg/dL (ref <30)

## 2015-01-20 LAB — POCT URINALYSIS DIPSTICK
Bilirubin, UA: NEGATIVE
Glucose, UA: NEGATIVE
Ketones, UA: NEGATIVE
LEUKOCYTES UA: NEGATIVE
NITRITE UA: NEGATIVE
PH UA: 5.5
Protein, UA: 100
Spec Grav, UA: >=1.03
UROBILINOGEN UA: 0.2

## 2015-01-20 LAB — POCT GLYCOSYLATED HEMOGLOBIN (HGB A1C): Hemoglobin A1C: 9.2

## 2015-01-20 MED ORDER — INSULIN ISOPHANE & REGULAR (HUMAN 70-30)100 UNIT/ML KWIKPEN: PEN_INJECTOR | SUBCUTANEOUS | Status: DC

## 2015-01-20 MED ORDER — ZOSTER VACCINE LIVE 19400 UNT/0.65ML ~~LOC~~ SOLR: 0.6500 mL | Freq: Once | SUBCUTANEOUS | Status: DC

## 2015-01-20 MED ORDER — METFORMIN HCL 1000 MG PO TABS: 500.0000 mg | ORAL_TABLET | Freq: Two times a day (BID) | ORAL | Status: DC

## 2015-01-20 MED ORDER — SPIRONOLACTONE 25 MG PO TABS: 25.0000 mg | ORAL_TABLET | Freq: Every day | ORAL | Status: DC

## 2015-01-20 MED ORDER — DULOXETINE HCL 30 MG PO CPEP: 30.0000 mg | ORAL_CAPSULE | Freq: Every day | ORAL | Status: DC

## 2015-01-20 MED ORDER — CLONIDINE HCL 0.1 MG PO TABS: 0.1000 mg | ORAL_TABLET | Freq: Three times a day (TID) | ORAL | Status: DC

## 2015-01-20 NOTE — Patient Instructions (Signed)
Get your shingles vaccine I changed you to clonidine pills.  Don't skip doses with that medication OK to build up to 1/2 metformin twice a day.  Good luck with the diarrhea. I will call with lab results. See me in one month. Flu shot next month.

## 2015-01-20 NOTE — Assessment & Plan Note (Signed)
Reorder dulox

## 2015-01-20 NOTE — Progress Notes (Signed)
   Subjective:    Patient ID: Rebecca Hicks, female    DOB: Sep 03, 1945, 69 y.o.   MRN: [id number]  HPI Had recent eye exam.  I do not have records yet.   As is her habit, Rebecca Hicks has been away too long and has a long backload of issues 1. BP elevated.  Has been off clonidine patch for months due to rash.  States she is taking other BP meds. 2. Frequent urination.  No fever urgency or dysuria.  Does have uncontroled DM. 3. Poorly controled DM.  Compliance with insulin OK.  Takes 2-3 times per day.  Concern about metformin due to diarrhea. Sparks 3 weeks ago.  Injured back.  Has been getting steroid shots, which she knows increases her BS.  Also had steroid injection of knees. 5. States only taking crestor 2-3 times per week due to concern about myalgias.    Review of Systems     Objective:   Physical Exam Lungs clear Cardiac RRR without m or g Abd benign Ext 1+ bilateral edema         Assessment & Plan:

## 2015-01-20 NOTE — Assessment & Plan Note (Addendum)
Fasting this morning Called after lab review.  Marked elevated LDL.  Needs Crestor daily.  States she will comply.  FU dLDL in 3 months.

## 2015-01-21 ENCOUNTER — Encounter: Payer: Self-pay | Admitting: Family Medicine

## 2015-01-21 DIAGNOSIS — R197 Diarrhea, unspecified: Secondary | ICD-10-CM

## 2015-01-21 HISTORY — DX: Diarrhea, unspecified: R19.7

## 2015-01-21 NOTE — Assessment & Plan Note (Signed)
Check labs 

## 2015-01-21 NOTE — Assessment & Plan Note (Signed)
Poor control.  Switch to oral clonidine.

## 2015-01-21 NOTE — Assessment & Plan Note (Signed)
Just a little progress here.  Wt is below 300

## 2015-01-21 NOTE — Assessment & Plan Note (Signed)
Trial off metformin.  I really want her on the highest dose she can tolerate since she clearly has insulin resistence.

## 2015-01-21 NOTE — Assessment & Plan Note (Signed)
Poor control.  Compliance and steroid injections are both issues.

## 2015-01-21 NOTE — Assessment & Plan Note (Signed)
Per ortho.  Try to minimize steroid injections due to dM

## 2015-02-03 ENCOUNTER — Other Ambulatory Visit: Payer: Self-pay | Admitting: Family Medicine

## 2015-02-15 ENCOUNTER — Encounter: Payer: Self-pay | Admitting: Emergency Medicine

## 2015-02-15 ENCOUNTER — Ambulatory Visit (INDEPENDENT_AMBULATORY_CARE_PROVIDER_SITE_OTHER): Payer: 59 | Admitting: Emergency Medicine

## 2015-02-15 VITALS — BP 120/80 | HR 78 | Ht 66.0 in | Wt 301.0 lb

## 2015-02-15 DIAGNOSIS — D869 Sarcoidosis, unspecified: Secondary | ICD-10-CM | POA: Diagnosis not present

## 2015-02-15 DIAGNOSIS — Z23 Encounter for immunization: Secondary | ICD-10-CM | POA: Diagnosis not present

## 2015-02-15 MED ORDER — ALBUTEROL SULFATE HFA 108 (90 BASE) MCG/ACT IN AERS: 2.0000 | INHALATION_SPRAY | Freq: Four times a day (QID) | RESPIRATORY_TRACT | Status: DC | PRN

## 2015-02-15 NOTE — Assessment & Plan Note (Signed)
We will repeat your CT scan of the chest (no contrast) We will start albuterol 2 puffs if needed for shortness of breath  We will repeat your spirometry today   Depending on your scan and your symptoms we will decide whether to start prednisone or an every-day inhaler.  Follow with Dr Lamonte Sakai next available to review your testing.

## 2015-02-15 NOTE — Assessment & Plan Note (Signed)
Discussed OSA today. We will consider PSG in the future, revisit next time.

## 2015-02-15 NOTE — Progress Notes (Signed)
69 yo woman with cutaneous and pulmonary sarcoidosis.   ROV 09/15/08 -- last seen in 04/2007. Now off prednisone for over a year. Has had some episodic SOB, some worsening of cutaneous lesions on knees, hands. Both breathing and rash are both improving without intervention.   ROV 01/26/10 -- 64 hx sarcoidosis, HTN, DM, obesity. Last seen May 2010. She reports a flare of her sarcoid rash and difficulty breathing this June, treated with pred x 1 month, symptoms improved. Then in August a different kind of dyspnea - ? some volume overload + panic. Eval included TTE w some mild diastolic dysfxn, restarted lasix with improvement. Breathing feels normal unless she gets anxious.  ROV 01/24/11 -- 69 yo woman, sarcoidosis, HTN, obesity.  Returns today, describes some skin sarcoid on legs last 2 months. Treated with a stronger steroid cream w some resolution. Then over last month more exertional SOB, for example when walking across parking lot. She has lost some wt compared w last visit, has done some wt watchers.   ROV 02/21/11 -- 69 yo woman, sarcoidosis, HTN, obesity, DM. CT scan done 01/31/11 - LAD resolved and no parenchymal disease seen. She is still having episodic SOB, anxiety.  She has gained some wt over the last month, about 3 -4 lbs. Just rejoined wt watchers.   Acute OV 06/12/11 --  Presents for recheck from recent bronchitis and ?sarcoid flre. Says that she has had 2 flare of cutaneous flare of sarcoid over last 6 weeks that resolved now with steroid taper x 2 . Developed cough and congestion 1 week ago, given zpack and prednisone taper. Cough is better but not resolved. Has some drainage. Cough is keeping her up at night. No fever or chest pain . CXR showed no acute process. She is feeling better but not completely better.  No hemoptysis , chest pain , edema.   ROV 05/20/12 -- 66 hx sarcoidosis. She has had 2 episodes of rash on her knees since last visit that looked like sarcoid. Her breathing has been  stable, although she is more limited w exertional than she would like to be. Optho exams have been reassuring (she does have glaucoma, cataracts). No cough or wheeze.  No steroids since last time.   ROV 02/15/15 -- 69 year old woman who follows up today for sarcoidosis.  She describes some rash that has affected her face and knees over the last few months. She has had some joint injections of cortisone. Has also use topical cortisone. The rash is red and bumpy. She has also had some progressive dyspnea over this time. No wheeze. Not on BD's.    Exam:  Filed Vitals:   02/15/15 1614 02/15/15 1618  BP:  120/80  Pulse:  78  Height: 5\' 6"  (1.676 m)   Weight: 301 lb (136.533 kg)   SpO2:  96%    Gen: Pleasant, obese, in no distress,  normal affect, obese   ENT: No lesions,  mouth clear,  oropharynx clear, no postnasal drip  Neck: No JVD, no TMG, no carotid bruits  Lungs: No use of accessory muscles, no dullness to percussion, coarse BS w/ no wheezing   Cardiovascular: RRR, heart sounds normal, no murmur or gallops, no peripheral edema  Musculoskeletal: No deformities, no cyanosis or clubbing  Neuro: alert, non focal  Skin: Warm,  LE and knees without rash.    CT CHEST WITHOUT CONTRAST 02/01/11 :  Technique: Multidetector CT imaging of the chest was performed  following the standard protocol without  IV contrast.  Comparison: None 06/04/2005  Findings:  There is no enlarged axillary or supraclavicular adenopathy.  Precarinal lymph node measures 0.9 cm, image 16. Improved from 1.9  cm previously. Subcarinal lymph node measures 0.5 cm, image 21.  Improved from 1.8 cm previously. Calcified AP window lymph node  measures 0.6 cm, image 18. Previously 1.2 cm.  No pericardial or pleural effusion identified.  There is no airspace consolidation identified.  No atelectasis or scarring identified. Calcified granuloma is  present in the left upper lobe.  No evidence for interstitial  reticulation or bronchiectasis.  Review of the visualized osseous structures is significant for mild  thoracic spondylosis.  IMPRESSION:  1. Improvement and mediastinal and hilar adenopathy.  2. No pulmonary parenchymal manifestations of sarcoid.   SARCOIDOSIS, PULMONARY We will repeat your CT scan of the chest (no contrast) We will start albuterol 2 puffs if needed for shortness of breath  We will repeat your spirometry today   Depending on your scan and your symptoms we will decide whether to start prednisone or an every-day inhaler.  Follow with Dr Lamonte Sakai next available to review your testing.   Morbid obesity Discussed OSA today. We will consider PSG in the future, revisit next time.

## 2015-02-15 NOTE — Patient Instructions (Addendum)
We will repeat your CT scan of the chest (no contrast) We will start albuterol 2 puffs if needed for shortness of breath  We will repeat your spirometry today   Depending on your scan and your symptoms we will decide whether to start prednisone or an every-day inhaler.  We will revisit performing a possible sleep study at your next visit.  Follow with Dr Lamonte Sakai next available to review your testing.

## 2015-02-22 ENCOUNTER — Inpatient Hospital Stay: Admission: RE | Admit: 2015-02-22 | Payer: 59 | Source: Ambulatory Visit

## 2015-02-24 ENCOUNTER — Ambulatory Visit (INDEPENDENT_AMBULATORY_CARE_PROVIDER_SITE_OTHER): Payer: 59 | Admitting: Family Medicine

## 2015-02-24 ENCOUNTER — Encounter: Payer: Self-pay | Admitting: Family Medicine

## 2015-02-24 VITALS — BP 127/65 | HR 59 | Temp 97.9°F | Ht 66.0 in | Wt 299.5 lb

## 2015-02-24 DIAGNOSIS — J069 Acute upper respiratory infection, unspecified: Secondary | ICD-10-CM

## 2015-02-24 NOTE — Progress Notes (Signed)
Subjective: KASHLYNN KUNDERT is a 69 y.o. female with a history of HTN, uncontrolled IDDM with neuropathy, CKD, and pulmonary sarcoidosis presenting for multiple complaints.  3 - 4 days of nausea with some vomiting causing her to stay home from work. Eating lightly but drinking frequently. She has also had shaking in her hands and subtle bilateral headache, feels "out of sorts" and like "something's not clicking." Also with runny nose without cough or fever.  Clonidine was switched at PCP visit 9/29 from patch to oral formulation and she reports compliance with this.   + blurred vision, endorses frequent, unchanged nocturia (~3x/night). CBGs are running "pretty good," < 150mg /dl in the mornings but still over 200 in evenings. Lowest recently has been 56 in mid morning which was accompanied by shakes, imbalance. Highest has been over 300 a couple of times.  - ROS: She denies orthostatic lightheadedness but does feel the need to hold on to things for help with balance. Denies worsening in shortness of breath or breakouts of cutaneous sarcoid manifestations.  - Non-smoker  Objective: BP 127/65 mmHg  Pulse 59  Temp(Src) 97.9 F (36.6 C) (Oral)  Ht 5\' 6"  (1.676 m)  Wt 299 lb 8 oz (135.852 kg)  BMI 48.36 kg/m2 Gen: Pleasant 69 y.o. female in no distress HEENT: Normocephalic, sclerae clear, conjunctivae normal, pupils equal and reactive, nares normal, moist mucous membranes, posterior oropharynx clear CV: Regular rate, no murmur; radial, DP and PT pulses 2+ bilaterally; trace LE edema, no JVD, cap refill < 2 sec. Pulm: Non-labored breathing ambient air; CTAB, no wheezes or crackles Neuro: Alert and oriented, no focal deficits in sensation or motor function. Gait slow and normal. Speech normal.   Assessment/Plan: KEI LANGHORST is a 69 y.o. female here for nonspecific complaints.  I'm honestly not certain what the primary problem is underlying her concerns. It's possible that she has a run of  the mill cold and maybe this is affecting her glucose control. Her BP looks great and she had no orthostatic symptoms in clinic. No signs of primary neurologic disorders. I offered reassurance and recommended that she return if symptoms worsen or remain in another week or so.

## 2015-03-01 ENCOUNTER — Ambulatory Visit (INDEPENDENT_AMBULATORY_CARE_PROVIDER_SITE_OTHER)
Admission: RE | Admit: 2015-03-01 | Discharge: 2015-03-01 | Disposition: A | Discharge status: Home / Self Care | Payer: 59 | Source: Ambulatory Visit | Attending: Emergency Medicine | Admitting: Emergency Medicine

## 2015-03-01 DIAGNOSIS — D869 Sarcoidosis, unspecified: Secondary | ICD-10-CM | POA: Diagnosis not present

## 2015-03-11 ENCOUNTER — Telehealth: Payer: Self-pay | Admitting: Emergency Medicine

## 2015-03-11 NOTE — Telephone Encounter (Signed)
I spoke with patient about results and she verbalized understanding and had no questions 

## 2015-03-11 NOTE — Telephone Encounter (Signed)
Called and spoke with pt Pt requesting results of CT scan Informed pt that results are not back yet but that i would send a message to [facility] stating she would like results

## 2015-03-11 NOTE — Telephone Encounter (Signed)
Please let her know that her Ct scan shows some stable enlargement of the chest lymph nodes (unchanged) and no evidence for any scarring of lung involvement from her sarcoidosis. Thanks

## 2015-03-14 ENCOUNTER — Other Ambulatory Visit: Payer: Self-pay | Admitting: Orthopedic Surgery

## 2015-03-14 ENCOUNTER — Ambulatory Visit (AMBULATORY_SURGERY_CENTER): Payer: Self-pay | Admitting: *Deleted

## 2015-03-14 VITALS — Ht 66.0 in | Wt 304.0 lb

## 2015-03-14 DIAGNOSIS — M545 Low back pain: Secondary | ICD-10-CM

## 2015-03-14 DIAGNOSIS — Z86010 Personal history of colonic polyps: Secondary | ICD-10-CM

## 2015-03-14 MED ORDER — NA SULFATE-K SULFATE-MG SULF 17.5-3.13-1.6 GM/177ML PO SOLN: 1.0000 | Freq: Once | ORAL | Status: DC

## 2015-03-14 NOTE — Progress Notes (Signed)
No egg or soy allergy No issues with past sedation No diet pills No home 02 use   emmi video to e mail       [email or web address]

## 2015-03-16 ENCOUNTER — Ambulatory Visit (INDEPENDENT_AMBULATORY_CARE_PROVIDER_SITE_OTHER): Payer: 59 | Admitting: Family Medicine

## 2015-03-16 ENCOUNTER — Encounter: Payer: Self-pay | Admitting: Family Medicine

## 2015-03-16 VITALS — BP 158/63 | HR 53 | Temp 97.6°F | Ht 66.0 in | Wt 304.9 lb

## 2015-03-16 DIAGNOSIS — I10 Essential (primary) hypertension: Secondary | ICD-10-CM

## 2015-03-16 NOTE — Progress Notes (Signed)
   Subjective:    Patient ID: Rebecca Hicks, female    DOB: 07/09/1945, 69 y.o.   MRN: [id number]  HPI Rebecca Hicks is follow up of hypertension.  She started clonidine and felt unsteady for the first few weeks.  Concerningly, she had a brief 5 minute spell of confusion (I was in the shower and for a few moments, could not figure how to get out.)   BS and Blood pressure on home readings have been fine, no lows.   Last A1C=9 Now feeling mostly back to normal.  A bit of lightheadedness - she thinks she is on too much medication.  Definitely better than 3 weeks ago.   Review of Systems     Objective:   Physical Exam BP confirmed Lungs clear Cardiac RRR        Assessment & Plan:

## 2015-03-16 NOTE — Assessment & Plan Note (Signed)
Broad differential of the lightheadedness and spell of confusion.  I favor the dx that she was/is having some mild orthostatic hypotension with the addition of clonidine.  Of course, she is at risk for TIA/CVA.  Lack of focal symptoms make this less likely.  Will start workup with a twenty-four hour BP monitor to gather more accurate data on leading diagnosis.  May need additional workup based on those results.

## 2015-03-16 NOTE — Progress Notes (Signed)
Patient ID: Rebecca Hicks, female   DOB: 1946-02-16, 70 y.o.   MRN: [id number]   Subjective: CC:  Unsteady gait HPI: This history was provided by the patient.  Patient has a history of hypertension, DM type 2, diabetic neuropathy, hyperlipidemia, glaucoma, CKD Stage II, and pulmonary sarcoidosis.  Patient is a 69 y.o. female presenting to clinic today for follow-up on an appointment she had earlier this month.  During that visit, patient complained of disorientation, dizziness, "subtle" bilateral headache and nasal congestion.  Patient states she had an episode at home in which she was trying to get out of the shower and she felt "shaky" and couldn't do it.    During last clinic visit, patient was diagnosed with a URI and started on antibiotics.  Patient has finished the antibiotics and her nasal congestion has resolved.  Patient states she continues to have the sensation that she is unsteady on her feet with occasional bilateral headache which she continues to describe as "sublte".  She does not describe the sensation as dizziness, but rather, "I feel shaky inside and like I can't get my balance."  Patient denies falling.  Patient also complains of feeling excessively sleepy.    Patient associates the onset of these symptoms with the addition of p.o. Clonidine in September of this year.  Patient states she occasionally has some wheezing related to pulmonary sarcoidosis which is managed by [facility].  Patient denies wheezing or dyspnea currently.    Patient denies loss of consciousness, confusion or loss of orientation to person, place, time or events. Patient denies changes in speech and slurring her words.  Patient denies N/V/D, fever, cough, nasal drainage, ear pain, tinnitus, chest pain, abdominal pain, unilateral weakness, generalized weakness and changes in vision.  Patient denies numbness or tingling in extremities or face.    Patient states she is checking her blood glucose at  home with a low of 68 and a high in the 200's over the past month.  Patient checks her BP at home as well on a daily basis and states her SBP is generally in the 150's with a DBP typically in the 70's or 80's.    Concerns today include:  1. Unsteady gait  Social History Reviewed: Former smoker, quit 1990. FamHx and MedHx updated.  Please see EMR. Health Maintenance: Zostavax - patient's insurance will not pay for her to get this in a pharmacy and we do not have it in clinic.  She will go to [facility] or [facility] and see if she can get just the vaccine.  ROS: Per HPI  Objective: Office vital signs reviewed. BP 158/63 mmHg  Pulse 53  Temp(Src) 97.6 F (36.4 C) (Oral)  Ht 5\' 6"  (1.676 m)  Wt 304 lb 14.4 oz (138.302 kg)  BMI 49.24 kg/m2  Physical Examination:  General: Pleasant, African American female who is awake, alert, well-nourished and in NAD Cardio: RRR, S1S2 heard, no murmurs appreciated, 1+ pretibial edema.  No cyanosis or clubbing; +2 radial and +1 dorsalis lateralis pulses bilaterally Pulm: CTAB, no wheezes, rhonchi or rales MSK: Normal gait and station.  Gait even without stumbling or swaying. Neuro: Strength and sensation grossly intact.  PERRL 72mm.  Patient is alert and oriented to person, place, time and events.  Assessment/ Plan: 69 y.o. female who presents with unsteady gait and feeling "shaky inside."  This has been ongoing since late October or early November of this year.  Patient associates onset of symptoms with  the addition of oral clonidine to her medication regimen in late September 2016.  Although patient reports reasonable control her BP at home, her BP measurements in clinic have been moderately elevated.  Her symptoms may be related to the addition of her Clonidine.  Patient is currently on 5 anti-hypertensive medications with only modest control of hypertension.  Patient's symptoms seem to be resolving and she reports she feels some better compared to how she  felt when she was seen earlier this month.  Essential hypertension - Prior to changing patient's anti-hypertensive medications, recommend that she meet with Dr. Valentina Lucks to arrange for home blood pressure monitoring.  This will give [place] a better idea of how well her BP is doing at home and if there is room for medication adjustment.  The addition of Clonidine may well be causing the symptoms she is experiencing, however, they seem to be improving over time.     Sherron Ales, NP Student [facility]

## 2015-03-16 NOTE — Patient Instructions (Addendum)
Please make an appointment on your way out to see Dr. Valentina Lucks to set you up with a blood pressure monitor at home.   I will call with results and we will decide where to go from there. For now, stay on all the same medications.

## 2015-03-23 ENCOUNTER — Ambulatory Visit: Payer: 59 | Admitting: Family Medicine

## 2015-03-24 ENCOUNTER — Other Ambulatory Visit: Payer: 59

## 2015-03-24 ENCOUNTER — Telehealth: Payer: Self-pay | Admitting: Gastroenterology

## 2015-03-24 NOTE — Telephone Encounter (Signed)
Patient states that she had had a "bad cold" for a week.  No fever.  She was wondering if she could take her cold medicine.  I told her yes, but to please call us if she starts running a fever over 100.  She said that she would be here tomorrow as planned.

## 2015-03-25 ENCOUNTER — Ambulatory Visit (AMBULATORY_SURGERY_CENTER): Payer: 59 | Admitting: Gastroenterology

## 2015-03-25 ENCOUNTER — Encounter: Payer: Self-pay | Admitting: Gastroenterology

## 2015-03-25 VITALS — BP 100/53 | HR 65 | Temp 97.9°F | Resp 17 | Ht 66.0 in | Wt 304.0 lb

## 2015-03-25 DIAGNOSIS — D123 Benign neoplasm of transverse colon: Secondary | ICD-10-CM

## 2015-03-25 DIAGNOSIS — D122 Benign neoplasm of ascending colon: Secondary | ICD-10-CM

## 2015-03-25 DIAGNOSIS — Z86010 Personal history of colonic polyps: Secondary | ICD-10-CM

## 2015-03-25 LAB — GLUCOSE, CAPILLARY
Glucose-Capillary: 237 mg/dL — ABNORMAL HIGH (ref 65–99)
Glucose-Capillary: 240 mg/dL — ABNORMAL HIGH (ref 65–99)

## 2015-03-25 MED ORDER — SODIUM CHLORIDE 0.9 % IV SOLN: 500.0000 mL | INTRAVENOUS | Status: DC

## 2015-03-25 NOTE — Patient Instructions (Signed)
YOU HAD AN ENDOSCOPIC PROCEDURE TODAY AT THE [facility]:   Refer to the procedure report that was given to you for any specific questions about what was found during the examination.  If the procedure report does not answer your questions, please call your gastroenterologist to clarify.  If you requested that your care partner not be given the details of your procedure findings, then the procedure report has been included in a sealed envelope for you to review at your convenience later.  YOU SHOULD EXPECT: Some feelings of bloating in the abdomen. Passage of more gas than usual.  Walking can help get rid of the air that was put into your GI tract during the procedure and reduce the bloating. If you had a lower endoscopy (such as a colonoscopy or flexible sigmoidoscopy) you may notice spotting of blood in your stool or on the toilet paper. If you underwent a bowel prep for your procedure, you may not have a normal bowel movement for a few days.  Please Note:  You might notice some irritation and congestion in your nose or some drainage.  This is from the oxygen used during your procedure.  There is no need for concern and it should clear up in a day or so.  SYMPTOMS TO REPORT IMMEDIATELY:   Following lower endoscopy (colonoscopy or flexible sigmoidoscopy):  Excessive amounts of blood in the stool  Significant tenderness or worsening of abdominal pains  Swelling of the abdomen that is new, acute  Fever of 100F or higher   For urgent or emergent issues, a gastroenterologist can be reached at any hour by calling [phone].   DIET: Your first meal following the procedure should be a small meal and then it is ok to progress to your normal diet. Heavy or fried foods are harder to digest and may make you feel nauseous or bloated.  Likewise, meals heavy in dairy and vegetables can increase bloating.  Drink plenty of fluids but you should avoid alcoholic beverages for 24  hours.  ACTIVITY:  You should plan to take it easy for the rest of today and you should NOT DRIVE or use heavy machinery until tomorrow (because of the sedation medicines used during the test).    FOLLOW UP: Our staff will call the number listed on your records the next business day following your procedure to check on you and address any questions or concerns that you may have regarding the information given to you following your procedure. If we do not reach you, we will leave a message.  However, if you are feeling well and you are not experiencing any problems, there is no need to return our call.  We will assume that you have returned to your regular daily activities without incident.  If any biopsies were taken you will be contacted by phone or by letter within the next 1-3 weeks.  Please call us at [phone] if you have not heard about the biopsies in 3 weeks.    SIGNATURES/CONFIDENTIALITY: You and/or your care partner have signed paperwork which will be entered into your electronic medical record.  These signatures attest to the fact that that the information above on your After Visit Summary has been reviewed and is understood.  Full responsibility of the confidentiality of this discharge information lies with you and/or your care-partner. 

## 2015-03-25 NOTE — Progress Notes (Signed)
Report to PACU, RN, vss, BBS= Clear.  

## 2015-03-25 NOTE — Progress Notes (Signed)
Called to room to assist during endoscopic procedure.  Patient ID and intended procedure confirmed with present staff. Received instructions for my participation in the procedure from the performing physician.  

## 2015-03-25 NOTE — Op Note (Signed)
[place]  [address] [place], [id number]   COLONOSCOPY PROCEDURE REPORT  PATIENT: Rebecca Hicks, Rebecca Hicks  MR#: [id number] BIRTHDATE: 1946-03-04 , 11  yrs. old GENDER: female ENDOSCOPIST: Milus Banister, MD PROCEDURE DATE:  03/25/2015 PROCEDURE:   Colonoscopy, surveillance and Colonoscopy with snare polypectomy First Screening Colonoscopy - Avg.  risk and is 50 yrs.  old or older - No.  Prior Negative Screening - Now for repeat screening. N/A  History of Adenoma - Now for follow-up colonoscopy & has been > or = to 3 yrs.  Yes hx of adenoma.  Has been 3 or more years since last colonoscopy.  Polyps removed today? Yes ASA CLASS:   Class II INDICATIONS:Surveillance due to prior colonic neoplasia and Dr. Benson Norway colonoscopy 2011 found 2 subCM adenomas. MEDICATIONS: Monitored anesthesia care and Propofol 230 mg IV  DESCRIPTION OF PROCEDURE:   After the risks benefits and alternatives of the procedure were thoroughly explained, informed consent was obtained.  The digital rectal exam revealed no abnormalities of the rectum.   The LB [id number]-H190 [id number]  endoscope was introduced through the anus and advanced to the cecum, which was identified by both the appendix and ileocecal valve. No adverse events experienced.   The quality of the prep was excellent.  The instrument was then slowly withdrawn as the colon was fully examined. Estimated blood loss is zero unless otherwise noted in this procedure report.   COLON FINDINGS: Seven sessile polyps ranging between 3-101mm in size were found in the transverse colon and ascending colon. Polypectomies were performed with a cold snare.  The resection was complete, the polyp tissue was completely retrieved and sent to histology.   There was mild diverticulosis noted in the left colon. The examination was otherwise normal.  Retroflexed views revealed no abnormalities. The time to cecum = 3.3 Withdrawal time = 11.7 The scope was  withdrawn and the procedure completed. COMPLICATIONS: There were no immediate complications.  ENDOSCOPIC IMPRESSION: 1. Seven sessile polyps ranging between 3-38mm in size were found in the transverse colon and ascending colon; polypectomies were performed with a cold snare 2.   Mild diverticulosis was noted in the left colon 3.   The examination was otherwise normal  RECOMMENDATIONS: If the polyp(s) removed today are proven to be adenomatous (pre-cancerous) polyps, you will need a colonoscopy in 3 years. You will receive a letter within 1-2 weeks with the results of your biopsy as well as final recommendations.  Please call my office if you have not received a letter after 3 weeks.  eSigned:  Milus Banister, MD 03/25/2015 10:39 AM   cc: Madison Hickman, MD

## 2015-03-26 ENCOUNTER — Ambulatory Visit (INDEPENDENT_AMBULATORY_CARE_PROVIDER_SITE_OTHER): Payer: 59 | Admitting: Family Medicine

## 2015-03-26 ENCOUNTER — Ambulatory Visit (INDEPENDENT_AMBULATORY_CARE_PROVIDER_SITE_OTHER): Payer: 59

## 2015-03-26 VITALS — BP 135/90 | HR 88 | Temp 98.9°F | Resp 18 | Ht 65.5 in | Wt 294.2 lb

## 2015-03-26 DIAGNOSIS — D860 Sarcoidosis of lung: Secondary | ICD-10-CM

## 2015-03-26 DIAGNOSIS — R0989 Other specified symptoms and signs involving the circulatory and respiratory systems: Secondary | ICD-10-CM | POA: Diagnosis not present

## 2015-03-26 DIAGNOSIS — R059 Cough, unspecified: Secondary | ICD-10-CM

## 2015-03-26 DIAGNOSIS — R05 Cough: Secondary | ICD-10-CM

## 2015-03-26 MED ORDER — DOXYCYCLINE HYCLATE 100 MG PO CAPS: 100.0000 mg | ORAL_CAPSULE | Freq: Two times a day (BID) | ORAL | Status: DC

## 2015-03-26 MED ORDER — BENZONATATE 100 MG PO CAPS: 100.0000 mg | ORAL_CAPSULE | Freq: Three times a day (TID) | ORAL | Status: DC | PRN

## 2015-03-26 NOTE — Progress Notes (Signed)
[facility] [address] [place] [address] [phone]  Date:  03/26/2015   Name:  Rebecca Hicks   DOB:  03-07-1946   MRN:  [id number]  PCP:  Zigmund Gottron, MD    Chief Complaint: Cough; Sore Throat; Generalized Body Aches; and Chills   History of Present Illness:  Rebecca Hicks is a 69 y.o. very pleasant female patient who presents with the following:  Established pt with history of pulmonary sarcoidosis here today with illness- she thought that she had a cold starting just about a week ago.  Over the last 5 days she developed a ST, then a cough.  She was supposed to have an MRI a couple of days ago for her back but had to cancel due to cough.  She did manage to get her colonoscopy yesterday as she had scheduled  She is coughing up mucus which is thick and yellow.   She is taking OTC medications and is worried that she might be taking too much    She has not noted any fever.  She does not have sx in her sinuses at this time- just in her chest.    Her sarcoid is generally pretty quiet- she had a CT scan of her chest last month that was stable as below:    IMPRESSION: 1. No evidence of active pulmonary sarcoidosis or other interstitial lung disease. Stable left upper lobe granuloma. 2. Significant air trapping throughout both lungs, indicating small airways disease. 3. Stable mild mediastinal and left hilar lymphadenopathy. 4. Left main and 3 vessel coronary atherosclerosis. 5. Moderate diffuse hepatic steatosis  Patient Active Problem List   Diagnosis Date Noted  . Diarrhea 01/21/2015  . Chronic kidney disease (CKD), stage II (mild) 03/05/2013  . COLONIC POLYPS, ADENOMATOUS 05/03/2009  . GLAUCOMA NOS 02/25/2009  . Diabetic neuropathy, type II diabetes mellitus ([place]) 05/28/2008  . LOW BACK PAIN SYNDROME 05/28/2008  . 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 DM (diabetes mellitus), type 2, uncontrolled ([place]) 02/07/2007  .  Hyperlipidemia 02/07/2007  . Morbid obesity ([place]) 02/07/2007  . Essential hypertension 02/07/2007  . TB SKIN TEST, POSITIVE 02/07/2007  . TOBACCO USE, QUIT 02/07/2007  . HYSTERECTOMY, HX OF 02/07/2007    Past Medical History  Diagnosis Date  . Sarcoidosis ([place])   . Hypertension   . Diabetes mellitus   . Hyperlipidemia   . Morbid obesity ([place])   . Polyneuropathy in diabetes(357.2)   . Arthritis   . Cataract   . Glaucoma   . Neuromuscular disorder [facility])     Past Surgical History  Procedure Laterality Date  . Abdominal hysterectomy    . Spine surgery      lumbar laminectomy X 2  . Appendectomy    . Eye surgery    . Cataract extraction, bilateral  03/2010, 04/2010  . Tonsillectomy    . Colonoscopy  2011  . Polypectomy  2011    polyps, hems     Social History  Substance Use Topics  . Smoking status: Former Smoker -- 1.00 packs/day for 20 years    Types: Cigarettes    Quit date: 04/24/1995  . Smokeless tobacco: Never Used  . Alcohol Use: No    Family History  Problem Relation Age of Onset  . Hypertension      siblings  . Diabetes      siblings  . Heart disease Brother   . Cervical cancer Mother   . Stroke Father   .  Alcohol abuse Father   . Breast cancer Maternal Aunt   . Stroke Paternal Grandmother   . Alcohol abuse Brother   . Pancreatic cancer Maternal Uncle   . Colon cancer Neg Hx   . Esophageal cancer Neg Hx   . Rectal cancer Neg Hx   . Stomach cancer Neg Hx     Allergies  Allergen Reactions  . Penicillins Rash    Medication list has been reviewed and updated.  Current Outpatient Prescriptions on File Prior to Visit  Medication Sig Dispense Refill  . albuterol (PROVENTIL HFA;VENTOLIN HFA) 108 (90 BASE) MCG/ACT inhaler Inhale 2 puffs into the lungs every 6 (six) hours as needed for wheezing or shortness of breath. 1 Inhaler 6  . amLODipine (NORVASC) 10 MG tablet Take 1 tablet by mouth  daily 90 tablet 3  . aspirin 81 MG tablet Take 81 mg by  mouth daily.      . BD ULTRA-FINE PEN NEEDLES 29G X 12.7MM MISC Use two times daily 180 each 3  . cloNIDine (CATAPRES) 0.1 MG tablet Take 1 tablet (0.1 mg total) by mouth 3 (three) times daily. 270 tablet 3  . diphenoxylate-atropine (LOMOTIL) 2.5-0.025 MG per tablet Take 1 tablet by mouth 4 (four) times daily as needed for diarrhea or loose stools. 120 tablet 6  . DULoxetine (CYMBALTA) 30 MG capsule Take 1 capsule (30 mg total) by mouth daily. 90 capsule 3  . gabapentin (NEURONTIN) 300 MG capsule Take 1 capsule (300 mg total) by mouth 3 (three) times daily. (Patient taking differently: Take 300 mg by mouth 2 (two) times daily. ) 270 capsule 3  . Insulin Isophane & Regular Human (HUMULIN 70/30 KWIKPEN) (70-30) 100 UNIT/ML PEN Take 70 every am & 60 units before evening meal. 40 pen 3  . metFORMIN (GLUCOPHAGE) 1000 MG tablet Take 0.5 tablets (500 mg total) by mouth 2 (two) times daily. 180 tablet 3  . metoprolol (TOPROL-XL) 200 MG 24 hr tablet Take 1 tablet by mouth  daily 90 tablet 3  . nitroGLYCERIN (NITROSTAT) 0.4 MG SL tablet Place 1 tablet (0.4 mg total) under the tongue every 5 (five) minutes as needed for chest pain. If 3rd tablet needed - go to emergency room or call 911. 50 tablet 0  . ONE TOUCH ULTRA TEST test strip Test 3 times daily 100 each 12  . rosuvastatin (CRESTOR) 40 MG tablet Take 1 tablet (40 mg total) by mouth daily. (Patient taking differently: Take 40 mg by mouth daily. PT STATES SHE IS TAKING 3 TIMES PER WEEK) 90 tablet 3  . spironolactone (ALDACTONE) 25 MG tablet Take 1 tablet (25 mg total) by mouth daily. 90 tablet 3  . telmisartan-hydrochlorothiazide (MICARDIS HCT) 80-12.5 MG per tablet Take 1 tablet by mouth  daily 90 tablet 3  . timolol (TIMOPTIC) 0.25 % ophthalmic solution 2 (two) times daily. Per optho     . traMADol (ULTRAM) 50 MG tablet Take 1 tablet (50 mg total) by mouth every 8 (eight) hours as needed. 90 tablet 5   No current facility-administered medications on  file prior to visit.    Review of Systems:  As per HPI- otherwise negative.   Physical Examination: Filed Vitals:   03/26/15 1533  BP: 159/79  Pulse: 88  Temp: 98.9 F (37.2 C)  Resp: 18   Filed Vitals:   03/26/15 1533  Height: 5' 5.5" (1.664 m)  Weight: 294 lb 3.2 oz (133.448 kg)   Body mass index is 48.2 kg/(m^2). Ideal Body  Weight: Weight in (lb) to have BMI = 25: 152.2  GEN: WDWN, NAD, Non-toxic, A & O x 3, obese, coughing a lot in room but OW looks well HEENT: Atraumatic, Normocephalic. Neck supple. No masses, No LAD.  Bilateral TM wnl, oropharynx normal.  PEERL,EOMI.   Ears and Nose: No external deformity. CV: RRR, No M/G/R. No JVD. No thrill. No extra heart sounds. PULM: mild wheezing and ronchi bilaterally  No retractions. No resp. distress. No accessory muscle use. EXTR: No c/c/e NEURO Normal gait.  PSYCH: Normally interactive. Conversant. Not depressed or anxious appearing.  Calm demeanor.   [facility] reading (PRIMARY) by  Dr. Lorelei Pont CXR- no change from previous.  Borderline heart size and increase pericardial markings   CHEST 2 VIEW COMPARISON: CT 03/01/2015. Radiographs 04/18/2014.  FINDINGS: There is mild breathing artifact on the lateral view. The heart size and mediastinal contours are stable. There is aortic atherosclerosis. The lungs are clear. No pleural effusion or pneumothorax. The bones appear unchanged.  IMPRESSION: Radiographically stable appearance of the chest. No active cardiopulmonary process.    Assessment and Plan: Cough - Plan: DG Chest 2 View, doxycycline (VIBRAMYCIN) 100 MG capsule, benzonatate (TESSALON) 100 MG capsule  Chest congestion - Plan: DG Chest 2 View, doxycycline (VIBRAMYCIN) 100 MG capsule  Pulmonary sarcoidosis ([place]) - Plan: DG Chest 2 View  Here today with cough and chest congestion.  Wheezing and ronchi on exam- some of which may be baseline, O2 sat is at her baseline Continue albuterol as needed Treat with  doxycycline, tessalon for cough Follow-up if not better soon  Signed Lamar Blinks, MD

## 2015-03-26 NOTE — Patient Instructions (Signed)
Use the doxycycline antibiotic for your lung infection You can also use the tessalon perles as needed for cough Let us know if you are not feeling better in the next few days- Sooner if worse.

## 2015-03-28 ENCOUNTER — Telehealth: Payer: Self-pay

## 2015-03-28 NOTE — Telephone Encounter (Signed)
  Follow up Call-  Call back number 03/25/2015  Post procedure Call Back phone  # [phone]  Permission to leave phone message Yes     Patient questions:  Do you have a fever, pain , or abdominal swelling? No. Pain Score  0 *  Have you tolerated food without any problems? Yes.    Have you been able to return to your normal activities? Yes.    Do you have any questions about your discharge instructions: Diet   No. Medications  No. Follow up visit  No.  Do you have questions or concerns about your Care? No.  Actions: * If pain score is 4 or above: No action needed, pain <4.

## 2015-04-01 ENCOUNTER — Encounter: Payer: Self-pay | Admitting: Gastroenterology

## 2015-04-02 ENCOUNTER — Ambulatory Visit
Admission: RE | Admit: 2015-04-02 | Discharge: 2015-04-02 | Disposition: A | Discharge status: Home / Self Care | Payer: 59 | Source: Ambulatory Visit | Attending: Orthopedic Surgery | Admitting: Orthopedic Surgery

## 2015-04-02 DIAGNOSIS — M545 Low back pain: Secondary | ICD-10-CM

## 2015-04-07 ENCOUNTER — Encounter: Payer: Self-pay | Admitting: Pharmacist

## 2015-04-07 ENCOUNTER — Ambulatory Visit (INDEPENDENT_AMBULATORY_CARE_PROVIDER_SITE_OTHER): Payer: 59 | Admitting: Pharmacist

## 2015-04-07 VITALS — BP 165/76 | HR 64

## 2015-04-07 DIAGNOSIS — I10 Essential (primary) hypertension: Secondary | ICD-10-CM | POA: Diagnosis not present

## 2015-04-07 NOTE — Patient Instructions (Addendum)
Your blood pressure evaluation provided [place] a large amount of information.     I would like you to realize that your blood pressure likely responds to some extent to stress and pressure.   Continue to take your amlodipine daily, however I would like to have you take this daily in the evening/at bedtime.   Continue to take your spironolactone daily.   Continue to take your telmisartan/HCTZ (Micardis)   Continue to take your clonidine twice daily.   Reduce your dose of metoprolol 200XL by taking 1/2 tablets once daily.   F/U with Dr. Andria Frames.

## 2015-04-07 NOTE — Progress Notes (Signed)
S:    Patient arrives in good spirits, ambulating without assistance.    Presents to the clinic for ambulatory blood pressure evaluation.  Diagnosed with Hypertension "many years ago".   Patient reports blood pressure elevations likely related to stress.    Medication compliance is reported to be good.  Discussed procedure for wearing the monitor and gave patient written instructions. Monitor was placed on non-dominant arm with instructions to return in the morning.   Current BP Medications include:  Amlodipine 10mg  daily, clonidine 0.1mg  TID (patient reports taking BID), metoprolol succinate 200mg  daily, spironolactone 25mg  daily, and telmisartan-hydrochlorothiazide 80-12.5mg  daily   Antihypertensives tried in the past include:   Patient returned to the clinic and reported no major issues with BP monitor.  She stated that she tried to work quietly and had a less than average stress day during her BP  Evaluation.    O:   Last 3 Office BP readings: BP Readings from Last 3 Encounters:  03/26/15 135/90  03/25/15 100/53  03/16/15 158/63     Today's Office BP reading: 165/76 mmHg (manual reading)  ABPM Study Data: Arm Placement left arm   Overall Mean 24hr BP:   116/55 mmHg HR: 59   Daytime Mean BP:  119/57 mmHg HR: 60   Nighttime Mean BP:  109/51 mmHg HR: 58   Dipping Pattern: Borderline :  Sys:   9.1%   Dia: 11.3%   [normal dipping ~10-20%]   Non-hypertensive ABPM thresholds: daytime BP <135/85 mmHg, sleeptime BP <120/70 mmHg NICE Hypertension Guidelines ([place]) using ABPM: Stage I: >135/85 mmHg, Stage 2: >150/95 mmHg)   BMET    Component Value Date/Time   NA 137 01/20/2015 1003   K 4.3 01/20/2015 1003   CL 102 01/20/2015 1003   CO2 23 01/20/2015 1003   GLUCOSE 151* 01/20/2015 1003   BUN 31* 01/20/2015 1003   CREATININE 1.09* 01/20/2015 1003   CREATININE 1.27* 08/23/2014 0934   CALCIUM 9.5 01/20/2015 1003   GFRNONAA 38* 12/24/2012 1624   GFRAA 43* 12/24/2012 1624     A/P: History of hypertension for many years found to have significant white coat hypertension (BP at goal)  Given 24-hour ambulatory blood pressure results.  The average blood pressure of 116/55  mmHg, and a nocturnal dipping pattern that is borderline at ~10%.  Changes to medications  Include shift of amlodpine to QPM dosing and reduction of metoprolol XL from 200mg  to 100mg  daily. Results reviewed and written information provided.  Total time in face-to-face counseling 35 minutes.  Reassess control and symptoms of feeling unsteady on her feet at next F/U Clinic Visit with Dr. Andria Frames.

## 2015-04-08 NOTE — Progress Notes (Signed)
Patient ID: Rebecca Hicks, female   DOB: [birth year], 69 y.o.   MRN: [id number] Reviewed.  Agree with Dr. Graylin Shiver documentation and management.

## 2015-04-08 NOTE — Assessment & Plan Note (Signed)
History of hypertension for many years found to have significant white coat hypertension (BP at goal)  Given 24-hour ambulatory blood pressure results.  The average blood pressure of 116/55  mmHg, and a nocturnal dipping pattern that is borderline at ~10%.  Changes to medications  Include shift of amlodpine to QPM dosing and reduction of metoprolol XL from 200mg  to 100mg  daily. Results reviewed and written information provided.  Total time in face-to-face counseling 35 minutes.  Reassess control and symptoms of feeling unsteady on her feet at next F/U Clinic Visit with Dr. Andria Frames.

## 2015-04-15 ENCOUNTER — Ambulatory Visit (INDEPENDENT_AMBULATORY_CARE_PROVIDER_SITE_OTHER): Payer: 59 | Admitting: Family Medicine

## 2015-04-15 VITALS — BP 136/84 | HR 88 | Temp 97.7°F | Resp 16 | Ht 65.5 in

## 2015-04-15 DIAGNOSIS — R062 Wheezing: Secondary | ICD-10-CM

## 2015-04-15 DIAGNOSIS — D869 Sarcoidosis, unspecified: Secondary | ICD-10-CM | POA: Diagnosis not present

## 2015-04-15 DIAGNOSIS — R059 Cough, unspecified: Secondary | ICD-10-CM

## 2015-04-15 DIAGNOSIS — R05 Cough: Secondary | ICD-10-CM

## 2015-04-15 MED ORDER — AZITHROMYCIN 250 MG PO TABS: ORAL_TABLET | ORAL | Status: DC

## 2015-04-15 MED ORDER — PREDNISONE 20 MG PO TABS: ORAL_TABLET | ORAL | Status: DC

## 2015-04-15 NOTE — Progress Notes (Signed)
By signing my name below, I, Moises Blood, attest that this documentation has been prepared under the direction and in the presence of Robyn Haber, MD. Electronically Signed: Moises Blood, [place]. 04/15/2015 , 2:19 PM .  Patient was seen in room 11 .   Patient ID: Rebecca Hicks MRN: [id number], DOB: [birth year], 68 y.o. Date of Encounter: 04/15/2015  Primary Physician: Zigmund Gottron, MD  Chief Complaint:  Chief Complaint  Patient presents with  . Follow-up    sore throat, cough, congestion    HPI:  Rebecca Hicks is a 69 y.o. female who presents to [facility] for follow up on sore throat, cough and congestion. She was seen here 10 days ago. She has been taking doxycycline. She notes that the cough persists but isn't productive anymore.   She hasn't been taking her other medications since the current illness started.  She's also been eating inappropriately because of the holidays  She works for city of [employer].   Past Medical History  Diagnosis Date  . Sarcoidosis ([place])   . Hypertension   . Diabetes mellitus   . Hyperlipidemia   . Morbid obesity ([place])   . Polyneuropathy in diabetes(357.2)   . Arthritis   . Cataract   . Glaucoma   . Neuromuscular disorder ([place])      Home Meds: Prior to Admission medications   Medication Sig Start Date End Date Taking? Authorizing Provider  albuterol (PROVENTIL HFA;VENTOLIN HFA) 108 (90 BASE) MCG/ACT inhaler Inhale 2 puffs into the lungs every 6 (six) hours as needed for wheezing or shortness of breath. 02/15/15  Yes Collene Gobble, MD  amLODipine (NORVASC) 10 MG tablet Take 1 tablet (10 mg total) by mouth every evening. 04/08/15  Yes Zenia Resides, MD  aspirin 81 MG tablet Take 81 mg by mouth daily.     Yes Historical Provider, MD  BD ULTRA-FINE PEN NEEDLES 29G X 12.7MM MISC Use two times daily 01/18/14  Yes Zenia Resides, MD  cloNIDine (CATAPRES) 0.1 MG tablet Take 1 tablet (0.1 mg  total) by mouth 3 (three) times daily. Patient taking differently: Take 0.1 mg by mouth 2 (two) times daily.  01/20/15  Yes Zenia Resides, MD  diphenoxylate-atropine (LOMOTIL) 2.5-0.025 MG per tablet Take 1 tablet by mouth 4 (four) times daily as needed for diarrhea or loose stools. 10/30/13  Yes Zenia Resides, MD  DULoxetine (CYMBALTA) 30 MG capsule Take 1 capsule (30 mg total) by mouth daily. 01/20/15  Yes Zenia Resides, MD  Insulin Isophane & Regular Human (HUMULIN 70/30 KWIKPEN) (70-30) 100 UNIT/ML PEN Take 70 every am & 60 units before evening meal. Patient taking differently: Inject 60 Units into the skin 2 (two) times daily. Take 70 every am & 60 units before evening meal. 01/20/15  Yes Zenia Resides, MD  metFORMIN (GLUCOPHAGE) 1000 MG tablet Take 0.5 tablets (500 mg total) by mouth 2 (two) times daily. 01/20/15  Yes Zenia Resides, MD  metoprolol (TOPROL-XL) 200 MG 24 hr tablet Take 0.5 tablets (100 mg total) by mouth daily. 04/08/15  Yes Zenia Resides, MD  nitroGLYCERIN (NITROSTAT) 0.4 MG SL tablet Place 1 tablet (0.4 mg total) under the tongue every 5 (five) minutes as needed for chest pain. If 3rd tablet needed - go to emergency room or call 911. 02/17/14  Yes Wendie Agreste, MD  ONE [facility] ULTRA TEST test strip Test 3 times daily 12/06/14  Yes Zenia Resides, MD  rosuvastatin (CRESTOR) 10 MG tablet Take 10 mg by mouth 3 (three) times a week.   Yes Historical Provider, MD  spironolactone (ALDACTONE) 25 MG tablet Take 1 tablet (25 mg total) by mouth daily. 01/20/15  Yes Zenia Resides, MD  telmisartan-hydrochlorothiazide (MICARDIS HCT) 80-12.5 MG per tablet Take 1 tablet by mouth  daily 05/21/14  Yes Zenia Resides, MD  timolol (TIMOPTIC) 0.25 % ophthalmic solution 2 (two) times daily. Per optho    Yes Historical Provider, MD  benzonatate (TESSALON) 100 MG capsule Take 1 capsule (100 mg total) by mouth 3 (three) times daily as needed for cough. Patient not taking: Reported on  04/07/2015 03/26/15   Darreld Mclean, MD  traMADol (ULTRAM) 50 MG tablet Take 1 tablet (50 mg total) by mouth every 8 (eight) hours as needed. Patient not taking: Reported on 04/07/2015 05/18/14   Zenia Resides, MD    Allergies:  Allergies  Allergen Reactions  . Penicillins Rash    Social History   Social History  . Marital Status: Single    Spouse Name: N/A  . Number of Children: 0  . Years of Education: college   Occupational History  . Manager Unemployed   Social History Main Topics  . Smoking status: Former Smoker -- 1.00 packs/day for 20 years    Types: Cigarettes    Quit date: 04/24/1995  . Smokeless tobacco: Never Used  . Alcohol Use: No  . Drug Use: No  . Sexual Activity: Not on file   Other Topics Concern  . Not on file   Social History Narrative     Review of Systems: Constitutional: negative for fever, chills, night sweats, weight changes, or fatigue  HEENT: negative for vision changes, hearing loss, rhinorrhea, epistaxis, or sinus pressure; positive for sore throat, congestion Cardiovascular: negative for chest pain or palpitations Respiratory: negative for hemoptysis, wheezing, shortness of breath; positive for cough Abdominal: negative for abdominal pain, nausea, vomiting, diarrhea, or constipation Dermatological: negative for rash Neurologic: negative for headache, dizziness, or syncope All other systems reviewed and are otherwise negative with the exception to those above and in the HPI.  Physical Exam: Blood pressure 136/84, pulse 88, temperature 97.7 F (36.5 C), temperature source Oral, resp. rate 16, height 5' 5.5" (1.664 m), SpO2 98 %., There is no weight on file to calculate BMI. General: Well developed, well nourished, in no acute distress. Head: Normocephalic, atraumatic, eyes without discharge, sclera non-icteric, nares are without discharge. Bilateral auditory canals clear, TM's are without perforation, pearly grey and translucent with  reflective cone of light bilaterally. Oral cavity moist, posterior pharynx without exudate, erythema, peritonsillar abscess, or post nasal drip.  Neck: Supple. No thyromegaly. Full ROM. No lymphadenopathy. Lungs: Clear bilaterally to auscultation without rales, or rhonchi. Breathing is unlabored. Bilateral wheezing Heart: RRR with S1 S2. No murmurs, rubs, or gallops appreciated. Msk:  Strength and tone normal for age. Extremities/Skin: Warm and dry. No clubbing or cyanosis. No edema. No rashes or suspicious lesions. Neuro: Alert and oriented X 3. Moves all extremities spontaneously. Gait is normal. CNII-XII grossly in tact. Psych:  Responds to questions appropriately with a normal affect.   Labs: Results for orders placed or performed in visit on 03/25/15  Glucose, capillary  Result Value Ref Range   Glucose-Capillary 237 (H) 65 - 99 mg/dL  Glucose, capillary  Result Value Ref Range   Glucose-Capillary 240 (H) 65 - 99 mg/dL     ASSESSMENT AND PLAN:  69 y.o. year old  female withpersistent cough for weeks  This chart was scribed in my presence and reviewed by me personally.    ICD-9-CM ICD-10-CM   1. Cough 786.2 R05 predniSONE (DELTASONE) 20 MG tablet     azithromycin (ZITHROMAX) 250 MG tablet  2. Wheezing 786.07 R06.2 predniSONE (DELTASONE) 20 MG tablet     azithromycin (ZITHROMAX) 250 MG tablet  3. Sarcoidosis ([place]) 135 D86.9 predniSONE (DELTASONE) 20 MG tablet     azithromycin (ZITHROMAX) 250 MG tablet     patient told she must be more observant about her insulin and diet.  Signed, Robyn Haber, MD 04/15/2015 2:19 PM

## 2015-05-08 ENCOUNTER — Other Ambulatory Visit: Payer: Self-pay | Admitting: Family Medicine

## 2015-06-20 LAB — HM DIABETES EYE EXAM

## 2015-07-01 ENCOUNTER — Other Ambulatory Visit: Payer: Self-pay | Admitting: Family Medicine

## 2015-09-05 ENCOUNTER — Ambulatory Visit (INDEPENDENT_AMBULATORY_CARE_PROVIDER_SITE_OTHER): Payer: 59 | Admitting: Emergency Medicine

## 2015-09-05 ENCOUNTER — Encounter: Payer: Self-pay | Admitting: Emergency Medicine

## 2015-09-05 VITALS — BP 150/86 | HR 82 | Ht 66.0 in | Wt 305.0 lb

## 2015-09-05 DIAGNOSIS — D869 Sarcoidosis, unspecified: Secondary | ICD-10-CM | POA: Diagnosis not present

## 2015-09-05 DIAGNOSIS — R0683 Snoring: Secondary | ICD-10-CM | POA: Insufficient documentation

## 2015-09-05 DIAGNOSIS — R0602 Shortness of breath: Secondary | ICD-10-CM

## 2015-09-05 HISTORY — DX: Snoring: R06.83

## 2015-09-05 MED ORDER — ALBUTEROL SULFATE HFA 108 (90 BASE) MCG/ACT IN AERS: 2.0000 | INHALATION_SPRAY | Freq: Four times a day (QID) | RESPIRATORY_TRACT | Status: DC | PRN

## 2015-09-05 NOTE — Progress Notes (Signed)
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  She describes some rash that has affected her face and knees over the last few months. She has had some joint injections of cortisone. Has also use topical cortisone. The rash is red and bumpy. She has also had some progressive dyspnea over this time. No wheeze. Not on BD's.   Acute OV 09/05/15 -- this is an acute visit for patient with a history of sarcoidosis. Showed a normal spirometry in October 2016. Most recent CT scan of the chest was done on 03/01/15 that I personally reviewed. There was adenopathy but no evidence of active sarcoid, there was a stable left upper lobe granuloma. There was no interstitial lung disease. There was significant bilateral air trapping consistent with possible obstruction. She reports today that her exercise ability has decreased over the last 2 months- - she isn'tt sure whether this is deconditioning from decreased exercise. She also reports that she has URI sx and cough all through the winter months, was treated with abx x 3, received pred x 1. Her family has noted that she has to stop more frequently to rest. She is having some allergic drainage, minimal cough now.  No bouts of rash currently, but she did have some during the winter months. She is a heavy snorer and is ready now to get her polysomnogram which we had canceled before   Exam:  Filed Vitals:   09/05/15 1112  BP: 150/86  Pulse: 82  Height: 5\' 6"  (1.676 m)  Weight: 305 lb (138.347 kg)  SpO2: 98%    Gen: Pleasant, obese, in no distress,  normal affect, obese   ENT: No lesions,  mouth clear,  oropharynx clear, no postnasal drip  Neck: No JVD, no TMG, no carotid bruits  Lungs: No use of accessory muscles, no dullness to percussion, coarse BS w/ no wheezing   Cardiovascular: RRR, heart sounds normal, no murmur or gallops, no peripheral  edema  Musculoskeletal: No deformities, no cyanosis or clubbing  Neuro: alert, non focal  Skin: Warm,  LE and knees without rash.    03/01/15 --  COMPARISON: 01/26/2011 chest CT.  FINDINGS: Mediastinum/Nodes: Normal heart size. No pericardial fluid/thickening. There is atherosclerosis of the thoracic aorta, the great vessels of the mediastinum and the coronary arteries, including calcified atherosclerotic plaque in the left main, left anterior descending, left circumflex and right coronary arteries. Great vessels are normal in course and caliber. Normal visualized thyroid. Normal esophagus. No axillary adenopathy. Stable mildly enlarged 1.0 cm right lower paratracheal node (series 2/ image 19). Stable coarse calcifications within a aortopulmonary window and left hilar nodes, in keeping with sarcoidosis. No new mediastinal or appreciable hilar adenopathy.  Lungs/Pleura: No pneumothorax. No pleural effusion. Stable subcentimeter calcified granuloma in the subpleural left upper lobe. No acute consolidative airspace disease, new significant pulmonary nodules or lung masses. No significant perilymphatic distribution nodularity. No emphysema. No significant regions of ground-glass attenuation, subpleural reticulation, traction bronchiectasis, parenchymal banding, architectural distortion or frank honeycombing. There is significant air trapping throughout both lungs on the expiration sequence.  Upper abdomen: Moderate diffuse hepatic steatosis.  Musculoskeletal: No aggressive appearing focal osseous lesions. Moderate degenerative changes in the thoracic spine.  IMPRESSION: 1. No evidence of active pulmonary sarcoidosis or other interstitial lung disease. Stable left upper lobe granuloma. 2. Significant air trapping throughout both lungs, indicating small airways disease. 3. Stable mild mediastinal and left  hilar lymphadenopathy. 4. Left main and 3 vessel coronary  atherosclerosis. 5. Moderate diffuse hepatic steatosis.    SARCOIDOSIS, PULMONARY Increased dyspnea but etiology unclear. Certainly this could be a result of active sarcoidosis. I believe that her difficulty during the winter months probably was related to her sarcoid given her rash and failure to resolve until she received prednisone. We will repeat her spirometry, compare with last visit in order to decide whether to start her on laba/ICS  Snoring She is now ready to undergo a sleep study. We will order a split night study. I suspect that she does have sleep apnea. I'll see her back in 2 months to assess status after hopefully starting CPAP   Baltazar Apo, MD, PhD 09/05/2015, 11:42 AM [facility] [phone] or if no answer [phone]

## 2015-09-05 NOTE — Addendum Note (Signed)
Addended by: Desmond Dike C on: 09/05/2015 11:49 AM   Modules accepted: Orders

## 2015-09-05 NOTE — Assessment & Plan Note (Signed)
Increased dyspnea but etiology unclear. Certainly this could be a result of active sarcoidosis. I believe that her difficulty during the winter months probably was related to her sarcoid given her rash and failure to resolve until she received prednisone. We will repeat her spirometry, compare with last visit in order to decide whether to start her on laba/ICS

## 2015-09-05 NOTE — Assessment & Plan Note (Signed)
She is now ready to undergo a sleep study. We will order a split night study. I suspect that she does have sleep apnea. I'll see her back in 2 months to assess status after hopefully starting CPAP

## 2015-09-05 NOTE — Patient Instructions (Signed)
We will arrange for a sleep study We will repeat your spirometry today. Depending on result may decide to try starting an everyday inhaler to see if you benefit Please keep albuterol available to use 2 puffs if needed for shortness of breath. Follow with Dr Lamonte Sakai in 2 months or sooner if you have any problems.

## 2015-10-01 ENCOUNTER — Emergency Department (HOSPITAL_COMMUNITY)
Admission: EM | Admit: 2015-10-01 | Discharge: 2015-10-01 | Disposition: A | Discharge status: Home / Self Care | Payer: 59 | Attending: Emergency Medicine | Admitting: Emergency Medicine

## 2015-10-01 ENCOUNTER — Emergency Department (HOSPITAL_COMMUNITY): Payer: 59

## 2015-10-01 DIAGNOSIS — Z7982 Long term (current) use of aspirin: Secondary | ICD-10-CM | POA: Diagnosis not present

## 2015-10-01 DIAGNOSIS — Z87891 Personal history of nicotine dependence: Secondary | ICD-10-CM | POA: Diagnosis not present

## 2015-10-01 DIAGNOSIS — E1140 Type 2 diabetes mellitus with diabetic neuropathy, unspecified: Secondary | ICD-10-CM | POA: Insufficient documentation

## 2015-10-01 DIAGNOSIS — I10 Essential (primary) hypertension: Secondary | ICD-10-CM | POA: Insufficient documentation

## 2015-10-01 DIAGNOSIS — E785 Hyperlipidemia, unspecified: Secondary | ICD-10-CM | POA: Diagnosis not present

## 2015-10-01 DIAGNOSIS — W228XXA Striking against or struck by other objects, initial encounter: Secondary | ICD-10-CM | POA: Insufficient documentation

## 2015-10-01 DIAGNOSIS — Y92009 Unspecified place in unspecified non-institutional (private) residence as the place of occurrence of the external cause: Secondary | ICD-10-CM | POA: Diagnosis not present

## 2015-10-01 DIAGNOSIS — S0512XA Contusion of eyeball and orbital tissues, left eye, initial encounter: Secondary | ICD-10-CM | POA: Insufficient documentation

## 2015-10-01 DIAGNOSIS — Y999 Unspecified external cause status: Secondary | ICD-10-CM | POA: Diagnosis not present

## 2015-10-01 DIAGNOSIS — S0511XA Contusion of eyeball and orbital tissues, right eye, initial encounter: Secondary | ICD-10-CM | POA: Insufficient documentation

## 2015-10-01 DIAGNOSIS — Z794 Long term (current) use of insulin: Secondary | ICD-10-CM | POA: Diagnosis not present

## 2015-10-01 DIAGNOSIS — Z7984 Long term (current) use of oral hypoglycemic drugs: Secondary | ICD-10-CM | POA: Insufficient documentation

## 2015-10-01 DIAGNOSIS — Y939 Activity, unspecified: Secondary | ICD-10-CM | POA: Insufficient documentation

## 2015-10-01 DIAGNOSIS — W19XXXA Unspecified fall, initial encounter: Secondary | ICD-10-CM

## 2015-10-01 DIAGNOSIS — M1990 Unspecified osteoarthritis, unspecified site: Secondary | ICD-10-CM | POA: Diagnosis not present

## 2015-10-01 DIAGNOSIS — S0990XA Unspecified injury of head, initial encounter: Secondary | ICD-10-CM | POA: Diagnosis present

## 2015-10-01 MED ORDER — NAPROXEN 250 MG PO TABS
375.0000 mg | ORAL_TABLET | Freq: Once | ORAL | Status: AC
  Administered 2015-10-01: 375 mg via ORAL
  Filled 2015-10-01: qty 2

## 2015-10-01 NOTE — ED Provider Notes (Signed)
CSN: [id number]     Arrival date & time 10/01/15  1444 History   First MD Initiated Contact with Patient 10/01/15 1548     Chief Complaint  Patient presents with  . Fall   HPI Pt reports falling on Thursday. No loc, hit head on cabinets. Initially had knot on her left forehead and swelling to left eye. Reports now swelling has also spread to right eye.  No visual changes but difficult to open left eye now Left eye initially only one swollen, but now right one is getting swollen so she became concerned Not on any blood thinners except ASA 81mg  No confusion Did hit her knees during fall, bilaterally No weakness or numbness Can ambulate since (with a cane at baseline) Taking tylenol and ice for pain, some help  Past Medical History  Diagnosis Date  . Sarcoidosis ([place])   . Hypertension   . Diabetes mellitus   . Hyperlipidemia   . Morbid obesity ([place])   . Polyneuropathy in diabetes(357.2)   . Arthritis   . Cataract   . Glaucoma   . Neuromuscular disorder [facility])    Past Surgical History  Procedure Laterality Date  . Abdominal hysterectomy    . Spine surgery      lumbar laminectomy X 2  . Appendectomy    . Eye surgery    . Cataract extraction, bilateral  03/2010, 04/2010  . Tonsillectomy    . Colonoscopy  2011  . Polypectomy  2011    polyps, hems    Family History  Problem Relation Age of Onset  . Hypertension      siblings  . Diabetes      siblings  . Heart disease Brother   . Cervical cancer Mother   . Stroke Father   . Alcohol abuse Father   . Breast cancer Maternal Aunt   . Stroke Paternal Grandmother   . Alcohol abuse Brother   . Pancreatic cancer Maternal Uncle   . Colon cancer Neg Hx   . Esophageal cancer Neg Hx   . Rectal cancer Neg Hx   . Stomach cancer Neg Hx    Social History  Substance Use Topics  . Smoking status: Former Smoker -- 1.00 packs/day for 20 years    Types: Cigarettes    Quit date: 04/24/1995  . Smokeless tobacco: Never Used  .  Alcohol Use: No   OB History    No data available     Review of Systems  Constitutional: Negative for fever.  HENT: Negative for dental problem, drooling, ear discharge and ear pain.   Eyes: Negative for photophobia, pain, discharge, redness, itching and visual disturbance.  Allergic/Immunologic: Negative for immunocompromised state.  All other systems reviewed and are negative.     Allergies  Penicillins  Home Medications   Prior to Admission medications   Medication Sig Start Date End Date Taking? Authorizing Provider  albuterol (PROVENTIL HFA;VENTOLIN HFA) 108 (90 Base) MCG/ACT inhaler Inhale 2 puffs into the lungs every 6 (six) hours as needed for wheezing or shortness of breath. 09/05/15   Collene Gobble, MD  amLODipine (NORVASC) 10 MG tablet Take 1 tablet (10 mg total) by mouth every evening. 04/08/15   Zenia Resides, MD  aspirin 81 MG tablet Take 81 mg by mouth daily.      Historical Provider, MD  BD ULTRA-FINE PEN NEEDLES 29G X 12.7MM MISC Use two times daily 05/09/15   Zenia Resides, MD  cloNIDine (CATAPRES) 0.1 MG tablet Take  1 tablet (0.1 mg total) by mouth 3 (three) times daily. Patient taking differently: Take 0.1 mg by mouth 2 (two) times daily.  01/20/15   Zenia Resides, MD  diphenoxylate-atropine (LOMOTIL) 2.5-0.025 MG per tablet Take 1 tablet by mouth 4 (four) times daily as needed for diarrhea or loose stools. 10/30/13   Zenia Resides, MD  DULoxetine (CYMBALTA) 30 MG capsule Take 1 capsule (30 mg total) by mouth daily. 01/20/15   Zenia Resides, MD  Insulin Isophane & Regular Human (HUMULIN 70/30 KWIKPEN) (70-30) 100 UNIT/ML PEN Take 70 every am & 60 units before evening meal. Patient taking differently: Inject 60 Units into the skin 2 (two) times daily. Take 70 every am & 60 units before evening meal. 01/20/15   Zenia Resides, MD  metFORMIN (GLUCOPHAGE) 1000 MG tablet Take 0.5 tablets (500 mg total) by mouth 2 (two) times daily. 01/20/15   Zenia Resides, MD  metoprolol (TOPROL-XL) 200 MG 24 hr tablet Take 0.5 tablets (100 mg total) by mouth daily. 04/08/15   Zenia Resides, MD  nitroGLYCERIN (NITROSTAT) 0.4 MG SL tablet Place 1 tablet (0.4 mg total) under the tongue every 5 (five) minutes as needed for chest pain. If 3rd tablet needed - go to emergency room or call 911. 02/17/14   Wendie Agreste, MD  ONE TOUCH ULTRA TEST test strip Test 3 times daily 12/06/14   Zenia Resides, MD  rosuvastatin (CRESTOR) 10 MG tablet Take 10 mg by mouth 3 (three) times a week.    Historical Provider, MD  spironolactone (ALDACTONE) 25 MG tablet Take 1 tablet (25 mg total) by mouth daily. 01/20/15   Zenia Resides, MD  telmisartan-hydrochlorothiazide (MICARDIS HCT) 80-12.5 MG tablet Take 1 tablet by mouth  daily 07/01/15   Dickie La, MD  timolol (TIMOPTIC) 0.25 % ophthalmic solution 2 (two) times daily. Per optho     Historical Provider, MD  traMADol (ULTRAM) 50 MG tablet Take 1 tablet (50 mg total) by mouth every 8 (eight) hours as needed. 05/18/14   Zenia Resides, MD   BP 157/80 mmHg  Pulse 75  Temp(Src) 98.4 F (36.9 C) (Oral)  Resp 18  SpO2 97% Physical Exam  Constitutional: She is oriented to person, place, and time. She appears well-developed and well-nourished. No distress.  HENT:  Head: Normocephalic.  Right Ear: External ear normal.  Left Ear: External ear normal.  Bilateral swelling of eyes, left worse than right. Patient only able to open left eye minimally secondary to swelling, patient is able to open right eye on her own. Bilateral eccymosis of eyes. Swelling above the left eye with no crepitus.  Eyes: Conjunctivae and EOM are normal. Pupils are equal, round, and reactive to light. Right eye exhibits no discharge. Left eye exhibits no discharge. No scleral icterus.  No pain with extraocular movements  Neck: Normal range of motion. Neck supple.  Cardiovascular: Normal rate, regular rhythm, normal heart sounds and intact distal  pulses.   Pulmonary/Chest: Effort normal and breath sounds normal. No respiratory distress. She exhibits no tenderness.  Abdominal: Soft. Bowel sounds are normal. She exhibits no distension and no mass. There is no tenderness. There is no rebound and no guarding.  Musculoskeletal: Normal range of motion. She exhibits tenderness (Musca skeletal area is examined including C-spine, T-spine, L-spine, clavicles, chest, thorax, shoulders elbows, wrists, pelvis, and lower extremities. Patient is only tender at the knees bilaterally). She exhibits no edema.  Neurological: She is  alert and oriented to person, place, and time.  5 out of 5 strength all extremities, normal sensation at all extremities  Skin: Skin is warm. No rash noted.  Psychiatric: She has a normal mood and affect.  Nursing note and vitals reviewed.    ED Course  Procedures (including critical care time) Labs Review Labs Reviewed - No data to display  Imaging Review Dg Knee 2 Views Left  10/01/2015  CLINICAL DATA:  Pt had a fall at home on Thursday. Pt states she is having bilateral knee pain where both knees hit the ground. Pt states her right knee hurts worse than the left. EXAM: LEFT KNEE - 1-2 VIEW COMPARISON:  None. FINDINGS: Moderate arthritic change. No fracture or dislocation. Moderate joint effusion. IMPRESSION: Arthritis and joint effusion with no fractures Electronically Signed   By: Skipper Cliche M.D.   On: 10/01/2015 17:40   Dg Knee 2 Views Right  10/01/2015  CLINICAL DATA:  [place] at home on Thursday, bilateral knee pain EXAM: RIGHT KNEE - 1-2 VIEW COMPARISON:  None. FINDINGS: Two views of the right knee submitted. No acute fracture or subluxation. There is diffuse narrowing of joint space. Mild chondrocalcinosis. There is spurring of medial and lateral tibial plateau. Significant narrowing of patellofemoral joint space. Mild spurring of patella. Mild prepatellar soft tissue swelling. Small joint effusion. IMPRESSION: No  acute fracture or subluxation. Osteoarthritic changes as described above. Mild prepatellar soft tissue swelling. Small joint effusion. Mild chondrocalcinosis. Electronically Signed   By: Lahoma Crocker M.D.   On: 10/01/2015 17:40   Ct Head Wo Contrast  10/01/2015  CLINICAL DATA:  Fall 2 days prior with left orbital/frontal injury. EXAM: CT HEAD WITHOUT CONTRAST CT MAXILLOFACIAL WITHOUT CONTRAST TECHNIQUE: Multidetector CT imaging of the head and maxillofacial structures were performed using the standard protocol without intravenous contrast. Multiplanar CT image reconstructions of the maxillofacial structures were also generated. COMPARISON:  None. FINDINGS: CT HEAD FINDINGS There is moderate left anterior frontal scalp contusion. There are bilateral periorbital / extraconal orbital contusions, left greater than right. No evidence of parenchymal hemorrhage or extra-axial fluid collection. No mass lesion, mass effect, or midline shift. No CT evidence of acute infarction. Cerebral volume is age appropriate. No ventriculomegaly. The visualized paranasal sinuses are essentially clear. There are minimal inferior mastoid effusions bilaterally. No evidence of calvarial fracture. CT MAXILLOFACIAL FINDINGS Moderate left anterior frontal scalp contusion. Bilateral periorbital/ extraconal orbital contusions, left greater than right. No intraconal orbital hematoma. Intact globes. No maxillofacial fracture. The maxilla and mandible appear intact. The nasal bones are unremarkable in appearance. No dislocation at the temporomandibular joints. The visualized dentition demonstrates no acute abnormality. The visualized paranasal sinuses are essentially clear. Minimal inferior mastoid effusions bilaterally. No aggressive appearing focal osseous lesions. No extra-axial collections, intracranial hemorrhage or mass effect in the visualized intracranial regions. The parapharyngeal fat planes are preserved. The nasopharynx, oropharynx and  hypopharynx are unremarkable in appearance. The parotid and submandibular glands are within normal limits. No cervical lymphadenopathy is seen. IMPRESSION: 1. Moderate left anterior frontal scalp contusion. Bilateral periorbital / extraconal orbital contusions, left greater than right. No intraconal orbital hematoma. 2. No evidence of acute intracranial abnormality. No evidence of calvarial fracture. 3. No maxillofacial fracture. Electronically Signed   By: Ilona Sorrel M.D.   On: 10/01/2015 17:20   Ct Maxillofacial Wo Cm  10/01/2015  CLINICAL DATA:  Fall 2 days prior with left orbital/frontal injury. EXAM: CT HEAD WITHOUT CONTRAST CT MAXILLOFACIAL WITHOUT CONTRAST TECHNIQUE: Multidetector CT  imaging of the head and maxillofacial structures were performed using the standard protocol without intravenous contrast. Multiplanar CT image reconstructions of the maxillofacial structures were also generated. COMPARISON:  None. FINDINGS: CT HEAD FINDINGS There is moderate left anterior frontal scalp contusion. There are bilateral periorbital / extraconal orbital contusions, left greater than right. No evidence of parenchymal hemorrhage or extra-axial fluid collection. No mass lesion, mass effect, or midline shift. No CT evidence of acute infarction. Cerebral volume is age appropriate. No ventriculomegaly. The visualized paranasal sinuses are essentially clear. There are minimal inferior mastoid effusions bilaterally. No evidence of calvarial fracture. CT MAXILLOFACIAL FINDINGS Moderate left anterior frontal scalp contusion. Bilateral periorbital/ extraconal orbital contusions, left greater than right. No intraconal orbital hematoma. Intact globes. No maxillofacial fracture. The maxilla and mandible appear intact. The nasal bones are unremarkable in appearance. No dislocation at the temporomandibular joints. The visualized dentition demonstrates no acute abnormality. The visualized paranasal sinuses are essentially clear.  Minimal inferior mastoid effusions bilaterally. No aggressive appearing focal osseous lesions. No extra-axial collections, intracranial hemorrhage or mass effect in the visualized intracranial regions. The parapharyngeal fat planes are preserved. The nasopharynx, oropharynx and hypopharynx are unremarkable in appearance. The parotid and submandibular glands are within normal limits. No cervical lymphadenopathy is seen. IMPRESSION: 1. Moderate left anterior frontal scalp contusion. Bilateral periorbital / extraconal orbital contusions, left greater than right. No intraconal orbital hematoma. 2. No evidence of acute intracranial abnormality. No evidence of calvarial fracture. 3. No maxillofacial fracture. Electronically Signed   By: Ilona Sorrel M.D.   On: 10/01/2015 17:20   I have personally reviewed and evaluated these images and lab results as part of my medical decision-making.   EKG Interpretation None      MDM   Final diagnoses:  Fall, initial encounter   Mechanical fall multiple days ago. Patient is on aspirin. CT head without subdural hematoma or fracture. CT face without any fractures. Patient has full extraocular movements, doubt entrapment or retro-orbital hematoma. No vision changes. Neurological exam is normal. X-rays of knees negative for fracture and patient able to ambulate. Discharge home. Return for any severe worse pain, intractable vomiting, new focal neurological deficits, seizures, or any other concerning symptoms for another emergency. Patient verbalized understanding and agreement with plan.    Karma Greaser, MD 10/02/15 0028  Gareth Morgan, MD 10/02/15 [phone]

## 2015-10-01 NOTE — ED Notes (Addendum)
Pt reports falling on Thursday. No loc, hit head on cabinets. Initially had knot on her left forehead and swelling to left eye. Reports now swelling has also spread to right eye.

## 2015-10-13 ENCOUNTER — Encounter: Payer: Self-pay | Admitting: Family Medicine

## 2015-10-13 ENCOUNTER — Ambulatory Visit (INDEPENDENT_AMBULATORY_CARE_PROVIDER_SITE_OTHER): Payer: 59 | Admitting: Family Medicine

## 2015-10-13 VITALS — BP 144/78 | HR 69 | Temp 97.6°F | Ht 66.0 in | Wt 298.0 lb

## 2015-10-13 DIAGNOSIS — E785 Hyperlipidemia, unspecified: Secondary | ICD-10-CM | POA: Diagnosis not present

## 2015-10-13 DIAGNOSIS — E1140 Type 2 diabetes mellitus with diabetic neuropathy, unspecified: Secondary | ICD-10-CM | POA: Diagnosis not present

## 2015-10-13 DIAGNOSIS — I10 Essential (primary) hypertension: Secondary | ICD-10-CM

## 2015-10-13 DIAGNOSIS — Z1159 Encounter for screening for other viral diseases: Secondary | ICD-10-CM

## 2015-10-13 DIAGNOSIS — Z794 Long term (current) use of insulin: Secondary | ICD-10-CM

## 2015-10-13 DIAGNOSIS — E1100 Type 2 diabetes mellitus with hyperosmolarity without nonketotic hyperglycemic-hyperosmolar coma (NKHHC): Secondary | ICD-10-CM

## 2015-10-13 DIAGNOSIS — N182 Chronic kidney disease, stage 2 (mild): Secondary | ICD-10-CM

## 2015-10-13 LAB — BASIC METABOLIC PANEL
BUN: 36 mg/dL — ABNORMAL HIGH (ref 7–25)
CHLORIDE: 105 mmol/L (ref 98–110)
CO2: 23 mmol/L (ref 20–31)
Calcium: 9 mg/dL (ref 8.6–10.4)
Creat: 1.35 mg/dL — ABNORMAL HIGH (ref 0.50–0.99)
GLUCOSE: 158 mg/dL — AB (ref 65–99)
POTASSIUM: 4.5 mmol/L (ref 3.5–5.3)
SODIUM: 137 mmol/L (ref 135–146)

## 2015-10-13 LAB — POCT GLYCOSYLATED HEMOGLOBIN (HGB A1C): Hemoglobin A1C: 10.3

## 2015-10-13 LAB — LDL CHOLESTEROL, DIRECT: LDL DIRECT: 223 mg/dL — AB (ref <130)

## 2015-10-13 LAB — HEPATITIS C ANTIBODY: HCV AB: NEGATIVE

## 2015-10-13 MED ORDER — DULOXETINE HCL 30 MG PO CPEP: 30.0000 mg | ORAL_CAPSULE | Freq: Every day | ORAL | Status: DC

## 2015-10-13 MED ORDER — INSULIN ISOPHANE & REGULAR (HUMAN 70-30)100 UNIT/ML KWIKPEN: 70.0000 [IU] | PEN_INJECTOR | Freq: Two times a day (BID) | SUBCUTANEOUS | Status: DC

## 2015-10-13 MED ORDER — DIPHENOXYLATE-ATROPINE 2.5-0.025 MG PO TABS: 1.0000 | ORAL_TABLET | Freq: Four times a day (QID) | ORAL | Status: DC | PRN

## 2015-10-13 NOTE — Patient Instructions (Signed)
Investigate the bariatric surgery. I will call with lab results. See me in three months or sooner. Increase your insulin to 70 units twice a day.

## 2015-10-14 NOTE — Assessment & Plan Note (Signed)
Creat is up on BMP.  Recheck in 6 weeks.  Focus on control of risk factors.

## 2015-10-14 NOTE — Assessment & Plan Note (Signed)
LDL way high.  When I called to give the results, I was not surprised to learn that she had not been taking her meds.  Start back on crestor and recheck in 6 weeks.

## 2015-10-14 NOTE — Progress Notes (Signed)
   Subjective:    Patient ID: Rebecca Hicks, female    DOB: 1945/11/18, 71 y.o.   MRN: [id number]  HPI First visit in six months.  Issues revolve around poor control of chronic problems. 1. HBP.  Taking meds.  This is the best controled of her chronic issues. 2. DM.  A1C today is 10.3.  States she is taking her insulin BID. 3. Morbid obesity.  Weight is as high as it has ever been.  Now willing to consider bariatric surgery. 4. Hypercholesterolemia.  Has been non compliant with statins over the years.  Due for a recheck.  We had an even more serious discussion about the multiple effects of cardiovascular risk factors.  She is a heart attack/stroke waiting to happen.  She lives an enjoyable, fullfilling life and would like it to continue for a while.    Review of Systems  Denies CP, SOB of episodes of focal neuro symptoms.       Objective:   Physical Exam Lungs clear Cardiac RRR without m or g  Foot exam done.        Assessment & Plan:  > 50 % of visit was counseling (a "Come to Jesus" talk).  Duration of visit was 30 minutes.

## 2015-10-14 NOTE — Assessment & Plan Note (Signed)
Continue to work on diet.  Consider bariatric surgery

## 2015-10-14 NOTE — Assessment & Plan Note (Signed)
Less than ideal control but this is the least of my concerns today.

## 2015-10-14 NOTE — Assessment & Plan Note (Signed)
Increase lantus to 70 units bid.

## 2015-10-21 ENCOUNTER — Ambulatory Visit (HOSPITAL_BASED_OUTPATIENT_CLINIC_OR_DEPARTMENT_OTHER): Payer: 59 | Attending: Emergency Medicine | Admitting: Pulmonary Disease

## 2015-10-21 VITALS — Ht 66.0 in | Wt 294.0 lb

## 2015-10-21 DIAGNOSIS — G4733 Obstructive sleep apnea (adult) (pediatric): Secondary | ICD-10-CM | POA: Diagnosis not present

## 2015-10-21 DIAGNOSIS — D860 Sarcoidosis of lung: Secondary | ICD-10-CM | POA: Insufficient documentation

## 2015-10-21 DIAGNOSIS — R0683 Snoring: Secondary | ICD-10-CM

## 2015-10-21 DIAGNOSIS — G4761 Periodic limb movement disorder: Secondary | ICD-10-CM

## 2015-10-21 DIAGNOSIS — E662 Morbid (severe) obesity with alveolar hypoventilation: Secondary | ICD-10-CM | POA: Insufficient documentation

## 2015-10-21 DIAGNOSIS — Z6841 Body Mass Index (BMI) 40.0 and over, adult: Secondary | ICD-10-CM | POA: Diagnosis not present

## 2015-10-30 ENCOUNTER — Other Ambulatory Visit: Payer: Self-pay | Admitting: Family Medicine

## 2015-11-10 DIAGNOSIS — J984 Other disorders of lung: Secondary | ICD-10-CM

## 2015-11-10 DIAGNOSIS — G4736 Sleep related hypoventilation in conditions classified elsewhere: Secondary | ICD-10-CM | POA: Insufficient documentation

## 2015-11-10 DIAGNOSIS — R0683 Snoring: Secondary | ICD-10-CM | POA: Diagnosis not present

## 2015-11-10 DIAGNOSIS — E662 Morbid (severe) obesity with alveolar hypoventilation: Secondary | ICD-10-CM | POA: Diagnosis not present

## 2015-11-10 DIAGNOSIS — G4733 Obstructive sleep apnea (adult) (pediatric): Secondary | ICD-10-CM

## 2015-11-10 DIAGNOSIS — G4761 Periodic limb movement disorder: Secondary | ICD-10-CM | POA: Diagnosis not present

## 2015-11-10 HISTORY — DX: Morbid (severe) obesity with alveolar hypoventilation: E66.2

## 2015-11-10 HISTORY — DX: Sleep related hypoventilation in conditions classified elsewhere: G47.36

## 2015-11-10 NOTE — Procedures (Signed)
Patient Name: Rebecca Hicks, Rebecca Hicks Date: 10/21/2015 Gender: Female D.O.B: 11/16/1945 Age (years): 69 Referring Provider: Baltazar Apo Height (inches): 55 Interpreting Physician: Chesley Mires MD, ABSM Weight (lbs): 294 RPSGT: Joni Reining BMI: 73 MRN: [id number] Neck Size: 15.50  CLINICAL INFORMATION Sleep Study Type: NPSG   Indication for sleep study: 70 yr old female with hx of pulmonary sarcoidosis presents for evaluation of snoring, sleep disruption, and daytime sleepiness.   Epworth Sleepiness Score: 8 out of 24.   SLEEP STUDY TECHNIQUE As per the AASM Manual for the Scoring of Sleep and Associated Events v2.3 (April 2016) with a hypopnea requiring 4% desaturations. The channels recorded and monitored were frontal, central and occipital EEG, electrooculogram (EOG), submentalis EMG (chin), nasal and oral airflow, thoracic and abdominal wall motion, anterior tibialis EMG, snore microphone, electrocardiogram, and pulse oximetry.  MEDICATIONS Patient's medications include: reviewed in electronic medical record. Medications self-administered by patient during sleep study : No sleep medicine administered.  SLEEP ARCHITECTURE The study was initiated at 10:59:20 PM and ended at 5:03:16 AM. Sleep onset time was 12.6 minutes and the sleep efficiency was 38.7%. The total sleep time was 140.8 minutes. Stage REM latency was N/A minutes. The patient spent 40.83% of the night in stage N1 sleep, 59.17% in stage N2 sleep, 0.00% in stage N3 and 0.00% in REM. Alpha intrusion was absent. Supine sleep was 3.55%.  RESPIRATORY PARAMETERS The overall apnea/hypopnea index (AHI) was 31.5 per hour. There were 2 total apneas, including 2 obstructive, 0 central and 0 mixed apneas. There were 72 hypopneas and 4 RERAs. The AHI during Stage REM sleep was N/A per hour. AHI while supine was 60.0 per hour. The mean oxygen saturation was 86.48%. The minimum SpO2 during sleep was 80.00%.  She spent 153  minutes of test time with an SpO2 < 88%.  Supplemental oxygen was not used during the study. Moderate snoring was noted during this study.  CARDIAC DATA The 2 lead EKG demonstrated sinus rhythm. The mean heart rate was 61.33 beats per minute. Other EKG findings include: None.  LEG MOVEMENT DATA The total PLMS were 52 with a resulting PLMS index of 22.16. Associated arousal with leg movement index was 4.7 .  IMPRESSIONS - This study showed severe obstructive sleep apnea with an AHI of 31.5. - The SpO2 low of 80%.  She spent 153 minutes with an SpO2 < 88%.  These were also observed in the abscence of obstructive respiratory events, raising the concern for hypoventilation. - She had an increase in her periodic limb movement index.  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 - She should be educated on the importance of weight loss. - She should be assessed for the presence of restless leg syndrome.   [Electronically signed] 11/10/2015 08:13 AM  Chesley Mires MD, ABSM Diplomate, American Board of Sleep Medicine   NPI: [id number]

## 2015-11-17 ENCOUNTER — Encounter: Payer: Self-pay | Admitting: Emergency Medicine

## 2015-11-17 ENCOUNTER — Ambulatory Visit (INDEPENDENT_AMBULATORY_CARE_PROVIDER_SITE_OTHER): Payer: 59 | Admitting: Emergency Medicine

## 2015-11-17 VITALS — BP 162/90 | HR 65 | Ht 66.0 in | Wt 299.0 lb

## 2015-11-17 DIAGNOSIS — G4733 Obstructive sleep apnea (adult) (pediatric): Secondary | ICD-10-CM | POA: Diagnosis not present

## 2015-11-17 DIAGNOSIS — D869 Sarcoidosis, unspecified: Secondary | ICD-10-CM

## 2015-11-17 HISTORY — DX: Obstructive sleep apnea (adult) (pediatric): G47.33

## 2015-11-17 MED ORDER — BUDESONIDE-FORMOTEROL FUMARATE 160-4.5 MCG/ACT IN AERO: 2.0000 | INHALATION_SPRAY | Freq: Two times a day (BID) | RESPIRATORY_TRACT | 5 refills | Status: DC

## 2015-11-17 MED ORDER — BUDESONIDE-FORMOTEROL FUMARATE 160-4.5 MCG/ACT IN AERO: 2.0000 | INHALATION_SPRAY | Freq: Two times a day (BID) | RESPIRATORY_TRACT | 0 refills | Status: DC

## 2015-11-17 MED ORDER — BUDESONIDE-FORMOTEROL FUMARATE 160-4.5 MCG/ACT IN AERO: 2.0000 | INHALATION_SPRAY | Freq: Two times a day (BID) | RESPIRATORY_TRACT | 1 refills | Status: DC

## 2015-11-17 NOTE — Patient Instructions (Addendum)
We will arrange for a CPAP titration study to be done in the Sleep lab.  Please continue your albuterol 2 puffs as needed.  Please start Symbicort 2 puffs twice a day. Please be sure to rinse and gargle every time after using Follow with Dr Lamonte Sakai in 2 months or sooner if you have any problems.

## 2015-11-17 NOTE — Assessment & Plan Note (Signed)
Confirmed on polysomnogram since her last visit. Her AHI was 31.5. We will perform a CPAP titration study and then follow-up to initiate positive pressure at night.

## 2015-11-17 NOTE — Progress Notes (Signed)
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  She describes some rash that has affected her face and knees over the last few months. She has had some joint injections of cortisone. Has also use topical cortisone. The rash is red and bumpy. She has also had some progressive dyspnea over this time. No wheeze. Not on BD's.   Acute OV 09/05/15 -- this is an acute visit for patient with a history of sarcoidosis. Showed a normal spirometry in October 2016. Most recent CT scan of the chest was done on 03/01/15 that I personally reviewed. There was adenopathy but no evidence of active sarcoid, there was a stable left upper lobe granuloma. There was no interstitial lung disease. There was significant bilateral air trapping consistent with possible obstruction. She reports today that her exercise ability has decreased over the last 2 months- - she isn'tt sure whether this is deconditioning from decreased exercise. She also reports that she has URI sx and cough all through the winter months, was treated with abx x 3, received pred x 1. Her family has noted that she has to stop more frequently to rest. She is having some allergic drainage, minimal cough now.  No bouts of rash currently, but she did have some during the winter months. She is a heavy snorer and is ready now to get her polysomnogram which we had canceled before  ROV 11/17/15 -- Follow-up visit for history of sarcoidosis, Obesity with obstructive sleep apnea and obesity hypoventilation syndrome. She has probable associated obstructive lung disease with air trapping on chest imaging, Although repeat spirometry on 5/15 did not show any overt obstruction. She had a sleep study on 10/21/15 the results of which I have reviewed. This shows severe obstructive sleep apnea with AHI 31.5 per hour associated with hypoxemia on room air. Recommendation was for her to return  to the sleep lab to determine whether she needs CPAP versus BiPAP with associated submental oxygen.  She has had some rash on her hands. And her R knee. Her breathing continues to be limiting. Since last time it was discovered that she had black mold in her wall / floors that had to be repaired. She feels better not that the repair has been done.   Exam:  Vitals:   11/17/15 1031  BP: (!) 162/90  Pulse: 65  SpO2: 96%  Weight: 299 lb (135.6 kg)  Height: 5\' 6"  (1.676 m)    Gen: Pleasant, obese, in no distress,  normal affect, obese   ENT: No lesions,  mouth clear,  oropharynx clear, no postnasal drip  Neck: No JVD, no TMG, no carotid bruits  Lungs: No use of accessory muscles, no dullness to percussion, coarse BS w/ no wheezing   Cardiovascular: RRR, heart sounds normal, no murmur or gallops, no peripheral edema  Musculoskeletal: No deformities, no cyanosis or clubbing  Neuro: alert, non focal  Skin: Warm,  LE and knees without rash.    03/01/15 --  COMPARISON: 01/26/2011 chest CT.  FINDINGS: Mediastinum/Nodes: Normal heart size. No pericardial fluid/thickening. There is atherosclerosis of the thoracic aorta, the great vessels of the mediastinum and the coronary arteries, including calcified atherosclerotic plaque in the left main, left anterior descending, left circumflex and right coronary arteries. Great vessels are normal in course and caliber. Normal visualized thyroid. Normal esophagus. No axillary adenopathy. Stable mildly enlarged 1.0 cm right lower paratracheal node (series 2/ image  19). Stable coarse calcifications within a aortopulmonary window and left hilar nodes, in keeping with sarcoidosis. No new mediastinal or appreciable hilar adenopathy.  Lungs/Pleura: No pneumothorax. No pleural effusion. Stable subcentimeter calcified granuloma in the subpleural left upper lobe. No acute consolidative airspace disease, new significant pulmonary nodules or lung  masses. No significant perilymphatic distribution nodularity. No emphysema. No significant regions of ground-glass attenuation, subpleural reticulation, traction bronchiectasis, parenchymal banding, architectural distortion or frank honeycombing. There is significant air trapping throughout both lungs on the expiration sequence.  Upper abdomen: Moderate diffuse hepatic steatosis.  Musculoskeletal: No aggressive appearing focal osseous lesions. Moderate degenerative changes in the thoracic spine.  IMPRESSION: 1. No evidence of active pulmonary sarcoidosis or other interstitial lung disease. Stable left upper lobe granuloma. 2. Significant air trapping throughout both lungs, indicating small airways disease. 3. Stable mild mediastinal and left hilar lymphadenopathy. 4. Left main and 3 vessel coronary atherosclerosis. 5. Moderate diffuse hepatic steatosis.    SARCOIDOSIS, PULMONARY Continue Symbicort Continue albuterol when necessary  Obstructive sleep apnea Confirmed on polysomnogram since her last visit. Her AHI was 31.5. We will perform a CPAP titration study and then follow-up to initiate positive pressure at night.  Baltazar Apo, MD, PhD 11/17/2015, 12:45 PM [facility] [phone] or if no answer [phone]

## 2015-11-17 NOTE — Assessment & Plan Note (Signed)
Continue Symbicort Continue albuterol when necessary

## 2015-11-25 ENCOUNTER — Telehealth: Payer: Self-pay | Admitting: Family Medicine

## 2015-11-25 NOTE — Telephone Encounter (Signed)
Pt called and would like to speak to Dr. Andria Frames about a medication she is interested in.Please call her to discuss. jw

## 2015-11-29 ENCOUNTER — Encounter (HOSPITAL_BASED_OUTPATIENT_CLINIC_OR_DEPARTMENT_OTHER): Payer: 59

## 2015-12-01 ENCOUNTER — Ambulatory Visit: Payer: 59 | Admitting: Family Medicine

## 2015-12-02 ENCOUNTER — Encounter: Payer: Self-pay | Admitting: Family Medicine

## 2015-12-02 NOTE — Telephone Encounter (Signed)
Called multiple times and left mult messages.  Asked patient to call us back if she still has questions.  Apologized for the delay in initially returning her call.

## 2015-12-03 ENCOUNTER — Other Ambulatory Visit: Payer: Self-pay | Admitting: Family Medicine

## 2015-12-03 DIAGNOSIS — I10 Essential (primary) hypertension: Secondary | ICD-10-CM

## 2015-12-06 MED ORDER — PREGABALIN 150 MG PO CAPS
150.0000 mg | ORAL_CAPSULE | Freq: Two times a day (BID) | ORAL | 3 refills | Status: DC
Start: 2015-12-06 — End: 2016-02-28

## 2015-12-15 ENCOUNTER — Ambulatory Visit (INDEPENDENT_AMBULATORY_CARE_PROVIDER_SITE_OTHER): Payer: 59 | Admitting: Family Medicine

## 2015-12-15 ENCOUNTER — Encounter: Payer: Self-pay | Admitting: Family Medicine

## 2015-12-15 DIAGNOSIS — R270 Ataxia, unspecified: Secondary | ICD-10-CM | POA: Diagnosis not present

## 2015-12-15 DIAGNOSIS — Z23 Encounter for immunization: Secondary | ICD-10-CM

## 2015-12-15 DIAGNOSIS — R251 Tremor, unspecified: Secondary | ICD-10-CM | POA: Diagnosis not present

## 2015-12-15 HISTORY — DX: Ataxia, unspecified: R27.0

## 2015-12-15 HISTORY — DX: Tremor, unspecified: R25.1

## 2015-12-15 NOTE — Patient Instructions (Signed)
We will set you up for an MRI of the brain to make sure you have not had a stroke.  We will decide on meds when I call with results.

## 2015-12-15 NOTE — Progress Notes (Signed)
[facility] will be calling the pt to ask screening questions and to schedule appt. Ottis Stain, CMA

## 2015-12-16 NOTE — Assessment & Plan Note (Signed)
Unclear.  My most immediate concern is CVA.  MRI, await results.

## 2015-12-16 NOTE — Assessment & Plan Note (Signed)
Again, R/O CVA.  Long diff dx.

## 2015-12-16 NOTE — Progress Notes (Signed)
   Subjective:    Patient ID: Rebecca Hicks, female    DOB: 06/03/1945, 70 y.o.   MRN: [id number]  HPI Patient with a two week hx of an unusual constellation of neuro symptoms. Shaking of initially left hand, now both hands.  [place]umsey when using typewriter. Feels wobbly, off balance, holds on to walls for support.   Occasionally, speech seems "funny"  Describes more dysarthria than aphasia. No diplopia or visual changes.  No weakness or numbness Only recent change is began lyrica.  Symptoms began before lyrica Obviously, has several risk factors for CVA.    Review of Systems     Objective:   Physical Exam Mild rapid frequency tremor of hands.  EOMI. Speech normal to me. Gait is mildly ataxic. Rhomberg and cerebellar testing neg.        Assessment & Plan:

## 2015-12-24 ENCOUNTER — Ambulatory Visit
Admission: RE | Admit: 2015-12-24 | Discharge: 2015-12-24 | Disposition: A | Discharge status: Home / Self Care | Payer: 59 | Source: Ambulatory Visit | Attending: Family Medicine | Admitting: Family Medicine

## 2015-12-24 DIAGNOSIS — R270 Ataxia, unspecified: Secondary | ICD-10-CM

## 2015-12-28 ENCOUNTER — Telehealth: Payer: Self-pay | Admitting: Family Medicine

## 2015-12-28 NOTE — Telephone Encounter (Signed)
Pt stated she missed a call from Dr. Andria Frames, pt believed it was regarding lab results. Pt would like Dr. Andria Frames to call her back on her cell. Please advise. Thanks! ep

## 2015-12-28 NOTE — Telephone Encounter (Signed)
Spoke with patient and given results (no CVA on MRI.)  Will hold lyrica since she is still symptomatic.

## 2016-01-01 ENCOUNTER — Encounter (HOSPITAL_BASED_OUTPATIENT_CLINIC_OR_DEPARTMENT_OTHER): Payer: 59

## 2016-01-05 ENCOUNTER — Other Ambulatory Visit: Payer: Self-pay | Admitting: Family Medicine

## 2016-01-05 ENCOUNTER — Encounter: Payer: Self-pay | Admitting: Family Medicine

## 2016-01-05 DIAGNOSIS — Z794 Long term (current) use of insulin: Principal | ICD-10-CM

## 2016-01-05 DIAGNOSIS — E1140 Type 2 diabetes mellitus with diabetic neuropathy, unspecified: Secondary | ICD-10-CM

## 2016-01-09 MED ORDER — METFORMIN HCL 1000 MG PO TABS: 1000.0000 mg | ORAL_TABLET | Freq: Two times a day (BID) | ORAL | 3 refills | Status: DC

## 2016-01-10 ENCOUNTER — Ambulatory Visit (HOSPITAL_BASED_OUTPATIENT_CLINIC_OR_DEPARTMENT_OTHER): Payer: 59

## 2016-01-10 ENCOUNTER — Encounter (HOSPITAL_BASED_OUTPATIENT_CLINIC_OR_DEPARTMENT_OTHER): Payer: Self-pay

## 2016-01-12 ENCOUNTER — Other Ambulatory Visit: Payer: Self-pay | Admitting: Family Medicine

## 2016-01-12 DIAGNOSIS — E1165 Type 2 diabetes mellitus with hyperglycemia: Secondary | ICD-10-CM

## 2016-01-12 DIAGNOSIS — IMO0002 Reserved for concepts with insufficient information to code with codable children: Secondary | ICD-10-CM

## 2016-01-19 ENCOUNTER — Ambulatory Visit: Payer: 59 | Admitting: Emergency Medicine

## 2016-02-09 ENCOUNTER — Other Ambulatory Visit (HOSPITAL_COMMUNITY): Payer: Self-pay | Admitting: Sports Medicine

## 2016-02-09 ENCOUNTER — Ambulatory Visit (HOSPITAL_COMMUNITY)
Admission: RE | Admit: 2016-02-09 | Discharge: 2016-02-09 | Disposition: A | Discharge status: Home / Self Care | Payer: 59 | Source: Ambulatory Visit | Attending: Sports Medicine | Admitting: Sports Medicine

## 2016-02-09 DIAGNOSIS — M7989 Other specified soft tissue disorders: Secondary | ICD-10-CM | POA: Diagnosis not present

## 2016-02-09 DIAGNOSIS — M79605 Pain in left leg: Secondary | ICD-10-CM

## 2016-02-09 NOTE — Progress Notes (Signed)
VASCULAR LAB PRELIMINARY  PRELIMINARY  PRELIMINARY  PRELIMINARY  Left lower extremity venous duplex completed.    Preliminary report:  Left:  No evidence of DVT, superficial thrombosis, or Baker's cyst.  Rebecca Hicks, RVS 02/09/2016, 1:04 PM

## 2016-02-27 ENCOUNTER — Encounter: Payer: Self-pay | Admitting: Physician Assistant

## 2016-02-28 ENCOUNTER — Ambulatory Visit (INDEPENDENT_AMBULATORY_CARE_PROVIDER_SITE_OTHER): Payer: 59 | Admitting: Physician Assistant

## 2016-02-28 ENCOUNTER — Encounter: Payer: Self-pay | Admitting: Physician Assistant

## 2016-02-28 ENCOUNTER — Other Ambulatory Visit: Payer: Self-pay | Admitting: Family Medicine

## 2016-02-28 VITALS — BP 140/78 | HR 62 | Ht 66.0 in | Wt 298.0 lb

## 2016-02-28 DIAGNOSIS — R0602 Shortness of breath: Secondary | ICD-10-CM

## 2016-02-28 DIAGNOSIS — E7800 Pure hypercholesterolemia, unspecified: Secondary | ICD-10-CM

## 2016-02-28 DIAGNOSIS — R072 Precordial pain: Secondary | ICD-10-CM

## 2016-02-28 DIAGNOSIS — I10 Essential (primary) hypertension: Secondary | ICD-10-CM

## 2016-02-28 DIAGNOSIS — D869 Sarcoidosis, unspecified: Secondary | ICD-10-CM

## 2016-02-28 DIAGNOSIS — G4733 Obstructive sleep apnea (adult) (pediatric): Secondary | ICD-10-CM

## 2016-02-28 LAB — CBC WITH DIFFERENTIAL/PLATELET
BASOS PCT: 1 %
Basophils Absolute: 50 cells/uL (ref 0–200)
EOS PCT: 3 %
Eosinophils Absolute: 150 cells/uL (ref 15–500)
HCT: 41.3 % (ref 35.0–45.0)
HEMOGLOBIN: 13.2 g/dL (ref 11.7–15.5)
LYMPHS ABS: 1450 {cells}/uL (ref 850–3900)
Lymphocytes Relative: 29 %
MCH: 28.3 pg (ref 27.0–33.0)
MCHC: 32 g/dL (ref 32.0–36.0)
MCV: 88.4 fL (ref 80.0–100.0)
MPV: 11.8 fL (ref 7.5–12.5)
Monocytes Absolute: 500 cells/uL (ref 200–950)
Monocytes Relative: 10 %
NEUTROS ABS: 2850 {cells}/uL (ref 1500–7800)
NEUTROS PCT: 57 %
Platelets: 154 10*3/uL (ref 140–400)
RBC: 4.67 MIL/uL (ref 3.80–5.10)
RDW: 15.1 % — ABNORMAL HIGH (ref 11.0–15.0)
WBC: 5 10*3/uL (ref 3.8–10.8)

## 2016-02-28 LAB — TROPONIN I

## 2016-02-28 MED ORDER — FAMOTIDINE 20 MG PO TABS: ORAL_TABLET | ORAL | 3 refills | Status: DC

## 2016-02-28 NOTE — Patient Instructions (Addendum)
Medication Instructions:  Your physician has recommended you make the following change in your medication:  Start Pepcid 20 mg twice daily for 1-2 weeks and then use as needed.   Labwork: Lab work to be done today--BMP, CBC, BNP, troponin (STAT)  Testing/Procedures: Your physician has requested that you have a lexiscan myoview. For further information please visit [email or web address]. Please follow instruction sheet, as given.  Your physician has requested that you have an echocardiogram. Echocardiography is a painless test that uses sound waves to create images of your heart. It provides your doctor with information about the size and shape of your heart and how well your heart's chambers and valves are working. This procedure takes approximately one hour. There are no restrictions for this procedure.  Follow-Up: .Your physician recommends that you schedule a follow-up appointment in:  In 2-3 weeks with Dr. Acie Fredrickson or with Richardson Dopp, PA on day Dr. Cathie Olden is in office.   Any Other Special Instructions Will Be Listed Below (If Applicable).  If you need a refill on your cardiac medications before your next appointment, please call your pharmacy.

## 2016-02-28 NOTE — Progress Notes (Signed)
Cardiology Office Note:    Date:  02/28/2016   ID:  Rebecca Hicks, DOB 21-Aug-1945, MRN [id number]  PCP:  Zigmund Gottron, MD  Cardiologist:  Dr. Liam Rogers   Electrophysiologist:  N/a Pulmonologist: Dr. Lamonte Sakai  Referring MD: Zenia Resides, MD   Chief Complaint  Patient presents with  . Chest Pain    History of Present Illness:    Rebecca Hicks is a 70 y.o. female with a hx of HTN, HL, DM, pulmonary sarcoidosis, morbid obesity. Nuclear stress test in 2015 was low risk without ischemia. Last seen by Dr. Liam Rogers in 5/16.  PRN follow up recommended.    She presents for evaluation of chest pain and shortness of breath.  She notes substernal throbbing pain over the past 3-4 days. She has had some pain in her L scapula and today in her R scapula.  Her chest pain may last 30-90 minutes when it occurs. It seems to occur more with lying flat.  Her scapular pain is worse with positional changes.  She feels her chest pain is better if she props up.  She does awaken suddenly at times.  She is in the middle of completing a sleep study. She does not really notice if her chest pain is worse with activity.  She does note increased dyspnea on exertion over the last few days.  She denies any symptoms of a sarcoid flare. She has mild pedal edema that is overall improved.  She denies syncope.  She denies coughing, wheezing, fever, bleeding issues, vomiting or diarrhea.     Prior CV studies that were reviewed today include:    High Res Chest CT 11/16 IMPRESSION: 1. No evidence of active pulmonary sarcoidosis or other interstitial lung disease. Stable left upper lobe granuloma. 2. Significant air trapping throughout both lungs, indicating small airways disease. 3. Stable mild mediastinal and left hilar lymphadenopathy. 4. Left main and 3 vessel coronary atherosclerosis. 5. Moderate diffuse hepatic steatosis.  Myoview 11/15 Low risk, breast attenuation, no ischemia, EF 60  Holter  9/11 Sinus rhythm, frequent PACs, PVCs  Echo 8/11 EF 55-60, normal wall motion, grade 1 diastolic dysfunction  Past Medical History:  Diagnosis Date  . Arthritis   . Cataract   . Diabetes mellitus   . Glaucoma   . Hyperlipidemia   . Hypertension   . Morbid obesity ([place])   . Neuromuscular disorder ([place])   . Obstructive sleep apnea 11/17/2015  . Polyneuropathy in diabetes(357.2)   . Sarcoidosis [facility])     Past Surgical History:  Procedure Laterality Date  . ABDOMINAL HYSTERECTOMY    . APPENDECTOMY    . CATARACT EXTRACTION, BILATERAL  03/2010, 04/2010  . COLONOSCOPY  2011  . EYE SURGERY    . POLYPECTOMY  2011   polyps, hems   . SPINE SURGERY     lumbar laminectomy X 2  . TONSILLECTOMY      Current Medications: Current Meds  Medication Sig  . albuterol (PROVENTIL HFA;VENTOLIN HFA) 108 (90 Base) MCG/ACT inhaler Inhale 2 puffs into the lungs every 6 (six) hours as needed for wheezing or shortness of breath.  Marland Kitchen amLODipine (NORVASC) 10 MG tablet Take 1 tablet by mouth  daily  . aspirin 81 MG tablet Take 81 mg by mouth daily.    . BD ULTRA-FINE PEN NEEDLES 29G X 12.7MM MISC Use two times daily  . budesonide-formoterol (SYMBICORT) 160-4.5 MCG/ACT inhaler Inhale 2 puffs into the lungs 2 (two) times daily.  . cloNIDine (CATAPRES)  0.1 MG tablet TAKE 1 TABLET BY MOUTH 3  TIMES DAILY  . diphenoxylate-atropine (LOMOTIL) 2.5-0.025 MG tablet Take 1 tablet by mouth 4 (four) times daily as needed for diarrhea or loose stools.  . DULoxetine (CYMBALTA) 30 MG capsule Take 1 capsule (30 mg total) by mouth daily.  Marland Kitchen HUMULIN 70/30 KWIKPEN (70-30) 100 UNIT/ML PEN Inject 70 units every  morning & 60 units before  evening meal.  . metFORMIN (GLUCOPHAGE) 500 MG tablet Take 500 mg by mouth 2 (two) times daily with a meal.  . metoprolol (TOPROL-XL) 200 MG 24 hr tablet Take 0.5 tablets (100 mg total) by mouth daily.  . nitroGLYCERIN (NITROSTAT) 0.4 MG SL tablet Place 1 tablet (0.4 mg total) under the  tongue every 5 (five) minutes as needed for chest pain. If 3rd tablet needed - go to emergency room or call 911.  Marland Kitchen ONE TOUCH ULTRA TEST test strip Test 3 times daily  . rosuvastatin (CRESTOR) 10 MG tablet Take 10 mg by mouth 3 (three) times a week.  . spironolactone (ALDACTONE) 25 MG tablet Take 1 tablet (25 mg total) by mouth daily.  Marland Kitchen telmisartan-hydrochlorothiazide (MICARDIS HCT) 80-12.5 MG tablet Take 1 tablet by mouth  daily  . timolol (TIMOPTIC) 0.25 % ophthalmic solution Place 1 drop into both eyes daily. Per optho      Allergies:   Penicillins   Social History   Social History  . Marital status: Single    Spouse name: N/A  . Number of children: 0  . Years of education: college   Occupational History  . Manager Unemployed   Social History Main Topics  . Smoking status: Former Smoker    Packs/day: 1.00    Years: 20.00    Types: Cigarettes    Quit date: 04/24/1995  . Smokeless tobacco: Never Used  . Alcohol use No  . Drug use: No  . Sexual activity: Not Asked   Other Topics Concern  . None   Social History Narrative  . None     Family History:  The patient's family history includes Alcohol abuse in her brother and father; Breast cancer in her maternal aunt; Cervical cancer in her mother; Heart disease in her brother; Pancreatic cancer in her maternal uncle; Stroke in her father and paternal grandmother.   ROS:   Please see the history of present illness.    ROS All other systems reviewed and are negative.   EKGs/Labs/Other Test Reviewed:    EKG:  EKG is  ordered today.  The ekg ordered today demonstrates NSR, HR 61, normal axis, nonspecific ST-T wave changes, QTc 414 ms, no change since prior tracing dated 02/23/14  Recent Labs: 10/13/2015: BUN 36; Creat 1.35; Potassium 4.5; Sodium 137   Recent Lipid Panel    Component Value Date/Time   CHOL 372 (H) 01/20/2015 1003   TRIG 51 01/20/2015 1003   HDL 90 01/20/2015 1003   CHOLHDL 4.1 01/20/2015 1003   VLDL 10  01/20/2015 1003   LDLCALC 272 (H) 01/20/2015 1003   LDLDIRECT 223 (H) 10/13/2015 0932     Physical Exam:    VS:  BP 140/78   Pulse 62   Ht 5\' 6"  (1.676 m)   Wt 298 lb (135.2 kg)   BMI 48.10 kg/m     Wt Readings from Last 3 Encounters:  02/28/16 298 lb (135.2 kg)  01/10/16 297 lb (134.7 kg)  12/15/15 298 lb 3.2 oz (135.3 kg)     Physical Exam  Constitutional: She  is oriented to person, place, and time. She appears well-developed and well-nourished. No distress.  HENT:  Head: Normocephalic and atraumatic.  Eyes: No scleral icterus.  Neck: No JVD present.  Cardiovascular: Normal rate, regular rhythm and normal heart sounds.  Exam reveals no friction rub.   No murmur heard. Pulmonary/Chest: Effort normal. She has no wheezes. She has no rales. She exhibits tenderness.  Abdominal: Soft. There is no tenderness.  Musculoskeletal: She exhibits edema.  Trace bilateral LE edema  Neurological: She is alert and oriented to person, place, and time.  Skin: Skin is warm and dry.  Psychiatric: She has a normal mood and affect.    ASSESSMENT:    1. Precordial pain   2. Shortness of breath   3. Essential hypertension   4. Pure hypercholesterolemia   5. SARCOIDOSIS, PULMONARY   6. Obstructive sleep apnea    PLAN:    In order of problems listed above:  1. Chest pain - Her chest pain has atypical > typical features.  She has noted increased shortness of breath.  Her ECG is unchanged.  She had a more prolonged episode last night and this was concerning for her.  She seems to have some discomfort related to positional changes.  Her chest is tender to palpation but this is not the pain she has been experiencing.  She has also had some increased gas symptoms.  She does have risk factors with DM and coronary calcification on a prior Chest CT.    -  Stat Troponin today >> send to ED if abnormal  -  Arrange Lexiscan Myoview  -  Arrange echocardiogram   -  Check BMET, CBC, BNP  -  She can  take Pepcid 20 bid x 1-2 weeks, then PRN  -  She has NTG at home. She can try this for chest pain prn as well.  -  Close FU in 2 weeks.  2. Dyspnea - She does have pulmonary sarcoid.  However, she does not feel she is having a flare.  She dose not appear volume overloaded on exam but her exam is difficult.  Echo in 2011 did demonstrate mild diastolic dysfunction.    -  Check BNP and start Lasix if BNP high  -  Check echocardiogram   3. HTN - Borderline control.  Continue to monitor.  4. HL - Continue Crestor.  5. Pulmonary Sarcoidosis - FU with Pulmonology as planned.    6. OSA - Sleep study pending.    Medication Adjustments/Labs and Tests Ordered: Current medicines are reviewed at length with the patient today.  Concerns regarding medicines are outlined above.  Medication changes, Labs and Tests ordered today are outlined in the Patient Instructions noted below. Patient Instructions  Medication Instructions:  Your physician has recommended you make the following change in your medication:  Start Pepcid 20 mg twice daily for 1-2 weeks and then use as needed.   Labwork: Lab work to be done today--BMP, CBC, BNP, troponin (STAT)  Testing/Procedures: Your physician has requested that you have a lexiscan myoview. For further information please visit [email or web address]. Please follow instruction sheet, as given.  Your physician has requested that you have an echocardiogram. Echocardiography is a painless test that uses sound waves to create images of your heart. It provides your doctor with information about the size and shape of your heart and how well your heart's chambers and valves are working. This procedure takes approximately one hour. There are no restrictions for this procedure.  Follow-Up: .Your physician recommends that you schedule a follow-up appointment in:  In 2-3 weeks with Dr. Acie Fredrickson or with Richardson Dopp, PA on day Dr. Cathie Olden is in office.   Any Other Special  Instructions Will Be Listed Below (If Applicable).  If you need a refill on your cardiac medications before your next appointment, please call your pharmacy.  Signed, Richardson Dopp, PA-C  02/28/2016 5:13 PM    [facility] [address], [place], [place]  [address] Phone: [phone]; Fax: [phone]

## 2016-02-29 ENCOUNTER — Telehealth: Payer: Self-pay | Admitting: *Deleted

## 2016-02-29 DIAGNOSIS — E875 Hyperkalemia: Secondary | ICD-10-CM

## 2016-02-29 LAB — BASIC METABOLIC PANEL
BUN: 25 mg/dL (ref 7–25)
CALCIUM: 9.3 mg/dL (ref 8.6–10.4)
CO2: 25 mmol/L (ref 20–31)
Chloride: 101 mmol/L (ref 98–110)
Creat: 1.43 mg/dL — ABNORMAL HIGH (ref 0.50–0.99)
GLUCOSE: 211 mg/dL — AB (ref 65–99)
POTASSIUM: 5.8 mmol/L — AB (ref 3.5–5.3)
SODIUM: 135 mmol/L (ref 135–146)

## 2016-02-29 LAB — BRAIN NATRIURETIC PEPTIDE: BRAIN NATRIURETIC PEPTIDE: 23.7 pg/mL (ref <100)

## 2016-02-29 NOTE — Telephone Encounter (Signed)
Lmtcb to go over lab results and medication recommendations.

## 2016-03-01 NOTE — Telephone Encounter (Signed)
I spoke to patient. I advised her of the results, medication changes/instructions, and lab appt. I entered orders and made lab appt, linked the two.  Pt voiced understanding and thanks. Results released to [place].

## 2016-03-01 NOTE — Telephone Encounter (Signed)
-----   Message from Michae Kava, [place] sent at 02/29/2016  6:01 PM EST ----- Regarding: Results  Hi ladies,  Could you please do me a favor. I am out of the office Thursday and I tried to reach pt to go over results and recommendations, but had to lmtcb. Would someone please be able to call her and go over results. I appreciate any help you can give in this matter.   Thank you Arbie Cookey  ----- Message ----- From: Liliane Shi, PA-C Sent: 02/29/2016   3:57 PM To: Michae Kava, CMA  Please call Daryll Brod with the results of her lab work: BNP is normal Potassium is high Kidney function is somewhat worse with elevated creatinine Sugar is elevated Hemoglobin is normal Hold Spironolactone x 2 days, then resume Spironolactone 12.5 mg QD. Limit dietary potassium  BMET Monday 03/05/16 Richardson Dopp, PA-C   02/29/2016 3:53 PM

## 2016-03-02 ENCOUNTER — Other Ambulatory Visit: Payer: Self-pay | Admitting: Family Medicine

## 2016-03-05 ENCOUNTER — Other Ambulatory Visit: Payer: 59

## 2016-03-12 ENCOUNTER — Encounter (HOSPITAL_BASED_OUTPATIENT_CLINIC_OR_DEPARTMENT_OTHER): Payer: 59

## 2016-03-12 ENCOUNTER — Encounter (HOSPITAL_BASED_OUTPATIENT_CLINIC_OR_DEPARTMENT_OTHER): Payer: Self-pay

## 2016-03-19 ENCOUNTER — Telehealth (HOSPITAL_COMMUNITY): Payer: Self-pay | Admitting: *Deleted

## 2016-03-19 NOTE — Telephone Encounter (Signed)
Left message on voicemail in reference to upcoming appointment scheduled for 03/21/16. Phone number given for a call back so details instructions can be given. Kanisha Duba, [place]e [place]

## 2016-03-20 ENCOUNTER — Encounter: Payer: Self-pay | Admitting: Cardiovascular Disease

## 2016-03-20 ENCOUNTER — Telehealth (HOSPITAL_COMMUNITY): Payer: Self-pay

## 2016-03-20 NOTE — Telephone Encounter (Signed)
Patient given detailed instructions per Myocardial Perfusion Study Information Sheet for the test on 03/21/2016 at 12:30. Patient notified to arrive 15 minutes early and that it is imperative to arrive on time for appointment to keep from having the test rescheduled.  If you need to cancel or reschedule your appointment, please call the office within 24 hours of your appointment. Failure to do so may result in a cancellation of your appointment, and a $50 no show fee. Patient verbalized understanding.EHK

## 2016-03-21 ENCOUNTER — Ambulatory Visit (HOSPITAL_BASED_OUTPATIENT_CLINIC_OR_DEPARTMENT_OTHER): Payer: 59

## 2016-03-21 ENCOUNTER — Encounter (HOSPITAL_COMMUNITY): Payer: Self-pay

## 2016-03-21 ENCOUNTER — Ambulatory Visit (HOSPITAL_COMMUNITY): Payer: 59 | Attending: Cardiology

## 2016-03-21 DIAGNOSIS — R079 Chest pain, unspecified: Secondary | ICD-10-CM | POA: Insufficient documentation

## 2016-03-21 DIAGNOSIS — R072 Precordial pain: Secondary | ICD-10-CM | POA: Diagnosis not present

## 2016-03-21 DIAGNOSIS — I10 Essential (primary) hypertension: Secondary | ICD-10-CM | POA: Insufficient documentation

## 2016-03-21 DIAGNOSIS — R0600 Dyspnea, unspecified: Secondary | ICD-10-CM | POA: Diagnosis not present

## 2016-03-21 DIAGNOSIS — R0602 Shortness of breath: Secondary | ICD-10-CM | POA: Diagnosis not present

## 2016-03-21 MED ORDER — TECHNETIUM TC 99M TETROFOSMIN IV KIT
32.7000 | PACK | Freq: Once | INTRAVENOUS | Status: AC | PRN
  Administered 2016-03-21: 32.7 via INTRAVENOUS
  Filled 2016-03-21: qty 33

## 2016-03-21 MED ORDER — REGADENOSON 0.4 MG/5ML IV SOLN
0.4000 mg | Freq: Once | INTRAVENOUS | Status: AC
  Administered 2016-03-21: 0.4 mg via INTRAVENOUS

## 2016-03-22 ENCOUNTER — Ambulatory Visit (HOSPITAL_COMMUNITY): Payer: 59 | Attending: Internal Medicine

## 2016-03-22 LAB — MYOCARDIAL PERFUSION IMAGING
CHL CUP NUCLEAR SRS: 1
CHL CUP NUCLEAR SSS: 5
CSEPPHR: 65 {beats}/min
LVDIAVOL: 118 mL (ref 46–106)
LVSYSVOL: 42 mL
NUC STRESS TID: 0.97
RATE: 0.33
Rest HR: 57 {beats}/min
SDS: 4

## 2016-03-22 MED ORDER — TECHNETIUM TC 99M TETROFOSMIN IV KIT
32.5000 | PACK | Freq: Once | INTRAVENOUS | Status: AC | PRN
  Administered 2016-03-22: 32.5 via INTRAVENOUS
  Filled 2016-03-22: qty 33

## 2016-03-23 ENCOUNTER — Telehealth: Payer: Self-pay | Admitting: *Deleted

## 2016-03-23 ENCOUNTER — Encounter: Payer: Self-pay | Admitting: Physician Assistant

## 2016-03-23 NOTE — Telephone Encounter (Signed)
Pt has been notified of Myoview results by phone with verbal understanding. Pt confirmed her appt 12/6 with Dr. Acie Fredrickson.

## 2016-03-26 ENCOUNTER — Telehealth: Payer: Self-pay | Admitting: Pulmonary Disease

## 2016-03-26 NOTE — Telephone Encounter (Signed)
atc pt, no answer, vm full.  Wcb.  

## 2016-03-27 NOTE — Telephone Encounter (Signed)
Spoke with pt. States that she is having issues with completing her sleep study. Every time she goes she is told by the sleep lab that she is to congested to complete the test. Pt has been using OTC decongestants for her sinus congestion with minimal relief. When blowing her nose her mucus is clear in color. The sleep lab is refusing to complete her CPAP titration due to this. Pt would like to know of another decongestant (preferably a prescription) that will help with this.  RB - please advise. Thanks.

## 2016-03-28 ENCOUNTER — Ambulatory Visit (INDEPENDENT_AMBULATORY_CARE_PROVIDER_SITE_OTHER): Payer: 59 | Admitting: Cardiovascular Disease

## 2016-03-28 ENCOUNTER — Encounter: Payer: Self-pay | Admitting: Cardiovascular Disease

## 2016-03-28 VITALS — BP 150/98 | HR 69 | Ht 66.0 in | Wt 296.4 lb

## 2016-03-28 DIAGNOSIS — R0789 Other chest pain: Secondary | ICD-10-CM | POA: Diagnosis not present

## 2016-03-28 DIAGNOSIS — I10 Essential (primary) hypertension: Secondary | ICD-10-CM

## 2016-03-28 HISTORY — DX: Other chest pain: R07.89
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
## 2016-03-28 NOTE — Patient Instructions (Signed)
Medication Instructions:  Your physician recommends that you continue on your current medications as directed. Please refer to the Current Medication list given to you today.   Labwork: None Ordered   Testing/Procedures: None Ordered   Follow-Up: Your physician recommends that you schedule a follow-up appointment in: as needed with Dr. Nahser   If you need a refill on your cardiac medications before your next appointment, please call your pharmacy.   Thank you for choosing [facility]! Teodor Prater, RN [phone]    

## 2016-04-02 NOTE — Telephone Encounter (Signed)
Spoke with patient-aware of recs from [place] and will contact our office if she does not get any relief from Flonase, Nasal washes, and Claritin OTC. Nothing more needed at this time.

## 2016-04-02 NOTE — Telephone Encounter (Signed)
She could start fluticasone nasal spray, 2 sprays each side 1-2 hours before bedtime. Also nasal saline washes once a day and loratadine 10mg  daily if she is not already using a non-sedating anti-histamine.  If persistent watery drainage is the problem then could try atrovent NS, 1 sprays each nostril 1 hour before bed.

## 2016-04-04 ENCOUNTER — Other Ambulatory Visit (HOSPITAL_COMMUNITY): Payer: 59

## 2016-04-25 ENCOUNTER — Other Ambulatory Visit: Payer: Self-pay | Admitting: Emergency Medicine

## 2016-04-27 ENCOUNTER — Encounter: Payer: Self-pay | Admitting: Emergency Medicine

## 2016-04-27 ENCOUNTER — Ambulatory Visit (INDEPENDENT_AMBULATORY_CARE_PROVIDER_SITE_OTHER): Payer: 59 | Admitting: Emergency Medicine

## 2016-04-27 VITALS — BP 146/92 | HR 63 | Ht 66.0 in | Wt 292.2 lb

## 2016-04-27 DIAGNOSIS — G4733 Obstructive sleep apnea (adult) (pediatric): Secondary | ICD-10-CM | POA: Diagnosis not present

## 2016-04-27 MED ORDER — OMEPRAZOLE 20 MG PO CPDR: 20.0000 mg | DELAYED_RELEASE_CAPSULE | Freq: Every day | ORAL | 5 refills | Status: DC

## 2016-04-27 NOTE — Assessment & Plan Note (Signed)
Based on her description, reassuring Myoview, response to omeprazole, I suspect that this is GERD. We will continue omeprazole daily and follow her symptoms.

## 2016-04-27 NOTE — Assessment & Plan Note (Signed)
Appears to be stable at this time. No indication for repeat CT scan for now. I will follow her every 6 months and we will determine timing of reimaging based on her symptoms.

## 2016-04-27 NOTE — Patient Instructions (Signed)
We will arrange for you to complete your CPAP titration study. Please start omeprazole 20 mg daily. Continue this medication until our next visit. We will decide at that time whether you need to stay on it. We will not perform a CT scan of the chest at this time. We will follow-up to decide the appropriate timing.  Follow with Dr Lamonte Sakai in 6 months or sooner if you have any problems

## 2016-04-27 NOTE — Assessment & Plan Note (Signed)
She has not had her CPAP titration because the lab has told her that they don't want to perform while she has chest congestion. She's been turned away twice. I would like for her to get the study done regardless of her congestion and I will make this request.

## 2016-04-27 NOTE — Progress Notes (Signed)
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  She describes some rash that has affected her face and knees over the last few months. She has had some joint injections of cortisone. Has also use topical cortisone. The rash is red and bumpy. She has also had some progressive dyspnea over this time. No wheeze. Not on BD's.   Acute OV 09/05/15 -- this is an acute visit for patient with a history of sarcoidosis. Showed a normal spirometry in October 2016. Most recent CT scan of the chest was done on 03/01/15 that I personally reviewed. There was adenopathy but no evidence of active sarcoid, there was a stable left upper lobe granuloma. There was no interstitial lung disease. There was significant bilateral air trapping consistent with possible obstruction. She reports today that her exercise ability has decreased over the last 2 months- - she isn'tt sure whether this is deconditioning from decreased exercise. She also reports that she has URI sx and cough all through the winter months, was treated with abx x 3, received pred x 1. Her family has noted that she has to stop more frequently to rest. She is having some allergic drainage, minimal cough now.  No bouts of rash currently, but she did have some during the winter months. She is a heavy snorer and is ready now to get her polysomnogram which we had canceled before  ROV 11/17/15 -- Follow-up visit for history of sarcoidosis, Obesity with obstructive sleep apnea and obesity hypoventilation syndrome. She has probable associated obstructive lung disease with air trapping on chest imaging, Although repeat spirometry on 5/15 did not show any overt obstruction. She had a sleep study on 10/21/15 the results of which I have reviewed. This shows severe obstructive sleep apnea with AHI 31.5 per hour associated with hypoxemia on room air. Recommendation was for her to return  to the sleep lab to determine whether she needs CPAP versus BiPAP with associated submental oxygen.  She has had some rash on her hands. And her R knee. Her breathing continues to be limiting. Since last time it was discovered that she had black mold in her wall / floors that had to be repaired. She feels better not that the repair has been done.   ROV 04/27/16 -- patient has history of sarcoidosis, obesity with OSA/OHS, obstructive lung disease. She has made multiple attempts to do a CPAP titration study but has not been able to complete this due to significant nasal obstruction and congestion. She is not having a lot of nasal gtt now, appears to be moving air well.   She also notes that she's been having a sharp pain in her chest that started mid sternal to L breast, worst when supine, noticed in December. She has soreness L flank. She underwent a stress test 03/21/16 that showed normal heart function and was low risk for CAD. She is due for her mammogram. She took omeprazole 20mg  for 2 weeks felt some improvement. She has exertional SOB. No wheeze, no rash.   Exam:  Vitals:   04/27/16 1221  BP: (!) 146/92  Pulse: 63  SpO2: 93%  Weight: 292 lb 3.2 oz (132.5 kg)  Height: 5\' 6"  (1.676 m)    Gen: Pleasant, obese, in no distress,  normal affect, obese   ENT: No lesions,  mouth clear,  oropharynx clear, no postnasal drip  Neck: No JVD, no TMG, no carotid  bruits  Lungs: No use of accessory muscles, no dullness to percussion, coarse BS w/ no wheezing   Cardiovascular: RRR, heart sounds normal, no murmur or gallops, no peripheral edema  Musculoskeletal: No deformities, no cyanosis or clubbing  Neuro: alert, non focal  Skin: Warm,  LE and knees without rash.    03/01/15 --  COMPARISON: 01/26/2011 chest CT.  FINDINGS: Mediastinum/Nodes: Normal heart size. No pericardial fluid/thickening. There is atherosclerosis of the thoracic aorta, the great vessels of the mediastinum and the  coronary arteries, including calcified atherosclerotic plaque in the left main, left anterior descending, left circumflex and right coronary arteries. Great vessels are normal in course and caliber. Normal visualized thyroid. Normal esophagus. No axillary adenopathy. Stable mildly enlarged 1.0 cm right lower paratracheal node (series 2/ image 19). Stable coarse calcifications within a aortopulmonary window and left hilar nodes, in keeping with sarcoidosis. No new mediastinal or appreciable hilar adenopathy.  Lungs/Pleura: No pneumothorax. No pleural effusion. Stable subcentimeter calcified granuloma in the subpleural left upper lobe. No acute consolidative airspace disease, new significant pulmonary nodules or lung masses. No significant perilymphatic distribution nodularity. No emphysema. No significant regions of ground-glass attenuation, subpleural reticulation, traction bronchiectasis, parenchymal banding, architectural distortion or frank honeycombing. There is significant air trapping throughout both lungs on the expiration sequence.  Upper abdomen: Moderate diffuse hepatic steatosis.  Musculoskeletal: No aggressive appearing focal osseous lesions. Moderate degenerative changes in the thoracic spine.  IMPRESSION: 1. No evidence of active pulmonary sarcoidosis or other interstitial lung disease. Stable left upper lobe granuloma. 2. Significant air trapping throughout both lungs, indicating small airways disease. 3. Stable mild mediastinal and left hilar lymphadenopathy. 4. Left main and 3 vessel coronary atherosclerosis. 5. Moderate diffuse hepatic steatosis.    Atypical chest pain Based on her description, reassuring Myoview, response to omeprazole, I suspect that this is GERD. We will continue omeprazole daily and follow her symptoms.  SARCOIDOSIS, PULMONARY Appears to be stable at this time. No indication for repeat CT scan for now. I will follow her every 6  months and we will determine timing of reimaging based on her symptoms.  Obstructive sleep apnea She has not had her CPAP titration because the lab has told her that they don't want to perform while she has chest congestion. She's been turned away twice. I would like for her to get the study done regardless of her congestion and I will make this request.   Baltazar Apo, MD, PhD 04/27/2016, 12:43 PM [facility] [phone] or if no answer [phone]

## 2016-05-17 ENCOUNTER — Encounter: Payer: Self-pay | Admitting: Physician Assistant

## 2016-05-17 ENCOUNTER — Ambulatory Visit (INDEPENDENT_AMBULATORY_CARE_PROVIDER_SITE_OTHER): Payer: 59 | Admitting: Physician Assistant

## 2016-05-17 VITALS — BP 160/112 | HR 90 | Temp 98.6°F | Resp 16 | Ht 66.0 in | Wt 286.0 lb

## 2016-05-17 DIAGNOSIS — J22 Unspecified acute lower respiratory infection: Secondary | ICD-10-CM | POA: Diagnosis not present

## 2016-05-17 MED ORDER — ALBUTEROL SULFATE HFA 108 (90 BASE) MCG/ACT IN AERS: 2.0000 | INHALATION_SPRAY | RESPIRATORY_TRACT | 1 refills | Status: DC | PRN

## 2016-05-17 MED ORDER — HYDROCOD POLST-CPM POLST ER 10-8 MG/5ML PO SUER: 5.0000 mL | Freq: Every evening | ORAL | 0 refills | Status: DC | PRN

## 2016-05-17 MED ORDER — GUAIFENESIN ER 1200 MG PO TB12: 1.0000 | ORAL_TABLET | Freq: Two times a day (BID) | ORAL | 1 refills | Status: DC | PRN

## 2016-05-17 MED ORDER — AZITHROMYCIN 250 MG PO TABS: ORAL_TABLET | ORAL | 0 refills | Status: DC

## 2016-05-17 NOTE — Progress Notes (Signed)
[facility] [address], [place] [address] [phone]  Date:  05/17/2016   Name:  Rebecca Hicks   DOB:  [birth year]   MRN:  [id number]  PCP:  Zigmund Gottron, MD    History of Present Illness:  Chief Complaint  Patient presents with  . Cough    x 5 days    Rebecca Hicks is a 71 y.o. female patient who presents to [facility] for 5 days of cough.   --about 5 days ago, with cough and sore throat for a couple of days.  Symptoms appeared to resolve, but then 2 days ago it reappeared.  Coughing worse at night.  Congestion and pain with coughing.  Thick yellow sputum with cough.  She feels like there is difficulty with breathing.  No fever.  Sarcoidosis followed by pulmonologist with last visit 3 weeks ago, reported as stable.      Patient Active Problem List   Diagnosis Date Noted  . Atypical chest pain 03/28/2016  . Tremor of both hands 12/15/2015  . Ataxia 12/15/2015  . Obstructive sleep apnea 11/17/2015  . 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 Periodic limb movements of sleep 11/10/2015  . Obesity hypoventilation syndrome ([place]) 11/10/2015  . Need for hepatitis C screening test 10/13/2015  . Snoring 09/05/2015  . Diarrhea 01/21/2015  . Chronic kidney disease (CKD), stage II (mild) 03/05/2013  . COLONIC POLYPS, ADENOMATOUS 05/03/2009  . GLAUCOMA NOS 02/25/2009  . Diabetic neuropathy, type II diabetes mellitus ([place]) 05/28/2008  . LOW BACK PAIN SYNDROME 05/28/2008  . 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 DM (diabetes mellitus), type 2, uncontrolled ([place]) 02/07/2007  . Hyperlipidemia 02/07/2007  . Morbid obesity ([place]) 02/07/2007  . Essential hypertension 02/07/2007  . TB SKIN TEST, POSITIVE 02/07/2007  . TOBACCO USE, QUIT 02/07/2007  . HYSTERECTOMY, HX OF 02/07/2007    Past Medical History:  Diagnosis Date  . Arthritis   . Cataract   . Diabetes mellitus   . Glaucoma   . History of nuclear  stress test    Myoview 11/17: EF 64, Normal pharmacologic nuclear stress test with no evidence of prior infarct or ischemia.  . Hyperlipidemia   . Hypertension   . Morbid obesity ([place])   . Neuromuscular disorder ([place])   . Obstructive sleep apnea 11/17/2015  . Polyneuropathy in diabetes(357.2)   . Sarcoidosis [facility])     Past Surgical History:  Procedure Laterality Date  . ABDOMINAL HYSTERECTOMY    . APPENDECTOMY    . CATARACT EXTRACTION, BILATERAL  03/2010, 04/2010  . COLONOSCOPY  2011  . EYE SURGERY    . POLYPECTOMY  2011   polyps, hems   . SPINE SURGERY     lumbar laminectomy X 2  . TONSILLECTOMY      Social History  Substance Use Topics  . Smoking status: Former Smoker    Packs/day: 1.00    Years: 20.00    Types: Cigarettes    Quit date: 04/24/1995  . Smokeless tobacco: Never Used  . Alcohol use No    Family History  Problem Relation Age of Onset  . Heart disease Brother   . Cervical cancer Mother   . Stroke Father   . Alcohol abuse Father   . Alcohol abuse Brother   . Pancreatic cancer Maternal Uncle   . Hypertension      siblings  . Diabetes      siblings  . Breast cancer Maternal Aunt   .  Stroke Paternal Grandmother   . Colon cancer Neg Hx   . Esophageal cancer Neg Hx   . Rectal cancer Neg Hx   . Stomach cancer Neg Hx     Allergies  Allergen Reactions  . Penicillins Rash    Medication list has been reviewed and updated.  Current Outpatient Prescriptions on File Prior to Visit  Medication Sig Dispense Refill  . albuterol (PROVENTIL HFA;VENTOLIN HFA) 108 (90 Base) MCG/ACT inhaler Inhale 2 puffs into the lungs every 6 (six) hours as needed for wheezing or shortness of breath. 1 Inhaler 5  . amLODipine (NORVASC) 10 MG tablet Take 1 tablet by mouth  daily 90 tablet 3  . aspirin 81 MG tablet Take 81 mg by mouth daily.      . BD ULTRA-FINE PEN NEEDLES 29G X 12.7MM MISC USE TWO TIMES DAILY 180 each 3  . cloNIDine (CATAPRES) 0.1 MG tablet TAKE 1 TABLET BY  MOUTH 3  TIMES DAILY (Patient taking differently: TAKE 1 TABLET BY MOUTH 2 times daily) 270 tablet 3  . diphenoxylate-atropine (LOMOTIL) 2.5-0.025 MG tablet Take 1 tablet by mouth 4 (four) times daily as needed for diarrhea or loose stools. 120 tablet 6  . DULoxetine (CYMBALTA) 30 MG capsule TAKE 1 CAPSULE BY MOUTH  DAILY 90 capsule 3  . HUMULIN 70/30 KWIKPEN (70-30) 100 UNIT/ML PEN Inject 70 units every  morning &amp; 60 units before  evening meal. (Patient taking differently: Inject 60 units every  morning &amp; 60 units before  evening meal.) 120 mL 3  . metFORMIN (GLUCOPHAGE) 500 MG tablet Take 500 mg by mouth 2 (two) times daily with a meal.    . metoprolol (TOPROL-XL) 200 MG 24 hr tablet Take 0.5 tablets (100 mg total) by mouth daily.    Marland Kitchen omeprazole (PRILOSEC) 20 MG capsule Take 1 capsule (20 mg total) by mouth daily. 30 capsule 5  . ONE TOUCH ULTRA TEST test strip Test 3 times daily 100 each 12  . rosuvastatin (CRESTOR) 10 MG tablet Take 10 mg by mouth 3 (three) times a week.    . spironolactone (ALDACTONE) 25 MG tablet Take 1 tablet (25 mg total) by mouth daily. (Patient taking differently: Take 12.5 mg by mouth daily. ) 90 tablet 3  . SYMBICORT 160-4.5 MCG/ACT inhaler INHALE 2 PUFFS INTO THE  LUNGS TWO TIMES DAILY 30.6 g 2  . telmisartan-hydrochlorothiazide (MICARDIS HCT) 80-12.5 MG tablet Take 1 tablet by mouth  daily 90 tablet 3  . timolol (TIMOPTIC) 0.25 % ophthalmic solution Place 1 drop into both eyes daily. Per optho      No current facility-administered medications on file prior to visit.     ROS   Physical Examination: BP (!) 160/112 (BP Location: Left Arm, Patient Position: Sitting, Cuff Size: Large)   Pulse 90   Temp 98.6 F (37 C) (Oral)   Resp 16   Ht 5\' 6"  (1.676 m)   Wt 286 lb (129.7 kg)   SpO2 95%   BMI 46.16 kg/m  Ideal Body Weight: Weight in (lb) to have BMI = 25: 154.6  Physical Exam  Constitutional: She is oriented to person, place, and time. She appears  well-developed and well-nourished. No distress.  HENT:  Head: Normocephalic and atraumatic.  Right Ear: Tympanic membrane, external ear and ear canal normal.  Left Ear: Tympanic membrane, external ear and ear canal normal.  Nose: Mucosal edema and rhinorrhea present. Right sinus exhibits no maxillary sinus tenderness and no frontal sinus tenderness. Left sinus  exhibits no maxillary sinus tenderness and no frontal sinus tenderness.  Mouth/Throat: No uvula swelling. No oropharyngeal exudate, posterior oropharyngeal edema or posterior oropharyngeal erythema.  Eyes: Conjunctivae and EOM are normal. Pupils are equal, round, and reactive to light.  Cardiovascular: Normal rate and regular rhythm.  Exam reveals no gallop, no distant heart sounds and no friction rub.   No murmur heard. Pulmonary/Chest: Effort normal. No respiratory distress. She has no decreased breath sounds. She has no wheezes. She has no rhonchi.  Lymphadenopathy:       Head (right side): No submandibular, no tonsillar, no preauricular and no posterior auricular adenopathy present.       Head (left side): No submandibular, no tonsillar, no preauricular and no posterior auricular adenopathy present.  Neurological: She is alert and oriented to person, place, and time.  Skin: She is not diaphoretic.  Psychiatric: She has a normal mood and affect. Her behavior is normal.     Assessment and Plan: Rebecca Hicks is a 71 y.o. female who is here today for cc of cough Will treat for bacterial etiology.  Sarcoidosis appears to be well controlled in review. Advised of alarming sxs to warrant immediate follow up.   Lower respiratory infection (e.g., bronchitis, pneumonia, pneumonitis, pulmonitis) - Plan: azithromycin (ZITHROMAX) 250 MG tablet, albuterol (PROVENTIL HFA;VENTOLIN HFA) 108 (90 Base) MCG/ACT inhaler, chlorpheniramine-HYDROcodone (TUSSIONEX PENNKINETIC ER) 10-8 MG/5ML SUER, Guaifenesin (MUCINEX MAXIMUM STRENGTH) 1200 MG  TB12  Ivar Drape, PA-C [facility] 1/28/20185:48 PM

## 2016-05-17 NOTE — Patient Instructions (Addendum)
Please be extremely careful with taking the cough syrup.  It causes sedation.  Take as prescribed. I would like you to take the antibiotic as prescribed. Use the inhaler as you need. But it is fine to use every 6 hours for the first 24 hours.       IF you received an x-ray today, you will receive an invoice from [facility]. Please contact [facility] at [phone] with questions or concerns regarding your invoice.   IF you received labwork today, you will receive an invoice from [facility]. Please contact [facility] at [phone] with questions or concerns regarding your invoice.   Our billing staff will not be able to assist you with questions regarding bills from these companies.  You will be contacted with the lab results as soon as they are available. The fastest way to get your results is to activate your My Chart account. Instructions are located on the last page of this paperwork. If you have not heard from [place] regarding the results in 2 weeks, please contact this office.

## 2016-05-26 ENCOUNTER — Ambulatory Visit: Payer: 59

## 2016-06-12 ENCOUNTER — Ambulatory Visit (HOSPITAL_BASED_OUTPATIENT_CLINIC_OR_DEPARTMENT_OTHER): Payer: 59 | Attending: Emergency Medicine

## 2016-06-30 ENCOUNTER — Other Ambulatory Visit: Payer: Self-pay | Admitting: Family Medicine

## 2016-06-30 DIAGNOSIS — I10 Essential (primary) hypertension: Secondary | ICD-10-CM

## 2016-08-02 ENCOUNTER — Ambulatory Visit: Payer: 59 | Admitting: Family Medicine

## 2016-08-02 ENCOUNTER — Ambulatory Visit (HOSPITAL_BASED_OUTPATIENT_CLINIC_OR_DEPARTMENT_OTHER): Payer: 59 | Attending: Emergency Medicine | Admitting: Pulmonary Disease

## 2016-08-02 VITALS — Ht 66.0 in | Wt 305.0 lb

## 2016-08-02 DIAGNOSIS — J984 Other disorders of lung: Secondary | ICD-10-CM

## 2016-08-02 DIAGNOSIS — G4733 Obstructive sleep apnea (adult) (pediatric): Secondary | ICD-10-CM | POA: Diagnosis present

## 2016-08-02 DIAGNOSIS — D860 Sarcoidosis of lung: Secondary | ICD-10-CM

## 2016-08-02 DIAGNOSIS — G4736 Sleep related hypoventilation in conditions classified elsewhere: Secondary | ICD-10-CM

## 2016-08-08 DIAGNOSIS — G4736 Sleep related hypoventilation in conditions classified elsewhere: Secondary | ICD-10-CM | POA: Diagnosis not present

## 2016-08-08 DIAGNOSIS — G4733 Obstructive sleep apnea (adult) (pediatric): Secondary | ICD-10-CM

## 2016-08-08 DIAGNOSIS — J984 Other disorders of lung: Secondary | ICD-10-CM

## 2016-08-08 DIAGNOSIS — D860 Sarcoidosis of lung: Secondary | ICD-10-CM | POA: Diagnosis not present

## 2016-08-08 NOTE — Procedures (Signed)
   Patient Name: Rebecca Hicks, Rebecca Hicks Date: 08/02/2016 Gender: Female D.O.B: [birth year] Age (years): 70 Referring Provider: Baltazar Apo Height (inches): 87 Interpreting Physician: Chesley Mires MD, ABSM Weight (lbs): 294 RPSGT: Baxter Flattery BMI: 29 MRN: [id number] Neck Size: 15.50  CLINICAL INFORMATION The patient is referred for a CPAP titration to treat sleep apnea.  SLEEP STUDY TECHNIQUE As per the AASM Manual for the Scoring of Sleep and Associated Events v2.3 (April 2016) with a hypopnea requiring 4% desaturations.  The channels recorded and monitored were frontal, central and occipital EEG, electrooculogram (EOG), submentalis EMG (chin), nasal and oral airflow, thoracic and abdominal wall motion, anterior tibialis EMG, snore microphone, electrocardiogram, and pulse oximetry. Continuous positive airway pressure (CPAP) was initiated at the beginning of the study and titrated to treat sleep-disordered breathing.  MEDICATIONS Medications self-administered by patient taken the night of the study : N/A  TECHNICIAN COMMENTS Comments added by technician: Patient had difficulty initiating sleep.  Comments added by scorer: N/A  RESPIRATORY PARAMETERS Optimal PAP Pressure (cm): 10 AHI at Optimal Pressure (/hr): 4.0 Overall Minimal O2 (%): 76.00 Supine % at Optimal Pressure (%): 0 Minimal O2 at Optimal Pressure (%): 84.0       She had persistent oxygen desaturation for more than 5 minutes while on CPAP and not having other obstructive events.  This improved after she was placed on 3 liters oxygen.  SLEEP ARCHITECTURE The study was initiated at 10:33:05 PM and ended at 4:48:13 AM.  Sleep onset time was 9.1 minutes and the sleep efficiency was 79.2%. The total sleep time was 297.0 minutes.  The patient spent 9.09% of the night in stage N1 sleep, 79.80% in stage N2 sleep, 0.00% in stage N3 and 11.11% in REM.Stage REM latency was 46.0 minutes  Wake after sleep onset was 69.1.  Alpha intrusion was absent. Supine sleep was 22.05%.  CARDIAC DATA The 2 lead EKG demonstrated sinus rhythm. The mean heart rate was 52.21 beats per minute. Other EKG findings include: None.  LEG MOVEMENT DATA The total Periodic Limb Movements of Sleep (PLMS) were 0. The PLMS index was 0.00. A PLMS index of <15 is considered normal in adults.  IMPRESSIONS - The optimal PAP pressure was 10 cm of water. - She required 3 liters oxygen in addition to CPAP to maintain oxygen saturation.  DIAGNOSIS - Obstructive Sleep Apnea (G47.33) - Sleep Related Hypoventilation Due to Pulmonary Parenchymal Pathology (J98.4) - Pulmonary Sarcoidosis (D86.9)  RECOMMENDATIONS - Trial of CPAP therapy on 10 cm H2O and 3 liters oxygen. - She was fitted with a Small size Fisher[employer]Paykel Full Face Mask Simplus mask and heated humidification.  [Electronically signed] 08/08/2016 03:40 PM  Chesley Mires MD, [place], American Board of Sleep Medicine   NPI: [id number]

## 2016-08-19 ENCOUNTER — Encounter (HOSPITAL_BASED_OUTPATIENT_CLINIC_OR_DEPARTMENT_OTHER): Payer: 59

## 2016-08-22 ENCOUNTER — Ambulatory Visit (INDEPENDENT_AMBULATORY_CARE_PROVIDER_SITE_OTHER): Payer: 59 | Admitting: Family Medicine

## 2016-08-22 ENCOUNTER — Encounter: Payer: Self-pay | Admitting: Family Medicine

## 2016-08-22 VITALS — BP 138/82 | HR 62 | Temp 97.7°F | Ht 66.0 in | Wt 295.4 lb

## 2016-08-22 DIAGNOSIS — Z794 Long term (current) use of insulin: Secondary | ICD-10-CM | POA: Diagnosis not present

## 2016-08-22 DIAGNOSIS — G4736 Sleep related hypoventilation in conditions classified elsewhere: Secondary | ICD-10-CM

## 2016-08-22 DIAGNOSIS — G4733 Obstructive sleep apnea (adult) (pediatric): Secondary | ICD-10-CM

## 2016-08-22 DIAGNOSIS — D869 Sarcoidosis, unspecified: Secondary | ICD-10-CM | POA: Diagnosis not present

## 2016-08-22 DIAGNOSIS — N182 Chronic kidney disease, stage 2 (mild): Secondary | ICD-10-CM | POA: Diagnosis not present

## 2016-08-22 DIAGNOSIS — E1165 Type 2 diabetes mellitus with hyperglycemia: Secondary | ICD-10-CM

## 2016-08-22 DIAGNOSIS — E1100 Type 2 diabetes mellitus with hyperosmolarity without nonketotic hyperglycemic-hyperosmolar coma (NKHHC): Secondary | ICD-10-CM

## 2016-08-22 DIAGNOSIS — I10 Essential (primary) hypertension: Secondary | ICD-10-CM | POA: Diagnosis not present

## 2016-08-22 DIAGNOSIS — J984 Other disorders of lung: Secondary | ICD-10-CM | POA: Diagnosis not present

## 2016-08-22 DIAGNOSIS — E1140 Type 2 diabetes mellitus with diabetic neuropathy, unspecified: Secondary | ICD-10-CM | POA: Diagnosis not present

## 2016-08-22 DIAGNOSIS — IMO0002 Reserved for concepts with insufficient information to code with codable children: Secondary | ICD-10-CM

## 2016-08-22 LAB — POCT GLYCOSYLATED HEMOGLOBIN (HGB A1C): Hemoglobin A1C: 9.4

## 2016-08-22 MED ORDER — INSULIN ISOPHANE & REGULAR (HUMAN 70-30)100 UNIT/ML KWIKPEN: PEN_INJECTOR | SUBCUTANEOUS | 3 refills | Status: DC

## 2016-08-22 NOTE — Progress Notes (Signed)
   Subjective:    Patient ID: Rebecca Hicks, female    DOB: [birth year], 71 y.o.   MRN: [id number]  HPI First visit in a while for patient with multiple medical problems.  Issues: 1. Main one for me is DM with A1C today = 9.1.  States she has high blood sugars during the day and at night they often bottom out.  Current insulin is 70/30 70 units qam and 60 units qpm. 2. HBP.  No complaints on current meds.   3. Sleep apnea.  Patient had recent sleep study ordered by pulm.  She had not heard results.  I reviewed and she should be on Nightime CPAP.  She still plans to call pulm office and arrange.  I offered to get a home health agency to set up if her primary plan fails. 4. General fatigue.  No real DOE.  Does have nighttime cough.  No other focal symptoms.  She is due for a pulm visit to review status of her known sarcoidosis.   5. Needs diabetic eye appointment.  She has it made.  It just so happens that she sees my new daughter-in-law.  I know I will get results communicated.      Review of Systems     Objective:   Physical Exam VS reviewed.  Wt is down a bit. Lungs clear Cardiac RRR without m or g Ext trace edema.        Assessment & Plan:

## 2016-08-22 NOTE — Assessment & Plan Note (Addendum)
Check labs, good control.

## 2016-08-22 NOTE — Assessment & Plan Note (Signed)
Check labs 

## 2016-08-22 NOTE — Assessment & Plan Note (Signed)
Nice progress on wt loss.

## 2016-08-22 NOTE — Patient Instructions (Addendum)
For now, we will continue using the 70/30 insulin.  And you can adjust your doses some  The morning insulin covers you up to your evening meal. If your morning and/or afternoon blood sugars are too high, you need more morning insulin.  If low, you need less.  The evening insulin is best taken 30 minutes before your evening meal.  It covers evening, night and blood sugar upon awakening. Again, if high, you need more.  If low, you need less.  This is why I want you to increase your morning dose to 80 units and decrease your evening dose to 50 units.  We can continue adjustments from there.  If this really doesn't work, we will need to switch to the more expensive, four shot a day insulin regimen.   Great job.  You lost 10 lbs.   I will call with blood test results. Please see Drs. Bynum and [name]. See me in three months.

## 2016-08-22 NOTE — Assessment & Plan Note (Signed)
Doubt active or causing symptoms.  Keep FU with Dr. Malvin Johns

## 2016-08-22 NOTE — Assessment & Plan Note (Signed)
Informed that she needs night CPAP

## 2016-08-23 LAB — CBC
Hematocrit: 40.6 % (ref 34.0–46.6)
Hemoglobin: 13 g/dL (ref 11.1–15.9)
MCH: 28.3 pg (ref 26.6–33.0)
MCHC: 32 g/dL (ref 31.5–35.7)
MCV: 89 fL (ref 79–97)
Platelets: 190 10*3/uL (ref 150–379)
RBC: 4.59 x10E6/uL (ref 3.77–5.28)
RDW: 15 % (ref 12.3–15.4)
WBC: 7.7 10*3/uL (ref 3.4–10.8)

## 2016-08-23 LAB — CMP14+EGFR
ALK PHOS: 78 IU/L (ref 39–117)
ALT: 15 IU/L (ref 0–32)
AST: 18 IU/L (ref 0–40)
Albumin/Globulin Ratio: 1.3 (ref 1.2–2.2)
Albumin: 3.7 g/dL (ref 3.6–4.8)
BUN/Creatinine Ratio: 27 (ref 12–28)
BUN: 29 mg/dL — ABNORMAL HIGH (ref 8–27)
Bilirubin Total: 0.2 mg/dL (ref 0.0–1.2)
CO2: 23 mmol/L (ref 18–29)
CREATININE: 1.08 mg/dL — AB (ref 0.57–1.00)
Calcium: 9.2 mg/dL (ref 8.7–10.3)
Chloride: 102 mmol/L (ref 96–106)
GFR calc Af Amer: 61 mL/min/{1.73_m2} (ref >59)
GFR calc non Af Amer: 53 mL/min/{1.73_m2} — ABNORMAL LOW (ref >59)
Globulin, Total: 2.8 g/dL (ref 1.5–4.5)
Glucose: 92 mg/dL (ref 65–99)
Potassium: 4.8 mmol/L (ref 3.5–5.2)
Sodium: 139 mmol/L (ref 134–144)
Total Protein: 6.5 g/dL (ref 6.0–8.5)

## 2016-08-23 LAB — TSH: TSH: 3.14 u[IU]/mL (ref 0.450–4.500)

## 2016-08-24 ENCOUNTER — Telehealth: Payer: Self-pay | Admitting: Emergency Medicine

## 2016-08-24 DIAGNOSIS — G4733 Obstructive sleep apnea (adult) (pediatric): Secondary | ICD-10-CM

## 2016-08-24 NOTE — Telephone Encounter (Signed)
Called and spoke with pt and she stated that she is still waiting to be set up with cpap with a DME company.  RB please advise. Thanks  Allergies  Allergen Reactions  . Penicillins Rash

## 2016-08-27 NOTE — Telephone Encounter (Signed)
Order has been placed. Pt is aware and voiced her understanding. Nothing further needed.

## 2016-08-27 NOTE — Telephone Encounter (Signed)
Please order CPAP 10 cm h2O + O2 bled in at 3L/min. Heated humidity.  Small size Fisher[employer]Paykel Full Face Mask Simplus mask and heated humidification

## 2016-08-30 ENCOUNTER — Encounter: Payer: Self-pay | Admitting: Emergency Medicine

## 2016-08-30 ENCOUNTER — Ambulatory Visit (INDEPENDENT_AMBULATORY_CARE_PROVIDER_SITE_OTHER): Payer: 59 | Admitting: Emergency Medicine

## 2016-08-30 DIAGNOSIS — D869 Sarcoidosis, unspecified: Secondary | ICD-10-CM

## 2016-08-30 DIAGNOSIS — G4733 Obstructive sleep apnea (adult) (pediatric): Secondary | ICD-10-CM

## 2016-08-30 MED ORDER — PREDNISONE 10 MG PO TABS: 10.0000 mg | ORAL_TABLET | Freq: Every day | ORAL | 0 refills | Status: DC

## 2016-08-30 NOTE — Progress Notes (Signed)
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  She describes some rash that has affected her face and knees over the last few months. She has had some joint injections of cortisone. Has also use topical cortisone. The rash is red and bumpy. She has also had some progressive dyspnea over this time. No wheeze. Not on BD's.   Acute OV 09/05/15 -- this is an acute visit for patient with a history of sarcoidosis. Showed a normal spirometry in October 2016. Most recent CT scan of the chest was done on 03/01/15 that I personally reviewed. There was adenopathy but no evidence of active sarcoid, there was a stable left upper lobe granuloma. There was no interstitial lung disease. There was significant bilateral air trapping consistent with possible obstruction. She reports today that her exercise ability has decreased over the last 2 months- - she isn'tt sure whether this is deconditioning from decreased exercise. She also reports that she has URI sx and cough all through the winter months, was treated with abx x 3, received pred x 1. Her family has noted that she has to stop more frequently to rest. She is having some allergic drainage, minimal cough now.  No bouts of rash currently, but she did have some during the winter months. She is a heavy snorer and is ready now to get her polysomnogram which we had canceled before  ROV 11/17/15 -- Follow-up visit for history of sarcoidosis, Obesity with obstructive sleep apnea and obesity hypoventilation syndrome. She has probable associated obstructive lung disease with air trapping on chest imaging, Although repeat spirometry on 5/15 did not show any overt obstruction. She had a sleep study on 10/21/15 the results of which I have reviewed. This shows severe obstructive sleep apnea with AHI 31.5 per hour associated with hypoxemia on room air. Recommendation was for her to return  to the sleep lab to determine whether she needs CPAP versus BiPAP with associated submental oxygen.  She has had some rash on her hands. And her R knee. Her breathing continues to be limiting. Since last time it was discovered that she had black mold in her wall / floors that had to be repaired. She feels better not that the repair has been done.   ROV 04/27/16 -- patient has history of sarcoidosis, obesity with OSA/OHS, obstructive lung disease. She has made multiple attempts to do a CPAP titration study but has not been able to complete this due to significant nasal obstruction and congestion. She is not having a lot of nasal gtt now, appears to be moving air well.   She also notes that she's been having a sharp pain in her chest that started mid sternal to L breast, worst when supine, noticed in December. She has soreness L flank. She underwent a stress test 03/21/16 that showed normal heart function and was low risk for CAD. She is due for her mammogram. She took omeprazole 20mg  for 2 weeks felt some improvement. She has exertional SOB. No wheeze, no rash.  ROV 08/30/16 -- this is a follow-up visit for patient with a history of obesity, OSA/OHS, sarcoidosis with associated obstructive lung disease. Also with chronic rhinitis. CPAP re-titration was performed since last visit. Equipment ordered, CPAP 10 cm water. CT scan of the chest was in November 2016. She is still having problems with nasal gtt and congestion. She has been having freq and recurrent cough. She  is on zyrtec. She is coughing up yellow mucous. She has noticed some rash on her legs and back - dark brown.   Exam:  Vitals:   08/30/16 0923  BP: (!) 144/64  Pulse: (!) 54  SpO2: 98%  Weight: 294 lb (133.4 kg)  Height: 5\' 6"  (1.676 m)    Gen: Pleasant, obese, in no distress,  normal affect, obese   ENT: No lesions,  mouth clear,  oropharynx clear, no postnasal drip  Neck: No JVD, no Stridor  Lungs: No use of accessory muscles,  somewhat distant, no crackles or wheezing  Cardiovascular: RRR, heart sounds normal, no murmur or gallops, no peripheral edema  Musculoskeletal: No deformities, no cyanosis or clubbing  Neuro: alert, non focal  Skin: She has a pale brown macular rash on her lower ext and shoulders especially on the right   03/01/15 --  COMPARISON: 01/26/2011 chest CT.  FINDINGS: Mediastinum/Nodes: Normal heart size. No pericardial fluid/thickening. There is atherosclerosis of the thoracic aorta, the great vessels of the mediastinum and the coronary arteries, including calcified atherosclerotic plaque in the left main, left anterior descending, left circumflex and right coronary arteries. Great vessels are normal in course and caliber. Normal visualized thyroid. Normal esophagus. No axillary adenopathy. Stable mildly enlarged 1.0 cm right lower paratracheal node (series 2/ image 19). Stable coarse calcifications within a aortopulmonary window and left hilar nodes, in keeping with sarcoidosis. No new mediastinal or appreciable hilar adenopathy.  Lungs/Pleura: No pneumothorax. No pleural effusion. Stable subcentimeter calcified granuloma in the subpleural left upper lobe. No acute consolidative airspace disease, new significant pulmonary nodules or lung masses. No significant perilymphatic distribution nodularity. No emphysema. No significant regions of ground-glass attenuation, subpleural reticulation, traction bronchiectasis, parenchymal banding, architectural distortion or frank honeycombing. There is significant air trapping throughout both lungs on the expiration sequence.  Upper abdomen: Moderate diffuse hepatic steatosis.  Musculoskeletal: No aggressive appearing focal osseous lesions. Moderate degenerative changes in the thoracic spine.  IMPRESSION: 1. No evidence of active pulmonary sarcoidosis or other interstitial lung disease. Stable left upper lobe granuloma. 2. Significant  air trapping throughout both lungs, indicating small airways disease. 3. Stable mild mediastinal and left hilar lymphadenopathy. 4. Left main and 3 vessel coronary atherosclerosis. 5. Moderate diffuse hepatic steatosis.    SARCOIDOSIS, PULMONARY Suspect that her symptoms do relate to some increased activity of her sarcoidosis-rash, increased sputum production, increased vasomotor rhinitis. I believe she will need to be treated with a prednisone burst for possibly 3 weeks. I will check her CT scan of the chest before she gets treated with prednisone to look for interval increase in her lymphadenopathy, interstitial disease.  We will repeat your CT scan of the chest to follow sarcoidosis.  Please continue your Symbicort twice a day Keep albuterol available to use as needed Start prednisone 30mg  daily for the next 3 weeks. Communicate with Dr Andria Frames so he knows that this medication may be influencing your diabetes.  Follow with Dr Lamonte Sakai in 1 month, sooner if any problem.   Obstructive sleep apnea CPAP has been ordered, she has not started it yet but is enthusiastic to do so. CPAP 10 cm H2O plus oxygen 3 L/m  Baltazar Apo, MD, PhD 08/30/2016, 9:44 AM [facility] [phone] or if no answer [phone]

## 2016-08-30 NOTE — Patient Instructions (Signed)
We will repeat your CT scan of the chest to follow sarcoidosis.  Please continue your Symbicort twice a day Keep albuterol available to use as needed Continue your zyrtec daily Start prednisone 30mg  daily for the next 3 weeks. Communicate with Dr Andria Frames so he knows that this medication may be influencing your diabetes.  Start CPAP when you get your equipment Follow with Dr Lamonte Sakai in 1 month, sooner if any problem.

## 2016-08-30 NOTE — Assessment & Plan Note (Addendum)
Suspect that her symptoms do relate to some increased activity of her sarcoidosis-rash, increased sputum production, increased vasomotor rhinitis. I believe she will need to be treated with a prednisone burst for possibly 3 weeks. I will check her CT scan of the chest before she gets treated with prednisone to look for interval increase in her lymphadenopathy, interstitial disease.  We will repeat your CT scan of the chest to follow sarcoidosis.  Please continue your Symbicort twice a day Keep albuterol available to use as needed Start prednisone 30mg  daily for the next 3 weeks. Communicate with Dr Andria Frames so he knows that this medication may be influencing your diabetes.  Follow with Dr Lamonte Sakai in 1 month, sooner if any problem.

## 2016-08-30 NOTE — Assessment & Plan Note (Signed)
CPAP has been ordered, she has not started it yet but is enthusiastic to do so. CPAP 10 cm H2O plus oxygen 3 L/m

## 2016-09-05 ENCOUNTER — Ambulatory Visit (INDEPENDENT_AMBULATORY_CARE_PROVIDER_SITE_OTHER)
Admission: RE | Admit: 2016-09-05 | Discharge: 2016-09-05 | Disposition: A | Discharge status: Home / Self Care | Payer: 59 | Source: Ambulatory Visit | Attending: Emergency Medicine | Admitting: Emergency Medicine

## 2016-09-05 DIAGNOSIS — D869 Sarcoidosis, unspecified: Secondary | ICD-10-CM | POA: Diagnosis not present

## 2016-09-12 ENCOUNTER — Telehealth: Payer: Self-pay | Admitting: Emergency Medicine

## 2016-09-12 NOTE — Telephone Encounter (Signed)
Called [facility] & spoke to [place].  He states they received the order, ran it thru pt's insurance and called her on 5/9 & left her a vm to call them back.  She hasn't returned their call.  I called the pt & told her [facility] called her on 5/9 & left vm.  Gave her their phone # to call them back.  Nothing further needed.

## 2016-09-12 NOTE — Telephone Encounter (Signed)
Referral placed to [facility] on 5/7 for cpap machine.  PCC's please advise on order.  Thanks!

## 2016-09-14 ENCOUNTER — Telehealth: Payer: Self-pay | Admitting: Emergency Medicine

## 2016-09-14 NOTE — Telephone Encounter (Signed)
Nothing to support sarcoid or any other specific disease causing her symptoms so nothing to suggest different from what she was instructed pending final review by Dr Lamonte Sakai

## 2016-09-14 NOTE — Telephone Encounter (Signed)
Pt is calling for results of her CT scan done on 5/16.  RB is on vacation.  MW please advise of results for this pt.  Thanks  Allergies  Allergen Reactions  . Penicillins Rash

## 2016-09-14 NOTE — Telephone Encounter (Signed)
lmtcb x1 for pt. 

## 2016-09-14 NOTE — Telephone Encounter (Signed)
Called and spoke with pt and she is aware of MW recs.  She is aware that RB is out of the office at this time, but we will forward to [place] for any further recs upon his return.

## 2016-09-14 NOTE — Telephone Encounter (Signed)
Patient returning call, CB is [phone]

## 2016-09-16 ENCOUNTER — Other Ambulatory Visit: Payer: Self-pay | Admitting: Family Medicine

## 2016-09-18 NOTE — Telephone Encounter (Signed)
RB please advise if any further information needs to be given to the pt.  thanks

## 2016-09-24 NOTE — Telephone Encounter (Signed)
RB please advise. Thanks.  

## 2016-09-25 NOTE — Telephone Encounter (Signed)
LM x 1 for pt 

## 2016-09-25 NOTE — Telephone Encounter (Signed)
Please let her know that I have reviewed her CT scan of the chest. No new recommendations with regard to her inhaled medication, please continue same. No evidence of progressive sarcoidosis.

## 2016-09-25 NOTE — Telephone Encounter (Signed)
The was some evidence for coronary artery disease (severity cannot be determined by CAT scan). Based on this ablation he to discuss it further with her cardiologist at their next visit.

## 2016-09-26 NOTE — Telephone Encounter (Signed)
lmomtcb x 2 for the pt.  

## 2016-09-27 NOTE — Telephone Encounter (Signed)
Called and spoke with pt and she is aware of ct results per RB.

## 2016-09-28 ENCOUNTER — Encounter: Payer: Self-pay | Admitting: Cardiovascular Disease

## 2016-09-28 LAB — HM DIABETES EYE EXAM

## 2016-10-15 ENCOUNTER — Ambulatory Visit: Payer: 59 | Admitting: Emergency Medicine

## 2016-10-31 ENCOUNTER — Ambulatory Visit (INDEPENDENT_AMBULATORY_CARE_PROVIDER_SITE_OTHER): Payer: 59 | Admitting: Family Medicine

## 2016-10-31 ENCOUNTER — Encounter: Payer: Self-pay | Admitting: Family Medicine

## 2016-10-31 DIAGNOSIS — R200 Anesthesia of skin: Secondary | ICD-10-CM

## 2016-10-31 DIAGNOSIS — R490 Dysphonia: Secondary | ICD-10-CM

## 2016-10-31 HISTORY — DX: Dysphonia: R49.0

## 2016-10-31 NOTE — Patient Instructions (Signed)
Buy a wrist splint to use as see fit.  Recommend at night and with computer use.   Google carpal tunnel syndrome.  That is my most likely diagnosis, but it is not a perfect fit. You should hear from [place] for two more things. 1. An ear nose and throat referral for the hoarseness. 2. Nerve conduction tests to find out the arm numbness diagnosis for sure.   I will call with results Great work on the blood pressure. Happy birthday.

## 2016-11-01 NOTE — Assessment & Plan Note (Signed)
Sounds low risk but will follow the rule of thumb, Refer for ENT eval for hoarseness in an adult lasting >6 weeks.

## 2016-11-01 NOTE — Progress Notes (Signed)
   Subjective:    Patient ID: Rebecca Hicks, female    DOB: October 29, 1945, 71 y.o.   MRN: [id number]  HPI Two issues: Left hand and arm tingling/numbness.  Slow onset.  Symptoms worse in hand but extend back to shoulder.  No weakness noted.  Does have aching pain.  Worse at night.  Does a lot of computer typing at work.  No trauma.  Denies current or prior neck problems.  No other extremities affected.  Hoarseness x 6 weeks.  No preceding URI.  Mild allergies, not bad.  Denies heartburn or other reflux related symptoms.  Not on PPI.  Former smoker, quit ~25 years ago.  Strongly positive family hx of cancer.    Review of Systems     Objective:   Physical Exam  Lungs clear  Cardiac RRR without m or g Neck, no sig nodes. Left arm, normal strength.  Carpal tunnel compression reproduces elbow pain.         Assessment & Plan:

## 2016-11-01 NOTE — Assessment & Plan Note (Signed)
Likely carpal tunnel syndrome, but has enough atypical elements that I want to confirm with nerve conduction studies.  Treat for now with wrist splint.

## 2016-11-13 ENCOUNTER — Other Ambulatory Visit: Payer: Self-pay | Admitting: Family Medicine

## 2016-11-23 ENCOUNTER — Ambulatory Visit: Payer: 59 | Admitting: Emergency Medicine

## 2016-11-24 ENCOUNTER — Ambulatory Visit (INDEPENDENT_AMBULATORY_CARE_PROVIDER_SITE_OTHER): Payer: 59

## 2016-11-24 ENCOUNTER — Encounter: Payer: Self-pay | Admitting: Emergency Medicine

## 2016-11-24 ENCOUNTER — Ambulatory Visit (INDEPENDENT_AMBULATORY_CARE_PROVIDER_SITE_OTHER): Payer: 59 | Admitting: Emergency Medicine

## 2016-11-24 VITALS — BP 130/72 | HR 69 | Temp 98.0°F | Resp 18 | Ht 66.14 in | Wt 303.0 lb

## 2016-11-24 DIAGNOSIS — M542 Cervicalgia: Secondary | ICD-10-CM | POA: Insufficient documentation

## 2016-11-24 DIAGNOSIS — M5030 Other cervical disc degeneration, unspecified cervical region: Secondary | ICD-10-CM

## 2016-11-24 DIAGNOSIS — M129 Arthropathy, unspecified: Secondary | ICD-10-CM | POA: Insufficient documentation

## 2016-11-24 DIAGNOSIS — H9201 Otalgia, right ear: Secondary | ICD-10-CM | POA: Diagnosis not present

## 2016-11-24 HISTORY — DX: Cervicalgia: M54.2

## 2016-11-24 HISTORY — DX: Other cervical disc degeneration, unspecified cervical region: M50.30

## 2016-11-24 HISTORY — DX: Otalgia, right ear: H92.01

## 2016-11-24 HISTORY — DX: Arthropathy, unspecified: M12.9

## 2016-11-24 MED ORDER — DICLOFENAC-MISOPROSTOL 50-0.2 MG PO TBEC: 1.0000 | DELAYED_RELEASE_TABLET | Freq: Two times a day (BID) | ORAL | 1 refills | Status: AC

## 2016-11-24 MED ORDER — HYDROCORTISONE-ACETIC ACID 1-2 % OT SOLN: 3.0000 [drp] | Freq: Three times a day (TID) | OTIC | 0 refills | Status: DC

## 2016-11-24 NOTE — Progress Notes (Signed)
Rebecca Hicks 71 y.o.   Chief Complaint  Patient presents with  . Neck Pain     with stiffness more on the right side  that moves down into shoulder x 1 week   . Ear Pain    last few days     HISTORY OF PRESENT ILLNESS: This is a 71 y.o. female complaining of right sided neck pain that radiates to right upper arm x 1 week; denies trauma. Denies numbness, tingling, or dysfunction of arm; also c/o pain to right ear, is a frequent Qtip user. No other significant symptoms. Has h/o arthritis; records reviewed.  HPI   Prior to Admission medications   Medication Sig Start Date End Date Taking? Authorizing Provider  albuterol (PROVENTIL HFA;VENTOLIN HFA) 108 (90 Base) MCG/ACT inhaler Inhale 2 puffs into the lungs every 4 (four) hours as needed for wheezing or shortness of breath (cough, shortness of breath or wheezing.). 05/17/16  Yes Ivar Drape D, PA  amLODipine (NORVASC) 10 MG tablet Take 1 tablet by mouth  daily 12/05/15  Yes [name], Jamal Collin, MD  aspirin 81 MG tablet Take 81 mg by mouth daily.     Yes [provider]  BD ULTRA-FINE PEN NEEDLES 29G X 12.7MM MISC USE TWO TIMES DAILY 03/05/16  Yes [name], Jamal Collin, MD  cloNIDine (CATAPRES) 0.1 MG tablet TAKE 1 TABLET BY MOUTH 3  TIMES DAILY Patient taking differently: TAKE 1 TABLET BY MOUTH 2 times daily 02/28/16  Yes [name], Jamal Collin, MD  diphenoxylate-atropine (LOMOTIL) 2.5-0.025 MG tablet Take 1 tablet by mouth 4 (four) times daily as needed for diarrhea or loose stools. 10/13/15  Yes [name], Jamal Collin, MD  DULoxetine (CYMBALTA) 30 MG capsule TAKE 1 CAPSULE BY MOUTH  DAILY 03/05/16  Yes [name], Jamal Collin, MD  Insulin Isophane & Regular Human (HUMULIN 70/30 KWIKPEN) (70-30) 100 UNIT/ML PEN 80 units every morning and 50 units before evening meal. 08/22/16  Yes [name], Jamal Collin, MD  metFORMIN (GLUCOPHAGE) 500 MG tablet Take 500 mg by mouth 2 (two) times daily with a meal.   Yes [provider]  metoprolol  (TOPROL-XL) 200 MG 24 hr tablet Take 0.5 tablets (100 mg total) by mouth daily. 04/08/15  Yes [name], Jamal Collin, MD  omeprazole (PRILOSEC) 20 MG capsule Take 1 capsule (20 mg total) by mouth daily. 04/27/16  Yes Collene Gobble, MD  ONE TOUCH ULTRA TEST test strip USE TO TEST BLOOD SUGAR 3  TIMES DAILY 11/14/16  Yes [name], Jamal Collin, MD  rosuvastatin (CRESTOR) 10 MG tablet Take 10 mg by mouth 3 (three) times a week.   Yes [provider]  spironolactone (ALDACTONE) 25 MG tablet TAKE 1 TABLET BY MOUTH  DAILY 07/02/16  Yes [name], Jamal Collin, MD  SYMBICORT 160-4.5 MCG/ACT inhaler INHALE 2 PUFFS INTO THE  LUNGS TWO TIMES DAILY 04/25/16  Yes Collene Gobble, MD  telmisartan-hydrochlorothiazide (MICARDIS HCT) 80-12.5 MG tablet TAKE 1 TABLET BY MOUTH  DAILY 09/18/16  Yes [name], Jamal Collin, MD  timolol (TIMOPTIC) 0.25 % ophthalmic solution Place 1 drop into both eyes daily. Per optho    Yes [provider]    Allergies  Allergen Reactions  . Penicillins Rash    Patient Active Problem List   Diagnosis Date Noted  . Left arm numbness 10/31/2016  . Hoarseness 10/31/2016  . Atypical chest pain 03/28/2016  . Tremor of both hands 12/15/2015  . Ataxia 12/15/2015  . Obstructive sleep apnea 11/17/2015  . Sleep-related hypoventilation due  to pulmonary parenchymal pathology 11/10/2015  . Periodic limb movements of sleep 11/10/2015  . Obesity hypoventilation syndrome ([place]) 11/10/2015  . Diarrhea 01/21/2015  . Chronic kidney disease (CKD), stage II (mild) 03/05/2013  . COLONIC POLYPS, ADENOMATOUS 05/03/2009  . GLAUCOMA NOS 02/25/2009  . Diabetic neuropathy, type II diabetes mellitus ([place]) 05/28/2008  . LOW BACK PAIN SYNDROME 05/28/2008  . 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 DM (diabetes mellitus), type 2, uncontrolled ([place]) 02/07/2007  . Hyperlipidemia 02/07/2007  . Morbid obesity ([employer]) 02/07/2007  . Essential hypertension 02/07/2007  . TB SKIN TEST, POSITIVE 02/07/2007  . TOBACCO USE,  QUIT 02/07/2007  . HYSTERECTOMY, HX OF 02/07/2007    Past Medical History:  Diagnosis Date  . Arthritis   . Cataract   . Diabetes mellitus   . Glaucoma   . History of nuclear stress test    Myoview 11/17: EF 64, Normal pharmacologic nuclear stress test with no evidence of prior infarct or ischemia.  . Hyperlipidemia   . Hypertension   . Morbid obesity ([place])   . Neuromuscular disorder ([place])   . Obstructive sleep apnea 11/17/2015  . Polyneuropathy in diabetes(357.2)   . Sarcoidosis     Past Surgical History:  Procedure Laterality Date  . ABDOMINAL HYSTERECTOMY    . APPENDECTOMY    . CATARACT EXTRACTION, BILATERAL  03/2010, 04/2010  . COLONOSCOPY  2011  . EYE SURGERY    . POLYPECTOMY  2011   polyps, hems   . SPINE SURGERY     lumbar laminectomy X 2  . TONSILLECTOMY      Social History   Social History  . Marital status: Single    Spouse name: N/A  . Number of children: 0  . Years of education: college   Occupational History  . Manager Unemployed   Social History Main Topics  . Smoking status: Former Smoker    Packs/day: 1.00    Years: 20.00    Types: Cigarettes    Quit date: 04/24/1995  . Smokeless tobacco: Never Used  . Alcohol use No  . Drug use: No  . Sexual activity: Not on file   Other Topics Concern  . Not on file   Social History Narrative  . No narrative on file    Family History  Problem Relation Age of Onset  . Heart disease Brother   . Cervical cancer Mother   . Stroke Father   . Alcohol abuse Father   . Alcohol abuse Brother   . Pancreatic cancer Maternal Uncle   . Hypertension Unknown        siblings  . Diabetes Unknown        siblings  . Breast cancer Maternal Aunt   . Stroke Paternal Grandmother   . Colon cancer Neg Hx   . Esophageal cancer Neg Hx   . Rectal cancer Neg Hx   . Stomach cancer Neg Hx      Review of Systems  Constitutional: Negative.  Negative for chills, fever and weight loss.  HENT: Positive for ear pain.  Negative for ear discharge, nosebleeds and sore throat.   Eyes: Negative.  Negative for discharge and redness.  Respiratory: Negative for cough and shortness of breath.   Cardiovascular: Negative.  Negative for chest pain and palpitations.  Gastrointestinal: Negative for abdominal pain, diarrhea, nausea and vomiting.  Genitourinary: Negative.  Negative for dysuria and hematuria.  Musculoskeletal: Positive for neck pain.  Skin: Negative.  Negative for rash.  Neurological: Negative.  Negative for dizziness and  headaches.  Endo/Heme/Allergies: Negative.   All other systems reviewed and are negative.    Vitals:   11/24/16 0842  BP: 130/72  Pulse: 69  Resp: 18  Temp: 98 F (36.7 C)     Physical Exam  Constitutional: She is oriented to person, place, and time. She appears well-developed and well-nourished.  HENT:  Head: Normocephalic and atraumatic.  Mouth/Throat: Oropharynx is clear and moist.  Hyperemic right canal, TM normal  Eyes: Pupils are equal, round, and reactive to light. Conjunctivae and EOM are normal.  Neck: Muscular tenderness present. No spinous process tenderness present.    Cardiovascular: Normal rate, regular rhythm and normal heart sounds.   Pulmonary/Chest: Effort normal and breath sounds normal.  Musculoskeletal: Normal range of motion.  RUE: non-tender, FROM, NVI, no bruising or erythema.  Neurological: She is alert and oriented to person, place, and time. No sensory deficit. She exhibits normal muscle tone.  Skin: Skin is warm and dry. Capillary refill takes less than 2 seconds.  Psychiatric: She has a normal mood and affect. Her behavior is normal.  Vitals reviewed.  Dg Cervical Spine 2 Or 3 Views  Result Date: 11/24/2016 CLINICAL DATA:  Neck pain for 1 week. No known injury. Initial encounter. EXAM: CERVICAL SPINE - 2-3 VIEW COMPARISON:  10/01/2015 CT scout film FINDINGS: No acute fracture or subluxation identified. Mild multilevel degenerative disc  disease and facet arthropathy noted. No focal bony lesions are present. IMPRESSION: No acute abnormality. Mild multilevel degenerative changes. Electronically Signed   By: Margarette Canada M.D.   On: 11/24/2016 09:54     ASSESSMENT & PLAN: Lucee was seen today for neck pain and ear pain.  Diagnoses and all orders for this visit:  Neck pain -     DG Cervical Spine 2 or 3 views; Future -     Diclofenac-Misoprostol 50-0.2 MG TBEC; Take 1 tablet by mouth 2 (two) times daily.  Otalgia of right ear -     acetic acid-hydrocortisone (VOSOL-HC) OTIC solution; Place 3 drops into the right ear 3 (three) times daily.  DDD (degenerative disc disease), cervical  Arthropathy Comments: cervical spine    Patient Instructions       IF you received an x-ray today, you will receive an invoice from [facility]. Please contact [facility] at [phone] with questions or concerns regarding your invoice.   IF you received labwork today, you will receive an invoice from [facility]. Please contact [facility] at [phone] with questions or concerns regarding your invoice.   Our billing staff will not be able to assist you with questions regarding bills from these companies.  You will be contacted with the lab results as soon as they are available. The fastest way to get your results is to activate your My Chart account. Instructions are located on the last page of this paperwork. If you have not heard from [place] regarding the results in 2 weeks, please contact this office.      Cervical Radiculopathy Cervical radiculopathy means that a nerve in the neck is pinched or bruised. This can cause pain or loss of feeling (numbness) that runs from your neck to your arm and fingers. Follow these instructions at home: Managing pain  Take over-the-counter and prescription medicines only as told by your doctor.  If directed, put ice on the injured or painful area. ? Put ice in a plastic  bag. ? Place a towel between your skin and the bag. ? Leave the ice on for 20 minutes,  2-3 times per day.  If ice does not help, you can try using heat. Take a warm shower or warm bath, or use a heat pack as told by your doctor.  You may try a gentle neck and shoulder massage. Activity  Rest as needed. Follow instructions from your doctor about any activities to avoid.  Do exercises as told by your doctor or physical therapist. General instructions  If you were given a soft collar, wear it as told by your doctor.  Use a flat pillow when you sleep.  Keep all follow-up visits as told by your doctor. This is important. Contact a doctor if:  Your condition does not improve with treatment. Get help right away if:  Your pain gets worse and is not controlled with medicine.  You lose feeling or feel weak in your hand, arm, face, or leg.  You have a fever.  You have a stiff neck.  You cannot control when you poop or pee (have incontinence).  You have trouble with walking, balance, or talking. This information is not intended to replace advice given to you by your health care provider. Make sure you discuss any questions you have with your health care provider. Document Released: 03/29/2011 Document Revised: 09/15/2015 Document Reviewed: 06/03/2014 Elsevier Interactive Patient Education  2018 [employer].      Agustina Caroli, MD [facility]

## 2016-11-24 NOTE — Patient Instructions (Addendum)
     IF you received an x-ray today, you will receive an invoice from [facility]. Please contact [facility] at [phone] with questions or concerns regarding your invoice.   IF you received labwork today, you will receive an invoice from [facility]. Please contact [facility] at [phone] with questions or concerns regarding your invoice.   Our billing staff will not be able to assist you with questions regarding bills from these companies.  You will be contacted with the lab results as soon as they are available. The fastest way to get your results is to activate your My Chart account. Instructions are located on the last page of this paperwork. If you have not heard from us regarding the results in 2 weeks, please contact this office.     Cervical Radiculopathy Cervical radiculopathy means that a nerve in the neck is pinched or bruised. This can cause pain or loss of feeling (numbness) that runs from your neck to your arm and fingers. Follow these instructions at home: Managing pain  Take over-the-counter and prescription medicines only as told by your doctor.  If directed, put ice on the injured or painful area. ? Put ice in a plastic bag. ? Place a towel between your skin and the bag. ? Leave the ice on for 20 minutes, 2-3 times per day.  If ice does not help, you can try using heat. Take a warm shower or warm bath, or use a heat pack as told by your doctor.  You may try a gentle neck and shoulder massage. Activity  Rest as needed. Follow instructions from your doctor about any activities to avoid.  Do exercises as told by your doctor or physical therapist. General instructions  If you were given a soft collar, wear it as told by your doctor.  Use a flat pillow when you sleep.  Keep all follow-up visits as told by your doctor. This is important. Contact a doctor if:  Your condition does not improve with treatment. Get help right away if:  Your  pain gets worse and is not controlled with medicine.  You lose feeling or feel weak in your hand, arm, face, or leg.  You have a fever.  You have a stiff neck.  You cannot control when you poop or pee (have incontinence).  You have trouble with walking, balance, or talking. This information is not intended to replace advice given to you by your health care provider. Make sure you discuss any questions you have with your health care provider. Document Released: 03/29/2011 Document Revised: 09/15/2015 Document Reviewed: 06/03/2014 Elsevier Interactive Patient Education  2018 Elsevier Inc.  

## 2017-01-14 ENCOUNTER — Ambulatory Visit: Payer: 59 | Admitting: Cardiovascular Disease

## 2017-01-28 ENCOUNTER — Ambulatory Visit (INDEPENDENT_AMBULATORY_CARE_PROVIDER_SITE_OTHER): Payer: 59 | Admitting: Emergency Medicine

## 2017-01-28 ENCOUNTER — Encounter: Payer: Self-pay | Admitting: Emergency Medicine

## 2017-01-28 DIAGNOSIS — G4733 Obstructive sleep apnea (adult) (pediatric): Secondary | ICD-10-CM

## 2017-01-28 DIAGNOSIS — R490 Dysphonia: Secondary | ICD-10-CM | POA: Diagnosis not present

## 2017-01-28 MED ORDER — ESOMEPRAZOLE MAGNESIUM 40 MG PO CPDR: DELAYED_RELEASE_CAPSULE | ORAL | 0 refills | Status: DC

## 2017-01-28 MED ORDER — OMEPRAZOLE 20 MG PO CPDR: 20.0000 mg | DELAYED_RELEASE_CAPSULE | Freq: Two times a day (BID) | ORAL | 5 refills | Status: DC

## 2017-01-28 NOTE — Patient Instructions (Addendum)
Please restart Zyrtec once a day Please stop omeprazole  We will start Nexium 40mg  twice a day for 2 weeks, then decrease to once a day.  Please continue to wear your CPAP every night. We will discuss with [facility] with your oxygen is on continuous flow versus pulsed flow. It should be changed to continuous flow. Continue Symbicort 2 puffs twice a day. Remember to gargle after using this medication. Follow with Dr Lamonte Sakai in 2 months or sooner if you have any problems.

## 2017-01-28 NOTE — Progress Notes (Signed)
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 Also with a history of severe obstructive sleep apnea, currently on CPAP cm water. She has obstructive lung disease associated with her sarcoidosis. Also allergic rhinitis. She underwent a CT chest 09/05/16 that I have reviewed. This showed no evidence of interstitial disease, infiltrates, lymphadenopathy. She did have some subtle paraseptal emphysema. Also showed some evidence coronary calcium. She believes that her exertional SOB is worse compared with last time. More hoarse over the last 2 months - saw ENT and ruled out lesion. Speculated GERD so she started omeprazole. She blows her nose a lot, has congestion. She used to be on zyrtec. She remains on symbicort - she is unsure whether it helps her, may benefit her some. She has gained a bit of weight.     Exam:  Vitals:   01/28/17 1042  BP: (!) 160/80  Pulse: 64  SpO2: 98%  Weight: 299 lb (135.6 kg)  Height: 5\' 6"  (1.676 m)    Gen: Pleasant, obese, in no distress,  normal affect, obese   ENT: No lesions,  mouth clear,  oropharynx clear, no postnasal drip  Neck: No JVD, no Stridor  Lungs: No use of accessory muscles, somewhat distant, no crackles or wheezing  Cardiovascular: RRR, heart sounds normal, no murmur or gallops, no peripheral edema  Musculoskeletal: No deformities, no cyanosis or clubbing  Neuro: alert, non focal  Skin: no rash   09/05/16 CT chest  COMPARISON:  Chest CT 03/01/2015.  FINDINGS: Cardiovascular: Heart size is normal. There is no significant pericardial fluid, thickening or pericardial calcification. There is aortic atherosclerosis, as well as atherosclerosis of the great vessels of the mediastinum and the coronary arteries, including calcified atherosclerotic plaque in the left main, left anterior descending, left circumflex and right coronary arteries.  Mediastinum/Nodes: No pathologically enlarged mediastinal or  hilar lymph nodes. Please note that accurate exclusion of hilar adenopathy is limited on noncontrast CT scans. Esophagus is unremarkable in appearance. No axillary lymphadenopathy.  Lungs/Pleura: Small calcified granuloma in the periphery of the left upper lobe is unchanged. No other suspicious appearing pulmonary nodules or masses. No perilymphatic nodularity noted to suggest underlying sarcoidosis. Mild diffuse bronchial wall thickening with very mild centrilobular and paraseptal emphysema. No acute consolidative airspace disease. No pleural effusions.  Upper Abdomen: Aortic atherosclerosis.  Musculoskeletal: There are no aggressive appearing lytic or blastic lesions noted in the visualized portions of the skeleton.  IMPRESSION: 1. No imaging stigmata of sarcoidosis. 2. No acute findings are noted in the thorax.  3. Mild diffuse bronchial wall thickening with very mild centrilobular and paraseptal emphysema; imaging findings suggestive of underlying COPD. 4. Aortic atherosclerosis, in addition to left main and 3 vessel coronary artery disease. Please note that although the presence of coronary artery calcium documents the presence of coronary artery disease, the severity of this disease and any potential stenosis cannot be assessed on this non-gated CT examination. Assessment for potential risk factor modification, dietary therapy or pharmacologic therapy may be warranted, if clinically indicated    Hoarseness This is a persistent problem, has been worse over the last 2 months and does contribute to her dyspnea. Suspect that chronic rhinitis is a contributor although otherwise it doesn't bother her very much. Also suspect that GERD is undertreated. We will try to more aggressively treat both.  SARCOIDOSIS, PULMONARY Continue Symbicort. Treat GERD and allergic rhinitis as below  Obstructive sleep apnea We will continue CPAP every night. She is having some difficulty  with  mask fit but is working on this with Art gallery manager. It sounds like she is describing pulsed oxygen bled into her CPAP. I think this should be changed to continuous flow.  Baltazar Apo, MD, PhD 01/28/2017, 11:07 AM [facility] [phone] or if no answer [phone]

## 2017-01-28 NOTE — Assessment & Plan Note (Signed)
This is a persistent problem, has been worse over the last 2 months and does contribute to her dyspnea. Suspect that chronic rhinitis is a contributor although otherwise it doesn't bother her very much. Also suspect that GERD is undertreated. We will try to more aggressively treat both.

## 2017-01-28 NOTE — Assessment & Plan Note (Signed)
We will continue CPAP every night. She is having some difficulty with mask fit but is working on this with Art gallery manager. It sounds like she is describing pulsed oxygen bled into her CPAP. I think this should be changed to continuous flow.

## 2017-01-28 NOTE — Assessment & Plan Note (Signed)
Continue Symbicort. Treat GERD and allergic rhinitis as below

## 2017-01-31 ENCOUNTER — Telehealth: Payer: Self-pay

## 2017-01-31 NOTE — Telephone Encounter (Signed)
[name]

## 2017-02-01 ENCOUNTER — Other Ambulatory Visit: Payer: Self-pay | Admitting: Family Medicine

## 2017-02-01 DIAGNOSIS — I10 Essential (primary) hypertension: Secondary | ICD-10-CM

## 2017-02-04 ENCOUNTER — Telehealth: Payer: Self-pay | Admitting: Emergency Medicine

## 2017-02-04 NOTE — Telephone Encounter (Signed)
Spoke with pt, she states we have to call [employer] to override the taking the Nexium to be taken twice a day for 2 weeks, then once daily SHe states this will require a prior authorization. I advised her we could do this for patient.   Call for prior auth [phone]

## 2017-02-04 NOTE — Telephone Encounter (Signed)
Will route to [place] to follow up 

## 2017-02-04 NOTE — Telephone Encounter (Signed)
Rebecca Hicks (Key: [id number])   [employer] is reviewing your PA request. Typically an electronic response will be received within 72 hours. To check for an update later, open this request from your dashboard. You may close this dialog and return to your dashboard to perform other tasks.

## 2017-02-05 ENCOUNTER — Other Ambulatory Visit: Payer: Self-pay | Admitting: Family Medicine

## 2017-02-05 ENCOUNTER — Telehealth: Payer: Self-pay

## 2017-02-05 DIAGNOSIS — R200 Anesthesia of skin: Secondary | ICD-10-CM

## 2017-02-05 NOTE — Telephone Encounter (Signed)
-----   Message from Zenia Resides, MD sent at 02/05/2017  1:49 PM EDT ----- Neuro referral ordered ----- Message ----- From: Ottis Stain, CMA Sent: 02/05/2017   9:42 AM To: Zenia Resides, MD, Tia C Hill  Good Morning,  Dr. Andria Frames, will you please change her nervce conduction test from an order to a referral. They will call her once it shows up.   Thank you, Nehemiah Settle ----- Message ----- From: Carolan Clines Sent: 02/04/2017   8:08 AM To: Ottis Stain, CMA  An order for a nerve conduction test was placed, not a referral. Not sure how orders like that work. You Shoulde contact [facility] or [facility] and see if they are able to see orders that way, it may be like a Echo or doppler, that you may have needed to call and sch for patient.   Tia  ----- Message ----- From: Ottis Stain, CMA Sent: 01/31/2017   1:55 PM To: Tia C Hill  Hi Tia,  Ms Perl is calling because she has not gotten a call from neurology to schedule a nerve conduction test. The order was placed in July.  Is there anything I can do?  Nehemiah Settle

## 2017-02-05 NOTE — Telephone Encounter (Signed)
LVM for pt to call the office. If she calls, please let her know that Rebecca Hicks will be calling her to schedule an appt. Ottis Stain, CMA

## 2017-02-06 ENCOUNTER — Encounter: Payer: Self-pay | Admitting: Neurology

## 2017-02-06 ENCOUNTER — Other Ambulatory Visit: Payer: Self-pay | Admitting: *Deleted

## 2017-02-06 DIAGNOSIS — R200 Anesthesia of skin: Secondary | ICD-10-CM

## 2017-02-06 NOTE — Progress Notes (Unsigned)
emg 

## 2017-02-07 ENCOUNTER — Telehealth: Payer: Self-pay | Admitting: Emergency Medicine

## 2017-02-07 NOTE — Telephone Encounter (Signed)
LCL please advise if you have any papers from [facility] on this pts prescription that is not covered?  Thanks

## 2017-02-07 NOTE — Telephone Encounter (Signed)
I have checked RB's look at. I do not have any forms on this patient.

## 2017-02-07 NOTE — Telephone Encounter (Signed)
Spoke with a pharmacist at [facility]. He stated that the issue had already been taken care of already. Nothing else was needed at time of call.

## 2017-02-08 ENCOUNTER — Telehealth: Payer: Self-pay | Admitting: Emergency Medicine

## 2017-02-08 MED ORDER — ESOMEPRAZOLE MAGNESIUM 40 MG PO CPDR: DELAYED_RELEASE_CAPSULE | ORAL | 0 refills | Status: DC

## 2017-02-08 NOTE — Telephone Encounter (Signed)
Spoke with patient. She is having a hard time getting her Nexium covered by [facility] the way that RX is written. They will cover 30 tablets for $0, but 42 tablets will cost her over $500.   Spoke with [facility]. They stated that the RX will need to be changed to a 90 day supply in order for them to cover it.   Patient is ok with a 90 day supply. Will go ahead and change the RX.   Nothing else needed at time of call.

## 2017-02-11 NOTE — Telephone Encounter (Signed)
Attempted to check status of PA via [email or web address], states that the status was N/A d/t multiple encounters on this patient.  See phone notes in pt chart from 10/18 and 10/19- this matter has already been handled.  Will close encounter.

## 2017-02-15 ENCOUNTER — Other Ambulatory Visit: Payer: Self-pay | Admitting: Family Medicine

## 2017-02-15 DIAGNOSIS — I10 Essential (primary) hypertension: Secondary | ICD-10-CM

## 2017-02-20 ENCOUNTER — Telehealth: Payer: Self-pay | Admitting: Emergency Medicine

## 2017-02-20 ENCOUNTER — Other Ambulatory Visit: Payer: Self-pay | Admitting: Family Medicine

## 2017-02-20 DIAGNOSIS — E1140 Type 2 diabetes mellitus with diabetic neuropathy, unspecified: Secondary | ICD-10-CM

## 2017-02-20 DIAGNOSIS — Z794 Long term (current) use of insulin: Principal | ICD-10-CM

## 2017-02-20 DIAGNOSIS — IMO0002 Reserved for concepts with insufficient information to code with codable children: Secondary | ICD-10-CM

## 2017-02-20 DIAGNOSIS — E1165 Type 2 diabetes mellitus with hyperglycemia: Principal | ICD-10-CM

## 2017-02-20 MED ORDER — ESOMEPRAZOLE MAGNESIUM 40 MG PO CPDR: DELAYED_RELEASE_CAPSULE | ORAL | 0 refills | Status: DC

## 2017-02-20 NOTE — Telephone Encounter (Signed)
Spoke with pharmacist and they need a new Rx for Nexium with the sig changed to 1 capsule daily. Spoke with pt and advised her this was done and the Rx will be processed.

## 2017-02-21 ENCOUNTER — Ambulatory Visit (INDEPENDENT_AMBULATORY_CARE_PROVIDER_SITE_OTHER): Payer: 59 | Admitting: Neurology

## 2017-02-21 DIAGNOSIS — R200 Anesthesia of skin: Secondary | ICD-10-CM

## 2017-02-21 DIAGNOSIS — G5622 Lesion of ulnar nerve, left upper limb: Secondary | ICD-10-CM

## 2017-02-21 DIAGNOSIS — G5602 Carpal tunnel syndrome, left upper limb: Secondary | ICD-10-CM

## 2017-02-21 NOTE — Procedures (Signed)
[facility]  [address], [address]  [place], [place] [address] Tel: [phone] Fax:  [phone] Test Date:  02/21/2017  Patient: Rebecca Hicks DOB: Aug 27, 1945 Physician: Narda Amber, DO  Sex: Female Height: 5\' 6"  Ref Phys: Madison Hickman, MD  ID#: [id number] Temp: 36.3C Technician:    Patient Complaints: This is a 71 year-old female referred for evaluation of left hand tingling.  NCV & EMG Findings: Extensive electrodiagnostic testing of the left upper extremity shows:  1. Left median sensory response is within normal limits. Left ulnar sensory response shows prolonged latency (4.5 ms).  Left makes palmar sensory response shows prolonged latency. 2. Left median motor responses within normal limits. Left ulnar motor response shows reduced amplitude (5.3 mV) and decreased conduction velocity (A Elbow-B Elbow, 42 m/s).   3. Chronic motor axon loss changes are seen affecting the ulnar innervated muscles on the left, without accompanied active denervation.  Impression: 1. Left ulnar neuropathy with slowing across the elbow, demyelinating and axon loss in type; moderate in degree electrically. 2. Left median neuropathy at or distal to the wrist, consistent with the clinical diagnosis of carpal tunnel syndrome; very mild in degree electrically.   ___________________________ Narda Amber, DO    Nerve Conduction Studies Anti Sensory Summary Table   Stim Site NR Peak (ms) Norm Peak (ms) P-T Amp (V) Norm P-T Amp  Left Median Anti Sensory (2nd Digit)  36.3C  Wrist    3.8 <3.8 13.2 >10  Left Ulnar Anti Sensory (5th Digit)  36.3C  Wrist    4.5 <3.2 7.5 >5   Motor Summary Table   Stim Site NR Onset (ms) Norm Onset (ms) O-P Amp (mV) Norm O-P Amp Site1 Site2 Delta-0 (ms) Dist (cm) Vel (m/s) Norm Vel (m/s)  Left Median Motor (Abd Poll Brev)  36.3C  Wrist    3.5 <4.0 6.9 >5 Elbow Wrist 5.3 27.0 51 >50  Elbow    8.8  6.3         Left Ulnar Motor (Abd Dig Minimi)   36.3C  Wrist    2.3 <3.1 5.3 >7 B Elbow Wrist 5.1 27.0 53 >50  B Elbow    7.4  5.2  A Elbow B Elbow 2.4 10.0 42 >50  A Elbow    9.8  4.8          Comparison Summary Table   Stim Site NR Peak (ms) Norm Peak (ms) P-T Amp (V) Site1 Site2 Delta-P (ms) Norm Delta (ms)  Left Median/Ulnar Palm Comparison (Wrist - 8cm)  36.3C  Median Palm    2.3 <2.2 17.7 Median Palm Ulnar Palm    Ulnar Palm NR  <2.2        EMG   Side Muscle Ins Act Fibs Psw Fasc Number Recrt Dur Dur. Amp Amp. Poly Poly. Comment  Left Abd Poll Brev Nml Nml Nml Nml Nml Nml Nml Nml Nml Nml Nml Nml N/A  Left Ext Indicis Nml Nml Nml Nml Nml Nml Nml Nml Nml Nml Nml Nml N/A  Left PronatorTeres Nml Nml Nml Nml Nml Nml Nml Nml Nml Nml Nml Nml N/A  Left Biceps Nml Nml Nml Nml Nml Nml Nml Nml Nml Nml Nml Nml N/A  Left Triceps Nml Nml Nml Nml Nml Nml Nml Nml Nml Nml Nml Nml N/A  Left Deltoid Nml Nml Nml Nml Nml Nml Nml Nml Nml Nml Nml Nml N/A  Left ABD Dig Min Nml Nml Nml Nml 2- Rapid Some 1+ Some 1+ Nml  Nml N/A  Left FlexCarpiUln Nml Nml Nml Nml 1- Rapid Few 1+ Few 1+ Nml Nml N/A  Left 1stDorInt Nml Nml Nml Nml 2- Rapid Some 1+ Some 1+ Nml Nml N/A      Waveforms:

## 2017-02-27 ENCOUNTER — Telehealth: Payer: Self-pay | Admitting: Family Medicine

## 2017-02-27 NOTE — Telephone Encounter (Signed)
Pt has an appt 03-13-17 with dr Andria Frames to discuss nerve conduction study. She is quite worried about the results since dr Andria Frames said "it wasn't what he thought"  She would like to talk to dr Andria Frames

## 2017-02-28 ENCOUNTER — Ambulatory Visit: Payer: 59 | Admitting: Cardiovascular Disease

## 2017-02-28 ENCOUNTER — Encounter: Payer: Self-pay | Admitting: Cardiovascular Disease

## 2017-02-28 VITALS — BP 144/88 | HR 74 | Ht 66.0 in | Wt 304.4 lb

## 2017-02-28 DIAGNOSIS — I10 Essential (primary) hypertension: Secondary | ICD-10-CM

## 2017-02-28 DIAGNOSIS — I2584 Coronary atherosclerosis due to calcified coronary lesion: Secondary | ICD-10-CM | POA: Diagnosis not present

## 2017-02-28 DIAGNOSIS — I2510 Atherosclerotic heart disease of native coronary artery without angina pectoris: Secondary | ICD-10-CM | POA: Insufficient documentation

## 2017-02-28 NOTE — Patient Instructions (Signed)
Medication Instructions:  Your physician recommends that you continue on your current medications as directed. Please refer to the Current Medication list given to you today.   Labwork: None Ordered   Testing/Procedures: None Ordered   Follow-Up: Your physician wants you to follow-up in: 1 year with Dr. Nahser.  You will receive a reminder letter in the mail two months in advance. If you don't receive a letter, please call our office to schedule the follow-up appointment.   If you need a refill on your cardiac medications before your next appointment, please call your pharmacy.   Thank you for choosing [facility]! Alanda Colton, RN [phone]    
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
## 2017-02-28 NOTE — Telephone Encounter (Signed)
Called and left a reassuring message that it is likely a pinched nerve at the elbow.  Asked to keep appointment and to call back if still had concerns.

## 2017-03-01 NOTE — Addendum Note (Signed)
Addended by: Patterson Hammersmith A on: 03/01/2017 01:05 PM   Modules accepted: Orders

## 2017-03-13 ENCOUNTER — Ambulatory Visit: Payer: 59 | Admitting: Family Medicine

## 2017-03-13 ENCOUNTER — Other Ambulatory Visit: Payer: Self-pay

## 2017-03-13 ENCOUNTER — Encounter: Payer: Self-pay | Admitting: Family Medicine

## 2017-03-13 VITALS — BP 124/68 | HR 68 | Temp 97.9°F | Ht 66.0 in | Wt 306.0 lb

## 2017-03-13 DIAGNOSIS — E1140 Type 2 diabetes mellitus with diabetic neuropathy, unspecified: Secondary | ICD-10-CM

## 2017-03-13 DIAGNOSIS — E11649 Type 2 diabetes mellitus with hypoglycemia without coma: Secondary | ICD-10-CM | POA: Diagnosis not present

## 2017-03-13 DIAGNOSIS — R200 Anesthesia of skin: Secondary | ICD-10-CM | POA: Diagnosis not present

## 2017-03-13 DIAGNOSIS — Z23 Encounter for immunization: Secondary | ICD-10-CM

## 2017-03-13 LAB — POCT GLYCOSYLATED HEMOGLOBIN (HGB A1C): Hemoglobin A1C: 9.6

## 2017-03-13 NOTE — Assessment & Plan Note (Signed)
Given current problems, she will go to bariatric surgery introductory classes again and see if interested.

## 2017-03-13 NOTE — Patient Instructions (Signed)
See Dr. Valentina Lucks in our office to change up your diabetes medications.   Think about how you are leaning on your elbow.  Make sure you are not pressing on the nerve.   See me after you have had two or three sessions with Dr. Valentina Lucks.

## 2017-03-13 NOTE — Assessment & Plan Note (Signed)
Poor control but lows at night.  Will refer to Dr. Valentina Lucks for revamp of med program.

## 2017-03-13 NOTE — Progress Notes (Signed)
   Subjective:    Patient ID: Rebecca Hicks, female    DOB: 05/06/1945, 71 y.o.   MRN: [id number]  HPI  Two major issues: 1. FU left hand numbness with NCS showing ulnar neuropathy.  Denies any compression - does not recall positioning such that pressure is on the elbow.  Also having some tingling in the median nerve distribution and some tingling of the right hand.  Has longstanding diabetic neuropathy of both feet, Lt>right.  2. DM poor control.  She was making some progress but worsening back pain yields less ability to exercise.  Also has receivied some epidural steroid injections.  C/O once per week low blood sugars at night.     Review of Systems     Objective:   Physical Exam Bilateral hands, good grip strength. Lungs clear Cardiac RRR without m or g        Assessment & Plan:

## 2017-03-13 NOTE — Assessment & Plan Note (Signed)
Documented ulnar neuropathy.  Most likely cause is diabetic polyneuropathy with therapy being better control of DM.

## 2017-03-13 NOTE — Assessment & Plan Note (Signed)
With nocturnal hypoglycemia and poor control, feel her diabetic regimen needs fully revamped.  Will refer to [facility].  Likely switch off 70/30 and onto lantus with meal coverage.  Also may want to add newer agent as adjunct therapy.

## 2017-03-27 ENCOUNTER — Other Ambulatory Visit: Payer: Self-pay | Admitting: Family Medicine

## 2017-03-28 ENCOUNTER — Encounter: Payer: Self-pay | Admitting: Pharmacist

## 2017-03-28 ENCOUNTER — Ambulatory Visit: Payer: 59 | Admitting: Pharmacist

## 2017-03-28 DIAGNOSIS — E11649 Type 2 diabetes mellitus with hypoglycemia without coma: Secondary | ICD-10-CM

## 2017-03-28 DIAGNOSIS — E7800 Pure hypercholesterolemia, unspecified: Secondary | ICD-10-CM

## 2017-03-28 MED ORDER — LIRAGLUTIDE 18 MG/3ML ~~LOC~~ SOPN: 1.2000 mg | PEN_INJECTOR | Freq: Every day | SUBCUTANEOUS | 3 refills | Status: DC

## 2017-03-28 MED ORDER — LIRAGLUTIDE 18 MG/3ML ~~LOC~~ SOPN: 1.2000 mg | PEN_INJECTOR | Freq: Every day | SUBCUTANEOUS | 0 refills | Status: DC

## 2017-03-28 MED ORDER — ROSUVASTATIN CALCIUM 10 MG PO TABS: 10.0000 mg | ORAL_TABLET | ORAL | 3 refills | Status: DC

## 2017-03-28 NOTE — Patient Instructions (Addendum)
Reduce 70/30 insulin to 70 units in the AM and 50 units in the PM  Start Victoza 0.6mg  once daily for 1 week THEN increase to 1.2mg  once daily until your next visit.   Call if you still have low readings at night.   Next visit in Early January.

## 2017-03-28 NOTE — Progress Notes (Signed)
    S:     Chief Complaint  Patient presents with  . Medication Management    Diabetes    Patient arrives in fair spirits, walking without assistance.  Presents for diabetes evaluation, education, and management at the request of her PCP Dr. Andria Frames, on 03/13/2017.  Patient reports Diabetes was diagnosed in 1993.  Reports use of glipizide and pioglitazone in the past.  Reports taking metformin since early 2000s (only able to take 500mg  BID, higher doses cause diarrhea).  Started insulin ~2002.  Willing to consider split mix insulin dosing using more frequent dosing of more expensive insulins.    Patient reports adherence with medications.  Current diabetes medications include:70/30 insulin, metformin  Current hypertension medications include: amlodipine, clonidine, spironolactone, telmisartan/hctz  Patient denies hypoglycemic events.  Lowest readings in the 130s.   Patient reported dietary habits: Eats 2-3 meals/day  Patient reported exercise habits: limited.     Patient reports nocturia.  Patient reports significant neuropathy.  O:  Physical Exam  Constitutional: She appears well-developed and well-nourished.  Musculoskeletal: She exhibits edema.  Vitals reviewed. Bilateral swelling with Left > right  3+   Review of Systems  Neurological: Positive for tingling and sensory change.  All other systems reviewed and are negative. Leg pain and handpain/numbness continue to be bothersome.    Lab Results  Component Value Date   HGBA1C 9.6 03/13/2017   Vitals:   03/28/17 0911  BP: 118/70  Pulse: 65  SpO2: 94%    Home fasting CBG: ~ 130- 200s (reported)  2 hour post-prandial/random CBG: 200s primarily.    A/P: Diabetes longstanding,diagnosed in 1993,  currently with poor control due to insulin resistance, and sedentary lifestyle. . Patient reports symptomatic hypoglycemic events mutliple times at night over the last 2 months. She is able to verbalize appropriate  hypoglycemia management.  Patient reports adherence with medication. Control is suboptimal due to inadequate medication regimen for her longstanding disease.   Following discussion of both SGLT2 and GLP options we decided to Initiated a trial of Victoza (liraglutide) at 0.6mg  for 1 week followed by 1.2mg  daily until she returns in 1 month.  At this time she will decrease her 70/30 insulin from 80AM and 60 units PM to 70 units AM and 50 units PM.   Asked to call our office if she has additional low readings that wake her from sleep.  Consider additional titration of liraglutide or initiation of Jardiance (empagliflozin) at next visit. In 1 month.   ASCVD risk greater than 7.5%. Continued Aspirin 81 mg and reinitiation of rosuvastatin 10 mg TIW - new Rx sent to her pharmacy.   Hypertension longstanding currently at goal on multi-drug regimen.  Patient reports adherence with medication. If control continue, consider lowering dose of amlodipine in attempt to mitigate lower extremity edema effects.    Written patient instructions provided.  Total time in face to face counseling 45 minutes.   Follow up in Pharmacist Clinic Visit in 1 month.

## 2017-03-28 NOTE — Progress Notes (Signed)
Patient ID: Rebecca Hicks, female   DOB: 1945/09/30, 71 y.o.   MRN: [id number] Reviewed: Agree with Dr. Graylin Shiver documentation and management.

## 2017-04-12 ENCOUNTER — Other Ambulatory Visit: Payer: Self-pay | Admitting: Orthopedic Surgery

## 2017-04-12 DIAGNOSIS — M5116 Intervertebral disc disorders with radiculopathy, lumbar region: Secondary | ICD-10-CM

## 2017-04-15 ENCOUNTER — Other Ambulatory Visit: Payer: Self-pay | Admitting: Emergency Medicine

## 2017-04-20 ENCOUNTER — Other Ambulatory Visit: Payer: 59

## 2017-04-24 ENCOUNTER — Other Ambulatory Visit: Payer: Self-pay | Admitting: Family Medicine

## 2017-04-26 ENCOUNTER — Inpatient Hospital Stay: Admission: RE | Admit: 2017-04-26 | Payer: 59 | Source: Ambulatory Visit

## 2017-04-26 ENCOUNTER — Ambulatory Visit: Payer: 59 | Admitting: Family Medicine

## 2017-04-26 VITALS — BP 160/82 | HR 100 | Temp 98.4°F | Ht 66.0 in | Wt 291.2 lb

## 2017-04-26 DIAGNOSIS — J069 Acute upper respiratory infection, unspecified: Secondary | ICD-10-CM | POA: Diagnosis not present

## 2017-04-26 MED ORDER — GUAIFENESIN-CODEINE 100-10 MG/5ML PO SOLN
10.0000 mL | Freq: Four times a day (QID) | ORAL | 0 refills | Status: DC | PRN
Start: 2017-04-26 — End: 2017-05-09

## 2017-04-26 NOTE — Patient Instructions (Addendum)
It was a pleasure to see you today! Thank you for choosing [facility] for your primary care. Rebecca Hicks was seen for viral URI. Come back to the clinic if symptoms persist longer than 2 weeks, you develop a fever, or get worse. Go tot he ED, if you get chest pain, Shortness of Breath, or symptoms get severe.   Take the Robitussin during the day and the Codeine syrup at night as needed. Continue using tylenol as needed for pain. Avoid ibuprofen and over the counter decongestants as these can affect blood pressure. Be sure to drink plenty of fluids.   Best,  Marny Lowenstein, MD, [place] - PGY1 04/26/2017 2:25 PM  Viral Respiratory Infection A viral respiratory infection is an illness that affects parts of the body used for breathing, like the lungs, nose, and throat. It is caused by a germ called a virus. Some examples of this kind of infection are:  A cold.  The flu (influenza).  A respiratory syncytial virus (RSV) infection.  How do I know if I have this infection? Most of the time this infection causes:  A stuffy or runny nose.  Yellow or green fluid in the nose.  A cough.  Sneezing.  Tiredness (fatigue).  Achy muscles.  A sore throat.  Sweating or chills.  A fever.  A headache.  How is this infection treated? If the flu is diagnosed early, it may be treated with an antiviral medicine. This medicine shortens the length of time a person has symptoms. Symptoms may be treated with over-the-counter and prescription medicines, such as:  Expectorants. These make it easier to cough up mucus.  Decongestant nasal sprays.  Doctors do not prescribe antibiotic medicines for viral infections. They do not work with this kind of infection. How do I know if I should stay home? To keep others from getting sick, stay home if you have:  A fever.  A lasting cough.  A sore throat.  A runny nose.  Sneezing.  Muscles  aches.  Headaches.  Tiredness.  Weakness.  Chills.  Sweating.  An upset stomach (nausea).  Follow these instructions at home:  Rest as much as possible.  Take over-the-counter and prescription medicines only as told by your doctor.  Drink enough fluid to keep your pee (urine) clear or pale yellow.  Gargle with salt water. Do this 3-4 times per day or as needed. To make a salt-water mixture, dissolve -1 tsp of salt in 1 cup of warm water. Make sure the salt dissolves all the way.  Use nose drops made from salt water. This helps with stuffiness (congestion). It also helps soften the skin around your nose.  Do not drink alcohol.  Do not use tobacco products, including cigarettes, chewing tobacco, and e-cigarettes. If you need help quitting, ask your doctor. Get help if:  Your symptoms last for 10 days or longer.   Your symptoms get worse over time.  You have a fever.  You have very bad pain in your face or forehead.  Parts of your jaw or neck become very swollen. Get help right away if:  You feel pain or pressure in your chest.  You have shortness of breath.  You faint or feel like you will faint.  You keep throwing up (vomiting).  You feel confused. This information is not intended to replace advice given to you by your health care provider. Make sure you discuss any questions you have with your health care  provider. Document Released: 03/22/2008 Document Revised: 09/15/2015 Document Reviewed: 09/15/2014 Elsevier Interactive Patient Education  2018 [employer].

## 2017-04-26 NOTE — Progress Notes (Signed)
Rebecca Hicks is a 72 y.o. female presenting with a sore throat for 3 days.  Associated symptoms include:  headache and nasal/sinus congestion.  Symptoms are intermittent.  Home treatment thus far includes:  rest, hydration, NSAIDS/acetaminophen, lozenges and OTC sore throat / cold products.  No known sick contacts with similar symptoms.  No history of of similar symptoms.  Exam:  BP (!) 160/82 (BP Location: Left Arm, Patient Position: Sitting, Cuff Size: Normal)   Pulse 100   Temp 98.4 F (36.9 C) (Oral)   Ht 5\' 6"  (1.676 m)   Wt 291 lb 3.2 oz (132.1 kg)   SpO2 93%   BMI 47.00 kg/m  Constitutional: well appearing, no distress HEENT: AT, erythema of pharynx, no exudate Neck: no lymphadenopathy, no lymphnodes Heart: rrr, no cp Lungs CTAB, no increased work of breathing Skin no rash, dry, intact   A/P  Patient presents with symptoms consistent with viral URI. Centor Criteria is 1, so is not likely to have Streptococcal pharyngitis. Influenza is possible, but patient is afebrile and well appearing. Regardless of viral etiology, treatment is supportive. Cough is largest. Will rx codiene syrup for cough at night and Robitussin during the day. Tylenol for pain. Recommended avoidance of otc decongestants for affects on bp.

## 2017-04-29 ENCOUNTER — Telehealth: Payer: Self-pay | Admitting: *Deleted

## 2017-04-29 ENCOUNTER — Ambulatory Visit: Payer: 59 | Admitting: Emergency Medicine

## 2017-04-29 MED ORDER — AZITHROMYCIN 250 MG PO TABS: ORAL_TABLET | ORAL | 0 refills | Status: DC

## 2017-04-29 NOTE — Telephone Encounter (Signed)
Called and discussed.  Given duration, will Rx with antibiotic

## 2017-04-29 NOTE — Telephone Encounter (Signed)
Pt calls nurse line.    States that she is still not better.  Denies fever but c/o the following: Sore throat Productive cough (yellow phlegm) Headache Generally feeling bad  She states that cough medicine helps her sleep at night but not much else.   Wants to know if Dr. Andria Frames thinks she needs and Abx or how long she should expect to feel bad.  Yarden Manuelito, Salome Spotted, CMA

## 2017-04-30 ENCOUNTER — Encounter: Payer: Self-pay | Admitting: Family Medicine

## 2017-05-02 ENCOUNTER — Other Ambulatory Visit: Payer: 59

## 2017-05-09 ENCOUNTER — Ambulatory Visit: Payer: 59 | Admitting: Pharmacist

## 2017-05-09 ENCOUNTER — Encounter: Payer: Self-pay | Admitting: Pharmacist

## 2017-05-09 VITALS — BP 152/108 | HR 84 | Ht 66.0 in | Wt 298.6 lb

## 2017-05-09 DIAGNOSIS — E1140 Type 2 diabetes mellitus with diabetic neuropathy, unspecified: Secondary | ICD-10-CM | POA: Diagnosis not present

## 2017-05-09 DIAGNOSIS — E11649 Type 2 diabetes mellitus with hypoglycemia without coma: Secondary | ICD-10-CM | POA: Diagnosis not present

## 2017-05-09 MED ORDER — INSULIN DEGLUDEC 100 UNIT/ML ~~LOC~~ SOPN: 50.0000 [IU] | PEN_INJECTOR | Freq: Every day | SUBCUTANEOUS | 1 refills | Status: DC

## 2017-05-09 MED ORDER — INSULIN LISPRO 100 UNIT/ML (KWIKPEN): 15.0000 [IU] | PEN_INJECTOR | Freq: Three times a day (TID) | SUBCUTANEOUS | 11 refills | Status: DC

## 2017-05-09 MED ORDER — LIRAGLUTIDE 18 MG/3ML ~~LOC~~ SOPN: 1.8000 mg | PEN_INJECTOR | Freq: Every day | SUBCUTANEOUS | 0 refills | Status: DC

## 2017-05-09 NOTE — Assessment & Plan Note (Signed)
Diabetes longstanding currently uncontrolled as evidenced by A1C and post-prandial blood sugars. Patient denies hypoglycemic events and is able to verbalize appropriate hypoglycemia management plan. Patient denies adherence with medication. Control is suboptimal due to medication nonadherence with erratic meal schedule, stress at work, sedentary lifestyle. Change insulin 70/30 to Tresiba 50 units once daily and humalog 15 units with meals (up to three times daily). Increase Victoza to 1.8 mg daily as she is tolerating 1.2 mg dose well. Patient is to switch insulin to new regimen once the supply is shipped from her mail order pharmacy. Check CMET and urine microalbumin today. Patient educated on purpose, proper use and potential adverse effects of Tresiba, Humalog, and Victoza.  Following instruction patient verbalized understanding of treatment plan. Next A1C anticipated 2-3 months.

## 2017-05-09 NOTE — Progress Notes (Signed)
S:     Chief Complaint  Patient presents with  . Medication Management    DM    Patient arrives in fair spirits, ambulating with some difficulty but without assitance.  Presents for diabetes evaluation, education, and management at the request of Dr. Andria Frames. Patient was referred on 03/13/2017.  Patient was last seen by Primary Care Provider on 04/26/2017 for a URI. Last seen in pharmacy clinic on 03/28/2017 - at that time, victoza was started with titration to 1.2 mg daily and Insulin 70/30 was decreased.   Today patient reports taking insulin dose of 70/30 50 units in AM and 40 units in PM which is lower than prescribed. She reports she "may have gotten a little confused". She endorses good adherence to nighttime 70/30 but frequently misses AM dose when she does not eat breakfast. Denies any GI upset/nausea/vomiting with new Victoza. Denies myalgias/fatigue with new rosuvastatin. Reports a significant amount of stress at work and is going to retire March 31st which, she is excited about. Reports "good" fasting blood sugars and "outrageous" afternoon/pre-supper blood sugars. Reports she has a 30-45 day supply left of her insulin 70/30. Has not eaten or taken any medication this morning. Requests lab work to check on kidney function and cholesterol today.   Family/Social History: comes from large family, sisters on HD. DM very prevalent in her family.   Patient denies adherence with medications (clondine midday dose, Insulin 70/30).  Current diabetes medications include: Humulin 70/30 50 in AM and 40 in PM (prescribed 70 in AM and 50 in PM), victoza 1.2 mg daily, metformin 500 mg BID Current hypertension medications include: metoprolol succinate 100 mg daily, amlodipine 10 mg daily, clonidine 0.1 mg BID (prescribed TID), spironolactone 25 mg daily, telmisartan-HCTZ 80-12.5 mg daily  Patient denies hypoglycemic events.  Patient reported dietary habits: Eats 1-2 meals/day Breakfast: skips 4  times a week, protein shakes  Lunch:fast food but tries to eat healthier - yesterday 1/2 sandwich and soup  Dinner:PB and crackers, milk Snacks: crackers, trail mix Drinks:coffee + cream x2/day, water, milk  Patient reported exercise habits: none at present   Patient reports neuropathy. O:  Physical Exam  Constitutional: She appears well-developed and well-nourished.     Review of Systems  Gastrointestinal: Negative for abdominal pain, diarrhea, nausea and vomiting.     Lab Results  Component Value Date   HGBA1C 9.6 03/13/2017   Vitals:   05/09/17 0922  BP: (!) 152/108  Pulse: 84    Home fasting CBG: 135 this morning, Typically 120s-140s  2 hour post-prandial/random CBG: afternoons - before evening meal: high 200s and 300s  A/P: Diabetes longstanding currently uncontrolled as evidenced by A1C and post-prandial blood sugars. Patient denies hypoglycemic events and is able to verbalize appropriate hypoglycemia management plan. Patient denies adherence with medication. Control is suboptimal due to medication nonadherence with erratic meal schedule, stress at work, sedentary lifestyle. Change insulin 70/30 to Tresiba 50 units once daily and humalog 15 units with meals (up to three times daily). Increase Victoza to 1.8 mg daily as she is tolerating 1.2 mg dose well. Patient is to switch insulin to new regimen once the supply is shipped from her mail order pharmacy. Check CMET and urine microalbumin today. Patient educated on purpose, proper use and potential adverse effects of Tresiba, Humalog, and Victoza.  Following instruction patient verbalized understanding of treatment plan. Next A1C anticipated 2-3 months.     ASCVD risk - Patient with DM aged 30-75 with multiple  risk factors including HTN, CKD, HLD, coronary artery calcification.Continued Aspirin 81 mg and Continued rosuvastatin 10 mg three times weekly, given intolerance to atorvastatin in the past and hesitancy to restrial  statins. Check lipid panel today.   Hypertension longstanding currently uncontrolled in the setting of missed AM dose of all medications. .  Patient reports adherence with medication regularly. Continue current medications, reassess at next visit.   Written patient instructions provided.  Total time in face to face counseling 30 minutes.   Follow up in Pharmacist Clinic Visit 5-6 weeks.   Patient seen with Onnie Boer, PharmD Candidate and Deirdre Pippins, PGY2 Pharmacy Resident, PharmD, BCPS, and Nuala Alpha, DO.

## 2017-05-09 NOTE — Progress Notes (Signed)
Patient ID: Rebecca Hicks, female   DOB: 1946/03/18, 71 y.o.   MRN: [id number] Reviewed: I agree with Dr. Graylin Shiver documentation and management.

## 2017-05-09 NOTE — Patient Instructions (Addendum)
Thanks for coming to see [place]!!   1. STOP insulin 70/30 when your new insulin comes in the mail 2. START Tresiba 50 units once a day 3. START humalog 15 units three times daily WITH MEALS 4. INCREASE your Victoza to 1.8 mg daily  5. Labs today (lipids, kidney, liver). We will call you if there are any issues  Test your blood sugar first thing in the morning and ~2hours after meals   Come back to see Dr. Valentina Lucks in 5-6 weeks.   Make an appointment to see Dr. Andria Frames before you leave.

## 2017-05-10 LAB — COMPREHENSIVE METABOLIC PANEL
ALBUMIN: 3.8 g/dL (ref 3.5–4.8)
ALK PHOS: 86 IU/L (ref 39–117)
ALT: 13 IU/L (ref 0–32)
AST: 17 IU/L (ref 0–40)
Albumin/Globulin Ratio: 1.2 (ref 1.2–2.2)
BILIRUBIN TOTAL: 0.2 mg/dL (ref 0.0–1.2)
BUN / CREAT RATIO: 20 (ref 12–28)
BUN: 24 mg/dL (ref 8–27)
CHLORIDE: 103 mmol/L (ref 96–106)
CO2: 22 mmol/L (ref 20–29)
CREATININE: 1.19 mg/dL — AB (ref 0.57–1.00)
Calcium: 9.2 mg/dL (ref 8.7–10.3)
GFR calc Af Amer: 53 mL/min/{1.73_m2} — ABNORMAL LOW (ref >59)
GFR calc non Af Amer: 46 mL/min/{1.73_m2} — ABNORMAL LOW (ref >59)
GLUCOSE: 152 mg/dL — AB (ref 65–99)
Globulin, Total: 3.1 g/dL (ref 1.5–4.5)
Potassium: 4.5 mmol/L (ref 3.5–5.2)
SODIUM: 143 mmol/L (ref 134–144)
Total Protein: 6.9 g/dL (ref 6.0–8.5)

## 2017-05-10 LAB — MICROALBUMIN / CREATININE URINE RATIO
CREATININE, UR: 114 mg/dL
MICROALBUM., U, RANDOM: 1280.9 ug/mL
Microalb/Creat Ratio: 1123.6 mg/g creat — ABNORMAL HIGH (ref 0.0–30.0)

## 2017-05-10 LAB — LIPID PANEL
CHOL/HDL RATIO: 5.1 ratio — AB (ref 0.0–4.4)
Cholesterol, Total: 288 mg/dL — ABNORMAL HIGH (ref 100–199)
HDL: 57 mg/dL (ref >39)
LDL Calculated: 210 mg/dL — ABNORMAL HIGH (ref 0–99)
Triglycerides: 103 mg/dL (ref 0–149)
VLDL Cholesterol Cal: 21 mg/dL (ref 5–40)

## 2017-05-13 NOTE — Progress Notes (Signed)
LDL noted to be 210 on pravastatin 3 days per week.  Consider addition of ezetimibe in the future.

## 2017-05-13 NOTE — Progress Notes (Signed)
Patient ID: Rebecca Hicks, female   DOB: 1945-05-06, 72 y.o.   MRN: [id number] I called.  LDL of 210 is concerning.  She had only been on the pravastatin x 3 weeks.  Also, she is much more motivated to control diabetes.  She will see me in three weeks.  No new meds for now.   1. High LDL.  Recheck in ~9 weeks Add zetia if still high. 2. + urine microalbumin.  Already on ARB.  Main intervention now is to control DM and BP.    Keep appointment with me in three weeks.

## 2017-05-14 ENCOUNTER — Other Ambulatory Visit: Payer: Self-pay | Admitting: Family Medicine

## 2017-05-14 DIAGNOSIS — I10 Essential (primary) hypertension: Secondary | ICD-10-CM

## 2017-05-15 ENCOUNTER — Telehealth: Payer: Self-pay

## 2017-05-15 NOTE — Telephone Encounter (Signed)
Patient left message on nurse line asking for return call from Dr Valentina Lucks or his team. States she had an appt last week and some medication adjustments were made. She has some questions and possible adjustment issues. Please call at [phone].  Danley Danker, RN [facility] RN)

## 2017-05-20 MED ORDER — INSULIN DEGLUDEC 100 UNIT/ML ~~LOC~~ SOPN: 40.0000 [IU] | PEN_INJECTOR | Freq: Every day | SUBCUTANEOUS | Status: DC

## 2017-05-20 NOTE — Telephone Encounter (Signed)
Contacted patient, apologized for late return of call.  She reported multiple low readings of CBGs at a level of 60.  She reported she is tolerating her Victoza at the 1.8mg  dose.    She was instructed to continue Victoza at 1.8mg  daily (Pt denied any side effects with this dose).  Instructed patient to reduce Tresiba from 50 to 40 units once daily.  Advised to continue same prandial dose of Humalog at 15 units prior to each meal (2 or 3 times daily).   Patient acknowledged understanding of treatment plan.   She was asked to call our office if she had any additional low readings (< 90) in the upcoming weeks prior to her next visit with Dr. Andria Frames.  Plan follow up in Rx clinic in ~ 1 month.

## 2017-05-20 NOTE — Telephone Encounter (Signed)
Noted and agree. 

## 2017-05-23 ENCOUNTER — Encounter: Payer: Self-pay | Admitting: Family Medicine

## 2017-05-24 ENCOUNTER — Ambulatory Visit: Payer: 59 | Admitting: Pharmacist

## 2017-06-06 ENCOUNTER — Encounter: Payer: Self-pay | Admitting: Family Medicine

## 2017-06-06 ENCOUNTER — Other Ambulatory Visit: Payer: Self-pay

## 2017-06-06 ENCOUNTER — Ambulatory Visit: Payer: 59 | Admitting: Family Medicine

## 2017-06-06 VITALS — BP 154/72 | HR 84 | Temp 97.7°F | Ht 66.0 in | Wt 297.8 lb

## 2017-06-06 DIAGNOSIS — I10 Essential (primary) hypertension: Secondary | ICD-10-CM | POA: Diagnosis not present

## 2017-06-06 DIAGNOSIS — E11649 Type 2 diabetes mellitus with hypoglycemia without coma: Secondary | ICD-10-CM | POA: Diagnosis not present

## 2017-06-06 DIAGNOSIS — E7800 Pure hypercholesterolemia, unspecified: Secondary | ICD-10-CM

## 2017-06-06 DIAGNOSIS — E1140 Type 2 diabetes mellitus with diabetic neuropathy, unspecified: Secondary | ICD-10-CM

## 2017-06-06 DIAGNOSIS — Z794 Long term (current) use of insulin: Secondary | ICD-10-CM

## 2017-06-06 MED ORDER — PREGABALIN 75 MG PO CAPS: 75.0000 mg | ORAL_CAPSULE | Freq: Two times a day (BID) | ORAL | 1 refills | Status: DC

## 2017-06-06 MED ORDER — ONETOUCH ULTRA 2 W/DEVICE KIT: PACK | 0 refills | Status: DC

## 2017-06-06 MED ORDER — INSULIN PEN NEEDLE 29G X 12.7MM MISC: 3 refills | Status: DC

## 2017-06-06 MED ORDER — ROSUVASTATIN CALCIUM 10 MG PO TABS: 10.0000 mg | ORAL_TABLET | Freq: Every day | ORAL | 3 refills | Status: DC

## 2017-06-06 MED ORDER — BLOOD PRESSURE KIT DEVI: 1.0000 | Freq: Every day | 0 refills | Status: DC

## 2017-06-06 NOTE — Patient Instructions (Addendum)
I hope the lyrica gets your leg pain under control. Please do check your blood pressure at home.  The latest studies suggest that we should push hard to lower your blood pressure.  It would be best if most of your readings are 130/80 or less. Keep your appointment with Dr. Leodis Sias luck with your goal of wt loss and more exercise in retirement. Congrats on retirement.   See me in three months.  Sooner if problems.

## 2017-06-07 ENCOUNTER — Encounter: Payer: Self-pay | Admitting: Family Medicine

## 2017-06-07 NOTE — Assessment & Plan Note (Signed)
Refilled needles with correct sig.

## 2017-06-07 NOTE — Assessment & Plan Note (Signed)
Unclear whether neuropathy or radiculopathy.  I favor neuropathy since bilateral.  Regardless, lyrica should help.

## 2017-06-07 NOTE — Progress Notes (Signed)
   Subjective:    Patient ID: Rebecca Hicks, female    DOB: Oct 20, 1945, 72 y.o.   MRN: [id number]  HPI Multiple issues: 1. HBP.  Needs new BP cuff.  Reluctant to increase BP meds today thinking that she is in pain and that may be contributing.   2. Morbid obesity.  Wt steady.  She is retiring in two weeks and has definite plans to increase activity and attend more to diet. 3. Bilateral thigh tenderness and hyperesthesia.  Thinks likely her diabetic neuropathy.  Wants to go back on lyrica at a lower dose.  She had side effects at high dose.  No bowel or bladder problems.  No saddle anesthesia 4. Needs refill on crestor which she is now taking daily. 5. Needs refill on diabetic needles with correct sig (now taking insulin 5 x per day.)    She does have an appointment with dr. Valentina Lucks to further titrate her DM meds.   Review of Systems     Objective:   Physical Exam VS including BP noted. Lungs clear Cardiac RRR without m or g Hyperesthesia of medial thighs bilaterally.        Assessment & Plan:

## 2017-06-07 NOTE — Assessment & Plan Note (Signed)
Refilled crestor with correct sig

## 2017-06-07 NOTE — Assessment & Plan Note (Signed)
Likely poor control.  Ordered home BP monitor.  Will adjust meds based on readings.

## 2017-06-07 NOTE — Assessment & Plan Note (Signed)
-  Counseled on diet and exercise.

## 2017-06-10 ENCOUNTER — Telehealth: Payer: Self-pay

## 2017-06-10 DIAGNOSIS — E1140 Type 2 diabetes mellitus with diabetic neuropathy, unspecified: Secondary | ICD-10-CM

## 2017-06-10 DIAGNOSIS — E11649 Type 2 diabetes mellitus with hypoglycemia without coma: Secondary | ICD-10-CM

## 2017-06-10 MED ORDER — LIRAGLUTIDE 18 MG/3ML ~~LOC~~ SOPN: 1.8000 mg | PEN_INJECTOR | Freq: Every day | SUBCUTANEOUS | 3 refills | Status: DC

## 2017-06-10 NOTE — Telephone Encounter (Signed)
Requested 90 supply and refills - sent in new Rx to [facility].

## 2017-06-10 NOTE — Telephone Encounter (Signed)
Patient left message on nurse line requesting that Victoza be resent as a 90 day supply. Danley Danker, RN [facility] RN)

## 2017-06-11 MED ORDER — LIRAGLUTIDE 18 MG/3ML ~~LOC~~ SOPN: 1.8000 mg | PEN_INJECTOR | Freq: Every day | SUBCUTANEOUS | 3 refills | Status: DC

## 2017-06-11 NOTE — Addendum Note (Signed)
Addended by: Zenia Resides on: 06/11/2017 10:02 AM   Modules accepted: Orders

## 2017-06-11 NOTE — Telephone Encounter (Signed)
Rx sent as requested.

## 2017-06-12 ENCOUNTER — Ambulatory Visit
Admission: RE | Admit: 2017-06-12 | Discharge: 2017-06-12 | Disposition: A | Discharge status: Home / Self Care | Payer: 59 | Source: Ambulatory Visit | Attending: Orthopedic Surgery | Admitting: Orthopedic Surgery

## 2017-06-12 DIAGNOSIS — M5116 Intervertebral disc disorders with radiculopathy, lumbar region: Secondary | ICD-10-CM

## 2017-06-20 ENCOUNTER — Telehealth: Payer: Self-pay | Admitting: Pharmacist

## 2017-06-20 ENCOUNTER — Ambulatory Visit: Payer: 59 | Admitting: Pharmacist

## 2017-06-20 NOTE — Telephone Encounter (Signed)
Called patient to follow up on glycemic control since last visit as she no-showed to pharmacy clinic appointment. No answer. Left HIPAA-compliant VM requesting she return my call.   Carlean Jews, Pharm.D., BCPS PGY2 Ambulatory Care Pharmacy Resident Phone: [phone]

## 2017-07-25 ENCOUNTER — Encounter: Payer: Self-pay | Admitting: Pharmacist

## 2017-07-25 ENCOUNTER — Ambulatory Visit (INDEPENDENT_AMBULATORY_CARE_PROVIDER_SITE_OTHER): Payer: Medicare Other | Admitting: Pharmacist

## 2017-07-25 DIAGNOSIS — N182 Chronic kidney disease, stage 2 (mild): Secondary | ICD-10-CM

## 2017-07-25 DIAGNOSIS — Z794 Long term (current) use of insulin: Secondary | ICD-10-CM

## 2017-07-25 DIAGNOSIS — E11649 Type 2 diabetes mellitus with hypoglycemia without coma: Secondary | ICD-10-CM

## 2017-07-25 DIAGNOSIS — E1140 Type 2 diabetes mellitus with diabetic neuropathy, unspecified: Secondary | ICD-10-CM

## 2017-07-25 MED ORDER — INSULIN DEGLUDEC 100 UNIT/ML ~~LOC~~ SOPN: 45.0000 [IU] | PEN_INJECTOR | Freq: Every day | SUBCUTANEOUS | Status: DC

## 2017-07-25 MED ORDER — INSULIN LISPRO 100 UNIT/ML (KWIKPEN): 20.0000 [IU] | PEN_INJECTOR | Freq: Two times a day (BID) | SUBCUTANEOUS | 3 refills | Status: DC

## 2017-07-25 MED ORDER — FUROSEMIDE 40 MG PO TABS: 40.0000 mg | ORAL_TABLET | Freq: Every day | ORAL | 1 refills | Status: DC

## 2017-07-25 MED ORDER — EMPAGLIFLOZIN 10 MG PO TABS: 10.0000 mg | ORAL_TABLET | Freq: Every day | ORAL | 0 refills | Status: DC

## 2017-07-25 NOTE — Assessment & Plan Note (Signed)
Diabetes longstanding currently uncontrolled. Patient reports one isolated hypoglycemic event and is able to verbalize appropriate hypoglycemia management plan. Patient reports adherence with medication. Control is suboptimal due to significant dietary indiscretion, lack of exercise, metabolic syndrome 2/2 to morbid obesity, and recent corticosteroid depot injection.  Increased dose of basal insulin Tresiba (insulin deguldec) to 45 units (+5 units) once daily. Increased dose of rapid insulin Novolog (insulin aspart) to 20 units twice daily before meals (+5 units).  Started Jardiance (empagliflozin) 10 mg once daily for both BG control and edema.

## 2017-07-25 NOTE — Assessment & Plan Note (Signed)
Increased lower extremity edema: As discussed with PCP, Dr. Andria Frames.  started furosemide 40 mg once daily for edema. Counseled to stop after weight loss of 12 lbs (dry weight achieved) or when swelling is noticeable better. Additionally counseled to prop/elevate feet up above heart when lying down to further help with LE swelling reduction.   BMET - for evaluation of kidney function.

## 2017-07-25 NOTE — Patient Instructions (Addendum)
Thank you for coming into the clinic today!  Great job checking your blood sugars everyday. Please keep doing so and bring in your meter at your next clinic visit just like you did today.  Your recent blood sugar values have been higher than normal, but this is likely due to the recent steroid shot you received for your rupture disk.   I am sorry to hear that you are in pain today. Continue to take your pain medications as needed. Contact the clinic if the medications are unable to help you with your pain.  Your fluid build up in your legs is called edema and your recent weight gain could be due to this. I want to start new medications that will help with this edema. Prop your feet up above your heart when you lie down. This can also help with your swelling.  Increase your Tresiba dose to 45 units once daily.  Increase your Humalog dose to 20 units twice daily before meals.  Start Jardiance 10 mg once daily. This can help you lose some fluid in your legs AND help lower your blood sugar.   Start Furosemide 40 mg once daily. This can help you lose fluid in your legs. Measure yourself daily. When you drop your weight down 12 lbs stop taking your furosemide. If you notice swelling is gone, stop taking your furosemide.  Follow up with Dr. Valentina Lucks in 4 weeks. Call me if you notice your BG is > 300 mg/dL.

## 2017-07-25 NOTE — Progress Notes (Signed)
S:    Patient arrives ambulating slowly without assistance but with significant pain.  Presents for diabetes evaluation, education, and management at the request of Dr. Andria Frames. Patient was referred on 06/06/17.  Patient was last seen by Primary Care Provider on 06/06/17.   Patient reports pain 7/10 in clinic. Recently dx w/ herniated L3 lumbar disk and received a corticosteroid depot injection three weeks ago. States pain did not immediately relieve but is feeling some relief starting last week.  Patient reports significant edema in lower legs that began before the corticosteroid depot injection. Has been prescribed furosemide in the past but not currently taking any loop diuretic. Reports recent weight gain +10 lbs in < 3 mo.   Patient reports good adherence with medications.  Current diabetes medications include: Tresiba 40 units daily, Humalog 15 units twice daily, metformin 500 mg twice daily, and Victoza 1.8 mg/dL  Patient reports one recent isolated hypoglycemic event. Accidentally swapped Lantus dose (40 units) with Humalog dose (15 units) at one meal last week (took 40 units of Humalog). Resulted in a >200 mg/dL drop in BG in 30 min. Pt reported receiving assistance from sisters and self-resolved with candy administration.   Patient reported dietary habits: Eating more at restaurants recently, rather than cooking at home, due to severe pain. Unhappy with this but also unable to change habits due to an inability to cook when pain is uncontrolled. Mainly [employer], pizza, etc. "anything that can be delivered or obtained quickly". Last week when pain was feeling better made grilled chicken, pork chops, and spaghetti. States an understanding to cook healthier when pain is more under control.    Patient reported exercise habits: minimal due to concurrent back pain at this time.  Current hypertension medications include: amlodipine 10 mg once daily, clonidine 0.1 mg twice daily, metoprolol  succinate 100 mg once daily, and spironolactone 25 mg once daily.  O:  Physical Exam Significant fluid edema in lower extremities with 3+ pitting edema. Worsened edema in left lower leg. Additional PE performed by Dr. Andria Frames.   Lab Results  Component Value Date   HGBA1C 9.6 03/13/2017    Home fasting CBG: 103-191 g/dL  2 hour post-prandial/random CBG: 214-283 mg/dL. 7 day avg: 210 mg/dL 14 day avg: 213 mg/dL 30 day avg: 208 mg/dL  10 year ASCVD risk: >45%. Last LDL (05/09/17) > 190 mg/dL (210 mg/dL).  A/P: Diabetes longstanding currently uncontrolled. Patient reports one isolated hypoglycemic event and is able to verbalize appropriate hypoglycemia management plan. Patient reports adherence with medication. Control is suboptimal due to significant dietary indiscretion, lack of exercise, metabolic syndrome 2/2 to morbid obesity, and recent corticosteroid depot injection.  Increased dose of basal insulin Tresiba (insulin deguldec) to 45 units (+5 units) once daily. Increased dose of rapid insulin Novolog (insulin aspart) to 20 units twice daily before meals (+5 units).  Started Jardiance (empagliflozin) 10 mg once daily for both BG control and edema.  Patient educated on purpose, proper use and potential adverse effects including UTI and infections.  Following instruction patient verbalized understanding of treatment plan.    Increased lower extremity edema: As discussed with PCP, Dr. Andria Frames.  started furosemide 40 mg once daily for edema. Counseled to stop after weight loss of 12 lbs (dry weight achieved) or when swelling is noticeable better. Additionally counseled to prop/elevate feet up above heart when lying down to further help with LE swelling reduction.  Next A1C anticipated after corticosteroid injection cycles are completed (> 1 mo  away).    ASCVD risk greater than 7.5%. Continued Aspirin 81 mg and Continued rosuvastatin 10 mg. Unable to titrate statin dose higher at this visit due  to pt reported myalgias.   Hypertension longstanding currently controlled.  Patient reports adherence with medication.  May improve with addition of empagliflozin.   Written patient instructions provided.  Total time in face to face counseling 45 minutes.   Follow up in Pharmacist Clinic Visit 4 weeks.   Patient seen with Hildred Alamin, PharmD Candidate

## 2017-07-25 NOTE — Progress Notes (Signed)
Patient ID: Rebecca Hicks, female   DOB: 1945-06-10, 72 y.o.   MRN: [id number] REveiwed: Agree with Dr. Graylin Shiver documentation and management.  Collaborated regarding diuretic therapy.

## 2017-07-26 LAB — BASIC METABOLIC PANEL
BUN/Creatinine Ratio: 21 (ref 12–28)
BUN: 26 mg/dL (ref 8–27)
CALCIUM: 9.1 mg/dL (ref 8.7–10.3)
CHLORIDE: 101 mmol/L (ref 96–106)
CO2: 21 mmol/L (ref 20–29)
CREATININE: 1.23 mg/dL — AB (ref 0.57–1.00)
GFR calc Af Amer: 51 mL/min/{1.73_m2} — ABNORMAL LOW (ref >59)
GFR calc non Af Amer: 45 mL/min/{1.73_m2} — ABNORMAL LOW (ref >59)
GLUCOSE: 191 mg/dL — AB (ref 65–99)
Potassium: 5 mmol/L (ref 3.5–5.2)
Sodium: 136 mmol/L (ref 134–144)

## 2017-08-22 ENCOUNTER — Ambulatory Visit: Payer: Medicare Other | Admitting: Pharmacist

## 2017-09-05 ENCOUNTER — Ambulatory Visit: Payer: Medicare Other | Admitting: Pharmacist

## 2017-09-05 ENCOUNTER — Encounter: Payer: Self-pay | Admitting: Pharmacist

## 2017-09-05 DIAGNOSIS — E7800 Pure hypercholesterolemia, unspecified: Secondary | ICD-10-CM | POA: Diagnosis not present

## 2017-09-05 DIAGNOSIS — I10 Essential (primary) hypertension: Secondary | ICD-10-CM

## 2017-09-05 DIAGNOSIS — E11649 Type 2 diabetes mellitus with hypoglycemia without coma: Secondary | ICD-10-CM

## 2017-09-05 DIAGNOSIS — Z794 Long term (current) use of insulin: Secondary | ICD-10-CM

## 2017-09-05 DIAGNOSIS — E1140 Type 2 diabetes mellitus with diabetic neuropathy, unspecified: Secondary | ICD-10-CM

## 2017-09-05 MED ORDER — LIRAGLUTIDE 18 MG/3ML ~~LOC~~ SOPN: 1.2000 mg | PEN_INJECTOR | Freq: Every day | SUBCUTANEOUS | 3 refills | Status: DC

## 2017-09-05 MED ORDER — INSULIN DEGLUDEC 100 UNIT/ML ~~LOC~~ SOPN: 40.0000 [IU] | PEN_INJECTOR | Freq: Every day | SUBCUTANEOUS | Status: DC

## 2017-09-05 MED ORDER — EMPAGLIFLOZIN 25 MG PO TABS: 25.0000 mg | ORAL_TABLET | Freq: Every day | ORAL | 3 refills | Status: DC

## 2017-09-05 MED ORDER — ROSUVASTATIN CALCIUM 10 MG PO TABS: 10.0000 mg | ORAL_TABLET | ORAL | 3 refills | Status: DC

## 2017-09-05 NOTE — Patient Instructions (Addendum)
Thank you for visiting [place] today!  1. INCREASE Jardiance (empagliflozin) from 10 mg daily to 25 mg daily.  2. DECREASE Victoza (liraglutide) from 1.8 mg daily to 1.2 mg daily. Increase by one click each day until you find your maximum tolerated dose.  3. DECREASE Triseba to 40 units daily.  Follow up with the pharmacy clinic in one month.

## 2017-09-05 NOTE — Assessment & Plan Note (Signed)
Hypertension longstanding currently uncontrolled on several medications.  Patient denies adherence with medication. Control is suboptimal due to suboptimal medication adherence. Patient provided a pill box and counseled on importance of medication adherence.  BMET obtained today with recent addition of furosemide and dosing changes

## 2017-09-05 NOTE — Assessment & Plan Note (Signed)
Diabetes longstanding currently uncontrolled . Patient reports hypoglycemic events about once weekly and is able to verbalize appropriate hypoglycemia management plan. Patient denies complete adherence with medication. Control is suboptimal due to concerns for suboptimal medication adherence, decreased insulin sensitivity. Increase Jardiance (empagliflozin) from 10 mg daily to 25 mg daily. Decrease Victoza (liraglutide) from 1.8 mg daily to 1.2 mg daily. Increase by one click each day until maximum tolerated dose is achieved. Decrease Tresiba to 40 units daily. Reinforced adherence to metformin 500 mg twice daily. Defer A1C today as result is not likely to influence pharmacotherapy plan at this time given elevated CBGs.

## 2017-09-05 NOTE — Progress Notes (Signed)
S:    Chief Complaint  Patient presents with  . Medication Management    Diabetes, Med adherence   Patient arrives with a slightly unsteady gait.  Presents for diabetes evaluation, education, and management as a follow-up to pharmacy clinic appt on 07/25/17. Patient was referred on 06/06/17.  Patient was last seen by Primary Care Provider Dr. Madison Hickman on 06/06/17.   Today, patient reports significant nausea and emesis several times weekly. Endorses hit-and-miss adherence to all medications, citing PM dosing as the most frequently missed meds (second dose of clonidine, metoprolol succinate, and amlodipine). Reports she has been more adherent in the last week. Patient denies taking rosuvastatin due to concerns for myalgias. Patient did not have myalgias when she was taking rosuvastatin three times weekly. Reports brisk diuresis and 22lb weight loss after starting furosemide 40 mg daily. She states that she has self-decreased the furosemide to 20 mg and continues to report diuresis at this dose. Has been enjoying retirement.   Current diabetes medications include: metformin 500 mg twice daily (pt typically only takes once daily), Victoza 1.8 mg daily, Jardiance 10 mg daily, Tresiba 40-45 units daily, Humalog 10-20 units twice daily with meals. Current hypertension medications include: amlodipine 10 mg daily , clonidine 0.1 mg twice daily, metoprolol succinate 100 mg daily, spironolactone 25 mg daily, telmisartan-HCTZ 80-12.5 mg daily, and furosemide 20-40 mg daily   Patient reports hypoglycemic events about once weekly in the early morning (3-4 AM). Patient's meter showed glucose readings of 150-300, with an average reading around 200. Patient anticipates steroid injections in the near future.   Patient takes 650 mg acetaminophen tablets and 200 mg ibuprofen at least six times daily.   O:  Physical Exam  Constitutional: She appears well-developed and well-nourished.  Musculoskeletal: She  exhibits edema (1-2+ pitting edema bilaterally).  Vitals reviewed.  Review of Systems  All other systems reviewed and are negative.  Lab Results  Component Value Date   HGBA1C 9.6 03/13/2017   Vitals:   09/05/17 1101  BP: (!) 148/92  Pulse: 76  SpO2: 98%   In office CBG: 200 about 2 hours after a banana.  A/P: Diabetes longstanding currently uncontrolled . Patient reports hypoglycemic events about once weekly and is able to verbalize appropriate hypoglycemia management plan. Patient denies complete adherence with medication. Control is suboptimal due to concerns for suboptimal medication adherence, decreased insulin sensitivity. Increase Jardiance (empagliflozin) from 10 mg daily to 25 mg daily. Decrease Victoza (liraglutide) from 1.8 mg daily to 1.2 mg daily. Increase by one click each day until maximum tolerated dose is achieved. Decrease Tresiba to 40 units daily. Reinforced adherence to metformin 500 mg twice daily. Defer A1C today as result is not likely to influence pharmacotherapy plan at this time given elevated CBGs.   ASCVD risk - elevated with multiple risk factors including DM, obesity, h/o tobacco use disorder, HTN, HLD, coronary artery calcification. Patient nonadherent to rosuvastatin therapy 2/2 fear of adverse events. Restart rosuvastatin 10 mg three times weekly.  Hypertension longstanding currently uncontrolled on several medications.  Patient denies adherence with medication. Control is suboptimal due to suboptimal medication adherence. Patient provided a pill box and counseled on importance of medication adherence.  BMET obtained today with recent addition of furosemide and dosing changes.    Written patient instructions provided.  Total time in face to face counseling 30 minutes. Follow up in Pharmacist Clinic Visit in one month.   Patient seen with Richardine Service, PharmD Candidate, Angus Seller,  PharmD, PGY1 Pharmacy Resident and Deirdre Pippins, PharmD, BCPS, PGY2  Pharmacy Resident.

## 2017-09-05 NOTE — Assessment & Plan Note (Signed)
ASCVD risk - elevated with multiple risk factors including DM, obesity, h/o tobacco use disorder, HTN, HLD, coronary artery calcification. Patient nonadherent to rosuvastatin therapy 2/2 fear of adverse events. Restart rosuvastatin 10 mg three times weekly.

## 2017-09-05 NOTE — Progress Notes (Signed)
Patient ID: Rebecca Hicks, female   DOB: July 25, 1945, 72 y.o.   MRN: [id number] Reviewed: Agree with Dr. Graylin Shiver documentation and management.

## 2017-09-06 LAB — BASIC METABOLIC PANEL
BUN/Creatinine Ratio: 19 (ref 12–28)
BUN: 27 mg/dL (ref 8–27)
CALCIUM: 9.4 mg/dL (ref 8.7–10.3)
CO2: 25 mmol/L (ref 20–29)
CREATININE: 1.44 mg/dL — AB (ref 0.57–1.00)
Chloride: 103 mmol/L (ref 96–106)
GFR calc Af Amer: 42 mL/min/{1.73_m2} — ABNORMAL LOW (ref >59)
GFR, EST NON AFRICAN AMERICAN: 37 mL/min/{1.73_m2} — AB (ref >59)
Glucose: 171 mg/dL — ABNORMAL HIGH (ref 65–99)
Potassium: 5 mmol/L (ref 3.5–5.2)
SODIUM: 142 mmol/L (ref 134–144)

## 2017-09-20 ENCOUNTER — Encounter

## 2017-09-20 ENCOUNTER — Ambulatory Visit (INDEPENDENT_AMBULATORY_CARE_PROVIDER_SITE_OTHER): Payer: Medicare Other | Admitting: Neurology

## 2017-09-20 ENCOUNTER — Encounter: Payer: Self-pay | Admitting: Neurology

## 2017-09-20 VITALS — BP 120/84 | HR 76 | Ht 66.0 in | Wt 297.4 lb

## 2017-09-20 DIAGNOSIS — E1142 Type 2 diabetes mellitus with diabetic polyneuropathy: Secondary | ICD-10-CM | POA: Insufficient documentation

## 2017-09-20 DIAGNOSIS — M5417 Radiculopathy, lumbosacral region: Secondary | ICD-10-CM | POA: Diagnosis not present

## 2017-09-20 DIAGNOSIS — G5622 Lesion of ulnar nerve, left upper limb: Secondary | ICD-10-CM | POA: Insufficient documentation

## 2017-09-20 NOTE — Progress Notes (Signed)
[facility] Clinic Note - Initial Visit   Date: 09/20/17  Rebecca Hicks MRN: [id number] DOB: [birth year]   Dear Dr. Andria Frames:  Thank you for your kind referral of Rebecca Hicks for consultation of left leg pain. Although her history is well known to you, please allow [place] to reiterate it for the purpose of our medical record. The patient was accompanied to the clinic by self.   History of Present Illness: Rebecca Hicks is a 72 y.o. right-handed African American female with hypertension, hyperlipidemia, poorly controlled diabetes mellitus (HbA1c 9.6), and morbid obesity presenting for evaluation of left leg pain.    Starting around December 2018, she began having burning and sharp pain around the left thigh, knee, and lower leg. Pain is episodic occurring about 3-5 times per day and lasting 5 minutes.  She denies numbness of the same area.  She does not have severe low back pain.  She has difficulty getting up from a chair. She has imbalance and uses a cane for the past year.  She has not done physical therapy.  She is followed by Dr. Lynann Bologna for [facility] and is scheduled to have one performed today. MRI lumbar spine from February 2019 shows disc protrusion at L3-4 with left L3-4 nerve impingement.  She was recommended to have lumbar surgery, but wanted to seek my opinion to see if there are any other options.    She has some numbness and tingling in both feet for the past three years.  Her diabetes has been poorly controlled.  She has left hand tingling due to left ulnar neuropathy, which has improved.  Out-side paper records, electronic medical record, and images have been reviewed where available and summarized as:  Lab Results  Component Value Date   TSH 3.140 08/22/2016   Lab Results  Component Value Date   HGBA1C 9.6 03/13/2017   MRI lumbar spine wo contrast 06/12/2017: 1. New large L3-4 subarticular to extraforaminal disc protrusion/extrusion with  possible exited LEFT L3 and traversing LEFT L4 nerve impingement. New L3-4 grade 1 anterolisthesis without spondylolysis. 2. Status post LEFT L5 hemilaminectomy. Progressed degenerative change of the lumbar spine. 3. Mild canal stenosis L3-4 and L4-5. Severe L3-4 through L5-S1 neural foraminal narrowing.  NCS/EMG of the left 02/21/2017: 1. Left ulnar neuropathy with slowing across the elbow, demyelinating and axon loss in type; moderate in degree electrically. 2. Left median neuropathy at or distal to the wrist, consistent with the clinical diagnosis of carpal tunnel syndrome; very mild in degree electrically.   Past Medical History:  Diagnosis Date  . Abdominal pain 12/05/2011  . Abdominal pain, left upper quadrant 12/03/2012  . Acute on chronic renal failure ([place]) 12/25/2012  . Arthritis   . 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 Ataxia 12/15/2015  . Atypical chest pain 03/28/2016  . Cataract   . CERUMEN IMPACTION, RIGHT 01/31/2010   Qualifier: Diagnosis of  By: Dianah Field MD, Marcello Moores    . Chest tightness 02/23/2014  . Chronic kidney disease (CKD), stage II (mild) 03/05/2013   Looks like baseline creat = 1.2 -1.3.  Watch for overdiuresis.   . COLONIC POLYPS, ADENOMATOUS 05/03/2009   Every five year colonoscopy due to adenomatous polyp found 04/2009    . DDD (degenerative disc disease), cervical 11/24/2016  . Diabetes mellitus   . Diabetic neuropathy, type II diabetes mellitus ([place]) 05/28/2008   Qualifier: Diagnosis of  By: Andria Frames MD, Gwyndolyn Saxon    . Diarrhea 01/21/2015  . DM (  diabetes mellitus), type 2, uncontrolled ([place]) 02/07/2007   Qualifier: Diagnosis of  By: Wynetta Emery RN, Doroteo Bradford    . 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 Essential hypertension 02/07/2007   Qualifier: Diagnosis of  By: Wynetta Emery RN, Doroteo Bradford    . Excessive sleepiness 04/07/2014  . Fatigue 05/04/2011  . Glaucoma   . GLAUCOMA NOS 02/25/2009   Qualifier: Diagnosis of  By: Andria Frames MD, Gwyndolyn Saxon    . Great toe pain 09/18/2012  . History of  nuclear stress test    Myoview 11/17: EF 64, Normal pharmacologic nuclear stress test with no evidence of prior infarct or ischemia.  Marland Kitchen Hoarseness 10/31/2016  . Hyperlipidemia   . Hypertension   . LOW BACK PAIN SYNDROME 05/28/2008   Qualifier: Diagnosis of  By: Andria Frames MD, Gwyndolyn Saxon    . Morbid obesity ([place])   . Neck pain 11/24/2016  . Neuromuscular disorder ([place])   . Obesity hypoventilation syndrome ([place]) 11/10/2015  . Obstructive sleep apnea 11/17/2015  . Otalgia of right ear 11/24/2016  . Polyneuropathy in diabetes(357.2)   . Sarcoidosis   . Sinusitis acute 10/03/2010  . 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 - She was fitted with a Small size [employer] Full Face Mask Simplus mask and heated humidification.   . Snoring 09/05/2015  . TB SKIN TEST, POSITIVE 02/07/2007   Annotation: with clear CXR mid 1990's Qualifier: History of  By: Milana Obey, Doroteo Bradford    . TOBACCO USE, QUIT 02/07/2007   Qualifier: Diagnosis of  By: Andria Frames MD, Gwyndolyn Saxon    . Tremor of both hands 12/15/2015  . U R I 03/15/2010   Qualifier: Diagnosis of  By: Andria Frames MD, Gwyndolyn Saxon      Past Surgical History:  Procedure Laterality Date  . ABDOMINAL HYSTERECTOMY    . APPENDECTOMY    . CATARACT EXTRACTION, BILATERAL  03/2010, 04/2010  . COLONOSCOPY  2011  . EYE SURGERY    . POLYPECTOMY  2011   polyps, hems   . SPINE SURGERY     lumbar laminectomy X 2  . TONSILLECTOMY       Medications:  Outpatient Encounter Medications as of 09/20/2017  Medication Sig  . acetaminophen (TYLENOL) 650 MG CR tablet Take 650 mg by mouth.  Marland Kitchen albuterol (PROVENTIL HFA;VENTOLIN HFA) 108 (90 Base) MCG/ACT inhaler Inhale 2 puffs into the lungs every 4 (four) hours as needed for wheezing or shortness of breath (cough, shortness of breath or wheezing.).  Marland Kitchen amLODipine (NORVASC) 10 MG tablet TAKE 1 TABLET BY MOUTH  DAILY  .  aspirin 81 MG tablet Take 81 mg by mouth daily.    . baclofen (LIORESAL) 10 MG tablet Take 10 mg by mouth daily as needed for muscle spasms.  . Blood Glucose Monitoring Suppl (ONE TOUCH ULTRA 2) w/Device KIT Test blood sugar three times per day.  . Blood Pressure Monitoring (BLOOD PRESSURE KIT) DEVI 1 Act by Does not apply route daily.  . cloNIDine (CATAPRES) 0.1 MG tablet TAKE 1 TABLET BY MOUTH 3  TIMES DAILY (Patient taking differently: TAKE 1 TABLET BY MOUTH 2  TIMES DAILY)  . diphenoxylate-atropine (LOMOTIL) 2.5-0.025 MG tablet Take 1 tablet by mouth 4 (four) times daily as needed for diarrhea or loose stools.  . DULoxetine (CYMBALTA) 30 MG capsule TAKE 1 CAPSULE BY MOUTH  DAILY  . empagliflozin (JARDIANCE) 25 MG TABS tablet Take 25 mg by mouth daily.  Marland Kitchen  esomeprazole (NEXIUM) 40 MG capsule TAKE 1 CAPSULE BY MOUTH  DAILY  . furosemide (LASIX) 40 MG tablet Take 1 tablet (40 mg total) by mouth daily. (Patient taking differently: Take 20 mg by mouth daily. )  . ibuprofen (ADVIL,MOTRIN) 200 MG tablet Take 200 mg by mouth every 8 (eight) hours as needed.  . insulin degludec (TRESIBA FLEXTOUCH) 100 UNIT/ML SOPN FlexTouch Pen Inject 0.4 mLs (40 Units total) into the skin daily.  . insulin lispro (HUMALOG KWIKPEN) 100 UNIT/ML KiwkPen Inject 0.2 mLs (20 Units total) into the skin 2 (two) times daily before a meal. Dispense QS month supply (Patient taking differently: Inject 10-20 Units into the skin 2 (two) times daily before a meal. Dispense QS month supply)  . Insulin Pen Needle (BD ULTRA-FINE PEN NEEDLES) 29G X 12.7MM MISC Use with insulin - now take 5 shots per day  . liraglutide (VICTOZA) 18 MG/3ML SOPN Inject 0.2 mLs (1.2 mg total) into the skin daily.  . metFORMIN (GLUCOPHAGE) 500 MG tablet Take 500 mg by mouth 2 (two) times daily with a meal.  . metoprolol succinate (TOPROL-XL) 100 MG 24 hr tablet Take 100 mg daily by mouth. Take with or immediately following a meal.  . ONE TOUCH ULTRA TEST test  strip USE TO TEST BLOOD SUGAR 3  TIMES DAILY  . pregabalin (LYRICA) 75 MG capsule Take 1 capsule (75 mg total) by mouth 2 (two) times daily. (Patient taking differently: Take 75 mg by mouth at bedtime. )  . rosuvastatin (CRESTOR) 10 MG tablet Take 1 tablet (10 mg total) by mouth 3 (three) times a week.  . spironolactone (ALDACTONE) 25 MG tablet TAKE 1 TABLET BY MOUTH  DAILY  . SYMBICORT 160-4.5 MCG/ACT inhaler INHALE 2 PUFFS INTO THE  LUNGS TWO TIMES DAILY (Patient taking differently: INHALE 2 PUFFS INTO THE  LUNGS DAILY)  . telmisartan-hydrochlorothiazide (MICARDIS HCT) 80-12.5 MG tablet TAKE 1 TABLET BY MOUTH  DAILY  . timolol (TIMOPTIC) 0.25 % ophthalmic solution Place 1 drop into both eyes daily. Per optho    No facility-administered encounter medications on file as of 09/20/2017.      Allergies:  Allergies  Allergen Reactions  . Gabapentin Other (See Comments)    Sedation, sleepiness  . Penicillins Rash    Family History: Family History  Problem Relation Age of Onset  . Heart disease Brother   . Cervical cancer Mother   . Stroke Father   . Alcohol abuse Father   . Alcohol abuse Brother   . Pancreatic cancer Maternal Uncle   . Hypertension Unknown        siblings  . Diabetes Unknown        siblings  . Breast cancer Maternal Aunt   . Stroke Paternal Grandmother   . Colon cancer Neg Hx   . Esophageal cancer Neg Hx   . Rectal cancer Neg Hx   . Stomach cancer Neg Hx     Social History: Social History   Tobacco Use  . Smoking status: Former Smoker    Packs/day: 1.00    Years: 20.00    Pack years: 20.00    Types: Cigarettes    Last attempt to quit: 04/24/1995    Years since quitting: 22.4  . Smokeless tobacco: Never Used  Substance Use Topics  . Alcohol use: No    Alcohol/week: 0.0 oz  . Drug use: No   Social History   Social History Narrative   Lives with 2 sisters and a nephew in a  2 story home but stays on the first floor.  Retired from the [employer].  Education: college.     Review of Systems:  CONSTITUTIONAL: No fevers, chills, night sweats, or weight loss.   EYES: No visual changes or eye pain ENT: No hearing changes.  No history of nose bleeds.   RESPIRATORY: No cough, wheezing and shortness of breath.   CARDIOVASCULAR: Negative for chest pain, and palpitations.   GI: Negative for abdominal discomfort, blood in stools or black stools.  No recent change in bowel habits.   GU:  No history of incontinence.   MUSCLOSKELETAL: +history of joint pain +swelling.  No myalgias.   SKIN: Negative for lesions, rash, and itching.   HEMATOLOGY/ONCOLOGY: Negative for prolonged bleeding, bruising easily, and swollen nodes.  No history of cancer.   ENDOCRINE: Negative for cold or heat intolerance, polydipsia or goiter.   PSYCH:  No depression or anxiety symptoms.   NEURO: As Above.   Vital Signs:  BP 120/84   Pulse 76   Ht 5' 6"  (1.676 m)   Wt 297 lb 6 oz (134.9 kg)   SpO2 95%   BMI 48.00 kg/m    General Medical Exam:   General:  Well appearing, comfortable, morbidly obese.   Eyes/ENT: see cranial nerve examination.   Neck: No masses appreciated.  Full range of motion without tenderness.  No carotid bruits. Respiratory:  Clear to auscultation, good air entry bilaterally.   Cardiac:  Regular rate and rhythm, no murmur.   Extremities:  No deformities, 2+ edema  Skin:  No rashes or lesions.  Neurological Exam: MENTAL STATUS including orientation to time, place, person, recent and remote memory, attention span and concentration, language, and fund of knowledge is normal.  Speech is not dysarthric.  CRANIAL NERVES: II:  No visual field defects.  Unremarkable fundi.   III-IV-VI: Pupils equal round and reactive to light.  Normal conjugate, extra-ocular eye movements in all directions of gaze.  No nystagmus.  No ptosis.   V:  Normal facial sensation.    VII:  Normal facial symmetry and movements. VIII:  Normal hearing and  vestibular function.   IX-X:  Normal palatal movement.   XI:  Normal shoulder shrug and head rotation.   XII:  Normal tongue strength and range of motion, no deviation or fasciculation.  MOTOR:  No atrophy, fasciculations or abnormal movements.  No pronator drift.  Tone is normal.    Right Upper Extremity:    Left Upper Extremity:    Deltoid  5/5   Deltoid  5/5   Biceps  5/5   Biceps  5/5   Triceps  5/5   Triceps  5/5   Wrist extensors  5/5   Wrist extensors  5/5   Wrist flexors  5/5   Wrist flexors  5/5   Finger extensors  5/5   Finger extensors  5/5   Finger flexors  5/5   Finger flexors  5/5   Dorsal interossei  5/5   Dorsal interossei  5/5   Abductor pollicis  5/5   Abductor pollicis  5/5   Tone (Ashworth scale)  0  Tone (Ashworth scale)  0   Right Lower Extremity:    Left Lower Extremity:    Hip flexors  5/5   Hip flexors  5/5   Hip extensors  5/5   Hip extensors  5/5   Knee flexors  5/5   Knee flexors  5/5   Knee extensors  5/5   Knee extensors  5/5   Dorsiflexors  5/5   Dorsiflexors  5/5   Plantarflexors  5/5   Plantarflexors  5/5   Toe extensors  5/5   Toe extensors  5/5   Toe flexors  5/5   Toe flexors  4/5   Tone (Ashworth scale)  0  Tone (Ashworth scale)  0   MSRs:  Right                                                                 Left brachioradialis 2+  brachioradialis 2+  biceps 2+  biceps 2+  triceps 2+  triceps 2+  patellar 1+  Patellar 2+  ankle jerk 0  ankle jerk 0  Hoffman no  Hoffman no  plantar response down  plantar response down   SENSORY:  Vibration is reduced at the toes bilaterally.  Mildly reduced temperature and pin prick distally.  Sensation is normal in the arms.  COORDINATION/GAIT: Normal finger-to- nose-finger.  unable to rise from a chair without using arms.  Gait is slow, wide-based due to body habitus.   IMPRESSION: 1.  Left leg paresthesia due to left L3-4 radiculopathy as seen on her recent MRI.  I viewed images with patient and  discussed management options.  She does not have lasting benefit with ESI and may benefit from lumbar surgery, which has been recommended by her providers.  She can try physical therapy and will discuss this with Dr. Lynann Bologna, but ultimately, she will need to seek surgical opinion.  I explained that her symptoms are not stemming from diabetic neuropathy which is restricted to her feet only.    2.  Distal and symmetric diabetic neuropathy affecting the feet.  Encouraged tight glycemic control.  She has adequate relief with Lyrica 75m BID.  Higher dose caused significant tremors.   3.  Left ulnar neuropathy at the elbow (moderate).  Paresthesia has improved, there is no weakness on exam. Strategies to minimize stretching and compression of the nerve was discussed.  Recommend using a soft elbow pad to protect the nerve and use as a block when sitting or sleeping to prevent hyperflexion.  Thank you for allowing me to participate in patient's care.  If I can answer any additional questions, I would be pleased to do so.    Sincerely,    Donika K. PPosey Pronto DO

## 2017-09-20 NOTE — Patient Instructions (Addendum)
If you would like to restart physical therapy, please let me know  Recommend seeing a spine surgeon for your back  Try to avoid over flexing your left arm and minimize compression of the nerve with using a soft elbow pad  Return to clinic as needed

## 2017-09-21 ENCOUNTER — Other Ambulatory Visit: Payer: Self-pay | Admitting: Emergency Medicine

## 2017-09-23 ENCOUNTER — Encounter (HOSPITAL_COMMUNITY): Payer: Self-pay | Admitting: Emergency Medicine

## 2017-09-23 ENCOUNTER — Encounter

## 2017-09-23 ENCOUNTER — Ambulatory Visit: Payer: 59 | Admitting: Neurology

## 2017-09-23 ENCOUNTER — Emergency Department (HOSPITAL_COMMUNITY)
Admission: EM | Admit: 2017-09-23 | Discharge: 2017-09-23 | Disposition: A | Discharge status: Home / Self Care | Payer: Medicare Other | Attending: Emergency Medicine | Admitting: Emergency Medicine

## 2017-09-23 ENCOUNTER — Emergency Department (HOSPITAL_COMMUNITY): Payer: Medicare Other

## 2017-09-23 DIAGNOSIS — R32 Unspecified urinary incontinence: Secondary | ICD-10-CM | POA: Insufficient documentation

## 2017-09-23 DIAGNOSIS — E1140 Type 2 diabetes mellitus with diabetic neuropathy, unspecified: Secondary | ICD-10-CM | POA: Insufficient documentation

## 2017-09-23 DIAGNOSIS — I129 Hypertensive chronic kidney disease with stage 1 through stage 4 chronic kidney disease, or unspecified chronic kidney disease: Secondary | ICD-10-CM | POA: Diagnosis not present

## 2017-09-23 DIAGNOSIS — E1122 Type 2 diabetes mellitus with diabetic chronic kidney disease: Secondary | ICD-10-CM | POA: Diagnosis not present

## 2017-09-23 DIAGNOSIS — Z79899 Other long term (current) drug therapy: Secondary | ICD-10-CM | POA: Diagnosis not present

## 2017-09-23 DIAGNOSIS — W050XXA Fall from non-moving wheelchair, initial encounter: Secondary | ICD-10-CM | POA: Insufficient documentation

## 2017-09-23 DIAGNOSIS — S82831A Other fracture of upper and lower end of right fibula, initial encounter for closed fracture: Secondary | ICD-10-CM | POA: Diagnosis not present

## 2017-09-23 DIAGNOSIS — Y939 Activity, unspecified: Secondary | ICD-10-CM | POA: Diagnosis not present

## 2017-09-23 DIAGNOSIS — Z7982 Long term (current) use of aspirin: Secondary | ICD-10-CM | POA: Diagnosis not present

## 2017-09-23 DIAGNOSIS — S8991XA Unspecified injury of right lower leg, initial encounter: Secondary | ICD-10-CM | POA: Diagnosis present

## 2017-09-23 DIAGNOSIS — Y929 Unspecified place or not applicable: Secondary | ICD-10-CM | POA: Insufficient documentation

## 2017-09-23 DIAGNOSIS — W19XXXA Unspecified fall, initial encounter: Secondary | ICD-10-CM

## 2017-09-23 DIAGNOSIS — Y999 Unspecified external cause status: Secondary | ICD-10-CM | POA: Diagnosis not present

## 2017-09-23 DIAGNOSIS — N182 Chronic kidney disease, stage 2 (mild): Secondary | ICD-10-CM | POA: Insufficient documentation

## 2017-09-23 LAB — CBC WITH DIFFERENTIAL/PLATELET
Abs Immature Granulocytes: 0 10*3/uL (ref 0.0–0.1)
Basophils Absolute: 0.1 10*3/uL (ref 0.0–0.1)
Basophils Relative: 1 %
EOS PCT: 2 %
Eosinophils Absolute: 0.2 10*3/uL (ref 0.0–0.7)
HCT: 43.4 % (ref 36.0–46.0)
HEMOGLOBIN: 13.3 g/dL (ref 12.0–15.0)
IMMATURE GRANULOCYTES: 0 %
LYMPHS PCT: 23 %
Lymphs Abs: 1.6 10*3/uL (ref 0.7–4.0)
MCH: 27.4 pg (ref 26.0–34.0)
MCHC: 30.6 g/dL (ref 30.0–36.0)
MCV: 89.3 fL (ref 78.0–100.0)
Monocytes Absolute: 0.7 10*3/uL (ref 0.1–1.0)
Monocytes Relative: 10 %
NEUTROS PCT: 64 %
Neutro Abs: 4.3 10*3/uL (ref 1.7–7.7)
Platelets: 190 10*3/uL (ref 150–400)
RBC: 4.86 MIL/uL (ref 3.87–5.11)
RDW: 14.7 % (ref 11.5–15.5)
WBC: 6.7 10*3/uL (ref 4.0–10.5)

## 2017-09-23 LAB — URINALYSIS, ROUTINE W REFLEX MICROSCOPIC
Bacteria, UA: NONE SEEN
Bilirubin Urine: NEGATIVE
Glucose, UA: >=500 mg/dL — AB
Hgb urine dipstick: NEGATIVE
Ketones, ur: NEGATIVE mg/dL
Leukocytes, UA: NEGATIVE
Nitrite: NEGATIVE
PROTEIN: 100 mg/dL — AB
SPECIFIC GRAVITY, URINE: 1.02 (ref 1.005–1.030)
pH: 5 (ref 5.0–8.0)

## 2017-09-23 LAB — BASIC METABOLIC PANEL
Anion gap: 10 (ref 5–15)
BUN: 38 mg/dL — AB (ref 6–20)
CHLORIDE: 103 mmol/L (ref 101–111)
CO2: 25 mmol/L (ref 22–32)
Calcium: 9.4 mg/dL (ref 8.9–10.3)
Creatinine, Ser: 1.48 mg/dL — ABNORMAL HIGH (ref 0.44–1.00)
GFR, EST AFRICAN AMERICAN: 40 mL/min — AB (ref >60)
GFR, EST NON AFRICAN AMERICAN: 35 mL/min — AB (ref >60)
Glucose, Bld: 172 mg/dL — ABNORMAL HIGH (ref 65–99)
POTASSIUM: 4.4 mmol/L (ref 3.5–5.1)
Sodium: 138 mmol/L (ref 135–145)

## 2017-09-23 LAB — CBG MONITORING, ED: GLUCOSE-CAPILLARY: 134 mg/dL — AB (ref 65–99)

## 2017-09-23 MED ORDER — HYDROCODONE-ACETAMINOPHEN 5-325 MG PO TABS: 1.0000 | ORAL_TABLET | ORAL | 0 refills | Status: DC | PRN

## 2017-09-23 MED ORDER — ACETAMINOPHEN 325 MG PO TABS
650.0000 mg | ORAL_TABLET | Freq: Once | ORAL | Status: AC
Start: 2017-09-23 — End: 2017-09-23
  Administered 2017-09-23: 650 mg via ORAL
  Filled 2017-09-23: qty 2

## 2017-09-23 MED ORDER — GADOBENATE DIMEGLUMINE 529 MG/ML IV SOLN
10.0000 mL | Freq: Once | INTRAVENOUS | Status: AC
  Administered 2017-09-23: 10 mL via INTRAVENOUS
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
## 2017-09-23 NOTE — Discharge Instructions (Signed)
Take pain medicine as needed for leg pain. Follow up with your doctor Return if worsening

## 2017-09-23 NOTE — ED Notes (Signed)
Patient transported to MRI 

## 2017-09-23 NOTE — Progress Notes (Signed)
Orthopedic Tech Progress Note Patient Details:  Rebecca Hicks [birth year] [id number]  Ortho Devices Type of Ortho Device: Crutches Ortho Device/Splint Location: RLE Ortho Device/Splint Interventions: Ordered, Application   Post Interventions Patient Tolerated: Well Instructions Provided: Care of device   Braulio Bosch 09/23/2017, 9:35 PM

## 2017-09-23 NOTE — ED Provider Notes (Signed)
[place] EMERGENCY DEPARTMENT Provider Note   CSN: [id number] Arrival date & time: 09/23/17  1142    History   Chief Complaint Chief Complaint  Patient presents with  . Fall    HPI Rebecca Hicks is a 72 y.o. female who presents with urinary incontinence/frequency and right leg pain. PMH significant for chronic back pain, CKD, DM, HTN, HLD. She states that she goes to [facility] for her back and was there on Friday to get back injections. She left in a wheelchair and when she got up to get in to the car her legs gave out on her and she had a fall on to her right side. She did not lose consciousness or hit her head. Since then she notes that she's been having right hip, knee, and ankle pain, difficulty walking due to pain, and urinary incontinence and frequency. She denies back pain, abdominal pain, leg weakness, numbness/tingling, saddle anesthesia, bowel incontinence. She is concerned because she is worried she may have hurt her bladder or her back as she's never had these symptoms before.   HPI  Past Medical History:  Diagnosis Date  . Abdominal pain 12/05/2011  . Abdominal pain, left upper quadrant 12/03/2012  . Acute on chronic renal failure ([place]) 12/25/2012  . Arthritis   . 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 Ataxia 12/15/2015  . Atypical chest pain 03/28/2016  . Cataract   . CERUMEN IMPACTION, RIGHT 01/31/2010   Qualifier: Diagnosis of  By: Dianah Field MD, Marcello Moores    . Chest tightness 02/23/2014  . Chronic kidney disease (CKD), stage II (mild) 03/05/2013   Looks like baseline creat = 1.2 -1.3.  Watch for overdiuresis.   . COLONIC POLYPS, ADENOMATOUS 05/03/2009   Every five year colonoscopy due to adenomatous polyp found 04/2009    . DDD (degenerative disc disease), cervical 11/24/2016  . Diabetes mellitus   . Diabetic neuropathy, type II diabetes mellitus ([place]) 05/28/2008   Qualifier: Diagnosis of  By: Andria Frames MD, Gwyndolyn Saxon    . Diarrhea 01/21/2015  . DM  (diabetes mellitus), type 2, uncontrolled ([place]) 02/07/2007   Qualifier: Diagnosis of  By: Wynetta Emery RN, Doroteo Bradford    . 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 Essential hypertension 02/07/2007   Qualifier: Diagnosis of  By: Wynetta Emery RN, Doroteo Bradford    . Excessive sleepiness 04/07/2014  . Fatigue 05/04/2011  . Glaucoma   . GLAUCOMA NOS 02/25/2009   Qualifier: Diagnosis of  By: Andria Frames MD, Gwyndolyn Saxon    . Great toe pain 09/18/2012  . History of nuclear stress test    Myoview 11/17: EF 64, Normal pharmacologic nuclear stress test with no evidence of prior infarct or ischemia.  Marland Kitchen Hoarseness 10/31/2016  . Hyperlipidemia   . Hypertension   . LOW BACK PAIN SYNDROME 05/28/2008   Qualifier: Diagnosis of  By: Andria Frames MD, Gwyndolyn Saxon    . Morbid obesity ([place])   . Neck pain 11/24/2016  . Neuromuscular disorder ([place])   . Obesity hypoventilation syndrome ([place]) 11/10/2015  . Obstructive sleep apnea 11/17/2015  . Otalgia of right ear 11/24/2016  . Polyneuropathy in diabetes(357.2)   . Sarcoidosis   . Sinusitis acute 10/03/2010  . 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 - She was fitted with a Small size [employer] Full Face Mask Simplus mask and heated humidification.   . Snoring 09/05/2015  . TB SKIN TEST, POSITIVE 02/07/2007  Annotation: with clear CXR mid 1990's Qualifier: History of  By: Milana Obey, Doroteo Bradford    . TOBACCO USE, QUIT 02/07/2007   Qualifier: Diagnosis of  By: Andria Frames MD, Gwyndolyn Saxon    . Tremor of both hands 12/15/2015  . U R I 03/15/2010   Qualifier: Diagnosis of  By: Andria Frames MD, Gwyndolyn Saxon      Patient Active Problem List   Diagnosis Date Noted  . Lumbosacral radiculopathy 09/20/2017  . Diabetic polyneuropathy associated with type 2 diabetes mellitus ([place]) 09/20/2017  . Ulnar neuropathy at elbow of left upper extremity 09/20/2017  . Coronary artery calcification 02/28/2017  . Neck pain  11/24/2016  . Otalgia of right ear 11/24/2016  . DDD (degenerative disc disease), cervical 11/24/2016  . Arthropathy 11/24/2016  . Left arm numbness 10/31/2016  . Hoarseness 10/31/2016  . Atypical chest pain 03/28/2016  . Tremor of both hands 12/15/2015  . Ataxia 12/15/2015  . Obstructive sleep apnea 11/17/2015  . 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 Periodic limb movements of sleep 11/10/2015  . Obesity hypoventilation syndrome ([place]) 11/10/2015  . Diarrhea 01/21/2015  . Chronic kidney disease (CKD), stage II (mild) 03/05/2013  . COLONIC POLYPS, ADENOMATOUS 05/03/2009  . GLAUCOMA NOS 02/25/2009  . Diabetic neuropathy, type II diabetes mellitus ([place]) 05/28/2008  . LOW BACK PAIN SYNDROME 05/28/2008  . 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 DM (diabetes mellitus), type 2, uncontrolled ([place]) 02/07/2007  . Hyperlipidemia 02/07/2007  . Morbid obesity ([place]) 02/07/2007  . Essential hypertension 02/07/2007  . TB SKIN TEST, POSITIVE 02/07/2007  . TOBACCO USE, QUIT 02/07/2007  . HYSTERECTOMY, HX OF 02/07/2007    Past Surgical History:  Procedure Laterality Date  . ABDOMINAL HYSTERECTOMY    . APPENDECTOMY    . CATARACT EXTRACTION, BILATERAL  03/2010, 04/2010  . COLONOSCOPY  2011  . EYE SURGERY    . POLYPECTOMY  2011   polyps, hems   . SPINE SURGERY     lumbar laminectomy X 2  . TONSILLECTOMY       OB History   None      Home Medications    Prior to Admission medications   Medication Sig Start Date End Date Taking? Authorizing Provider  acetaminophen (TYLENOL) 650 MG CR tablet Take 650 mg by mouth.    [provider]  albuterol (PROVENTIL HFA;VENTOLIN HFA) 108 (90 Base) MCG/ACT inhaler Inhale 2 puffs into the lungs every 4 (four) hours as needed for wheezing or shortness of breath (cough, shortness of breath or wheezing.). 05/17/16   Ivar Drape D, PA  amLODipine (NORVASC) 10 MG tablet TAKE 1 TABLET BY MOUTH  DAILY  02/18/17   Zenia Resides, MD  aspirin 81 MG tablet Take 81 mg by mouth daily.      [provider]  baclofen (LIORESAL) 10 MG tablet Take 10 mg by mouth daily as needed for muscle spasms.    [provider]  Blood Glucose Monitoring Suppl (ONE TOUCH ULTRA 2) w/Device KIT Test blood sugar three times per day. 06/06/17   Zenia Resides, MD  Blood Pressure Monitoring (BLOOD PRESSURE KIT) DEVI 1 Act by Does not apply route daily. 06/06/17   Zenia Resides, MD  cloNIDine (CATAPRES) 0.1 MG tablet TAKE 1 TABLET BY MOUTH 3  TIMES DAILY Patient taking differently: TAKE 1 TABLET BY MOUTH 2  TIMES DAILY 04/25/17   Zenia Resides, MD  diphenoxylate-atropine (LOMOTIL) 2.5-0.025 MG tablet Take 1 tablet by mouth 4 (four) times daily as  needed for diarrhea or loose stools. 10/13/15   Zenia Resides, MD  DULoxetine (CYMBALTA) 30 MG capsule TAKE 1 CAPSULE BY MOUTH  DAILY 03/27/17   Zenia Resides, MD  empagliflozin (JARDIANCE) 25 MG TABS tablet Take 25 mg by mouth daily. 09/05/17   Zenia Resides, MD  esomeprazole (NEXIUM) 40 MG capsule TAKE 1 CAPSULE BY MOUTH  DAILY 09/23/17   Collene Gobble, MD  furosemide (LASIX) 40 MG tablet Take 1 tablet (40 mg total) by mouth daily. Patient taking differently: Take 20 mg by mouth daily.  07/25/17   Zenia Resides, MD  ibuprofen (ADVIL,MOTRIN) 200 MG tablet Take 200 mg by mouth every 8 (eight) hours as needed.    [provider]  insulin degludec (TRESIBA FLEXTOUCH) 100 UNIT/ML SOPN FlexTouch Pen Inject 0.4 mLs (40 Units total) into the skin daily. 09/05/17   Zenia Resides, MD  insulin lispro (HUMALOG KWIKPEN) 100 UNIT/ML KiwkPen Inject 0.2 mLs (20 Units total) into the skin 2 (two) times daily before a meal. Dispense QS month supply Patient taking differently: Inject 10-20 Units into the skin 2 (two) times daily before a meal. Dispense QS month supply 07/25/17   Zenia Resides, MD  Insulin Pen Needle (BD ULTRA-FINE PEN NEEDLES) 29G  X 12.7MM MISC Use with insulin - now take 5 shots per day 06/06/17   Zenia Resides, MD  liraglutide (VICTOZA) 18 MG/3ML SOPN Inject 0.2 mLs (1.2 mg total) into the skin daily. 09/05/17   Zenia Resides, MD  metFORMIN (GLUCOPHAGE) 500 MG tablet Take 500 mg by mouth 2 (two) times daily with a meal.    [provider]  metoprolol succinate (TOPROL-XL) 100 MG 24 hr tablet Take 100 mg daily by mouth. Take with or immediately following a meal.    [provider]  ONE TOUCH ULTRA TEST test strip USE TO TEST BLOOD SUGAR 3  TIMES DAILY 11/14/16   Zenia Resides, MD  pregabalin (LYRICA) 75 MG capsule Take 1 capsule (75 mg total) by mouth 2 (two) times daily. Patient taking differently: Take 75 mg by mouth at bedtime.  06/06/17   Zenia Resides, MD  rosuvastatin (CRESTOR) 10 MG tablet Take 1 tablet (10 mg total) by mouth 3 (three) times a week. 09/06/17   Zenia Resides, MD  spironolactone (ALDACTONE) 25 MG tablet TAKE 1 TABLET BY MOUTH  DAILY 05/15/17   Zenia Resides, MD  SYMBICORT 160-4.5 MCG/ACT inhaler INHALE 2 PUFFS INTO THE  LUNGS TWO TIMES DAILY Patient taking differently: INHALE 2 PUFFS INTO THE  LUNGS DAILY 04/25/16   Collene Gobble, MD  telmisartan-hydrochlorothiazide (MICARDIS HCT) 80-12.5 MG tablet TAKE 1 TABLET BY MOUTH  DAILY 09/18/16   Zenia Resides, MD  timolol (TIMOPTIC) 0.25 % ophthalmic solution Place 1 drop into both eyes daily. Per optho     [provider]    Family History Family History  Problem Relation Age of Onset  . Heart disease Brother   . Cervical cancer Mother   . Stroke Father   . Alcohol abuse Father   . Alcohol abuse Brother   . Pancreatic cancer Maternal Uncle   . Hypertension Unknown        siblings  . Diabetes Unknown        siblings  . Breast cancer Maternal Aunt   . Stroke Paternal Grandmother   . Colon cancer Neg Hx   . Esophageal cancer Neg Hx   . Rectal  cancer Neg Hx   . Stomach cancer Neg Hx     Social  History Social History   Tobacco Use  . Smoking status: Former Smoker    Packs/day: 1.00    Years: 20.00    Pack years: 20.00    Types: Cigarettes    Last attempt to quit: 04/24/1995    Years since quitting: 22.4  . Smokeless tobacco: Never Used  Substance Use Topics  . Alcohol use: No    Alcohol/week: 0.0 oz  . Drug use: No     Allergies   Gabapentin and Penicillins   Review of Systems Review of Systems  Constitutional: Negative for fever.  Respiratory: Negative for shortness of breath.   Cardiovascular: Positive for leg swelling. Negative for chest pain.  Gastrointestinal: Negative for abdominal pain, nausea and vomiting.  Genitourinary: Positive for frequency. Negative for flank pain.       +incontinence  Musculoskeletal: Positive for arthralgias. Negative for back pain, gait problem and myalgias.  All other systems reviewed and are negative.    Physical Exam Updated Vital Signs BP 122/69 (BP Location: Right Arm)   Pulse 65   Temp (!) 97.5 F (36.4 C) (Oral)   Resp 18   Ht 5' 6"  (1.676 m)   Wt 127 kg (280 lb)   SpO2 95%   BMI 45.19 kg/m   Physical Exam  Constitutional: She is oriented to person, place, and time. She appears well-developed and well-nourished. No distress.  Calm and cooperative  HENT:  Head: Normocephalic and atraumatic.  Eyes: Pupils are equal, round, and reactive to light. Conjunctivae are normal. Right eye exhibits no discharge. Left eye exhibits no discharge. No scleral icterus.  Neck: Normal range of motion.  Cardiovascular: Normal rate and regular rhythm.  Pulmonary/Chest: Effort normal and breath sounds normal. No respiratory distress.  Abdominal: Soft. Bowel sounds are normal. She exhibits no distension. There is no tenderness.  Musculoskeletal:  No back tenderness. Normal strength in bilateral lower extremities. Bilateral peripheral edema of lower extremities L>R (pt states this is chronic). Tenderness over proximal calf.    Neurological: She is alert and oriented to person, place, and time.  Skin: Skin is warm and dry.  Psychiatric: She has a normal mood and affect. Her behavior is normal.  Nursing note and vitals reviewed.    ED Treatments / Results  Labs (all labs ordered are listed, but only abnormal results are displayed) Labs Reviewed  URINALYSIS, ROUTINE W REFLEX MICROSCOPIC - Abnormal; Notable for the following components:      Result Value   Glucose, UA >=500 (*)    Protein, ur 100 (*)    All other components within normal limits  BASIC METABOLIC PANEL - Abnormal; Notable for the following components:   Glucose, Bld 172 (*)    BUN 38 (*)    Creatinine, Ser 1.48 (*)    GFR calc non Af Amer 35 (*)    GFR calc Af Amer 40 (*)    All other components within normal limits  CBG MONITORING, ED - Abnormal; Notable for the following components:   Glucose-Capillary 134 (*)    All other components within normal limits  CBC WITH DIFFERENTIAL/PLATELET    EKG None  Radiology Dg Knee 2 Views Right  Result Date: 09/23/2017 CLINICAL DATA:  Acute RIGHT knee pain and swelling. Initial encounter. EXAM: RIGHT KNEE - 1-2 VIEW COMPARISON:  10/01/2015 radiographs FINDINGS: A nondisplaced proximal fibular fracture is noted. There is no evidence of knee effusion.  Moderate to severe tricompartmental joint space narrowing and osteophytosis noted. Chondrocalcinosis is identified. No dislocation identified. IMPRESSION: 1. Nondisplaced proximal fibular fracture. 2. Moderate to severe tricompartmental degenerative changes and chondrocalcinosis. Electronically Signed   By: Margarette Canada M.D.   On: 09/23/2017 13:03   Dg Tibia/fibula Right  Result Date: 09/23/2017 CLINICAL DATA:  Acute RIGHT LOWER leg pain following injury. Initial encounter. EXAM: RIGHT TIBIA AND FIBULA - 2 VIEW COMPARISON:  None. FINDINGS: A nondisplaced proximal fibular fracture is identified. No other fracture, subluxation or dislocation identified.  Moderate to severe degenerative changes in the knee present. IMPRESSION: Nondisplaced proximal fibular fracture. Electronically Signed   By: Margarette Canada M.D.   On: 09/23/2017 13:05   Dg Ankle Complete Right  Result Date: 09/23/2017 CLINICAL DATA:  Acute RIGHT ankle pain following fall. Initial encounter. EXAM: RIGHT ANKLE - COMPLETE 3+ VIEW COMPARISON:  None. FINDINGS: There is no evidence of fracture, dislocation, or joint effusion. There is no evidence of arthropathy or other focal bone abnormality. Soft tissues are unremarkable. IMPRESSION: Negative. Electronically Signed   By: Margarette Canada M.D.   On: 09/23/2017 13:00   Dg Hip Unilat  With Pelvis 2-3 Views Right  Result Date: 09/23/2017 CLINICAL DATA:  Right hip pain following a fall three days ago. EXAM: DG HIP (WITH OR WITHOUT PELVIS) 2-3V RIGHT COMPARISON:  None. FINDINGS: Normal appearing right hip without fracture or dislocation. Lower lumbar spine degenerative changes. IMPRESSION: No fracture or dislocation Electronically Signed   By: Claudie Revering M.D.   On: 09/23/2017 13:00    Procedures Procedures (including critical care time)  Medications Ordered in ED Medications  gadobenate dimeglumine (MULTIHANCE) injection 10 mL (10 mLs Intravenous Contrast Given 09/23/17 2059)  acetaminophen (TYLENOL) tablet 650 mg (650 mg Oral Given 09/23/17 2117)     Initial Impression / Assessment and Plan / ED Course  I have reviewed the triage vital signs and the nursing notes.  Pertinent labs & imaging results that were available during my care of the patient were reviewed by me and considered in my medical decision making (see chart for details).  72 year old female presents with urinary incontinence/frequency after a fall and back injections for the past 3 days. Additionally she's been having R hip, knee, leg, and ankle pain. Xrays ordered in triage are remarkable for a non-displaced R proximal fibula fracture. Will obtain a UA to r/o infection.  UA is  remarkable for >500 glucose but is not consistent with infection. Discussed with Dr. Alvino Chapel. Will order MRI of the lumbar spine. Cam walker was also ordered.  MRI is negative. Discussed results with patient who will f/u with her doctor. She was given an rx for pain medicine and a walker.   Final Clinical Impressions(s) / ED Diagnoses   Final diagnoses:  Closed fracture of proximal end of right fibula, unspecified fracture morphology, initial encounter  Urinary incontinence, unspecified type    ED Discharge Orders    None       Recardo Evangelist, PA-C 09/23/17 2213    Davonna Belling, MD 09/23/17 2243

## 2017-10-02 ENCOUNTER — Encounter: Payer: Self-pay | Admitting: Family Medicine

## 2017-10-02 ENCOUNTER — Telehealth: Payer: Self-pay

## 2017-10-02 NOTE — Telephone Encounter (Signed)
Received call on nurse line stating she received a call from Dr. Andria Frames and VM. Pt was having trouble understanding, please give her a call back. [phone].

## 2017-10-02 NOTE — Telephone Encounter (Signed)
Called and discussed.  Keep appointment for tomorrow.  No fever.  Given reassuring MRI, I do not consider this a red flag for back problems.

## 2017-10-03 ENCOUNTER — Ambulatory Visit: Payer: Medicare Other | Admitting: Family Medicine

## 2017-10-03 ENCOUNTER — Other Ambulatory Visit: Payer: Self-pay

## 2017-10-03 ENCOUNTER — Encounter: Payer: Self-pay | Admitting: Family Medicine

## 2017-10-03 VITALS — BP 154/85 | HR 72 | Temp 97.8°F | Wt 292.0 lb

## 2017-10-03 DIAGNOSIS — R350 Frequency of micturition: Secondary | ICD-10-CM | POA: Diagnosis not present

## 2017-10-03 DIAGNOSIS — S82831D Other fracture of upper and lower end of right fibula, subsequent encounter for closed fracture with routine healing: Secondary | ICD-10-CM | POA: Diagnosis not present

## 2017-10-03 DIAGNOSIS — E11649 Type 2 diabetes mellitus with hypoglycemia without coma: Secondary | ICD-10-CM

## 2017-10-03 DIAGNOSIS — N3946 Mixed incontinence: Secondary | ICD-10-CM | POA: Diagnosis not present

## 2017-10-03 DIAGNOSIS — R609 Edema, unspecified: Secondary | ICD-10-CM | POA: Diagnosis not present

## 2017-10-03 DIAGNOSIS — S82401A Unspecified fracture of shaft of right fibula, initial encounter for closed fracture: Secondary | ICD-10-CM | POA: Insufficient documentation

## 2017-10-03 LAB — POCT GLYCOSYLATED HEMOGLOBIN (HGB A1C): HBA1C, POC (CONTROLLED DIABETIC RANGE): 8.3 % — AB (ref 0.0–7.0)

## 2017-10-03 LAB — POCT URINALYSIS DIP (MANUAL ENTRY)
BILIRUBIN UA: NEGATIVE
Blood, UA: NEGATIVE
Glucose, UA: 500 mg/dL — AB
Ketones, POC UA: NEGATIVE mg/dL
LEUKOCYTES UA: NEGATIVE
NITRITE UA: NEGATIVE
PH UA: 6 (ref 5.0–8.0)
Spec Grav, UA: 1.015 (ref 1.010–1.025)
Urobilinogen, UA: 0.2 E.U./dL

## 2017-10-03 LAB — POCT UA - MICROSCOPIC ONLY

## 2017-10-03 MED ORDER — TOLTERODINE TARTRATE 1 MG PO TABS: 1.0000 mg | ORAL_TABLET | Freq: Every day | ORAL | 3 refills | Status: DC

## 2017-10-03 NOTE — Assessment & Plan Note (Signed)
Control improved but still sub optimal.  FU with Dr. Valentina Lucks.

## 2017-10-03 NOTE — Progress Notes (Signed)
   Subjective:    Patient ID: Rebecca Hicks, female    DOB: 1945/07/22, 72 y.o.   MRN: [id number]  HPI  [place] coming out of ortho for bilateral epidural steroid injections.  Suffered fibular fracture on right.  Gait recovering OK.  Reviewed films.  Proximal, non displaced fibular fracture. Has had urinary frequency and incontenence since.  She has the sense that she needs to go and cannot hold her urine during the short trip to the bathroom.  Concerned that this started after her epidural steroid injection and fall.  Of note, she is also on an SGLP 2 drug with dose increased mid July - so she has some significant glycosuria.   I reviewed MRI of lumbar spine that is reassuring for now change, no spinal cord impingement.  Denies rectal incontinence and saddle anesthesia.   She was also having fluid retention, I started on lasix, which prompted a prompt diuresis, leading to the low wt of 280.  She stopped the lasix and has rebounded to 292 today.    Review of Systems     Objective:   Physical ExamLungs clear Cardiac RRR without m or g Ext 2+ edema. Minimal tenderness over right proximal fibula        Assessment & Plan:

## 2017-10-03 NOTE — Assessment & Plan Note (Signed)
Mixed etiology.  Suspect SGLP2 and lasix playing a role.  Bilateral steroid injections may have cause some temporary dysfunction.  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  Discussed.  Nighttime symptoms are particularly worrisome.  Will try detrol at night only

## 2017-10-03 NOTE — Patient Instructions (Signed)
For now, let's dose the lasix based on your weight.  If your weight creeps above 290, take lasix that day.  If your weight is less than 290, skip the lasix. I am giving you a medication that should help the urine at night.  Call me in a week or two and let know.  We have many options when you call 1. Stop it - either because it is not working or the problem has gone away. 2. Increase the dose. 3. Give you a second dose to cover the daytime.   Stay out of the boot.  You do not need it.  The fibula fracture should heal fine without problem.

## 2017-10-03 NOTE — Assessment & Plan Note (Signed)
I have not fully worked up.  She has normal EF based on gated pool scan.  Suspect element of diastolic dysfunction CHF.  Consider echo in future.  For now, dose lasix to keep wt less than 290/

## 2017-10-03 NOTE — Assessment & Plan Note (Signed)
Doing well.  Anticipate uneventful healing.

## 2017-10-10 ENCOUNTER — Ambulatory Visit: Payer: Medicare Other | Admitting: Pharmacist

## 2017-10-14 LAB — HM DIABETES EYE EXAM

## 2017-10-23 ENCOUNTER — Telehealth: Payer: Self-pay

## 2017-10-23 NOTE — Telephone Encounter (Signed)
Rash on lateral aspect of breast x 1 week.  No lump.  Can't diagnose rash over phone.  Highly doubt needs diagnostic mammo.  She will see me next week if rash persists.

## 2017-10-23 NOTE — Telephone Encounter (Signed)
Patient called [facility] for screening mammogram. She mentioned some left breast tenderness and also a rashy area below left axilla. Does not know if they are related but [facility] wanted her to see if PCP wants to order a diagnostic mammogram.  Call back is [phone]  Danley Danker, RN [facility] RN)

## 2017-10-25 ENCOUNTER — Ambulatory Visit: Payer: Medicare Other | Admitting: Pharmacist

## 2017-10-28 ENCOUNTER — Encounter: Payer: Self-pay | Admitting: Family Medicine

## 2017-10-28 DIAGNOSIS — E11319 Type 2 diabetes mellitus with unspecified diabetic retinopathy without macular edema: Secondary | ICD-10-CM | POA: Insufficient documentation

## 2017-10-31 ENCOUNTER — Encounter: Payer: Self-pay | Admitting: Family Medicine

## 2017-10-31 ENCOUNTER — Other Ambulatory Visit: Payer: Self-pay

## 2017-10-31 ENCOUNTER — Ambulatory Visit: Payer: Medicare Other | Admitting: Family Medicine

## 2017-10-31 DIAGNOSIS — E2839 Other primary ovarian failure: Secondary | ICD-10-CM | POA: Diagnosis not present

## 2017-10-31 DIAGNOSIS — B029 Zoster without complications: Secondary | ICD-10-CM

## 2017-10-31 MED ORDER — GABAPENTIN 100 MG PO CAPS: 100.0000 mg | ORAL_CAPSULE | Freq: Three times a day (TID) | ORAL | 3 refills | Status: DC

## 2017-10-31 NOTE — Patient Instructions (Signed)
Try the gabapentin for the shooting breast pain.  Maybe it will also help your back. Go ahead and schedule the mammogram. Good luck with Dr. Laurey Arrow and with the retinal specialist.

## 2017-11-01 ENCOUNTER — Encounter: Payer: Self-pay | Admitting: Family Medicine

## 2017-11-01 NOTE — Assessment & Plan Note (Signed)
Best clinical diagnosis is shingles with the cluster of lesions (dried, unroofed vesicles?) and the characteristic pain.  Now too late in course to be infective or to benefit from antiviral therapy.  It is far enough out to treat with gabapentin as post herpetic neuralgia.  OK to get mammogram.

## 2017-11-01 NOTE — Progress Notes (Signed)
   Subjective:    Patient ID: Rebecca Hicks, female    DOB: [birth year], 72 y.o.   MRN: [id number]  HPI While Ms Bernardini has several things going on, today's visit is focused on the issue of a rash near the breast.  Rash began 3 weeks ago and she postponed her mammogram because of it.  The rash seems to be slowly resolving.  She has pain still which is shooting, wrapping from the back to the anterior breast.  Other issues that I am not addressing:  Diabetes, sub optimal control.  Has FU appointment with Dr. Valentina Lucks Diabetic retinopathy.  Has appointment with retinal specialist.    Review of Systems     Objective:   Physical Exam  Midaxillary line rash near the tail of the breast with 4-5 punctate dry lesions in a cluster with the total rash only being a quarter to half dollar sized.  No mass in tail of the breast.  It is NOT peau de orange skin.        Assessment & Plan:

## 2017-11-07 ENCOUNTER — Ambulatory Visit: Payer: Medicare Other | Admitting: Pharmacist

## 2017-11-08 ENCOUNTER — Ambulatory Visit: Payer: Medicare Other | Attending: Orthopedic Surgery | Admitting: Rehabilitation

## 2017-11-08 ENCOUNTER — Encounter: Payer: Self-pay | Admitting: Rehabilitation

## 2017-11-08 ENCOUNTER — Telehealth: Payer: Self-pay | Admitting: Pharmacist

## 2017-11-08 DIAGNOSIS — R2689 Other abnormalities of gait and mobility: Secondary | ICD-10-CM | POA: Diagnosis present

## 2017-11-08 DIAGNOSIS — M6281 Muscle weakness (generalized): Secondary | ICD-10-CM | POA: Diagnosis not present

## 2017-11-08 DIAGNOSIS — M545 Low back pain: Secondary | ICD-10-CM | POA: Diagnosis present

## 2017-11-08 DIAGNOSIS — R208 Other disturbances of skin sensation: Secondary | ICD-10-CM

## 2017-11-08 DIAGNOSIS — R2681 Unsteadiness on feet: Secondary | ICD-10-CM | POA: Diagnosis present

## 2017-11-08 DIAGNOSIS — G8929 Other chronic pain: Secondary | ICD-10-CM

## 2017-11-08 NOTE — Therapy (Signed)
[facility] [address] [place], [place], [address] Phone: [phone]   Fax:  [phone]  Physical Therapy Evaluation  Patient Details  Name: Rebecca Hicks MRN: [id number] Date of Birth: 06-Apr-1946 Referring Provider: Almedia Balls, MD   Encounter Date: 11/08/2017  PT End of Session - 11/08/17 1258    Visit Number  1    Number of Visits  17    Date for PT Re-Evaluation  19/50/93 cert for 90 days, plan of Care for 60 days    Authorization Type  [employer] Medicare    PT Start Time  0932    PT Stop Time  1016    PT Time Calculation (min)  44 min    Activity Tolerance  Patient tolerated treatment well    Behavior During Therapy  [facility] for tasks assessed/performed       Past Medical History:  Diagnosis Date  . Abdominal pain 12/05/2011  . Abdominal pain, left upper quadrant 12/03/2012  . Acute on chronic renal failure ([place]) 12/25/2012  . Arthritis   . 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 Ataxia 12/15/2015  . Atypical chest pain 03/28/2016  . Cataract   . CERUMEN IMPACTION, RIGHT 01/31/2010   Qualifier: Diagnosis of  By: Dianah Field MD, Marcello Moores    . Chest tightness 02/23/2014  . Chronic kidney disease (CKD), stage II (mild) 03/05/2013   Looks like baseline creat = 1.2 -1.3.  Watch for overdiuresis.   . COLONIC POLYPS, ADENOMATOUS 05/03/2009   Every five year colonoscopy due to adenomatous polyp found 04/2009    . DDD (degenerative disc disease), cervical 11/24/2016  . Diabetes mellitus   . Diabetic neuropathy, type II diabetes mellitus ([place]) 05/28/2008   Qualifier: Diagnosis of  By: Andria Frames MD, Gwyndolyn Saxon    . Diarrhea 01/21/2015  . DM (diabetes mellitus), type 2, uncontrolled ([place]) 02/07/2007   Qualifier: Diagnosis of  By: Wynetta Emery RN, Doroteo Bradford    . 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 Essential hypertension 02/07/2007   Qualifier: Diagnosis of  By: Wynetta Emery RN, Doroteo Bradford    . Excessive sleepiness 04/07/2014  . Fatigue 05/04/2011  .  Glaucoma   . GLAUCOMA NOS 02/25/2009   Qualifier: Diagnosis of  By: Andria Frames MD, Gwyndolyn Saxon    . Great toe pain 09/18/2012  . History of nuclear stress test    Myoview 11/17: EF 64, Normal pharmacologic nuclear stress test with no evidence of prior infarct or ischemia.  Marland Kitchen Hoarseness 10/31/2016  . Hyperlipidemia   . Hypertension   . LOW BACK PAIN SYNDROME 05/28/2008   Qualifier: Diagnosis of  By: Andria Frames MD, Gwyndolyn Saxon    . Morbid obesity ([place])   . Neck pain 11/24/2016  . Neuromuscular disorder ([place])   . Obesity hypoventilation syndrome ([place]) 11/10/2015  . Obstructive sleep apnea 11/17/2015  . Otalgia of right ear 11/24/2016  . Polyneuropathy in diabetes(357.2)   . Sarcoidosis   . Sinusitis acute 10/03/2010  . 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 - She was fitted with a Small size [employer] Full Face Mask Simplus mask and heated humidification.   . Snoring 09/05/2015  . TB SKIN TEST, POSITIVE 02/07/2007   Annotation: with clear CXR mid 1990's Qualifier: History of  By: Milana Obey, Doroteo Bradford    . TOBACCO USE, QUIT 02/07/2007   Qualifier: Diagnosis of  By: Andria Frames MD, Gwyndolyn Saxon    . Tremor  of both hands 12/15/2015  . U R I 03/15/2010   Qualifier: Diagnosis of  By: Andria Frames MD, Gwyndolyn Saxon      Past Surgical History:  Procedure Laterality Date  . ABDOMINAL HYSTERECTOMY    . APPENDECTOMY    . CATARACT EXTRACTION, BILATERAL  03/2010, 04/2010  . COLONOSCOPY  2011  . EYE SURGERY    . POLYPECTOMY  2011   polyps, hems   . SPINE SURGERY     lumbar laminectomy X 2  . TONSILLECTOMY      There were no vitals filed for this visit.   Subjective Assessment - 11/08/17 0941    Subjective  Pt reports history of back problems starting in 2004 with back surgery (x 2).  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  From that point on, has had many  back issues and pain.  Notes degeneration and disc slips.  She reports that now that she is retired she would like to regain a good quality of life improving all aspects of health including her balance and mobility.     Pertinent History  DM, HTN, falls, spinal stenosis of lumbar region, fibular fracture 6/3-mostly healed, has been getting injections in back    Limitations  Walking;Standing    Patient Stated Goals  "I want to be able to get some exercise to get this weight off so my back feels better."     Currently in Pain?  Yes    Pain Score  4     Pain Location  Back    Pain Orientation  Lower    Pain Descriptors / Indicators  Aching    Pain Type  Chronic pain    Pain Onset  More than a month ago    Pain Frequency  Intermittent    Aggravating Factors   walking, activity    Multiple Pain Sites  Yes    Pain Score  4    Pain Location  Knee    Pain Orientation  Right    Pain Descriptors / Indicators  Aching    Pain Type  Chronic pain    Pain Onset  More than a month ago    Pain Frequency  Intermittent    Aggravating Factors   walking, WB    Pain Relieving Factors  rest, ice/heat, Tiger balm          [facility] PT Assessment - 11/08/17 0950      Assessment   Medical Diagnosis  balance, weakness    Referring Provider  Almedia Balls, MD    Onset Date/Surgical Date  -- Has had declining balance/strength and inc back pain since 3      Precautions   Precautions  Fall;Back      Balance Screen   Has the patient fallen in the past 6 months  Yes    How many times?  1    Has the patient had a decrease in activity level because of a fear of falling?   Yes    Is the patient reluctant to leave their home because of a fear of falling?   Yes      [place] residence    Living Arrangements  Non-relatives/Friends Nephew and 2 sisters    Available Help at Discharge  Family sisters are not in good health    Type of [place] to enter  Entrance Stairs-Number of Steps  2    Entrance Stairs-Rails  None    Home Layout  Able to live on main level with bedroom/bathroom    [place] - single point;Walker - 2 wheels;Grab bars - tub/shower;Shower seat      Prior Function   Level of Independence  Independent uses cane intermittently    Vocation  Retired    Leisure  go to farms and pick food, go to [employer]   Overall Cognitive Status  Within Functional Limits for tasks assessed      Sensation   Light Touch  Impaired Detail    Light Touch Impaired Details  Impaired RLE;Impaired LLE neuropathy L>R, L big toe nerve damage    Hot/Cold  Appears Intact    Proprioception  Appears Intact      Coordination   Gross Motor Movements are Fluid and Coordinated  Yes    Fine Motor Movements are Fluid and Coordinated  Yes      ROM / Strength   AROM / PROM / Strength  Strength      Strength   Overall Strength  Deficits    Overall Strength Comments  seated MMT: L hip flex 3/5, R hip flex 3+/5, L and R knee ext 4/5, R knee flex 3+/5, L knee flex 3/5, R ankle DF 4/5, L ankle DF 3-/5.  Likely has hip abd weakness due to Trendelenburg gait, esp on LLE.       Transfers   Transfers  Sit to Stand;Stand to Sit    Sit to Stand  6: Modified independent (Device/Increase time)    Five time sit to stand comments   23.29 secs with BUE support, unable to come to full stand without support.  Note that she uses arms then adjusts feet then stands and then vice versa.     Stand to Sit  6: Modified independent (Device/Increase time)      Ambulation/Gait   Ambulation/Gait  Yes    Ambulation/Gait Assistance  5: Supervision;4: Min guard    Ambulation/Gait Assistance Details  min/guard for safety when making turns.     Ambulation Distance (Feet)  115 Feet    Assistive device  None    Gait Pattern  Step-through pattern;Decreased arm swing - right;Decreased arm swing - left;Decreased stride length;Decreased dorsiflexion -  right;Decreased dorsiflexion - left;Right foot flat;Left foot flat;Trendelenburg;Lateral hip instability;Lateral trunk lean to left;Wide base of support;Trunk flexed    Ambulation Surface  Level;Indoor    Gait velocity  2.42 ft/sec     Stairs  Yes    Stairs Assistance  6: Modified independent (Device/Increase time)    Stair Management Technique  Two rails;Step to pattern;Forwards    Number of Stairs  4    Height of Stairs  6      Standardized Balance Assessment   Standardized Balance Assessment  Timed Up and Go Test      Timed Up and Go Test   TUG  Normal TUG    Normal TUG (seconds)  12.25 without device                 Objective measurements completed on examination: See above findings.              PT Education - 11/08/17 1257    Education Details  Education on evaluation findings, POC, goals, also began discussion of use of rollator in order to allow her to ambulate longer  distances and be more independent in community.     Person(s) Educated  Patient    Methods  Explanation    Comprehension  Verbalized understanding       PT Short Term Goals - 11/08/17 1307      PT SHORT TERM GOAL #1   Title  Pt will be independent with initial HEP in order to indicate improved functional mobility.  (Target Date: 12/08/17)    Time  4    Period  Weeks    Status  New    Target Date  12/08/17      PT SHORT TERM GOAL #2   Title  Will assess DGI and improve score by 3 points from baseline in order to indicate decreased fall risk.      Status  New      PT SHORT TERM GOAL #3   Title  Will assess 6MWT and improve distance by 55' from baseline in order to indicate improved functional endurance.     Status  New      PT SHORT TERM GOAL #4   Title  Pt will perform 5TSS in </=20.29 secs with single UE support and without making adjustment to LEs during transition in order to indicate decreased fall risk and improved functional strength.     Status  New      PT SHORT TERM  GOAL #5   Title  Pt will ambulate x 300' w/ rollator at mod I level over indoor sufaces in order to indicate safe indoor community mobility.      Status  New      Additional Short Term Goals   Additional Short Term Goals  Yes      PT SHORT TERM GOAL #6   Title  Pt will ambulate w/ LRAD with gait speed >/=2.62 ft/sec in order to indicate safe community ambulator.     Status  New        PT Long Term Goals - 11/08/17 1309      PT LONG TERM GOAL #1   Title  Pt will be independent with final HEP and report transition to [employer] program in order to maintain mobility gains made in therapy.  (Target Date: 01/07/18)    Time  8    Period  Weeks    Status  New    Target Date  01/07/18      PT LONG TERM GOAL #2   Title  Pt will improve DGI >19/24 in order to indicate decreased fall risk.      Status  New      PT LONG TERM GOAL #3   Title  Pt will improve 6MWT w/ LRAD by 150' from baseline in order to indicate improved functional endurance.     Status  New      PT LONG TERM GOAL #4   Title  Pt will ambulate up to 500' w/ LRAD over indoor/level and unlevel outdoor paved surfaces at mod I level in order to indicate safe community mobility.      Status  New      PT LONG TERM GOAL #5   Title  Pt will report return to shopping at farmer's market and cooking at home in order to indicate functional improvement in endurance and standing tolerance.     Status  New             Plan - 11/08/17 1259    Clinical Impression Statement  Pt presents with history  of several back issues including several surgeries, has spinal stenosis (not surgical candidiate due to co-morbidities), reports degeneration of spine and several "slipped discs." She reports fall in 2008 which resulted in many injuries and has had more issues with LE strength, back pain and just general decreased mobility since then.  She has been getting injections in spine and note that she had a fall following one of these  injection 6/3 in which she broke R proximal fibula (non-displaced, no sx).  Pt lives with sisters that have varying health issues and are both on dialysis.  She reports that she would like to begin eating healthier and cooking at home.  Nephew is also assisting with this.  Upon PT evaluation, note BLE weakness (L>R), decreased sensation in BLEs (L>R) due to neuropathy, 5TSS time of 23.29 with BUE support and maximal effort with UEs to complete task indicative of elevated fall risk and decreased functional strength, gait speed of 2.42 ft/sec without device but with many gait deviations leading to increased fall risk and decreased community ambulation, and TUG time of 12.25 secs which is [facility].  Pt will benefit from skilled OP neuro to address deficits.     History and Personal Factors relevant to plan of care:  see above    Clinical Presentation  Evolving    Clinical Presentation due to:  see above    Clinical Decision Making  Moderate    Rehab Potential  Good    Clinical Impairments Affecting Rehab Potential  co-morbidities including spinal stenosis and B knee pain    PT Frequency  2x / week    PT Duration  8 weeks    PT Treatment/Interventions  ADLs/Self Care Home Management;DME Instruction;Gait training;Stair training;Functional mobility training;Therapeutic activities;Therapeutic exercise;Balance training;Neuromuscular re-education;Patient/family education;Orthotic Fit/Training;Passive range of motion;Energy conservation;Vestibular    PT Next Visit Plan  DGI, 6MWT (I am wondering if she would do better with a rollator, maybe doing a little training with rollator and then do 6MWT?) we did discuss use of rollator in community to allow increased independence and decrease back pain.  Initiate HEP for BLE strengthinging (SIT<>STAND!) and balance.     Consulted and Agree with Plan of Care  Patient       Patient will benefit from skilled therapeutic intervention in order to improve the following deficits  and impairments:  Abnormal gait, Decreased activity tolerance, Decreased balance, Decreased endurance, Decreased knowledge of use of DME, Decreased mobility, Decreased range of motion, Decreased strength, Difficulty walking, Impaired perceived functional ability, Impaired flexibility, Impaired sensation, Postural dysfunction, Obesity  Visit Diagnosis: Muscle weakness (generalized)  Chronic midline low back pain, with sciatica presence unspecified  Unsteadiness on feet  Other abnormalities of gait and mobility  Other disturbances of skin sensation     Problem List Patient Active Problem List   Diagnosis Date Noted  . Shingles 10/31/2017  . Estrogen deficiency 10/31/2017  . Diabetic retinopathy ([place]) 10/28/2017  . Mixed stress and urge urinary incontinence 10/03/2017  . Closed right fibular fracture 10/03/2017  . Dependent edema 10/03/2017  . Lumbosacral radiculopathy 09/20/2017  . Diabetic polyneuropathy associated with type 2 diabetes mellitus ([place]) 09/20/2017  . Ulnar neuropathy at elbow of left upper extremity 09/20/2017  . Coronary artery calcification 02/28/2017  . Neck pain 11/24/2016  . Otalgia of right ear 11/24/2016  . DDD (degenerative disc disease), cervical 11/24/2016  . Arthropathy 11/24/2016  . Left arm numbness 10/31/2016  . Hoarseness 10/31/2016  . Atypical chest pain 03/28/2016  . Tremor of  both hands 12/15/2015  . Ataxia 12/15/2015  . Obstructive sleep apnea 11/17/2015  . 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 Periodic limb movements of sleep 11/10/2015  . Obesity hypoventilation syndrome ([place]) 11/10/2015  . Chronic kidney disease (CKD), stage II (mild) 03/05/2013  . COLONIC POLYPS, ADENOMATOUS 05/03/2009  . GLAUCOMA NOS 02/25/2009  . Diabetic neuropathy, type II diabetes mellitus ([place]) 05/28/2008  . LOW BACK PAIN SYNDROME 05/28/2008  . 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 DM (diabetes mellitus), type 2,  uncontrolled ([place]) 02/07/2007  . Hyperlipidemia 02/07/2007  . Morbid obesity ([place]) 02/07/2007  . Essential hypertension 02/07/2007  . TB SKIN TEST, POSITIVE 02/07/2007  . TOBACCO USE, QUIT 02/07/2007  . HYSTERECTOMY, HX OF 02/07/2007    Cameron Sprang, PT, MPT [facility] [address] [place], [place], [address] Phone: [phone]   Fax:  [phone] 11/08/17, 1:15 PM  Name: Rebecca Hicks MRN: [id number] Date of Birth: [birth year]

## 2017-11-08 NOTE — Telephone Encounter (Signed)
Called in f/u of missed appt previous day. States she slept in and missed appt due to sleepiness related to gabapentin.  She provided updates that her A1C was 8.3 recently and her CBGs have been 100-150.  Notes recent steroid shots.   She plans to work with PT rather than have back surgery at this time.   Encouraged slow and steady progress with activity and weight loss.  She appreciated call.

## 2017-11-11 ENCOUNTER — Ambulatory Visit: Payer: Medicare Other | Admitting: Rehabilitation

## 2017-11-11 ENCOUNTER — Encounter: Payer: Self-pay | Admitting: Rehabilitation

## 2017-11-11 DIAGNOSIS — R2681 Unsteadiness on feet: Secondary | ICD-10-CM

## 2017-11-11 DIAGNOSIS — M6281 Muscle weakness (generalized): Secondary | ICD-10-CM | POA: Diagnosis not present

## 2017-11-11 DIAGNOSIS — R208 Other disturbances of skin sensation: Secondary | ICD-10-CM

## 2017-11-11 DIAGNOSIS — R2689 Other abnormalities of gait and mobility: Secondary | ICD-10-CM

## 2017-11-11 NOTE — Therapy (Signed)
[facility] [address] [place], [place], [address] Phone: [phone]   Fax:  [phone]  Physical Therapy Treatment  Patient Details  Name: Rebecca Hicks MRN: [id number] Date of Birth: March 08, 1946 Referring Provider: Almedia Balls, MD   Encounter Date: 11/11/2017  PT End of Session - 11/11/17 2034    Visit Number  2    Number of Visits  17    Date for PT Re-Evaluation  10/16/92 cert for 90 days, plan of Care for 60 days    Authorization Type  [employer] Medicare    PT Start Time  1408    PT Stop Time  1450    PT Time Calculation (min)  42 min    Activity Tolerance  Patient tolerated treatment well    Behavior During Therapy  [facility] for tasks assessed/performed       Past Medical History:  Diagnosis Date  . Abdominal pain 12/05/2011  . Abdominal pain, left upper quadrant 12/03/2012  . Acute on chronic renal failure ([place]) 12/25/2012  . Arthritis   . 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 Ataxia 12/15/2015  . Atypical chest pain 03/28/2016  . Cataract   . CERUMEN IMPACTION, RIGHT 01/31/2010   Qualifier: Diagnosis of  By: Dianah Field MD, Marcello Moores    . Chest tightness 02/23/2014  . Chronic kidney disease (CKD), stage II (mild) 03/05/2013   Looks like baseline creat = 1.2 -1.3.  Watch for overdiuresis.   . COLONIC POLYPS, ADENOMATOUS 05/03/2009   Every five year colonoscopy due to adenomatous polyp found 04/2009    . DDD (degenerative disc disease), cervical 11/24/2016  . Diabetes mellitus   . Diabetic neuropathy, type II diabetes mellitus ([place]) 05/28/2008   Qualifier: Diagnosis of  By: Andria Frames MD, Gwyndolyn Saxon    . Diarrhea 01/21/2015  . DM (diabetes mellitus), type 2, uncontrolled ([place]) 02/07/2007   Qualifier: Diagnosis of  By: Wynetta Emery RN, Doroteo Bradford    . 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 Essential hypertension 02/07/2007   Qualifier: Diagnosis of  By: Wynetta Emery RN, Doroteo Bradford    . Excessive sleepiness 04/07/2014  . Fatigue 05/04/2011  .  Glaucoma   . GLAUCOMA NOS 02/25/2009   Qualifier: Diagnosis of  By: Andria Frames MD, Gwyndolyn Saxon    . Great toe pain 09/18/2012  . History of nuclear stress test    Myoview 11/17: EF 64, Normal pharmacologic nuclear stress test with no evidence of prior infarct or ischemia.  Mar[place] Kitchen Hoarseness 10/31/2016  . Hyperlipidemia   . Hypertension   . LOW BACK PAIN SYNDROME 05/28/2008   Qualifier: Diagnosis of  By: Andria Frames MD, Gwyndolyn Saxon    . Morbid obesity ([place])   . Neck pain 11/24/2016  . Neuromuscular disorder ([place])   . Obesity hypoventilation syndrome ([place]) 11/10/2015  . Obstructive sleep apnea 11/17/2015  . Otalgia of right ear 11/24/2016  . Polyneuropathy in diabetes(357.2)   . Sarcoidosis   . Sinusitis acute 10/03/2010  . 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 - She was fitted with a Small size [employer] Full Face Mask Simplus mask and heated humidification.   . Snoring 09/05/2015  . TB SKIN TEST, POSITIVE 02/07/2007   Annotation: with clear CXR mid 1990's Qualifier: History of  By: Milana Obey, Doroteo Bradford    . TOBACCO USE, QUIT 02/07/2007   Qualifier: Diagnosis of  By: Andria Frames MD, Gwyndolyn Saxon    . Tremor  of both hands 12/15/2015  . U R I 03/15/2010   Qualifier: Diagnosis of  By: Andria Frames MD, Gwyndolyn Saxon      Past Surgical History:  Procedure Laterality Date  . ABDOMINAL HYSTERECTOMY    . APPENDECTOMY    . CATARACT EXTRACTION, BILATERAL  03/2010, 04/2010  . COLONOSCOPY  2011  . EYE SURGERY    . POLYPECTOMY  2011   polyps, hems   . SPINE SURGERY     lumbar laminectomy X 2  . TONSILLECTOMY      There were no vitals filed for this visit.  Subjective Assessment - 11/11/17 1411    Subjective  Pt reports no changes, feeling well.     Pertinent History  DM, HTN, falls, spinal stenosis of lumbar region, fibular fracture 6/3-mostly healed, has been getting injections in back    Limitations   Walking;Standing    Patient Stated Goals  "I want to be able to get some exercise to get this weight off so my back feels better."     Currently in Pain?  No/denies                       [facility] Adult PT Treatment/Exercise - 11/11/17 1412      Ambulation/Gait   Ambulation/Gait  Yes    Ambulation/Gait Assistance  5: Supervision    Ambulation/Gait Assistance Details  Began training with bariatric rollator during session to prepare for 6MWT.  Note that clinic's bariatric rollator could not be adjusted properly however PT was able to set up enough for her to do short bout of gait with rollator.  She and PT note more upright posture, improved gait speed, and good return demonstration of using brakes to lock, then sit when pt fatigued.  Will work on getting rollator fixed prior to next session and PT to send request for order to MD.  Pt verbalized understanding.     Ambulation Distance (Feet)  115 Feet    Assistive device  4-wheeled walker    Gait Pattern  Step-through pattern;Decreased arm swing - right;Decreased arm swing - left;Decreased stride length;Decreased dorsiflexion - right;Decreased dorsiflexion - left;Right foot flat;Left foot flat;Trendelenburg;Lateral hip instability;Lateral trunk lean to left;Wide base of support;Trunk flexed    Ambulation Surface  Level;Indoor    Stairs  Yes    Stairs Assistance  6: Modified independent (Device/Increase time)    Stair Management Technique  Two rails;Step to pattern;Forwards    Number of Stairs  4    Height of Stairs  6      Standardized Balance Assessment   Standardized Balance Assessment  Dynamic Gait Index      Dynamic Gait Index   Level Surface  Mild Impairment    Change in Gait Speed  Mild Impairment    Gait with Horizontal Head Turns  Moderate Impairment    Gait with Vertical Head Turns  Mild Impairment    Gait and Pivot Turn  Mild Impairment    Step Over Obstacle  Severe Impairment    Step Around Obstacles  Mild  Impairment    Steps  Moderate Impairment    Total Score  12      Self-Care   Self-Care  Other Self-Care Comments    Other Self-Care Comments   Educatd on use of rollator as stated above      Exercises   Exercises  Knee/Hip;Other Exercises    Other Exercises   sit<>stand from standard arm chair with foam airex to elevate  slightly x 10 reps with BUE support initially however noting that pt unable to get full forward weight shift and trunk lean due to holding onto chair.  Also provided cues for foot placement and pt able to complete task with hands on tasks, added to HEP.        Knee/Hip Exercises: Standing   Hip ADduction  Strengthening;Both;1 set;10 reps    Hip ADduction Limitations  at counter top    Hip Extension  Stengthening;Both;1 set;10 reps    Extension Limitations  at counter top         SIT TO STAND: No Device    Sit with feet shoulder-width apart, on floor. Keep hands on lap for now for support.  Lean chest forward, raise hips up from surface. Straighten hips and knees. Weight bear equally on left and right sides. _10__ reps per set, __2_ sets per day, _5-7__ days per week Place left leg closer to sitting surface.  Copyright  VHI. All rights reserved.   Hip Abduction (Standing)    Stand with support. Squeeze pelvic floor and hold. Lift right leg out to side, keeping toe forward. Hold for __2-3_ seconds. Relax for _2-3__ seconds.  Repeat _10__ times. Do _2__ times a day. Repeat with other leg.    Copyright  VHI. All rights reserved.   Standing Hip Extension    Bring leg back as far as possible. Repeat with other leg.  Stay tall!! Repeat __10__ times. Do __2__ sessions per day.  http://gt2.exer.us/396   Copyright  VHI. All rights reserved.      PT Education - 11/11/17 2033    Education Details  use and benefits of rollator    Person(s) Educated  Patient    Methods  Explanation    Comprehension  Verbalized understanding       PT Short Term  Goals - 11/11/17 2038      PT SHORT TERM GOAL #1   Title  Pt will be independent with initial HEP in order to indicate improved functional mobility.  (Target Date: 12/08/17)    Time  4    Period  Weeks    Status  New      PT SHORT TERM GOAL #2   Title  Will assess DGI and improve score by 3 points from baseline in order to indicate decreased fall risk.      Baseline  12/24 on 11/11/17    Status  New      PT SHORT TERM GOAL #3   Title  Will assess 6MWT and improve distance by 75' from baseline in order to indicate improved functional endurance.     Status  New      PT SHORT TERM GOAL #4   Title  Pt will perform 5TSS in </=20.29 secs with single UE support and without making adjustment to LEs during transition in order to indicate decreased fall risk and improved functional strength.     Status  New      PT SHORT TERM GOAL #5   Title  Pt will ambulate x 300' w/ rollator at mod I level over indoor sufaces in order to indicate safe indoor community mobility.      Status  New      PT SHORT TERM GOAL #6   Title  Pt will ambulate w/ LRAD with gait speed >/=2.62 ft/sec in order to indicate safe community ambulator.     Status  New        PT Long Term  Goals - 11/08/17 1309      PT LONG TERM GOAL #1   Title  Pt will be independent with final HEP and report transition to [employer] program in order to maintain mobility gains made in therapy.  (Target Date: 01/07/18)    Time  8    Period  Weeks    Status  New    Target Date  01/07/18      PT LONG TERM GOAL #2   Title  Pt will improve DGI >19/24 in order to indicate decreased fall risk.      Status  New      PT LONG TERM GOAL #3   Title  Pt will improve 6MWT w/ LRAD by 150' from baseline in order to indicate improved functional endurance.     Status  New      PT LONG TERM GOAL #4   Title  Pt will ambulate up to 500' w/ LRAD over indoor/level and unlevel outdoor paved surfaces at mod I level in order to indicate safe  community mobility.      Status  New      PT LONG TERM GOAL #5   Title  Pt will report return to shopping at farmer's market and cooking at home in order to indicate functional improvement in endurance and standing tolerance.     Status  New            Plan - 11/11/17 2034    Clinical Impression Statement  Skilled session focused on initiating gait training with use of bariatric rollator, formal assessment of balance with DGI (note score of 12/24 indicative of high fall risk), and intiating LE strengthening HEP.  Pt tolerated well but with moderate fatigue due to poor endurance.      Rehab Potential  Good    Clinical Impairments Affecting Rehab Potential  co-morbidities including spinal stenosis and B knee pain    PT Frequency  2x / week    PT Duration  8 weeks    PT Treatment/Interventions  ADLs/Self Care Home Management;DME Instruction;Gait training;Stair training;Functional mobility training;Therapeutic activities;Therapeutic exercise;Balance training;Neuromuscular re-education;Patient/family education;Orthotic Fit/Training;Passive range of motion;Energy conservation;Vestibular    PT Next Visit Plan  See if our bariatric rollator is fixed for 6MWT , did her order come in? we did discuss use of rollator in community to allow increased independence and decrease back pain.  Add to HEP for BLE strengthinging and balance.     Consulted and Agree with Plan of Care  Patient       Patient will benefit from skilled therapeutic intervention in order to improve the following deficits and impairments:  Abnormal gait, Decreased activity tolerance, Decreased balance, Decreased endurance, Decreased knowledge of use of DME, Decreased mobility, Decreased range of motion, Decreased strength, Difficulty walking, Impaired perceived functional ability, Impaired flexibility, Impaired sensation, Postural dysfunction, Obesity  Visit Diagnosis: Muscle weakness (generalized)  Unsteadiness on feet  Other  abnormalities of gait and mobility  Other disturbances of skin sensation     Problem List Patient Active Problem List   Diagnosis Date Noted  . Shingles 10/31/2017  . Estrogen deficiency 10/31/2017  . Diabetic retinopathy ([place]) 10/28/2017  . Mixed stress and urge urinary incontinence 10/03/2017  . Closed right fibular fracture 10/03/2017  . Dependent edema 10/03/2017  . Lumbosacral radiculopathy 09/20/2017  . Diabetic polyneuropathy associated with type 2 diabetes mellitus ([place]) 09/20/2017  . Ulnar neuropathy at elbow of left upper extremity 09/20/2017  . Coronary artery calcification 02/28/2017  .  Neck pain 11/24/2016  . Otalgia of right ear 11/24/2016  . DDD (degenerative disc disease), cervical 11/24/2016  . Arthropathy 11/24/2016  . Left arm numbness 10/31/2016  . Hoarseness 10/31/2016  . Atypical chest pain 03/28/2016  . Tremor of both hands 12/15/2015  . Ataxia 12/15/2015  . Obstructive sleep apnea 11/17/2015  . 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 Periodic limb movements of sleep 11/10/2015  . Obesity hypoventilation syndrome ([place]) 11/10/2015  . Chronic kidney disease (CKD), stage II (mild) 03/05/2013  . COLONIC POLYPS, ADENOMATOUS 05/03/2009  . GLAUCOMA NOS 02/25/2009  . Diabetic neuropathy, type II diabetes mellitus ([place]) 05/28/2008  . LOW BACK PAIN SYNDROME 05/28/2008  . 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 DM (diabetes mellitus), type 2, uncontrolled ([place]) 02/07/2007  . Hyperlipidemia 02/07/2007  . Morbid obesity ([place]) 02/07/2007  . Essential hypertension 02/07/2007  . TB SKIN TEST, POSITIVE 02/07/2007  . TOBACCO USE, QUIT 02/07/2007  . HYSTERECTOMY, HX OF 02/07/2007    Cameron Sprang, PT, MPT [facility] [address] [place], [place], [address] Phone: [phone]   Fax:  [phone] 11/11/17, 8:40 PM  Name: Rebecca Hicks MRN: [id number] Date of Birth:  Jan 02, 1946

## 2017-11-11 NOTE — Patient Instructions (Addendum)
SIT TO STAND: No Device    Sit with feet shoulder-width apart, on floor. Keep hands on lap for now for support.  Lean chest forward, raise hips up from surface. Straighten hips and knees. Weight bear equally on left and right sides. _10__ reps per set, __2_ sets per day, _5-7__ days per week Place left leg closer to sitting surface.  Copyright  VHI. All rights reserved.   Hip Abduction (Standing)    Stand with support. Squeeze pelvic floor and hold. Lift right leg out to side, keeping toe forward. Hold for __2-3_ seconds. Relax for _2-3__ seconds.  Repeat _10__ times. Do _2__ times a day. Repeat with other leg.    Copyright  VHI. All rights reserved.   Standing Hip Extension    Bring leg back as far as possible. Repeat with other leg.  Stay tall!! Repeat __10__ times. Do __2__ sessions per day.  http://gt2.exer.us/396   Copyright  VHI. All rights reserved.

## 2017-11-12 DIAGNOSIS — E113313 Type 2 diabetes mellitus with moderate nonproliferative diabetic retinopathy with macular edema, bilateral: Secondary | ICD-10-CM | POA: Insufficient documentation

## 2017-11-15 ENCOUNTER — Ambulatory Visit: Payer: Medicare Other | Admitting: Physical Therapy

## 2017-11-19 ENCOUNTER — Ambulatory Visit: Payer: Medicare Other | Admitting: Physical Therapy

## 2017-11-19 ENCOUNTER — Encounter: Payer: Self-pay | Admitting: Physical Therapy

## 2017-11-19 ENCOUNTER — Telehealth: Payer: Self-pay | Admitting: Physical Therapy

## 2017-11-19 DIAGNOSIS — M6281 Muscle weakness (generalized): Secondary | ICD-10-CM

## 2017-11-19 DIAGNOSIS — R2681 Unsteadiness on feet: Secondary | ICD-10-CM

## 2017-11-19 DIAGNOSIS — R2689 Other abnormalities of gait and mobility: Secondary | ICD-10-CM

## 2017-11-19 DIAGNOSIS — R208 Other disturbances of skin sensation: Secondary | ICD-10-CM

## 2017-11-19 NOTE — Telephone Encounter (Signed)
Dr. Lynann Bologna,  Rebecca Hicks is being treated by physical therapy for balance and gait deficits. It appears she will benefit from use of rollator walker in order to improve safety with functional mobility.    If you agree, please submit request in EPIC under referral for DME for a rollator walker or fax to [facility] at [phone].   Thank you,  Willow Ora, PTA, [facility] [address] [place], [place] [address] [phone] 11/19/17, 9:25 PM    [facility] [address] [place], [place]  [address] Phone:  [phone] Fax:  [phone]

## 2017-11-19 NOTE — Therapy (Signed)
[facility] [address] [place], [place], [address] Phone: [phone]   Fax:  [phone]  Physical Therapy Treatment  Patient Details  Name: Rebecca Hicks MRN: [id number] Date of Birth: 1945/09/23 Referring Provider: Almedia Balls, MD   Encounter Date: 11/19/2017  PT End of Session - 11/19/17 0937    Visit Number  3    Number of Visits  17    Date for PT Re-Evaluation  76/28/31 cert for 90 days, plan of Care for 60 days    Authorization Type  [employer] Medicare    PT Start Time  0935    PT Stop Time  1015    PT Time Calculation (min)  40 min    Equipment Utilized During Treatment  Gait belt    Activity Tolerance  Patient tolerated treatment well;No increased pain;Patient limited by fatigue    Behavior During Therapy  [facility] for tasks assessed/performed       Past Medical History:  Diagnosis Date  . Abdominal pain 12/05/2011  . Abdominal pain, left upper quadrant 12/03/2012  . Acute on chronic renal failure ([place]) 12/25/2012  . Arthritis   . 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 Ataxia 12/15/2015  . Atypical chest pain 03/28/2016  . Cataract   . CERUMEN IMPACTION, RIGHT 01/31/2010   Qualifier: Diagnosis of  By: Dianah Field MD, Marcello Moores    . Chest tightness 02/23/2014  . Chronic kidney disease (CKD), stage II (mild) 03/05/2013   Looks like baseline creat = 1.2 -1.3.  Watch for overdiuresis.   . COLONIC POLYPS, ADENOMATOUS 05/03/2009   Every five year colonoscopy due to adenomatous polyp found 04/2009    . DDD (degenerative disc disease), cervical 11/24/2016  . Diabetes mellitus   . Diabetic neuropathy, type II diabetes mellitus ([place]) 05/28/2008   Qualifier: Diagnosis of  By: Andria Frames MD, Gwyndolyn Saxon    . Diarrhea 01/21/2015  . DM (diabetes mellitus), type 2, uncontrolled ([place]) 02/07/2007   Qualifier: Diagnosis of  By: Wynetta Emery RN, Doroteo Bradford    . 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 Essential hypertension 02/07/2007   Qualifier: Diagnosis  of  By: Wynetta Emery RN, Doroteo Bradford    . Excessive sleepiness 04/07/2014  . Fatigue 05/04/2011  . Glaucoma   . GLAUCOMA NOS 02/25/2009   Qualifier: Diagnosis of  By: Andria Frames MD, Gwyndolyn Saxon    . Great toe pain 09/18/2012  . History of nuclear stress test    Myoview 11/17: EF 64, Normal pharmacologic nuclear stress test with no evidence of prior infarct or ischemia.  Marland Kitchen Hoarseness 10/31/2016  . Hyperlipidemia   . Hypertension   . LOW BACK PAIN SYNDROME 05/28/2008   Qualifier: Diagnosis of  By: Andria Frames MD, Gwyndolyn Saxon    . Morbid obesity ([place])   . Neck pain 11/24/2016  . Neuromuscular disorder ([place])   . Obesity hypoventilation syndrome ([place]) 11/10/2015  . Obstructive sleep apnea 11/17/2015  . Otalgia of right ear 11/24/2016  . Polyneuropathy in diabetes(357.2)   . Sarcoidosis   . Sinusitis acute 10/03/2010  . 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 - She was fitted with a Small size [employer] Full Face Mask Simplus mask and heated humidification.   . Snoring 09/05/2015  . TB SKIN TEST, POSITIVE 02/07/2007   Annotation: with clear CXR mid 1990's Qualifier: History of  By: Milana Obey, Doroteo Bradford    . TOBACCO USE, QUIT 02/07/2007  Qualifier: Diagnosis of  By: Andria Frames MD, William    . Tremor of both hands 12/15/2015  . U R I 03/15/2010   Qualifier: Diagnosis of  By: Andria Frames MD, Gwyndolyn Saxon      Past Surgical History:  Procedure Laterality Date  . ABDOMINAL HYSTERECTOMY    . APPENDECTOMY    . CATARACT EXTRACTION, BILATERAL  03/2010, 04/2010  . COLONOSCOPY  2011  . EYE SURGERY    . POLYPECTOMY  2011   polyps, hems   . SPINE SURGERY     lumbar laminectomy X 2  . TONSILLECTOMY      There were no vitals filed for this visit.  Subjective Assessment - 11/19/17 0936    Subjective  Having some bil knee/thigh pain today. Reports some stumbles, no falls.     Pertinent History  DM, HTN, falls,  spinal stenosis of lumbar region, fibular fracture 6/3-mostly healed, has been getting injections in back    Limitations  Walking;Standing    Patient Stated Goals  "I want to be able to get some exercise to get this weight off so my back feels better."     Currently in Pain?  No/denies    Pain Score  0-No pain         [facility] PT Assessment - 11/19/17 0945      6 Minute Walk- Baseline   6 Minute Walk- Baseline  yes    BP (mmHg)  131/91  (Abnormal)     HR (bpm)  70    02 Sat (%RA)  95 %    Modified Borg Scale for Dyspnea  0- Nothing at all    Perceived Rate of Exertion (Borg)  6-      6 Minute walk- Post Test   6 Minute Walk Post Test  yes    BP (mmHg)  164/91  (Abnormal)     HR (bpm)  83    02 Sat (%RA)  95 %    Modified Borg Scale for Dyspnea  3- Moderate shortness of breath or breathing difficulty    Perceived Rate of Exertion (Borg)  14-      6 minute walk test results    Aerobic Endurance Distance Walked  679    Endurance additional comments  using standard rollator. one standing rest break for ~15 seconds after 2 minutes, one seated rest break for ~30 sec's after 4 minutes.          [facility] Adult PT Treatment/Exercise - 11/19/17 2051      Ambulation/Gait   Ambulation/Gait  Yes    Ambulation/Gait Assistance  5: Supervision    Ambulation/Gait Assistance Details  used standard rollator today as pt's self reported weight falls below the max weight limit of 300 pounds. cues on hand placement on rollator for safety, rollator proximity with gait, posture and for increased step length     Ambulation Distance (Feet)  115 Feet x1, plus 6 minute walk distance    Assistive device  Rollator    Gait Pattern  Step-through pattern;Decreased stride length;Trunk flexed;Narrow base of support    Ambulation Surface  Level;Indoor      Exercises   Exercises  Other Exercises    Other Exercises   pt performed 5-10 reps of each exercise issued at previous session. min cues needed on posture and  correct ex form/technique. rest breaks needed between each exercise.              PT Education - 11/19/17 1014  Education Details  use of rollator/BSC, results of 6 minute walk test, reviewed current HEP with pt performing with cues on ex form/technique    Person(s) Educated  Patient    Methods  Explanation;Verbal cues    Comprehension  Verbalized understanding;Returned demonstration       PT Short Term Goals - 11/19/17 2120      PT SHORT TERM GOAL #1   Title  Pt will be independent with initial HEP in order to indicate improved functional mobility.  (Target Date: 12/08/17)    Time  4    Period  Weeks    Status  New      PT SHORT TERM GOAL #2   Title  Will assess DGI and improve score by 3 points from baseline in order to indicate decreased fall risk.      Baseline  12/24 on 11/11/17    Status  New      PT SHORT TERM GOAL #3   Title  Will assess 6MWT and improve distance by 75' from baseline in order to indicate improved functional endurance.     Baseline  11/19/17: baseline 679 feet with rollator    Status  New      PT SHORT TERM GOAL #4   Title  Pt will perform 5TSS in </=20.29 secs with single UE support and without making adjustment to LEs during transition in order to indicate decreased fall risk and improved functional strength.     Status  New      PT SHORT TERM GOAL #5   Title  Pt will ambulate x 300' w/ rollator at mod I level over indoor sufaces in order to indicate safe indoor community mobility.      Status  New      PT SHORT TERM GOAL #6   Title  Pt will ambulate w/ LRAD with gait speed >/=2.62 ft/sec in order to indicate safe community ambulator.     Status  New        PT Long Term Goals - 11/19/17 2122      PT LONG TERM GOAL #1   Title  Pt will be independent with final HEP and report transition to [employer] program in order to maintain mobility gains made in therapy.  (Target Date: 01/07/18)    Time  8    Period  Weeks    Status  New        PT LONG TERM GOAL #2   Title  Pt will improve DGI >19/24 in order to indicate decreased fall risk.      Status  New      PT LONG TERM GOAL #3   Title  Pt will improve 6MWT w/ LRAD by 150' from baseline in order to indicate improved functional endurance.     Baseline  11/19/17: 679 feet with rollator as baseline today    Status  New      PT LONG TERM GOAL #4   Title  Pt will ambulate up to 500' w/ LRAD over indoor/level and unlevel outdoor paved surfaces at mod I level in order to indicate safe community mobility.      Status  New      PT LONG TERM GOAL #5   Title  Pt will report return to shopping at farmer's market and cooking at home in order to indicate functional improvement in endurance and standing tolerance.     Status  New  Plan - 11/19/17 0354    Clinical Impression Statement  Today's skilled session obtained baseline value of the 6 minute walk test using a rollator walker. Session also focused on education on use and benefits of the rollator walker. Pt is interested in using one. Will submit a request to referring MD for an order for a rollator walker. Remainder of session address HEP issued last session with cues needed on correct form/technique. Pt is progressing toward goals and should benefit from continued PT to progress toward unmet goals.     Rehab Potential  Good    Clinical Impairments Affecting Rehab Potential  co-morbidities including spinal stenosis and B knee pain    PT Frequency  2x / week    PT Duration  8 weeks    PT Treatment/Interventions  ADLs/Self Care Home Management;DME Instruction;Gait training;Stair training;Functional mobility training;Therapeutic activities;Therapeutic exercise;Balance training;Neuromuscular re-education;Patient/family education;Orthotic Fit/Training;Passive range of motion;Energy conservation;Vestibular    PT Next Visit Plan  continue to work on gait/barriers with rollator walker; see if order came in; continue to work on  Mart and balance.     Consulted and Agree with Plan of Care  Patient       Patient will benefit from skilled therapeutic intervention in order to improve the following deficits and impairments:  Abnormal gait, Decreased activity tolerance, Decreased balance, Decreased endurance, Decreased knowledge of use of DME, Decreased mobility, Decreased range of motion, Decreased strength, Difficulty walking, Impaired perceived functional ability, Impaired flexibility, Impaired sensation, Postural dysfunction, Obesity  Visit Diagnosis: Muscle weakness (generalized)  Unsteadiness on feet  Other abnormalities of gait and mobility  Other disturbances of skin sensation     Problem List Patient Active Problem List   Diagnosis Date Noted  . Shingles 10/31/2017  . Estrogen deficiency 10/31/2017  . Diabetic retinopathy ([place]) 10/28/2017  . Mixed stress and urge urinary incontinence 10/03/2017  . Closed right fibular fracture 10/03/2017  . Dependent edema 10/03/2017  . Lumbosacral radiculopathy 09/20/2017  . Diabetic polyneuropathy associated with type 2 diabetes mellitus ([place]) 09/20/2017  . Ulnar neuropathy at elbow of left upper extremity 09/20/2017  . Coronary artery calcification 02/28/2017  . Neck pain 11/24/2016  . Otalgia of right ear 11/24/2016  . DDD (degenerative disc disease), cervical 11/24/2016  . Arthropathy 11/24/2016  . Left arm numbness 10/31/2016  . Hoarseness 10/31/2016  . Atypical chest pain 03/28/2016  . Tremor of both hands 12/15/2015  . Ataxia 12/15/2015  . Obstructive sleep apnea 11/17/2015  . 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 Periodic limb movements of sleep 11/10/2015  . Obesity hypoventilation syndrome ([place]) 11/10/2015  . Chronic kidney disease (CKD), stage II (mild) 03/05/2013  . COLONIC POLYPS, ADENOMATOUS 05/03/2009  . GLAUCOMA NOS 02/25/2009  . Diabetic neuropathy, type II diabetes mellitus  ([place]) 05/28/2008  . LOW BACK PAIN SYNDROME 05/28/2008  . 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 DM (diabetes mellitus), type 2, uncontrolled ([place]) 02/07/2007  . Hyperlipidemia 02/07/2007  . Morbid obesity ([place]) 02/07/2007  . Essential hypertension 02/07/2007  . TB SKIN TEST, POSITIVE 02/07/2007  . TOBACCO USE, QUIT 02/07/2007  . HYSTERECTOMY, HX OF 02/07/2007    Willow Ora, PTA, [facility] [address] [place], [place] [address] [phone] 11/19/17, 9:22 PM   Name: Rebecca Hicks MRN: [id number] Date of Birth: [birth year]

## 2017-11-21 ENCOUNTER — Encounter: Payer: Self-pay | Admitting: Family Medicine

## 2017-11-22 ENCOUNTER — Encounter: Payer: Self-pay | Admitting: Physical Therapy

## 2017-11-22 ENCOUNTER — Ambulatory Visit: Payer: Medicare Other | Attending: Orthopedic Surgery | Admitting: Physical Therapy

## 2017-11-22 DIAGNOSIS — R208 Other disturbances of skin sensation: Secondary | ICD-10-CM | POA: Insufficient documentation

## 2017-11-22 DIAGNOSIS — M6281 Muscle weakness (generalized): Secondary | ICD-10-CM | POA: Diagnosis not present

## 2017-11-22 DIAGNOSIS — M545 Low back pain: Secondary | ICD-10-CM | POA: Diagnosis present

## 2017-11-22 DIAGNOSIS — R2689 Other abnormalities of gait and mobility: Secondary | ICD-10-CM | POA: Insufficient documentation

## 2017-11-22 DIAGNOSIS — R2681 Unsteadiness on feet: Secondary | ICD-10-CM | POA: Diagnosis present

## 2017-11-22 DIAGNOSIS — G8929 Other chronic pain: Secondary | ICD-10-CM | POA: Diagnosis present

## 2017-11-22 NOTE — Therapy (Signed)
[facility] [address] [place], [place], [address] Phone: [phone]   Fax:  [phone]  Physical Therapy Treatment  Patient Details  Name: Rebecca Hicks MRN: [id number] Date of Birth: 03-Dec-1945 Referring Provider: Almedia Balls, MD   Encounter Date: 11/22/2017  PT End of Session - 11/22/17 0810    Visit Number  4    Number of Visits  17    Date for PT Re-Evaluation  63/89/37 cert for 90 days, plan of Care for 60 days    Authorization Type  [employer] Medicare    PT Start Time  [phone] pt running late today    PT Stop Time  0845    PT Time Calculation (min)  38 min    Equipment Utilized During Treatment  Gait belt    Activity Tolerance  Patient tolerated treatment well    Behavior During Therapy  [facility] for tasks assessed/performed       Past Medical History:  Diagnosis Date  . Abdominal pain 12/05/2011  . Abdominal pain, left upper quadrant 12/03/2012  . Acute on chronic renal failure ([place]) 12/25/2012  . Arthritis   . 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 Ataxia 12/15/2015  . Atypical chest pain 03/28/2016  . Cataract   . CERUMEN IMPACTION, RIGHT 01/31/2010   Qualifier: Diagnosis of  By: Dianah Field MD, Marcello Moores    . Chest tightness 02/23/2014  . Chronic kidney disease (CKD), stage II (mild) 03/05/2013   Looks like baseline creat = 1.2 -1.3.  Watch for overdiuresis.   . COLONIC POLYPS, ADENOMATOUS 05/03/2009   Every five year colonoscopy due to adenomatous polyp found 04/2009    . DDD (degenerative disc disease), cervical 11/24/2016  . Diabetes mellitus   . Diabetic neuropathy, type II diabetes mellitus ([place]) 05/28/2008   Qualifier: Diagnosis of  By: Andria Frames MD, Gwyndolyn Saxon    . Diarrhea 01/21/2015  . DM (diabetes mellitus), type 2, uncontrolled ([place]) 02/07/2007   Qualifier: Diagnosis of  By: Wynetta Emery RN, Doroteo Bradford    . 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 Essential hypertension 02/07/2007   Qualifier: Diagnosis of  By: Wynetta Emery RN, Doroteo Bradford     . Excessive sleepiness 04/07/2014  . Fatigue 05/04/2011  . Glaucoma   . GLAUCOMA NOS 02/25/2009   Qualifier: Diagnosis of  By: Andria Frames MD, Gwyndolyn Saxon    . Great toe pain 09/18/2012  . History of nuclear stress test    Myoview 11/17: EF 64, Normal pharmacologic nuclear stress test with no evidence of prior infarct or ischemia.  Marland Kitchen Hoarseness 10/31/2016  . Hyperlipidemia   . Hypertension   . LOW BACK PAIN SYNDROME 05/28/2008   Qualifier: Diagnosis of  By: Andria Frames MD, Gwyndolyn Saxon    . Morbid obesity ([place])   . Neck pain 11/24/2016  . Neuromuscular disorder ([place])   . Obesity hypoventilation syndrome ([place]) 11/10/2015  . Obstructive sleep apnea 11/17/2015  . Otalgia of right ear 11/24/2016  . Polyneuropathy in diabetes(357.2)   . Sarcoidosis   . Sinusitis acute 10/03/2010  . 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 - She was fitted with a Small size [employer] Full Face Mask Simplus mask and heated humidification.   . Snoring 09/05/2015  . TB SKIN TEST, POSITIVE 02/07/2007   Annotation: with clear CXR mid 1990's Qualifier: History of  By: Milana Obey, Doroteo Bradford    . TOBACCO USE, QUIT 02/07/2007  Qualifier: Diagnosis of  By: Andria Frames MD, William    . Tremor of both hands 12/15/2015  . U R I 03/15/2010   Qualifier: Diagnosis of  By: Andria Frames MD, Gwyndolyn Saxon      Past Surgical History:  Procedure Laterality Date  . ABDOMINAL HYSTERECTOMY    . APPENDECTOMY    . CATARACT EXTRACTION, BILATERAL  03/2010, 04/2010  . COLONOSCOPY  2011  . EYE SURGERY    . POLYPECTOMY  2011   polyps, hems   . SPINE SURGERY     lumbar laminectomy X 2  . TONSILLECTOMY      There were no vitals filed for this visit.  Subjective Assessment - 11/22/17 0808    Subjective  No new complaints. No falls to report. Continues with same bil knee/leg pain.    Pertinent History  DM, HTN, falls, spinal stenosis of  lumbar region, fibular fracture 6/3-mostly healed, has been getting injections in back    Limitations  Walking;Standing    Patient Stated Goals  "I want to be able to get some exercise to get this weight off so my back feels better."     Currently in Pain?  No/denies    Pain Score  0-No pain    Pain Location  Back    Pain Score  5    Pain Location  Generalized bil knees and below    Pain Orientation  Right;Left    Pain Descriptors / Indicators  Aching    Pain Type  Chronic pain    Pain Onset  More than a month ago    Pain Frequency  Intermittent    Aggravating Factors   walking, weight bearing    Pain Relieving Factors  rest, ice/heat, tiger balm           [facility] Adult PT Treatment/Exercise - 11/22/17 0811      Transfers   Transfers  Sit to Stand;Stand to Sit    Sit to Stand  6: Modified independent (Device/Increase time);4: Min assist    Sit to Stand Details (indicate cue type and reason)  mod I from higher surfaces. min assist needed from lower mat table/standard height chair surfaces with rocking to gain momentum to stand     Stand to Sit  6: Modified independent (Device/Increase time);4: Min assist    Stand to Sit Details  Mod I to elevated surfaces. min assist needed to slow descent with lower mats/standard height chair surfaces      Ambulation/Gait   Ambulation/Gait  Yes    Ambulation/Gait Assistance  5: Supervision    Ambulation/Gait Assistance Details  cues on hand placement on brakes, rollator position with gait and posture. pt with bil foot slap noted with gait as well.     Ambulation Distance (Feet)  460 Feet x1, plus around gym with other activities    Assistive device  Rollator    Gait Pattern  Step-through pattern;Decreased stride length;Trunk flexed;Narrow base of support;Right foot flat;Left foot flat    Ambulation Surface  Level;Indoor      High Level Balance   High Level Balance Activities  Side stepping;Tandem walking;Marching forwards    High Level Balance  Comments  at counter top with UE support: side stepping x 3 laps each way with bil UE support on counter with cues on form/posture; fwd marching x 4 laps with single UE support on counter with cues on posture/form; tandem gait x 4 laps fwd with single UE support on counter for all 4 laps and  HHA on other side for 1st 2 laps, cues on posture/form.       Knee/Hip Exercises: Aerobic   Other Aerobic  Scifit UE/LE' at level 1.0 for 5 minutes with goal >/=45 rpm for strengthening and activity tolerance       Knee/Hip Exercises: Standing   Heel Raises  Both;1 set;10 reps;5 seconds;Limitations    Heel Raises Limitations  heel<>toe raises with 5 sec holds x 10 reps each with UE support on counter top. limited right toe lift, no left toe lift noted.     Hip Abduction  AROM;Stengthening;Both;1 set;10 reps;Knee straight;Limitations    Abduction Limitations  with countertop support for balance, cues on form/technique- keep toes pointed forward and leg out to side as pt tended to exhibit hip flexion with side lift as well.     Hip Extension  AROM;Stengthening;Both;1 set;10 reps;Knee straight;Limitations    Extension Limitations  with countertop support: cues for posture and form- to stay upright and keep knees as straight as possible.                PT Short Term Goals - 11/19/17 2120      PT SHORT TERM GOAL #1   Title  Pt will be independent with initial HEP in order to indicate improved functional mobility.  (Target Date: 12/08/17)    Time  4    Period  Weeks    Status  New      PT SHORT TERM GOAL #2   Title  Will assess DGI and improve score by 3 points from baseline in order to indicate decreased fall risk.      Baseline  12/24 on 11/11/17    Status  New      PT SHORT TERM GOAL #3   Title  Will assess 6MWT and improve distance by 75' from baseline in order to indicate improved functional endurance.     Baseline  11/19/17: baseline 679 feet with rollator    Status  New      PT SHORT TERM  GOAL #4   Title  Pt will perform 5TSS in </=20.29 secs with single UE support and without making adjustment to LEs during transition in order to indicate decreased fall risk and improved functional strength.     Status  New      PT SHORT TERM GOAL #5   Title  Pt will ambulate x 300' w/ rollator at mod I level over indoor sufaces in order to indicate safe indoor community mobility.      Status  New      PT SHORT TERM GOAL #6   Title  Pt will ambulate w/ LRAD with gait speed >/=2.62 ft/sec in order to indicate safe community ambulator.     Status  New        PT Long Term Goals - 11/19/17 2122      PT LONG TERM GOAL #1   Title  Pt will be independent with final HEP and report transition to [employer] program in order to maintain mobility gains made in therapy.  (Target Date: 01/07/18)    Time  8    Period  Weeks    Status  New      PT LONG TERM GOAL #2   Title  Pt will improve DGI >19/24 in order to indicate decreased fall risk.      Status  New      PT LONG TERM GOAL #3   Title  Pt will  improve 6MWT w/ LRAD by 150' from baseline in order to indicate improved functional endurance.     Baseline  11/19/17: 679 feet with rollator as baseline today    Status  New      PT LONG TERM GOAL #4   Title  Pt will ambulate up to 500' w/ LRAD over indoor/level and unlevel outdoor paved surfaces at mod I level in order to indicate safe community mobility.      Status  New      PT LONG TERM GOAL #5   Title  Pt will report return to shopping at farmer's market and cooking at home in order to indicate functional improvement in endurance and standing tolerance.     Status  New            Plan - 11/22/17 3500    Clinical Impression Statement  Today's skilled session continued to address safe use of rollator indoors only due to rainy weather outside. Will plan for outside with rollator at next session. Session also continued to address strengthening and balance with pt needing rest  breaks due to fatigue. No increase in pain reported with session. Pt is progressing well toward goals and should benefit from continued PT to progress toward unmet goals.     Rehab Potential  Good    Clinical Impairments Affecting Rehab Potential  co-morbidities including spinal stenosis and B knee pain    PT Frequency  2x / week    PT Duration  8 weeks    PT Treatment/Interventions  ADLs/Self Care Home Management;DME Instruction;Gait training;Stair training;Functional mobility training;Therapeutic activities;Therapeutic exercise;Balance training;Neuromuscular re-education;Patient/family education;Orthotic Fit/Training;Passive range of motion;Energy conservation;Vestibular    PT Next Visit Plan  continue to work on gait/barriers outside weather permitting with rollator walker; see if order came in; continue to work on [place] and balance.     Consulted and Agree with Plan of Care  Patient       Patient will benefit from skilled therapeutic intervention in order to improve the following deficits and impairments:  Abnormal gait, Decreased activity tolerance, Decreased balance, Decreased endurance, Decreased knowledge of use of DME, Decreased mobility, Decreased range of motion, Decreased strength, Difficulty walking, Impaired perceived functional ability, Impaired flexibility, Impaired sensation, Postural dysfunction, Obesity  Visit Diagnosis: Muscle weakness (generalized)  Unsteadiness on feet  Other abnormalities of gait and mobility  Other disturbances of skin sensation  Chronic midline low back pain, with sciatica presence unspecified     Problem List Patient Active Problem List   Diagnosis Date Noted  . Shingles 10/31/2017  . Estrogen deficiency 10/31/2017  . Diabetic retinopathy ([place]) 10/28/2017  . Mixed stress and urge urinary incontinence 10/03/2017  . Closed right fibular fracture 10/03/2017  . Dependent edema 10/03/2017  . Lumbosacral radiculopathy 09/20/2017   . Diabetic polyneuropathy associated with type 2 diabetes mellitus ([place]) 09/20/2017  . Ulnar neuropathy at elbow of left upper extremity 09/20/2017  . Coronary artery calcification 02/28/2017  . Neck pain 11/24/2016  . Otalgia of right ear 11/24/2016  . DDD (degenerative disc disease), cervical 11/24/2016  . Arthropathy 11/24/2016  . Left arm numbness 10/31/2016  . Hoarseness 10/31/2016  . Atypical chest pain 03/28/2016  . Tremor of both hands 12/15/2015  . Ataxia 12/15/2015  . Obstructive sleep apnea 11/17/2015  . 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 Periodic limb movements of sleep 11/10/2015  . Obesity hypoventilation syndrome ([place]) 11/10/2015  . Chronic kidney disease (CKD), stage II (mild) 03/05/2013  .  COLONIC POLYPS, ADENOMATOUS 05/03/2009  . GLAUCOMA NOS 02/25/2009  . Diabetic neuropathy, type II diabetes mellitus ([place]) 05/28/2008  . LOW BACK PAIN SYNDROME 05/28/2008  . 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 DM (diabetes mellitus), type 2, uncontrolled ([place]) 02/07/2007  . Hyperlipidemia 02/07/2007  . Morbid obesity ([place]) 02/07/2007  . Essential hypertension 02/07/2007  . TB SKIN TEST, POSITIVE 02/07/2007  . TOBACCO USE, QUIT 02/07/2007  . HYSTERECTOMY, HX OF 02/07/2007    Willow Ora 11/22/2017, 12:13 PM  [facility] [address] [place], [place], [address] Phone: [phone]   Fax:  [phone]  Name: GLENDALE WHERRY MRN: [id number] Date of Birth: 1946-01-22

## 2017-11-26 ENCOUNTER — Ambulatory Visit: Payer: Medicare Other | Admitting: Physical Therapy

## 2017-11-29 ENCOUNTER — Ambulatory Visit: Payer: Medicare Other | Admitting: Physical Therapy

## 2017-12-03 ENCOUNTER — Encounter: Payer: Self-pay | Admitting: Rehabilitation

## 2017-12-03 ENCOUNTER — Ambulatory Visit: Payer: Medicare Other | Admitting: Rehabilitation

## 2017-12-03 DIAGNOSIS — R2689 Other abnormalities of gait and mobility: Secondary | ICD-10-CM

## 2017-12-03 DIAGNOSIS — M6281 Muscle weakness (generalized): Secondary | ICD-10-CM | POA: Diagnosis not present

## 2017-12-03 DIAGNOSIS — R208 Other disturbances of skin sensation: Secondary | ICD-10-CM

## 2017-12-03 DIAGNOSIS — R2681 Unsteadiness on feet: Secondary | ICD-10-CM

## 2017-12-03 NOTE — Patient Instructions (Addendum)
Stand in a corner with a chair in front of you for support.   Feet Together, Head Motion - Eyes Open    With eyes open, feet together, move head slowly up and down x 10 reps, side to side x 10 reps.  Repeat __1__ times per session. Do __1-2__ sessions per day.  Copyright  VHI. All rights reserved.    Feet Together, Varied Arm Positions - Eyes Closed    Stand with feet together and arms by your side. Close eyes and visualize upright position. Hold __20__ seconds. Repeat _3___ times per session. Do __1-2__ sessions per day.  Copyright  VHI. All rights reserved.    Feet Apart (Compliant Surface) Arm Motion - Eyes Closed    Stand on compliant surface: ___pillow _____, feet shoulder width apart. Close eyes and keep arms by your side.  Repeat __3__ times per session for 20 secs each. Do __1-2__ sessions per day.  Copyright  VHI. All rights reserved.

## 2017-12-03 NOTE — Therapy (Signed)
[facility] [address] [place], [place], [address] Phone: [phone]   Fax:  [phone]  Physical Therapy Treatment  Patient Details  Name: Rebecca Hicks MRN: [id number] Date of Birth: October 13, 1945 Referring Provider: Almedia Balls, MD   Encounter Date: 12/03/2017  PT End of Session - 12/03/17 1411    Visit Number  5    Number of Visits  17    Date for PT Re-Evaluation  37/34/28   cert for 90 days, plan of Care for 60 days   Authorization Type  [employer] Medicare    PT Start Time  1403    PT Stop Time  1445    PT Time Calculation (min)  42 min    Equipment Utilized During Treatment  Gait belt    Activity Tolerance  Patient tolerated treatment well    Behavior During Therapy  [facility] for tasks assessed/performed       Past Medical History:  Diagnosis Date  . Abdominal pain 12/05/2011  . Abdominal pain, left upper quadrant 12/03/2012  . Acute on chronic renal failure ([place]) 12/25/2012  . Arthritis   . 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 Ataxia 12/15/2015  . Atypical chest pain 03/28/2016  . Cataract   . CERUMEN IMPACTION, RIGHT 01/31/2010   Qualifier: Diagnosis of  By: Dianah Field MD, Marcello Moores    . Chest tightness 02/23/2014  . Chronic kidney disease (CKD), stage II (mild) 03/05/2013   Looks like baseline creat = 1.2 -1.3.  Watch for overdiuresis.   . COLONIC POLYPS, ADENOMATOUS 05/03/2009   Every five year colonoscopy due to adenomatous polyp found 04/2009    . DDD (degenerative disc disease), cervical 11/24/2016  . Diabetes mellitus   . Diabetic neuropathy, type II diabetes mellitus ([place]) 05/28/2008   Qualifier: Diagnosis of  By: Andria Frames MD, Gwyndolyn Saxon    . Diarrhea 01/21/2015  . DM (diabetes mellitus), type 2, uncontrolled ([place]) 02/07/2007   Qualifier: Diagnosis of  By: Wynetta Emery RN, Doroteo Bradford    . 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 Essential hypertension 02/07/2007   Qualifier: Diagnosis of  By: Wynetta Emery RN, Doroteo Bradford    . Excessive  sleepiness 04/07/2014  . Fatigue 05/04/2011  . Glaucoma   . GLAUCOMA NOS 02/25/2009   Qualifier: Diagnosis of  By: Andria Frames MD, Gwyndolyn Saxon    . Great toe pain 09/18/2012  . History of nuclear stress test    Myoview 11/17: EF 64, Normal pharmacologic nuclear stress test with no evidence of prior infarct or ischemia.  Marland Kitchen Hoarseness 10/31/2016  . Hyperlipidemia   . Hypertension   . LOW BACK PAIN SYNDROME 05/28/2008   Qualifier: Diagnosis of  By: Andria Frames MD, Gwyndolyn Saxon    . Morbid obesity ([place])   . Neck pain 11/24/2016  . Neuromuscular disorder ([place])   . Obesity hypoventilation syndrome ([place]) 11/10/2015  . Obstructive sleep apnea 11/17/2015  . Otalgia of right ear 11/24/2016  . Polyneuropathy in diabetes(357.2)   . Sarcoidosis   . Sinusitis acute 10/03/2010  . 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 - She was fitted with a Small size [employer] Full Face Mask Simplus mask and heated humidification.   . Snoring 09/05/2015  . TB SKIN TEST, POSITIVE 02/07/2007   Annotation: with clear CXR mid 1990's Qualifier: History of  By: Milana Obey, Doroteo Bradford    . TOBACCO USE, QUIT 02/07/2007   Qualifier: Diagnosis  of  By: Andria Frames MD, Gwyndolyn Saxon    . Tremor of both hands 12/15/2015  . U R I 03/15/2010   Qualifier: Diagnosis of  By: Andria Frames MD, Gwyndolyn Saxon      Past Surgical History:  Procedure Laterality Date  . ABDOMINAL HYSTERECTOMY    . APPENDECTOMY    . CATARACT EXTRACTION, BILATERAL  03/2010, 04/2010  . COLONOSCOPY  2011  . EYE SURGERY    . POLYPECTOMY  2011   polyps, hems   . SPINE SURGERY     lumbar laminectomy X 2  . TONSILLECTOMY      There were no vitals filed for this visit.  Subjective Assessment - 12/03/17 1408    Subjective  Pt reports increased soreness and fatigue this week.  Has been visiting brother at [employer].  Uses cane intermittently when walking longer  distances.     Pertinent History  DM, HTN, falls, spinal stenosis of lumbar region, fibular fracture 6/3-mostly healed, has been getting injections in back    Limitations  Walking;Standing    Patient Stated Goals  "I want to be able to get some exercise to get this weight off so my back feels better."     Currently in Pain?  Yes    Pain Score  6     Pain Location  Knee    Pain Orientation  Right;Left    Pain Descriptors / Indicators  Aching    Pain Type  Chronic pain    Pain Onset  More than a month ago    Pain Frequency  Constant    Aggravating Factors   increased walking this past week    Pain Relieving Factors  rest                        [facility] Adult PT Treatment/Exercise - 12/03/17 1558      Transfers   Transfers  Sit to Stand;Stand to Sit    Sit to Stand  6: Modified independent (Device/Increase time);4: Min assist    Comments  Pt able to do at mod I level, however needs continued cues for forward weight shift.  Note diffiiculty today due to increased knee pain.       Ambulation/Gait   Ambulation/Gait  Yes    Ambulation/Gait Assistance  6: Modified independent (Device/Increase time)    Ambulation/Gait Assistance Details  Did not have pt go outdoors today due to high heat and heat index, however did continue gait indoors with rollator for improved quality and endurance.  Pt able to complete 5 laps without seated break with SaO2 at 95% and HR to 95.  Pt did very well recalling safe use of brakes during session.  Note that PT spent last part of session calling to MD office to have order faxed over for rollator.  This was done and given to pt within session so that she could take to [place] to retrieve rollator.      Ambulation Distance (Feet)  575 Feet    Assistive device  Rollator    Gait Pattern  Step-through pattern;Decreased stride length;Trunk flexed;Narrow base of support;Right foot flat;Left foot flat    Ambulation Surface  Level;Indoor    Ramp  5:  Supervision    Ramp Details (indicate cue type and reason)  x 2 reps with cues for pumping brakes during descent    Curb  5: Supervision    Curb Details (indicate cue type and reason)  x 2 reps  with cues for negotiation with rollator/brakes      Neuro Re-ed    Neuro Re-ed Details   Added corner balance exercises to HEP today as current HEP is geared more towards strength.  See pt instruction for details.  Performed as stated in instruction.          Feet Together, Head Motion - Eyes Open    With eyes open, feet together, move head slowly up and down x 10 reps, side to side x 10 reps.  Repeat __1__ times per session. Do __1-2__ sessions per day.  Copyright  VHI. All rights reserved.    Feet Together, Varied Arm Positions - Eyes Closed    Stand with feet together and arms by your side. Close eyes and visualize upright position. Hold __20__ seconds. Repeat _3___ times per session. Do __1-2__ sessions per day.  Copyright  VHI. All rights reserved.    Feet Apart (Compliant Surface) Arm Motion - Eyes Closed    Stand on compliant surface: ___pillow _____, feet shoulder width apart. Close eyes and keep arms by your side.  Repeat __3__ times per session for 20 secs each. Do __1-2__ sessions per day.  Copyright  VHI. All rights reserved.      PT Education - 12/03/17 1602    Education Details  balance HEP, getting order for rollator    Person(s) Educated  Patient    Methods  Explanation;Demonstration;Handout    Comprehension  Verbalized understanding;Returned demonstration       PT Short Term Goals - 11/19/17 2120      PT SHORT TERM GOAL #1   Title  Pt will be independent with initial HEP in order to indicate improved functional mobility.  (Target Date: 12/08/17)    Time  4    Period  Weeks    Status  New      PT SHORT TERM GOAL #2   Title  Will assess DGI and improve score by 3 points from baseline in order to indicate decreased fall risk.      Baseline  12/24 on  11/11/17    Status  New      PT SHORT TERM GOAL #3   Title  Will assess 6MWT and improve distance by 75' from baseline in order to indicate improved functional endurance.     Baseline  11/19/17: baseline 679 feet with rollator    Status  New      PT SHORT TERM GOAL #4   Title  Pt will perform 5TSS in </=20.29 secs with single UE support and without making adjustment to LEs during transition in order to indicate decreased fall risk and improved functional strength.     Status  New      PT SHORT TERM GOAL #5   Title  Pt will ambulate x 300' w/ rollator at mod I level over indoor sufaces in order to indicate safe indoor community mobility.      Status  New      PT SHORT TERM GOAL #6   Title  Pt will ambulate w/ LRAD with gait speed >/=2.62 ft/sec in order to indicate safe community ambulator.     Status  New        PT Long Term Goals - 11/19/17 2122      PT LONG TERM GOAL #1   Title  Pt will be independent with final HEP and report transition to [employer] program in order to maintain mobility gains made in therapy.  (Target  Date: 01/07/18)    Time  8    Period  Weeks    Status  New      PT LONG TERM GOAL #2   Title  Pt will improve DGI >19/24 in order to indicate decreased fall risk.      Status  New      PT LONG TERM GOAL #3   Title  Pt will improve 6MWT w/ LRAD by 150' from baseline in order to indicate improved functional endurance.     Baseline  11/19/17: 679 feet with rollator as baseline today    Status  New      PT LONG TERM GOAL #4   Title  Pt will ambulate up to 500' w/ LRAD over indoor/level and unlevel outdoor paved surfaces at mod I level in order to indicate safe community mobility.      Status  New      PT LONG TERM GOAL #5   Title  Pt will report return to shopping at farmer's market and cooking at home in order to indicate functional improvement in endurance and standing tolerance.     Status  New            Plan - 12/03/17 1602    Clinical  Impression Statement  Skilled session focused on adding balance exercises to HEP, gait with rollator indoors to progress endurance, and getting rollator order from Dr. Lynann Bologna.  Order received and provided to pt during session.      Rehab Potential  Good    Clinical Impairments Affecting Rehab Potential  co-morbidities including spinal stenosis and B knee pain    PT Frequency  2x / week    PT Duration  8 weeks    PT Treatment/Interventions  ADLs/Self Care Home Management;DME Instruction;Gait training;Stair training;Functional mobility training;Therapeutic activities;Therapeutic exercise;Balance training;Neuromuscular re-education;Patient/family education;Orthotic Fit/Training;Passive range of motion;Energy conservation;Vestibular    PT Next Visit Plan  Did she get rollator? check HEP, STGs, continue to work on gait/barriers outside weather permitting with rollator walker; see if order came in; continue to work on [place] and balance.     Consulted and Agree with Plan of Care  Patient       Patient will benefit from skilled therapeutic intervention in order to improve the following deficits and impairments:  Abnormal gait, Decreased activity tolerance, Decreased balance, Decreased endurance, Decreased knowledge of use of DME, Decreased mobility, Decreased range of motion, Decreased strength, Difficulty walking, Impaired perceived functional ability, Impaired flexibility, Impaired sensation, Postural dysfunction, Obesity  Visit Diagnosis: Muscle weakness (generalized)  Unsteadiness on feet  Other abnormalities of gait and mobility  Other disturbances of skin sensation     Problem List Patient Active Problem List   Diagnosis Date Noted  . Shingles 10/31/2017  . Estrogen deficiency 10/31/2017  . Diabetic retinopathy ([place]) 10/28/2017  . Mixed stress and urge urinary incontinence 10/03/2017  . Closed right fibular fracture 10/03/2017  . Dependent edema 10/03/2017  .  Lumbosacral radiculopathy 09/20/2017  . Diabetic polyneuropathy associated with type 2 diabetes mellitus ([place]) 09/20/2017  . Ulnar neuropathy at elbow of left upper extremity 09/20/2017  . Coronary artery calcification 02/28/2017  . Neck pain 11/24/2016  . Otalgia of right ear 11/24/2016  . DDD (degenerative disc disease), cervical 11/24/2016  . Arthropathy 11/24/2016  . Left arm numbness 10/31/2016  . Hoarseness 10/31/2016  . Atypical chest pain 03/28/2016  . Tremor of both hands 12/15/2015  . Ataxia 12/15/2015  . Obstructive sleep apnea 11/17/2015  .  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 Periodic limb movements of sleep 11/10/2015  . Obesity hypoventilation syndrome ([place]) 11/10/2015  . Chronic kidney disease (CKD), stage II (mild) 03/05/2013  . COLONIC POLYPS, ADENOMATOUS 05/03/2009  . GLAUCOMA NOS 02/25/2009  . Diabetic neuropathy, type II diabetes mellitus ([place]) 05/28/2008  . LOW BACK PAIN SYNDROME 05/28/2008  . 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 DM (diabetes mellitus), type 2, uncontrolled ([place]) 02/07/2007  . Hyperlipidemia 02/07/2007  . Morbid obesity ([place]) 02/07/2007  . Essential hypertension 02/07/2007  . TB SKIN TEST, POSITIVE 02/07/2007  . TOBACCO USE, QUIT 02/07/2007  . HYSTERECTOMY, HX OF 02/07/2007    Cameron Sprang, PT, MPT [facility] [address] [place], [place], [address] Phone: [phone]   Fax:  [phone] 12/03/17, 4:04 PM  Name: Rebecca Hicks MRN: [id number] Date of Birth: 09-Jun-1945

## 2017-12-06 ENCOUNTER — Ambulatory Visit: Payer: Medicare Other | Admitting: Rehabilitation

## 2017-12-10 ENCOUNTER — Ambulatory Visit: Payer: Medicare Other | Admitting: Physical Therapy

## 2017-12-10 ENCOUNTER — Other Ambulatory Visit: Payer: Self-pay | Admitting: Family Medicine

## 2017-12-13 ENCOUNTER — Ambulatory Visit: Payer: Medicare Other | Admitting: Rehabilitation

## 2017-12-13 NOTE — Therapy (Signed)
[facility] [address] [place], [place], [address] Phone: [phone]   Fax:  [phone]  Patient Details  Name: Rebecca Hicks MRN: [id number] Date of Birth: 04/22/1946 Referring Provider:  Zenia Resides, MD  Encounter Date: 12/13/2017   Pt arrived for visit this morning wanting to speak with PT regarding several family issues that she is currently having with family here and in [place].  She feels that she needs to go there and be with family, therefore will cancel today and next week's visits in order to allow her to take care of family issues. She is to call back if needing to cancel any other visits.     Cameron Sprang, PT, MPT [facility] [address] [place], [place], [address] Phone: [phone]   Fax:  [phone] 12/13/17, 10:13 AM

## 2017-12-17 ENCOUNTER — Ambulatory Visit: Payer: Medicare Other | Admitting: Physical Therapy

## 2017-12-20 ENCOUNTER — Ambulatory Visit: Payer: Medicare Other | Admitting: Rehabilitation

## 2017-12-24 ENCOUNTER — Other Ambulatory Visit: Payer: Self-pay | Admitting: Family Medicine

## 2017-12-24 ENCOUNTER — Ambulatory Visit: Payer: Medicare Other | Admitting: Physical Therapy

## 2017-12-27 ENCOUNTER — Ambulatory Visit: Payer: Medicare Other | Admitting: Rehabilitation

## 2017-12-29 ENCOUNTER — Other Ambulatory Visit: Payer: Self-pay | Admitting: Emergency Medicine

## 2017-12-31 ENCOUNTER — Ambulatory Visit: Payer: Medicare Other | Attending: Orthopedic Surgery | Admitting: Rehabilitation

## 2017-12-31 DIAGNOSIS — M545 Low back pain: Secondary | ICD-10-CM | POA: Insufficient documentation

## 2017-12-31 DIAGNOSIS — R208 Other disturbances of skin sensation: Secondary | ICD-10-CM | POA: Insufficient documentation

## 2017-12-31 DIAGNOSIS — R2681 Unsteadiness on feet: Secondary | ICD-10-CM | POA: Insufficient documentation

## 2017-12-31 DIAGNOSIS — R2689 Other abnormalities of gait and mobility: Secondary | ICD-10-CM | POA: Insufficient documentation

## 2017-12-31 DIAGNOSIS — M6281 Muscle weakness (generalized): Secondary | ICD-10-CM | POA: Insufficient documentation

## 2017-12-31 DIAGNOSIS — G8929 Other chronic pain: Secondary | ICD-10-CM | POA: Insufficient documentation

## 2018-01-03 ENCOUNTER — Ambulatory Visit: Payer: Medicare Other | Admitting: Rehabilitation

## 2018-01-17 ENCOUNTER — Ambulatory Visit: Payer: Medicare Other | Admitting: Rehabilitation

## 2018-01-17 ENCOUNTER — Telehealth: Payer: Self-pay | Admitting: Rehabilitation

## 2018-01-17 ENCOUNTER — Telehealth: Payer: Self-pay | Admitting: Family Medicine

## 2018-01-17 ENCOUNTER — Encounter: Payer: Self-pay | Admitting: Rehabilitation

## 2018-01-17 ENCOUNTER — Telehealth: Payer: Self-pay | Admitting: Cardiovascular Disease

## 2018-01-17 DIAGNOSIS — G8929 Other chronic pain: Secondary | ICD-10-CM

## 2018-01-17 DIAGNOSIS — R2689 Other abnormalities of gait and mobility: Secondary | ICD-10-CM

## 2018-01-17 DIAGNOSIS — M6281 Muscle weakness (generalized): Secondary | ICD-10-CM | POA: Diagnosis not present

## 2018-01-17 DIAGNOSIS — R2681 Unsteadiness on feet: Secondary | ICD-10-CM

## 2018-01-17 DIAGNOSIS — M545 Low back pain: Secondary | ICD-10-CM

## 2018-01-17 DIAGNOSIS — R208 Other disturbances of skin sensation: Secondary | ICD-10-CM

## 2018-01-17 NOTE — Therapy (Signed)
[facility] [address] [place], [place], [address] Phone: [phone]   Fax:  [phone]  Physical Therapy Treatment  Patient Details  Name: Rebecca Hicks MRN: [id number] Date of Birth: [birth year] Referring Provider (PT): Almedia Balls, MD   Encounter Date: 01/17/2018  PT End of Session - 01/17/18 0936    Visit Number  6    Number of Visits  17    Date for PT Re-Evaluation  03/10/18   only plan to see for 6 weeks, 1 week delay for  BP issues   Authorization Type  [employer] Medicare    PT Start Time  [id number]    PT Stop Time  1015    PT Time Calculation (min)  44 min    Equipment Utilized During Treatment  Gait belt    Activity Tolerance  Patient tolerated treatment well    Behavior During Therapy  [facility] for tasks assessed/performed       Past Medical History:  Diagnosis Date  . Abdominal pain 12/05/2011  . Abdominal pain, left upper quadrant 12/03/2012  . Acute on chronic renal failure ([place]) 12/25/2012  . Arthritis   . 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 Ataxia 12/15/2015  . Atypical chest pain 03/28/2016  . Cataract   . CERUMEN IMPACTION, RIGHT 01/31/2010   Qualifier: Diagnosis of  By: Dianah Field MD, Marcello Moores    . Chest tightness 02/23/2014  . Chronic kidney disease (CKD), stage II (mild) 03/05/2013   Looks like baseline creat = 1.2 -1.3.  Watch for overdiuresis.   . COLONIC POLYPS, ADENOMATOUS 05/03/2009   Every five year colonoscopy due to adenomatous polyp found 04/2009    . DDD (degenerative disc disease), cervical 11/24/2016  . Diabetes mellitus   . Diabetic neuropathy, type II diabetes mellitus ([place]) 05/28/2008   Qualifier: Diagnosis of  By: Andria Frames MD, Gwyndolyn Saxon    . Diarrhea 01/21/2015  . DM (diabetes mellitus), type 2, uncontrolled ([place]) 02/07/2007   Qualifier: Diagnosis of  By: Wynetta Emery RN, Doroteo Bradford    . 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 Essential hypertension 02/07/2007   Qualifier: Diagnosis of  By: Wynetta Emery RN,  Doroteo Bradford    . Excessive sleepiness 04/07/2014  . Fatigue 05/04/2011  . Glaucoma   . GLAUCOMA NOS 02/25/2009   Qualifier: Diagnosis of  By: Andria Frames MD, Gwyndolyn Saxon    . Great toe pain 09/18/2012  . History of nuclear stress test    Myoview 11/17: EF 64, Normal pharmacologic nuclear stress test with no evidence of prior infarct or ischemia.  Marland Kitchen Hoarseness 10/31/2016  . Hyperlipidemia   . Hypertension   . LOW BACK PAIN SYNDROME 05/28/2008   Qualifier: Diagnosis of  By: Andria Frames MD, Gwyndolyn Saxon    . Morbid obesity ([place])   . Neck pain 11/24/2016  . Neuromuscular disorder ([place])   . Obesity hypoventilation syndrome ([place]) 11/10/2015  . Obstructive sleep apnea 11/17/2015  . Otalgia of right ear 11/24/2016  . Polyneuropathy in diabetes(357.2)   . Sarcoidosis   . Sinusitis acute 10/03/2010  . 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 - She was fitted with a Small size [employer] Full Face Mask Simplus mask and heated humidification.   . Snoring 09/05/2015  . TB SKIN TEST, POSITIVE 02/07/2007   Annotation: with clear CXR mid 1990's Qualifier: History of  By: Milana Obey, Doroteo Bradford    . TOBACCO USE, QUIT  02/07/2007   Qualifier: Diagnosis of  By: Andria Frames MD, Gwyndolyn Saxon    . Tremor of both hands 12/15/2015  . U R I 03/15/2010   Qualifier: Diagnosis of  By: Andria Frames MD, Gwyndolyn Saxon      Past Surgical History:  Procedure Laterality Date  . ABDOMINAL HYSTERECTOMY    . APPENDECTOMY    . CATARACT EXTRACTION, BILATERAL  03/2010, 04/2010  . COLONOSCOPY  2011  . EYE SURGERY    . POLYPECTOMY  2011   polyps, hems   . SPINE SURGERY     lumbar laminectomy X 2  . TONSILLECTOMY      There were no vitals filed for this visit.  Subjective Assessment - 01/17/18 0933    Subjective  Pt returns following being away for a month-due to family issues.  She reports got injections in knees last week.  L knee is better  but R knee still painful.      Pertinent History  DM, HTN, falls, spinal stenosis of lumbar region, fibular fracture 6/3-mostly healed, has been getting injections in back    Limitations  Walking;Standing    Patient Stated Goals  "I want to be able to get some exercise to get this weight off so my back feels better."     Currently in Pain?  Yes    Pain Score  7    L knee is 5/10   Pain Location  Knee    Pain Orientation  Right;Left    Pain Descriptors / Indicators  Aching    Pain Type  Chronic pain    Pain Onset  More than a month ago    Pain Frequency  Constant    Aggravating Factors   bending makes it worse    Pain Relieving Factors  topical cream          [facility] PT Assessment - 01/17/18 0942      6 Minute Walk- Baseline   6 Minute Walk- Baseline  yes    BP (mmHg)  (!) 148/97    HR (bpm)  82    02 Sat (%RA)  92 %    Modified Borg Scale for Dyspnea  0- Nothing at all    Perceived Rate of Exertion (Borg)  6-      6 Minute walk- Post Test   BP (mmHg)  (!) 189/106   159/98 following 5 mins rest    HR (bpm)  107    02 Sat (%RA)  93 %    Modified Borg Scale for Dyspnea  3- Moderate shortness of breath or breathing difficulty    Perceived Rate of Exertion (Borg)  14-      6 minute walk test results    Aerobic Endurance Distance Walked  841    Endurance additional comments  1 standing rest break at 4 mins for approx 20 secs.  Ambulated with rollator.             Self Care:  Note that BP was elevated at beginning of session prior to 6MWT, however did want to establish BP response to exercise, so proceeded with 6MWT as above.  Note that BP was elevated to unsafe range following activity.  Pt asymptomatic but with SOB following walk.   Provided education on concern for BP remaining elevated during rest and even more so with activity and the increase of CVA.  Provided education on signs/symptoms of CVA and to call 911 if having these symptoms.  Pt verbalized understanding of all  education.                PT Education - 01/17/18 1242    Education Details  Recommendation to go to PCP today in order to address BP issues.  Also recommend that she hold therapy for a week in order to have BP managed medically.     Person(s) Educated  Patient    Methods  Explanation    Comprehension  Verbalized understanding       PT Short Term Goals - 01/17/18 1301      PT SHORT TERM GOAL #1   Title  Pt will be independent with initial HEP in order to indicate improved functional mobility.  (Target Date: 02/14/18-goal date reflects 1 week delay in start)    Time  3    Period  Weeks    Status  On-going    Target Date  02/14/18      PT SHORT TERM GOAL #2   Title  Will re-assess DGI and set appropriate LTG.     Baseline  12/24 on 11/11/17    Time  3    Period  Weeks    Status  New      PT SHORT TERM GOAL #3   Title  Pt will be able to tolerate up to 6 minutes of activity without unsafe change in BP.     Time  3    Period  Weeks    Status  New      PT SHORT TERM GOAL #4   Title  Will assess 5TSS and set appropriate LTG to reflect improved LE strength and decreased fall risk.     Time  3    Period  Weeks    Status  New      PT SHORT TERM GOAL #5   Title  Pt will ambulate x 300' w/ rollator at mod I level over indoor sufaces in order to indicate safe indoor community mobility.      Status  New      PT SHORT TERM GOAL #6   Status  New        PT Long Term Goals - 01/17/18 0936      PT LONG TERM GOAL #1   Title  Pt will be independent with final HEP and report transition to [employer] program in order to maintain mobility gains made in therapy.  (Target Date: 03/10/18-date reflects 1 week delay in start)    Time  6    Period  Weeks    Status  New    Target Date  03/10/18      PT LONG TERM GOAL #2   Title  Pt will improve DGI >19/24 in order to indicate decreased fall risk.      Status  New      PT LONG TERM GOAL #3   Title  Pt will improve  6MWT w/ LRAD by 32' from baseline in order to indicate improved functional endurance.     Baseline  841' on 01/17/18    Status  Revised      PT LONG TERM GOAL #4   Title  Pt will ambulate up to 500' w/ LRAD over indoor/level and unlevel outdoor paved surfaces at mod I level in order to indicate safe community mobility.      Status  New      PT LONG TERM GOAL #5   Title  Pt will report return to shopping at [employer] and  cooking at home in order to indicate functional improvement in endurance and standing tolerance.     Status  New            Plan - 01/17/18 1250    Clinical Impression Statement  Skilled session focused on re-establishing baselines to re-cert for new POC.  Performed 6MWT (note that BP was elevated prior but wanted to capture BP response to exercise) with improved distance from last assessment (679' baseline to 21' today) however note that BP elevated to unsafe exercise level (189/106) however did return to baseline after 5 mins.  Educated on concern with BP maintaining at such an elevated level during rest and with activity.  Recommend that she go to PCP today to address BP issues and will place on hold for 1 week to allow medical management of BP prior to return.  Will re-start POC to 6 weeks at 2x/week.      Rehab Potential  Good    Clinical Impairments Affecting Rehab Potential  co-morbidities including spinal stenosis and B knee pain    PT Frequency  2x / week    PT Duration  8 weeks    PT Treatment/Interventions  ADLs/Self Care Home Management;DME Instruction;Gait training;Stair training;Functional mobility training;Therapeutic activities;Therapeutic exercise;Balance training;Neuromuscular re-education;Patient/family education;Orthotic Fit/Training;Passive range of motion;Energy conservation;Vestibular    PT Next Visit Plan  Continue to assess LTGs to establish new goals, did BP get managed? continue to work on gait/barriers outside weather permitting with  rollator walker; see if order came in; continue to work on [place] and balance.     Consulted and Agree with Plan of Care  Patient       Patient will benefit from skilled therapeutic intervention in order to improve the following deficits and impairments:  Abnormal gait, Decreased activity tolerance, Decreased balance, Decreased endurance, Decreased knowledge of use of DME, Decreased mobility, Decreased range of motion, Decreased strength, Difficulty walking, Impaired perceived functional ability, Impaired flexibility, Impaired sensation, Postural dysfunction, Obesity  Visit Diagnosis: Muscle weakness (generalized)  Unsteadiness on feet  Other abnormalities of gait and mobility  Other disturbances of skin sensation  Chronic midline low back pain, with sciatica presence unspecified     Problem List Patient Active Problem List   Diagnosis Date Noted  . Shingles 10/31/2017  . Estrogen deficiency 10/31/2017  . Diabetic retinopathy ([place]) 10/28/2017  . Mixed stress and urge urinary incontinence 10/03/2017  . Closed right fibular fracture 10/03/2017  . Dependent edema 10/03/2017  . Lumbosacral radiculopathy 09/20/2017  . Diabetic polyneuropathy associated with type 2 diabetes mellitus ([place]) 09/20/2017  . Ulnar neuropathy at elbow of left upper extremity 09/20/2017  . Coronary artery calcification 02/28/2017  . Neck pain 11/24/2016  . Otalgia of right ear 11/24/2016  . DDD (degenerative disc disease), cervical 11/24/2016  . Arthropathy 11/24/2016  . Left arm numbness 10/31/2016  . Hoarseness 10/31/2016  . Atypical chest pain 03/28/2016  . Tremor of both hands 12/15/2015  . Ataxia 12/15/2015  . Obstructive sleep apnea 11/17/2015  . 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 Periodic limb movements of sleep 11/10/2015  . Obesity hypoventilation syndrome ([place]) 11/10/2015  . Chronic kidney disease (CKD), stage II (mild)  03/05/2013  . COLONIC POLYPS, ADENOMATOUS 05/03/2009  . GLAUCOMA NOS 02/25/2009  . Diabetic neuropathy, type II diabetes mellitus ([place]) 05/28/2008  . LOW BACK PAIN SYNDROME 05/28/2008  . 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 DM (diabetes mellitus), type 2, uncontrolled ([place]) 02/07/2007  . Hyperlipidemia 02/07/2007  .  Morbid obesity ([place]) 02/07/2007  . Essential hypertension 02/07/2007  . TB SKIN TEST, POSITIVE 02/07/2007  . TOBACCO USE, QUIT 02/07/2007  . HYSTERECTOMY, HX OF 02/07/2007    Cameron Sprang, PT, MPT [facility] [address] [place], [place], [address] Phone: [phone]   Fax:  [phone] 01/17/18, 3:15 PM  Name: DESHANNON SEIDE MRN: [id number] Date of Birth: Jun 03, 1945

## 2018-01-17 NOTE — Telephone Encounter (Signed)
Pt called and would like to speak to Dr. Andria Frames about being seen. She said that nurse that comes and sees her will be sending him the notes from today's visit and would like to know what she should do.

## 2018-01-17 NOTE — Telephone Encounter (Signed)
Spoke with pt today who states she is having chest pain. She states she has had chest pressure, chest pain, SOB, diaphoresis x 3 days. Pt states pain is not relieved by rest, but sometimes is not constant. Pt has an appt with an APP on Monday 9/30, but I was concerned of something more serious given pt's history. 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 I have advised pt to go to the ED for further assessment and testing. Pt agrees and will head to the ED today.

## 2018-01-17 NOTE — Telephone Encounter (Signed)
Dr. Andria Frames,   I am working with Rebecca Hicks at [facility] neuro for PT.  Note that we are re-establishing care following her taking a month off due to family issues and upon performing 6MWT baseline BP was taken.  BP prior to ambulation was 148/97.  BP following 6 minute walk test (with one standing rest break) was 189/106.  Following 5 min rest break, BP returned close to baseline at 159/98.  She reports her baselines at home are very high, but that she has not taken it post activity.  We are unable to work with [name] with BP this elevated.  She was going to call your office to set up appt, but I also wanted to let you know.    Thanks,  Cameron Sprang, PT, MPT [facility] [address] [place], [place], [address] Phone: [phone]   Fax:  [phone] 01/17/18, 8:33 PM

## 2018-01-17 NOTE — Telephone Encounter (Signed)
° °  Pt c/o of Chest Pain: STAT if CP now or developed within 24 hours  1. Are you having CP right now? Chest heaviness/ tightness right now. Patient just came from [facility]  2. Are you experiencing any other symptoms (ex. SOB, nausea, vomiting, sweating)? SOB at times  3. How long have you been experiencing CP? 2-3 weeks  4. Is your CP continuous or coming and going? Coming and going  5. Have you taken Nitroglycerin? No ?

## 2018-01-20 ENCOUNTER — Ambulatory Visit: Payer: Medicare Other | Admitting: Physician Assistant

## 2018-01-20 ENCOUNTER — Encounter: Payer: Self-pay | Admitting: Physician Assistant

## 2018-01-20 VITALS — BP 118/78 | HR 77 | Ht 66.0 in | Wt 288.2 lb

## 2018-01-20 DIAGNOSIS — E785 Hyperlipidemia, unspecified: Secondary | ICD-10-CM

## 2018-01-20 DIAGNOSIS — R0609 Other forms of dyspnea: Secondary | ICD-10-CM | POA: Diagnosis not present

## 2018-01-20 DIAGNOSIS — R0602 Shortness of breath: Secondary | ICD-10-CM

## 2018-01-20 DIAGNOSIS — R0600 Dyspnea, unspecified: Secondary | ICD-10-CM

## 2018-01-20 DIAGNOSIS — R079 Chest pain, unspecified: Secondary | ICD-10-CM

## 2018-01-20 DIAGNOSIS — I2510 Atherosclerotic heart disease of native coronary artery without angina pectoris: Secondary | ICD-10-CM

## 2018-01-20 DIAGNOSIS — I2584 Coronary atherosclerosis due to calcified coronary lesion: Secondary | ICD-10-CM

## 2018-01-20 MED ORDER — NITROGLYCERIN 0.4 MG SL SUBL: 0.4000 mg | SUBLINGUAL_TABLET | SUBLINGUAL | 3 refills | Status: DC | PRN

## 2018-01-20 NOTE — Telephone Encounter (Signed)
Called patient.  Seen by cards this morning and had normal BP.  Asymptomatic.  She has an appointment with me on 10/8.  She will check BP multiple times, record and bring in that list.  Also verified that she was taking all her BP meds.

## 2018-01-20 NOTE — Progress Notes (Signed)
Cardiology Office Note    Date:  01/20/2018   ID:  Rebecca Hicks, DOB 24-Apr-1945, MRN [id number]  PCP:  Zenia Resides, MD  Cardiologist: Dr. Acie Fredrickson  Chief Complaint: Chest pain and shortness of breath   History of Present Illness:   Rebecca Hicks is a 72 y.o. female with hx of HTN, HLD, DM, pulmonary sarcoidosis, CKD stage III and morbid obesity presents for chest pain evaluation.   Normal myoview 02/2014 & 02/2016. Coronary artery calcification on CT of chest 09/2016. Last seen by Dr. Acie Fredrickson 02/2017.  Patient is added to my schedule for 3 weeks hx of chest pain. She describes as "dull heaviness".  Occurs with and without exertion.  Associated with shortness of breath.  She does physical therapy to times per week for her knee and back issue.  Her pain not always occurs during PT.  Lasts for 20 to 30 minutes with self resolution.  Occasionally radiates to her right shoulder only.  Patient takes Lasix as needed for edema.  She denies orthopnea, PND, syncope, dizziness or melena.  Compliant with low-sodium diet.  Patient has intermittent bloating and diarrhea.  Past Medical History:  Diagnosis Date  . Abdominal pain 12/05/2011  . Abdominal pain, left upper quadrant 12/03/2012  . Acute on chronic renal failure ([place]) 12/25/2012  . Arthritis   . 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 Ataxia 12/15/2015  . Atypical chest pain 03/28/2016  . Cataract   . CERUMEN IMPACTION, RIGHT 01/31/2010   Qualifier: Diagnosis of  By: Dianah Field MD, Marcello Moores    . Chest tightness 02/23/2014  . Chronic kidney disease (CKD), stage II (mild) 03/05/2013   Looks like baseline creat = 1.2 -1.3.  Watch for overdiuresis.   . COLONIC POLYPS, ADENOMATOUS 05/03/2009   Every five year colonoscopy due to adenomatous polyp found 04/2009    . DDD (degenerative disc disease), cervical 11/24/2016  . Diabetes mellitus   . Diabetic neuropathy, type II diabetes mellitus ([place]) 05/28/2008   Qualifier: Diagnosis of  By:  Andria Frames MD, Gwyndolyn Saxon    . Diarrhea 01/21/2015  . DM (diabetes mellitus), type 2, uncontrolled ([place]) 02/07/2007   Qualifier: Diagnosis of  By: Wynetta Emery RN, Doroteo Bradford    . 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 Essential hypertension 02/07/2007   Qualifier: Diagnosis of  By: Wynetta Emery RN, Doroteo Bradford    . Excessive sleepiness 04/07/2014  . Fatigue 05/04/2011  . Glaucoma   . GLAUCOMA NOS 02/25/2009   Qualifier: Diagnosis of  By: Andria Frames MD, Gwyndolyn Saxon    . Great toe pain 09/18/2012  . History of nuclear stress test    Myoview 11/17: EF 64, Normal pharmacologic nuclear stress test with no evidence of prior infarct or ischemia.  Marland Kitchen Hoarseness 10/31/2016  . Hyperlipidemia   . Hypertension   . LOW BACK PAIN SYNDROME 05/28/2008   Qualifier: Diagnosis of  By: Andria Frames MD, Gwyndolyn Saxon    . Morbid obesity ([place])   . Neck pain 11/24/2016  . Neuromuscular disorder ([place])   . Obesity hypoventilation syndrome ([place]) 11/10/2015  . Obstructive sleep apnea 11/17/2015  . Otalgia of right ear 11/24/2016  . Polyneuropathy in diabetes(357.2)   . Sarcoidosis   . Sinusitis acute 10/03/2010  . 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 - She was fitted with a Small size [employer] Full Face Mask Simplus mask and heated humidification.   . Snoring  09/05/2015  . TB SKIN TEST, POSITIVE 02/07/2007   Annotation: with clear CXR mid 1990's Qualifier: History of  By: Milana Obey, Doroteo Bradford    . TOBACCO USE, QUIT 02/07/2007   Qualifier: Diagnosis of  By: Andria Frames MD, Gwyndolyn Saxon    . Tremor of both hands 12/15/2015  . U R I 03/15/2010   Qualifier: Diagnosis of  By: Andria Frames MD, Gwyndolyn Saxon      Past Surgical History:  Procedure Laterality Date  . ABDOMINAL HYSTERECTOMY    . APPENDECTOMY    . CATARACT EXTRACTION, BILATERAL  03/2010, 04/2010  . COLONOSCOPY  2011  . EYE SURGERY    . POLYPECTOMY  2011   polyps, hems   . SPINE SURGERY     lumbar  laminectomy X 2  . TONSILLECTOMY      Current Medications: Prior to Admission medications   Medication Sig Start Date End Date Taking? Authorizing Provider  acetaminophen (TYLENOL) 650 MG CR tablet Take 650 mg by mouth.    [provider]  albuterol (PROVENTIL HFA;VENTOLIN HFA) 108 (90 Base) MCG/ACT inhaler Inhale 2 puffs into the lungs every 4 (four) hours as needed for wheezing or shortness of breath (cough, shortness of breath or wheezing.). 05/17/16   Ivar Drape D, PA  amLODipine (NORVASC) 10 MG tablet TAKE 1 TABLET BY MOUTH  DAILY 02/18/17   Zenia Resides, MD  aspirin 81 MG tablet Take 81 mg by mouth daily.      [provider]  baclofen (LIORESAL) 10 MG tablet Take 10 mg by mouth daily as needed for muscle spasms.    [provider]  Blood Glucose Monitoring Suppl (ONE TOUCH ULTRA 2) w/Device KIT Test blood sugar three times per day. 06/06/17   Zenia Resides, MD  Blood Pressure Monitoring (BLOOD PRESSURE KIT) DEVI 1 Act by Does not apply route daily. 06/06/17   Zenia Resides, MD  cloNIDine (CATAPRES) 0.1 MG tablet TAKE 1 TABLET BY MOUTH 3  TIMES DAILY Patient taking differently: TAKE 1 TABLET BY MOUTH 2  TIMES DAILY 04/25/17   Zenia Resides, MD  diphenoxylate-atropine (LOMOTIL) 2.5-0.025 MG tablet Take 1 tablet by mouth 4 (four) times daily as needed for diarrhea or loose stools. 10/13/15   Zenia Resides, MD  DULoxetine (CYMBALTA) 30 MG capsule TAKE 1 CAPSULE BY MOUTH  DAILY 03/27/17   Zenia Resides, MD  empagliflozin (JARDIANCE) 25 MG TABS tablet Take 25 mg by mouth daily. 09/05/17   Zenia Resides, MD  esomeprazole (NEXIUM) 40 MG capsule TAKE 1 CAPSULE BY MOUTH  DAILY 09/23/17   Collene Gobble, MD  furosemide (LASIX) 40 MG tablet Take 1 tablet (40 mg total) by mouth daily. Patient taking differently: Take 20 mg by mouth daily as needed for fluid.  07/25/17   Zenia Resides, MD  gabapentin (NEURONTIN) 100 MG capsule Take 1 capsule  (100 mg total) by mouth 3 (three) times daily. 10/31/17   Zenia Resides, MD  HYDROcodone-acetaminophen (NORCO/VICODIN) 5-325 MG tablet Take 1 tablet by mouth every 4 (four) hours as needed. Patient not taking: Reported on 11/08/2017 09/23/17   Recardo Evangelist, PA-C  ibuprofen (ADVIL,MOTRIN) 200 MG tablet Take 200 mg by mouth every 8 (eight) hours as needed.    [provider]  insulin degludec (TRESIBA FLEXTOUCH) 100 UNIT/ML SOPN FlexTouch Pen Inject 0.4 mLs (40 Units total) into the skin daily. 09/05/17   Zenia Resides, MD  insulin lispro (HUMALOG KWIKPEN) 100 UNIT/ML KiwkPen Inject  0.2 mLs (20 Units total) into the skin 2 (two) times daily before a meal. Dispense QS month supply Patient taking differently: Inject 10-20 Units into the skin 2 (two) times daily before a meal. Dispense QS month supply 07/25/17   Zenia Resides, MD  Insulin Pen Needle (BD ULTRA-FINE PEN NEEDLES) 29G X 12.7MM MISC Use with insulin - now take 5 shots per day 06/06/17   Zenia Resides, MD  liraglutide (VICTOZA) 18 MG/3ML SOPN Inject 0.2 mLs (1.2 mg total) into the skin daily. Patient taking differently: Inject 1.8 mg into the skin daily.  09/05/17   Zenia Resides, MD  metFORMIN (GLUCOPHAGE) 500 MG tablet Take 500 mg by mouth 2 (two) times daily with a meal.    [provider]  metoprolol succinate (TOPROL-XL) 100 MG 24 hr tablet Take 50 mg by mouth daily. Take with or immediately following a meal.     [provider]  ONE TOUCH ULTRA TEST test strip USE TO TEST BLOOD SUGAR 3  TIMES DAILY 11/14/16   Zenia Resides, MD  pregabalin (LYRICA) 75 MG capsule Take 1 capsule (75 mg total) by mouth 2 (two) times daily. Patient taking differently: Take 75 mg by mouth at bedtime.  06/06/17   Zenia Resides, MD  rosuvastatin (CRESTOR) 10 MG tablet Take 1 tablet (10 mg total) by mouth 3 (three) times a week. 09/06/17   Zenia Resides, MD  spironolactone (ALDACTONE) 25 MG tablet TAKE 1 TABLET  BY MOUTH  DAILY 05/15/17   Zenia Resides, MD  SYMBICORT 160-4.5 MCG/ACT inhaler INHALE 2 PUFFS INTO THE  LUNGS TWO TIMES DAILY Patient taking differently: INHALE 2 PUFFS INTO THE  LUNGS DAILY 04/25/16   Collene Gobble, MD  telmisartan-hydrochlorothiazide (MICARDIS HCT) 80-12.5 MG tablet TAKE 1 TABLET BY MOUTH  DAILY 12/13/17   Zenia Resides, MD  timolol (TIMOPTIC) 0.25 % ophthalmic solution Place 1 drop into both eyes 2 (two) times daily. Per optho     [provider]  tolterodine (DETROL) 1 MG tablet Take 1 tablet (1 mg total) by mouth at bedtime. Patient taking differently: Take 2 mg by mouth at bedtime.  10/03/17   Zenia Resides, MD    Allergies:   Penicillins   Social History   Socioeconomic History  . Marital status: Single    Spouse name: Not on file  . Number of children: 0  . Years of education: college  . Highest education level: Not on file  Occupational History  . Occupation: retired    Fish farm manager: [employer]  . Financial resource strain: Not on file  . Food insecurity:    Worry: Not on file    Inability: Not on file  . Transportation needs:    Medical: Not on file    Non-medical: Not on file  Tobacco Use  . Smoking status: Former Smoker    Packs/day: 1.00    Years: 20.00    Pack years: 20.00    Types: Cigarettes    Last attempt to quit: 04/24/1995    Years since quitting: 22.7  . Smokeless tobacco: Never Used  Substance and Sexual Activity  . Alcohol use: No    Alcohol/week: 0.0 standard drinks  . Drug use: No  . Sexual activity: Not on file  Lifestyle  . Physical activity:    Days per week: Not on file    Minutes per session: Not on file  . Stress: Not on  file  Relationships  . Social connections:    Talks on phone: Not on file    Gets together: Not on file    Attends religious service: Not on file    Active member of club or organization: Not on file    Attends meetings of clubs or organizations: Not on file     Relationship status: Not on file  Other Topics Concern  . Not on file  Social History Narrative   Lives with 2 sisters and a nephew in a 2 story home but stays on the first floor.  Retired from the [employer].  Education: college.      Family History:  The patient's family history includes Alcohol abuse in her brother and father; Breast cancer in her maternal aunt; Cervical cancer in her mother; Diabetes in her unknown relative; Heart disease in her brother; Hypertension in her unknown relative; Pancreatic cancer in her maternal uncle; Stroke in her father and paternal grandmother.   ROS:   Please see the history of present illness.    ROS All other systems reviewed and are negative.   PHYSICAL EXAM:   VS:  BP 118/78   Pulse 77   Ht '5\' 6"'$  (1.676 m)   Wt 288 lb 2.9 oz (130.7 kg)   SpO2 94%   BMI 46.51 kg/m    GEN: Obese female in no acute distress  HEENT: normal  Neck: no JVD, carotid bruits, or masses Cardiac: RRR; no murmurs, rubs, or gallops, 1+ bilateral lower extremity edema  Respiratory:  clear to auscultation bilaterally, normal work of breathing GI: soft, nontender, nondistended, + BS MS: no deformity or atrophy  Skin: warm and dry, no rash Neuro:  Alert and Oriented x 3, Strength and sensation are intact Psych: euthymic mood, full affect  Wt Readings from Last 3 Encounters:  01/20/18 288 lb 2.9 oz (130.7 kg)  10/31/17 295 lb 6.4 oz (134 kg)  10/03/17 292 lb (132.5 kg)      Studies/Labs Reviewed:   EKG:  EKG is ordered today.  The ekg ordered today demonstrates sinus rhythm at rate of 77 bpm  Recent Labs: 05/09/2017: ALT 13 09/23/2017: BUN 38; Creatinine, Ser 1.48; Hemoglobin 13.3; Platelets 190; Potassium 4.4; Sodium 138   Lipid Panel    Component Value Date/Time   CHOL 288 (H) 05/09/2017 1025   TRIG 103 05/09/2017 1025   HDL 57 05/09/2017 1025   CHOLHDL 5.1 (H) 05/09/2017 1025   CHOLHDL 4.1 01/20/2015 1003   VLDL 10 01/20/2015 1003   LDLCALC 210  (H) 05/09/2017 1025   LDLDIRECT 223 (H) 10/13/2015 0932    Additional studies/ records that were reviewed today include:   As discussed above    ASSESSMENT & PLAN:   1.  Chest pain Mixed symptoms.  Prior ischemic work-up negative.  She had a normal stress test in 2015 and 2017.  Coronary calcification by CT of the chest. EKG without acute changes.  Certainly, she could have underlying CAD.  Her cardiac risk factor includes hypertension, hyperlipidemia, diabetes and obesity.  Continue aspirin, statin, beta-blocker.  Trial of PRN sublingual nitroglycerin.  Get echocardiogram.  2.  Bilateral lower extremity edema -This could be due to high-dose amlodipine.  Continue to take diuretics.  Get echocardiogram.  3.  Hyperlipidemia -LDL 210.  He currently takes Crestor 10 mg 3 times per week.  Will refer to lipid clinic.  4.  Hypertension -Stable today on current medication.  No change.  5.  Diabetes -  Management per primary care provider.  Plan reviewed with Dr. Acie Fredrickson.    Medication Adjustments/Labs and Tests Ordered: Current medicines are reviewed at length with the patient today.  Concerns regarding medicines are outlined above.  Medication changes, Labs and Tests ordered today are listed in the Patient Instructions below. Patient Instructions  Medication Instructions:  Your physician recommends that you continue on your current medications as directed. Please refer to the Current Medication list given to you today.  A prescription for Sublingual Nitroglycerin has been sent to your local pharmacy. Take as directed. Labwork: None ordered  Testing/Procedures: Your physician has requested that you have an echocardiogram. Echocardiography is a painless test that uses sound waves to create images of your heart. It provides your doctor with information about the size and shape of your heart and how well your heart's chambers and valves are working. This procedure takes approximately one  hour. There are no restrictions for this procedure.  Follow-Up: You have been referred to the Lipid Clinic at [facility] with the PharmD for hyperlipidemia.   Keep follow-up appointment with Dr. Acie Fredrickson 03/03/18 at 3:40 PM   Any Other Special Instructions Will Be Listed Below (If Applicable).  Echocardiogram An echocardiogram, or echocardiography, uses sound waves (ultrasound) to produce an image of your heart. The echocardiogram is simple, painless, obtained within a short period of time, and offers valuable information to your health care provider. The images from an echocardiogram can provide information such as:  Evidence of coronary artery disease (CAD).  Heart size.  Heart muscle function.  Heart valve function.  Aneurysm detection.  Evidence of a past heart attack.  Fluid buildup around the heart.  Heart muscle thickening.  Assess heart valve function.  Tell a health care provider about:  Any allergies you have.  All medicines you are taking, including vitamins, herbs, eye drops, creams, and over-the-counter medicines.  Any problems you or family members have had with anesthetic medicines.  Any blood disorders you have.  Any surgeries you have had.  Any medical conditions you have.  Whether you are pregnant or may be pregnant. What happens before the procedure? No special preparation is needed. Eat and drink normally. What happens during the procedure?  In order to produce an image of your heart, gel will be applied to your chest and a wand-like tool (transducer) will be moved over your chest. The gel will help transmit the sound waves from the transducer. The sound waves will harmlessly bounce off your heart to allow the heart images to be captured in real-time motion. These images will then be recorded.  You may need an IV to receive a medicine that improves the quality of the pictures. What happens after the procedure? You may return to your normal  schedule including diet, activities, and medicines, unless your health care provider tells you otherwise. This information is not intended to replace advice given to you by your health care provider. Make sure you discuss any questions you have with your health care provider. Document Released: 04/06/2000 Document Revised: 11/26/2015 Document Reviewed: 12/15/2012 Elsevier Interactive Patient Education  2017 [place].   Nitroglycerin sublingual tablets What is this medicine? NITROGLYCERIN (nye troe GLI ser in) is a type of vasodilator. It relaxes blood vessels, increasing the blood and oxygen supply to your heart. This medicine is used to relieve chest pain caused by angina. It is also used to prevent chest pain before activities like climbing stairs, going outdoors in cold weather, or sexual activity.  This medicine may be used for other purposes; ask your health care provider or pharmacist if you have questions. COMMON BRAND NAME(S): Nitroquick, Nitrostat, Nitrotab What should I tell my health care provider before I take this medicine? They need to know if you have any of these conditions: -anemia -head injury, recent stroke, or bleeding in the brain -liver disease -previous heart attack -an unusual or allergic reaction to nitroglycerin, other medicines, foods, dyes, or preservatives -pregnant or trying to get pregnant -breast-feeding How should I use this medicine? Take this medicine by mouth as needed. At the first sign of an angina attack (chest pain or tightness) place one tablet under your tongue. You can also take this medicine 5 to 10 minutes before an event likely to produce chest pain. Follow the directions on the prescription label. Let the tablet dissolve under the tongue. Do not swallow whole. Replace the dose if you accidentally swallow it. It will help if your mouth is not dry. Saliva around the tablet will help it to dissolve more quickly. Do not eat or drink, smoke or chew  tobacco while a tablet is dissolving. If you are not better within 5 minutes after taking ONE dose of nitroglycerin, call 9-1-1 immediately to seek emergency medical care. Do not take more than 3 nitroglycerin tablets over 15 minutes. If you take this medicine often to relieve symptoms of angina, your doctor or health care professional may provide you with different instructions to manage your symptoms. If symptoms do not go away after following these instructions, it is important to call 9-1-1 immediately. Do not take more than 3 nitroglycerin tablets over 15 minutes. Talk to your pediatrician regarding the use of this medicine in children. Special care may be needed. Overdosage: If you think you have taken too much of this medicine contact a poison control center or emergency room at once. NOTE: This medicine is only for you. Do not share this medicine with others. What if I miss a dose? This does not apply. This medicine is only used as needed. What may interact with this medicine? Do not take this medicine with any of the following medications: -certain migraine medicines like ergotamine and dihydroergotamine (DHE) -medicines used to treat erectile dysfunction like sildenafil, tadalafil, and vardenafil -riociguat This medicine may also interact with the following medications: -alteplase -aspirin -heparin -medicines for high blood pressure -medicines for mental depression -other medicines used to treat angina -phenothiazines like chlorpromazine, mesoridazine, prochlorperazine, thioridazine This list may not describe all possible interactions. Give your health care provider a list of all the medicines, herbs, non-prescription drugs, or dietary supplements you use. Also tell them if you smoke, drink alcohol, or use illegal drugs. Some items may interact with your medicine. What should I watch for while using this medicine? Tell your doctor or health care professional if you feel your medicine  is no longer working. Keep this medicine with you at all times. Sit or lie down when you take your medicine to prevent falling if you feel dizzy or faint after using it. Try to remain calm. This will help you to feel better faster. If you feel dizzy, take several deep breaths and lie down with your feet propped up, or bend forward with your head resting between your knees. You may get drowsy or dizzy. Do not drive, use machinery, or do anything that needs mental alertness until you know how this drug affects you. Do not stand or sit up quickly, especially if you are an older  patient. This reduces the risk of dizzy or fainting spells. Alcohol can make you more drowsy and dizzy. Avoid alcoholic drinks. Do not treat yourself for coughs, colds, or pain while you are taking this medicine without asking your doctor or health care professional for advice. Some ingredients may increase your blood pressure. What side effects may I notice from receiving this medicine? Side effects that you should report to your doctor or health care professional as soon as possible: -blurred vision -dry mouth -skin rash -sweating -the feeling of extreme pressure in the head -unusually weak or tired Side effects that usually do not require medical attention (report to your doctor or health care professional if they continue or are bothersome): -flushing of the face or neck -headache -irregular heartbeat, palpitations -nausea, vomiting This list may not describe all possible side effects. Call your doctor for medical advice about side effects. You may report side effects to FDA at 1-800-FDA-1088. Where should I keep my medicine? Keep out of the reach of children. Store at room temperature between 20 and 25 degrees C (68 and 77 degrees F). Store in [employer]. Protect from light and moisture. Keep tightly closed. Throw away any unused medicine after the expiration date. NOTE: This sheet is a summary. It may not cover  all possible information. If you have questions about this medicine, talk to your doctor, pharmacist, or health care provider.  2018 Elsevier/Gold Standard (2013-02-05 17:57:36)    If you need a refill on your cardiac medications before your next appointment, please call your pharmacy.      Jarrett Soho, [place]  01/20/2018 8:57 AM    [facility] [address], [place], [place]  [address] Phone: [phone]; Fax: [phone]

## 2018-01-20 NOTE — Addendum Note (Signed)
Addended by: Drue Novel I on: 01/20/2018 11:33 AM   Modules accepted: Orders

## 2018-01-20 NOTE — Telephone Encounter (Signed)
Called.  Seen by cards this morning and had normal BP.  Asymptomatic.  She has an appointment with me on 10/8.  She will check BP multiple times, record and bring in that list.  Also verified that she was taking all her BP meds.

## 2018-01-20 NOTE — Patient Instructions (Addendum)
Medication Instructions:  Your physician recommends that you continue on your current medications as directed. Please refer to the Current Medication list given to you today.  A prescription for Sublingual Nitroglycerin has been sent to your local pharmacy. Take as directed. Labwork: None ordered  Testing/Procedures: Your physician has requested that you have an echocardiogram. Echocardiography is a painless test that uses sound waves to create images of your heart. It provides your doctor with information about the size and shape of your heart and how well your heart's chambers and valves are working. This procedure takes approximately one hour. There are no restrictions for this procedure.  Follow-Up: You have been referred to the [facility] at [facility] with the PharmD for hyperlipidemia.   Keep follow-up appointment with Dr. Acie Fredrickson 03/03/18 at 3:40 PM   Any Other Special Instructions Will Be Listed Below (If Applicable).  Echocardiogram An echocardiogram, or echocardiography, uses sound waves (ultrasound) to produce an image of your heart. The echocardiogram is simple, painless, obtained within a short period of time, and offers valuable information to your health care provider. The images from an echocardiogram can provide information such as:  Evidence of coronary artery disease (CAD).  Heart size.  Heart muscle function.  Heart valve function.  Aneurysm detection.  Evidence of a past heart attack.  Fluid buildup around the heart.  Heart muscle thickening.  Assess heart valve function.  Tell a health care provider about:  Any allergies you have.  All medicines you are taking, including vitamins, herbs, eye drops, creams, and over-the-counter medicines.  Any problems you or family members have had with anesthetic medicines.  Any blood disorders you have.  Any surgeries you have had.  Any medical conditions you have.  Whether you are pregnant or may be  pregnant. What happens before the procedure? No special preparation is needed. Eat and drink normally. What happens during the procedure?  In order to produce an image of your heart, gel will be applied to your chest and a wand-like tool (transducer) will be moved over your chest. The gel will help transmit the sound waves from the transducer. The sound waves will harmlessly bounce off your heart to allow the heart images to be captured in real-time motion. These images will then be recorded.  You may need an IV to receive a medicine that improves the quality of the pictures. What happens after the procedure? You may return to your normal schedule including diet, activities, and medicines, unless your health care provider tells you otherwise. This information is not intended to replace advice given to you by your health care provider. Make sure you discuss any questions you have with your health care provider. Document Released: 04/06/2000 Document Revised: 11/26/2015 Document Reviewed: 12/15/2012 Elsevier Interactive Patient Education  2017 [place].   Nitroglycerin sublingual tablets What is this medicine? NITROGLYCERIN (nye troe GLI ser in) is a type of vasodilator. It relaxes blood vessels, increasing the blood and oxygen supply to your heart. This medicine is used to relieve chest pain caused by angina. It is also used to prevent chest pain before activities like climbing stairs, going outdoors in cold weather, or sexual activity. This medicine may be used for other purposes; ask your health care provider or pharmacist if you have questions. COMMON BRAND NAME(S): Nitroquick, Nitrostat, Nitrotab What should I tell my health care provider before I take this medicine? They need to know if you have any of these conditions: -anemia -head injury, recent stroke, or  bleeding in the brain -liver disease -previous heart attack -an unusual or allergic reaction to nitroglycerin, other  medicines, foods, dyes, or preservatives -pregnant or trying to get pregnant -breast-feeding How should I use this medicine? Take this medicine by mouth as needed. At the first sign of an angina attack (chest pain or tightness) place one tablet under your tongue. You can also take this medicine 5 to 10 minutes before an event likely to produce chest pain. Follow the directions on the prescription label. Let the tablet dissolve under the tongue. Do not swallow whole. Replace the dose if you accidentally swallow it. It will help if your mouth is not dry. Saliva around the tablet will help it to dissolve more quickly. Do not eat or drink, smoke or chew tobacco while a tablet is dissolving. If you are not better within 5 minutes after taking ONE dose of nitroglycerin, call 9-1-1 immediately to seek emergency medical care. Do not take more than 3 nitroglycerin tablets over 15 minutes. If you take this medicine often to relieve symptoms of angina, your doctor or health care professional may provide you with different instructions to manage your symptoms. If symptoms do not go away after following these instructions, it is important to call 9-1-1 immediately. Do not take more than 3 nitroglycerin tablets over 15 minutes. Talk to your pediatrician regarding the use of this medicine in children. Special care may be needed. Overdosage: If you think you have taken too much of this medicine contact a poison control center or emergency room at once. NOTE: This medicine is only for you. Do not share this medicine with others. What if I miss a dose? This does not apply. This medicine is only used as needed. What may interact with this medicine? Do not take this medicine with any of the following medications: -certain migraine medicines like ergotamine and dihydroergotamine (DHE) -medicines used to treat erectile dysfunction like sildenafil, tadalafil, and vardenafil -riociguat This medicine may also interact with  the following medications: -alteplase -aspirin -heparin -medicines for high blood pressure -medicines for mental depression -other medicines used to treat angina -phenothiazines like chlorpromazine, mesoridazine, prochlorperazine, thioridazine This list may not describe all possible interactions. Give your health care provider a list of all the medicines, herbs, non-prescription drugs, or dietary supplements you use. Also tell them if you smoke, drink alcohol, or use illegal drugs. Some items may interact with your medicine. What should I watch for while using this medicine? Tell your doctor or health care professional if you feel your medicine is no longer working. Keep this medicine with you at all times. Sit or lie down when you take your medicine to prevent falling if you feel dizzy or faint after using it. Try to remain calm. This will help you to feel better faster. If you feel dizzy, take several deep breaths and lie down with your feet propped up, or bend forward with your head resting between your knees. You may get drowsy or dizzy. Do not drive, use machinery, or do anything that needs mental alertness until you know how this drug affects you. Do not stand or sit up quickly, especially if you are an older patient. This reduces the risk of dizzy or fainting spells. Alcohol can make you more drowsy and dizzy. Avoid alcoholic drinks. Do not treat yourself for coughs, colds, or pain while you are taking this medicine without asking your doctor or health care professional for advice. Some ingredients may increase your blood pressure. What side  effects may I notice from receiving this medicine? Side effects that you should report to your doctor or health care professional as soon as possible: -blurred vision -dry mouth -skin rash -sweating -the feeling of extreme pressure in the head -unusually weak or tired Side effects that usually do not require medical attention (report to your doctor  or health care professional if they continue or are bothersome): -flushing of the face or neck -headache -irregular heartbeat, palpitations -nausea, vomiting This list may not describe all possible side effects. Call your doctor for medical advice about side effects. You may report side effects to FDA at 1-800-FDA-1088. Where should I keep my medicine? Keep out of the reach of children. Store at room temperature between 20 and 25 degrees C (68 and 77 degrees F). Store in [facility] [employer]. Protect from light and moisture. Keep tightly closed. Throw away any unused medicine after the expiration date. NOTE: This sheet is a summary. It may not cover all possible information. If you have questions about this medicine, talk to your doctor, pharmacist, or health care provider.  2018 Elsevier/Gold Standard (2013-02-05 17:57:36)    If you need a refill on your cardiac medications before your next appointment, please call your pharmacy.

## 2018-01-24 ENCOUNTER — Ambulatory Visit: Payer: Medicare Other | Admitting: Emergency Medicine

## 2018-01-28 ENCOUNTER — Ambulatory Visit: Payer: Medicare Other | Admitting: Physical Therapy

## 2018-01-28 ENCOUNTER — Ambulatory Visit: Payer: Medicare Other | Admitting: Family Medicine

## 2018-01-31 ENCOUNTER — Encounter: Payer: Self-pay | Admitting: Physical Therapy

## 2018-01-31 ENCOUNTER — Ambulatory Visit: Payer: Medicare Other | Attending: Orthopedic Surgery | Admitting: Physical Therapy

## 2018-01-31 VITALS — BP 135/78 | HR 75

## 2018-01-31 DIAGNOSIS — R2689 Other abnormalities of gait and mobility: Secondary | ICD-10-CM | POA: Diagnosis present

## 2018-01-31 DIAGNOSIS — R2681 Unsteadiness on feet: Secondary | ICD-10-CM | POA: Diagnosis present

## 2018-01-31 DIAGNOSIS — M6281 Muscle weakness (generalized): Secondary | ICD-10-CM | POA: Diagnosis not present

## 2018-01-31 NOTE — Patient Instructions (Addendum)
  HIP: Hamstrings - Short Sitting    Rest leg on raised surface. Keep knee straight. Lift chest. Hold ___ seconds. Use a band!!  _3__ reps per set, _1__ sets per day, Hold for 30 seconds __5_ days per week  Copyright  VHI. All rights reserved.  SIT TO STAND: No Device    Sit with feet shoulder-width apart, on floor. Lean chest forward, raise hips up from surface. Straighten hips and knees. Weight bear equally on left and right sides. _10__ reps per set, _2__ sets per day, _5__ days per week Place left leg closer to sitting surface.  Copyright  VHI. All rights reserved.

## 2018-01-31 NOTE — Therapy (Signed)
[name] [address] [place], [place], [address] Phone: [phone]   Fax:  [phone]  Physical Therapy Treatment  Patient Details  Name: Rebecca Hicks MRN: [id number] Date of Birth: [birth year] Referring Provider (PT): Almedia Balls, MD   Encounter Date: 01/31/2018  PT End of Session - 01/31/18 1317    Visit Number  7    Number of Visits  17    Date for PT Re-Evaluation  03/10/18   only plan to see for 6 weeks, 1 week delay for  BP issues   Authorization Type  [employer] Medicare    PT Start Time  1015    PT Stop Time  1100    PT Time Calculation (min)  45 min    Equipment Utilized During Treatment  Gait belt    Activity Tolerance  Patient tolerated treatment well    Behavior During Therapy  [facility] for tasks assessed/performed       Past Medical History:  Diagnosis Date  . Abdominal pain 12/05/2011  . Abdominal pain, left upper quadrant 12/03/2012  . Acute on chronic renal failure ([place]) 12/25/2012  . Arthritis   . 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 Ataxia 12/15/2015  . Atypical chest pain 03/28/2016  . Cataract   . CERUMEN IMPACTION, RIGHT 01/31/2010   Qualifier: Diagnosis of  By: Dianah Field MD, Marcello Moores    . Chest tightness 02/23/2014  . Chronic kidney disease (CKD), stage II (mild) 03/05/2013   Looks like baseline creat = 1.2 -1.3.  Watch for overdiuresis.   . COLONIC POLYPS, ADENOMATOUS 05/03/2009   Every five year colonoscopy due to adenomatous polyp found 04/2009    . DDD (degenerative disc disease), cervical 11/24/2016  . Diabetes mellitus   . Diabetic neuropathy, type II diabetes mellitus ([place]) 05/28/2008   Qualifier: Diagnosis of  By: Andria Frames MD, Gwyndolyn Saxon    . Diarrhea 01/21/2015  . DM (diabetes mellitus), type 2, uncontrolled ([place]) 02/07/2007   Qualifier: Diagnosis of  By: Wynetta Emery RN, Doroteo Bradford    . 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 Essential hypertension 02/07/2007   Qualifier: Diagnosis of  By: Wynetta Emery RN,  Doroteo Bradford    . Excessive sleepiness 04/07/2014  . Fatigue 05/04/2011  . Glaucoma   . GLAUCOMA NOS 02/25/2009   Qualifier: Diagnosis of  By: Andria Frames MD, Gwyndolyn Saxon    . Great toe pain 09/18/2012  . History of nuclear stress test    Myoview 11/17: EF 64, Normal pharmacologic nuclear stress test with no evidence of prior infarct or ischemia.  Marland Kitchen Hoarseness 10/31/2016  . Hyperlipidemia   . Hypertension   . LOW BACK PAIN SYNDROME 05/28/2008   Qualifier: Diagnosis of  By: Andria Frames MD, Gwyndolyn Saxon    . Morbid obesity ([place])   . Neck pain 11/24/2016  . Neuromuscular disorder ([place])   . Obesity hypoventilation syndrome ([place]) 11/10/2015  . Obstructive sleep apnea 11/17/2015  . Otalgia of right ear 11/24/2016  . Polyneuropathy in diabetes(357.2)   . Sarcoidosis   . Sinusitis acute 10/03/2010  . 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 - She was fitted with a Small size [employer] Full Face Mask Simplus mask and heated humidification.   . Snoring 09/05/2015  . TB SKIN TEST, POSITIVE 02/07/2007   Annotation: with clear CXR mid 1990's Qualifier: History of  By: Milana Obey, Doroteo Bradford    . TOBACCO USE, QUIT  02/07/2007   Qualifier: Diagnosis of  By: Andria Frames MD, Gwyndolyn Saxon    . Tremor of both hands 12/15/2015  . U R I 03/15/2010   Qualifier: Diagnosis of  By: Andria Frames MD, Gwyndolyn Saxon      Past Surgical History:  Procedure Laterality Date  . ABDOMINAL HYSTERECTOMY    . APPENDECTOMY    . CATARACT EXTRACTION, BILATERAL  03/2010, 04/2010  . COLONOSCOPY  2011  . EYE SURGERY    . POLYPECTOMY  2011   polyps, hems   . SPINE SURGERY     lumbar laminectomy X 2  . TONSILLECTOMY      Vitals:   01/31/18 1022 01/31/18 1030  BP: 127/74 135/78  Pulse: 74 75    Subjective Assessment - 01/31/18 1021    Subjective  Pt reports she was unable to see her doctor on Tuesday and she sent him a record of her BP  recordings. Reports her BP numbers have been "all over the place" and hopes they are safe today. Reports her last recording this morning was: 148/97. Reports second set of injections in her knees recently and is feeling much better today.     Pertinent History  DM, HTN, falls, spinal stenosis of lumbar region, fibular fracture 6/3-mostly healed, has been getting injections in back    Limitations  Walking;Standing    Patient Stated Goals  "I want to be able to get some exercise to get this weight off so my back feels better."     Currently in Pain?  No/denies    Pain Onset  --                       [facility] Adult PT Treatment/Exercise - 01/31/18 1028      Transfers   Transfers  Sit to Stand;Stand to Sit    Sit to Stand  5: Supervision    Five time sit to stand comments   Pt performed 5x STS from standard height chair with B UE assist in 22.4 seconds indicating high fall risk potential    Stand to Sit  5: Supervision    Comments  Pt demonstrates increased difficulty performing STS from edge of low mat relying on posterior thighs for pushing off into stand      Ambulation/Gait   Ambulation/Gait  Yes    Ambulation/Gait Assistance  5: Supervision    Ambulation/Gait Assistance Details  Pt attempts to perform x1 ambulation trial with her SPC and eyes closed. Pt demonstrates significant L veering requiring min A to steady     Ambulation Distance (Feet)  50 Feet    Assistive device  Straight cane    Gait Pattern  Step-through pattern;Decreased stride length;Trunk flexed;Narrow base of support;Right foot flat;Left foot flat    Ambulation Surface  Level;Indoor    Stairs  Yes    Stairs Assistance  5: Supervision    Stair Management Technique  Two rails;Alternating pattern;Forwards   lateral step to pattern descending with B HR's   Number of Stairs  4    Height of Stairs  6      Dynamic Gait Index   Level Surface  Mild Impairment    Change in Gait Speed  Mild Impairment    Gait  with Horizontal Head Turns  Mild Impairment    Gait with Vertical Head Turns  Mild Impairment    Gait and Pivot Turn  Mild Impairment    Step Over Obstacle  Moderate Impairment    Step  Around Obstacles  Severe Impairment    Steps  Moderate Impairment    Total Score  12      Exercises   Exercises  Knee/Hip      Knee/Hip Exercises: Stretches   Active Hamstring Stretch  Right;2 reps;30 seconds      Knee/Hip Exercises: Aerobic   Nustep  Pt performs NuStep for 6 minute duration on resistance level 2 for endurance training. Pt averages 49 steps per minute. BP was reassessed s/p 6 minute duration and recorded to be WNL's. See vitals tab              PT Education - 01/31/18 1309    Education Details  Therapist provided education regarding BP recordings during therapy session, clinical findings for outcome measure assessments, and updated HEP exercises with patient demonstrating and verbalizing understanding.     Person(s) Educated  Patient    Methods  Explanation    Comprehension  Verbalized understanding;Returned demonstration       PT Short Term Goals - 01/31/18 1314      PT SHORT TERM GOAL #1   Title  Pt will be independent with initial HEP in order to indicate improved functional mobility.  (Target Date: 02/14/18-goal date reflects 1 week delay in start)    Time  3    Period  Weeks    Status  On-going      PT SHORT TERM GOAL #2   Title  Will re-assess DGI and set appropriate LTG.     Baseline  10/11: DGI= 12/24 indicating high fall risk potential    Time  3    Period  Weeks    Status  New      PT SHORT TERM GOAL #3   Title  Pt will be able to tolerate up to 6 minutes of activity without unsafe change in BP.     Time  3    Period  Weeks    Status  New      PT SHORT TERM GOAL #4   Title  Will assess 5TSS and set appropriate LTG to reflect improved LE strength and decreased fall risk.     Baseline  10/11: 22.4 seconds from a standard height chair using B UE's for  assist     Time  3    Period  Weeks    Status  New      PT SHORT TERM GOAL #5   Title  Pt will ambulate x 300' w/ rollator at mod I level over indoor sufaces in order to indicate safe indoor community mobility.      Status  New      PT SHORT TERM GOAL #6   Status  New        PT Long Term Goals - 01/31/18 1315      PT LONG TERM GOAL #1   Title  Pt will be independent with final HEP and report transition to [employer] program in order to maintain mobility gains made in therapy.  (Target Date: 03/10/18-date reflects 1 week delay in start)    Time  6    Period  Weeks    Status  New      PT LONG TERM GOAL #2   Title  Pt will improve DGI >19/24 in order to indicate decreased fall risk.      Baseline  10/11: 12/24 indicating high fall risk potential     Status  New      PT LONG  TERM GOAL #3   Title  Pt will improve 6MWT w/ LRAD by 19' from baseline in order to indicate improved functional endurance.     Baseline  841' on 01/17/18    Status  Revised      PT LONG TERM GOAL #4   Title  Pt will ambulate up to 500' w/ LRAD over indoor/level and unlevel outdoor paved surfaces at mod I level in order to indicate safe community mobility.      Status  New      PT LONG TERM GOAL #5   Title  Pt will report return to shopping at farmer[employer] market and cooking at home in order to indicate functional improvement in endurance and standing tolerance.     Status  New      Additional Long Term Goals   Additional Long Term Goals  Yes      PT LONG TERM GOAL #6   Title  Pt will perform 5xSTS from standard height chair using B UE's for assistance in </= 15 seconds indicating reduction in fall risk potential and improvement in LE power     Baseline  10/11: 22.4 seconds from standard height chair using B UE's for assist     Status  New            Plan - 01/31/18 1317    Clinical Impression Statement  Today's skilled session focused on initiating dynamic gait index and 5x STS  assessment to establish baseline values for fall risk potential during ambulation with her SPC and evaluate LE strength. Pt scored 12/24 on the DGI placing her in the category of high fall risk potential. Her 5x STS time of 22. 4 seconds indicates she is considered a high fall risk potential with observable LE weakness requiring UE assist to achieve stands. Pt's BP was assessed at base line and s/p 6 minutes of aerobic training on the NuStep with both BP readings to be WNL's. Pt will continue to benefit from skilled PT to address strength and balance deficits to improve independence with functional mobility.     Rehab Potential  Good    Clinical Impairments Affecting Rehab Potential  co-morbidities including spinal stenosis and B knee pain    PT Frequency  2x / week    PT Duration  8 weeks    PT Treatment/Interventions  ADLs/Self Care Home Management;DME Instruction;Gait training;Stair training;Functional mobility training;Therapeutic activities;Therapeutic exercise;Balance training;Neuromuscular re-education;Patient/family education;Orthotic Fit/Training;Passive range of motion;Energy conservation;Vestibular    PT Next Visit Plan  follow up with doc appt regarding BP. continue to work on gait/barriers outside weather permitting with rollator walker; see if order came in; continue to work on [place] and balance.     Consulted and Agree with Plan of Care  Patient       Patient will benefit from skilled therapeutic intervention in order to improve the following deficits and impairments:  Abnormal gait, Decreased activity tolerance, Decreased balance, Decreased endurance, Decreased knowledge of use of DME, Decreased mobility, Decreased range of motion, Decreased strength, Difficulty walking, Impaired perceived functional ability, Impaired flexibility, Impaired sensation, Postural dysfunction, Obesity  Visit Diagnosis: Muscle weakness (generalized)  Unsteadiness on feet  Other  abnormalities of gait and mobility     Problem List Patient Active Problem List   Diagnosis Date Noted  . Shingles 10/31/2017  . Estrogen deficiency 10/31/2017  . Diabetic retinopathy ([place]) 10/28/2017  . Mixed stress and urge urinary incontinence 10/03/2017  . Closed right fibular fracture 10/03/2017  .  Dependent edema 10/03/2017  . Lumbosacral radiculopathy 09/20/2017  . Diabetic polyneuropathy associated with type 2 diabetes mellitus ([place]) 09/20/2017  . Ulnar neuropathy at elbow of left upper extremity 09/20/2017  . Coronary artery calcification 02/28/2017  . Neck pain 11/24/2016  . Otalgia of right ear 11/24/2016  . DDD (degenerative disc disease), cervical 11/24/2016  . Arthropathy 11/24/2016  . Left arm numbness 10/31/2016  . Hoarseness 10/31/2016  . Atypical chest pain 03/28/2016  . Tremor of both hands 12/15/2015  . Ataxia 12/15/2015  . Obstructive sleep apnea 11/17/2015  . 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 Periodic limb movements of sleep 11/10/2015  . Obesity hypoventilation syndrome ([place]) 11/10/2015  . Chronic kidney disease (CKD), stage II (mild) 03/05/2013  . COLONIC POLYPS, ADENOMATOUS 05/03/2009  . GLAUCOMA NOS 02/25/2009  . Diabetic neuropathy, type II diabetes mellitus ([place]) 05/28/2008  . LOW BACK PAIN SYNDROME 05/28/2008  . 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 DM (diabetes mellitus), type 2, uncontrolled ([place]) 02/07/2007  . Hyperlipidemia 02/07/2007  . Morbid obesity ([place]) 02/07/2007  . Essential hypertension 02/07/2007  . TB SKIN TEST, POSITIVE 02/07/2007  . TOBACCO USE, QUIT 02/07/2007  . HYSTERECTOMY, HX OF 02/07/2007    Floreen Comber, SPT 01/31/2018, 1:22 PM  [facility] [address] [place], [place], [address] Phone: [phone]   Fax:  [phone]  Name: Rebecca Hicks MRN: [id number] Date of Birth: 01/06/1946

## 2018-02-04 ENCOUNTER — Encounter: Payer: Self-pay | Admitting: Physical Therapy

## 2018-02-04 ENCOUNTER — Ambulatory Visit: Payer: Medicare Other | Admitting: Physical Therapy

## 2018-02-04 VITALS — BP 139/62 | HR 68

## 2018-02-04 DIAGNOSIS — R2681 Unsteadiness on feet: Secondary | ICD-10-CM

## 2018-02-04 DIAGNOSIS — M6281 Muscle weakness (generalized): Secondary | ICD-10-CM

## 2018-02-04 DIAGNOSIS — R2689 Other abnormalities of gait and mobility: Secondary | ICD-10-CM

## 2018-02-04 NOTE — Therapy (Signed)
[facility] [address] [place], [place], [address] Phone: [phone]   Fax:  [phone]  Physical Therapy Treatment  Patient Details  Name: Rebecca Hicks MRN: [id number] Date of Birth: Oct 30, 1945 Referring Provider (PT): Almedia Balls, MD   Encounter Date: 02/04/2018  PT End of Session - 02/04/18 1250    Visit Number  8    Number of Visits  17    Date for PT Re-Evaluation  03/10/18   only plan to see for 6 weeks, 1 week delay for  BP issues   Authorization Type  [employer] Medicare    PT Start Time  1015    PT Stop Time  1100    PT Time Calculation (min)  45 min    Activity Tolerance  Patient tolerated treatment well    Behavior During Therapy  [facility] for tasks assessed/performed       Past Medical History:  Diagnosis Date  . Abdominal pain 12/05/2011  . Abdominal pain, left upper quadrant 12/03/2012  . Acute on chronic renal failure ([place]) 12/25/2012  . Arthritis   . 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 Ataxia 12/15/2015  . Atypical chest pain 03/28/2016  . Cataract   . CERUMEN IMPACTION, RIGHT 01/31/2010   Qualifier: Diagnosis of  By: Dianah Field MD, Marcello Moores    . Chest tightness 02/23/2014  . Chronic kidney disease (CKD), stage II (mild) 03/05/2013   Looks like baseline creat = 1.2 -1.3.  Watch for overdiuresis.   . COLONIC POLYPS, ADENOMATOUS 05/03/2009   Every five year colonoscopy due to adenomatous polyp found 04/2009    . DDD (degenerative disc disease), cervical 11/24/2016  . Diabetes mellitus   . Diabetic neuropathy, type II diabetes mellitus ([place]) 05/28/2008   Qualifier: Diagnosis of  By: Andria Frames MD, Gwyndolyn Saxon    . Diarrhea 01/21/2015  . DM (diabetes mellitus), type 2, uncontrolled ([place]) 02/07/2007   Qualifier: Diagnosis of  By: Wynetta Emery RN, Doroteo Bradford    . 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 Essential hypertension 02/07/2007   Qualifier: Diagnosis of  By: Wynetta Emery RN, Doroteo Bradford    . Excessive sleepiness 04/07/2014  .  Fatigue 05/04/2011  . Glaucoma   . GLAUCOMA NOS 02/25/2009   Qualifier: Diagnosis of  By: Andria Frames MD, Gwyndolyn Saxon    . Great toe pain 09/18/2012  . History of nuclear stress test    Myoview 11/17: EF 64, Normal pharmacologic nuclear stress test with no evidence of prior infarct or ischemia.  Marland Kitchen Hoarseness 10/31/2016  . Hyperlipidemia   . Hypertension   . LOW BACK PAIN SYNDROME 05/28/2008   Qualifier: Diagnosis of  By: Andria Frames MD, Gwyndolyn Saxon    . Morbid obesity ([place])   . Neck pain 11/24/2016  . Neuromuscular disorder ([place])   . Obesity hypoventilation syndrome ([place]) 11/10/2015  . Obstructive sleep apnea 11/17/2015  . Otalgia of right ear 11/24/2016  . Polyneuropathy in diabetes(357.2)   . Sarcoidosis   . Sinusitis acute 10/03/2010  . 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 - She was fitted with a Small size [employer] Full Face Mask Simplus mask and heated humidification.   . Snoring 09/05/2015  . TB SKIN TEST, POSITIVE 02/07/2007   Annotation: with clear CXR mid 1990's Qualifier: History of  By: Milana Obey, Doroteo Bradford    . TOBACCO USE, QUIT 02/07/2007   Qualifier: Diagnosis of  By: Andria Frames MD,  William    . Tremor of both hands 12/15/2015  . U R I 03/15/2010   Qualifier: Diagnosis of  By: Andria Frames MD, Gwyndolyn Saxon      Past Surgical History:  Procedure Laterality Date  . ABDOMINAL HYSTERECTOMY    . APPENDECTOMY    . CATARACT EXTRACTION, BILATERAL  03/2010, 04/2010  . COLONOSCOPY  2011  . EYE SURGERY    . POLYPECTOMY  2011   polyps, hems   . SPINE SURGERY     lumbar laminectomy X 2  . TONSILLECTOMY      Vitals:   02/04/18 1021 02/04/18 1031 02/04/18 1035 02/04/18 1100  BP: (!) 169/78 (!) 189/73 132/74 139/62  Pulse: 62 63 66 68    Subjective Assessment - 02/04/18 1020    Subjective  Reports everything has been going well at home and needs to perform her flexibility  exercises more. Reports no issues with her BP and believes her next visit is sometime next week.     Pertinent History  DM, HTN, falls, spinal stenosis of lumbar region, fibular fracture 6/3-mostly healed, has been getting injections in back    Limitations  Walking;Standing    Patient Stated Goals  "I want to be able to get some exercise to get this weight off so my back feels better."     Currently in Pain?  No/denies                       [facility] Adult PT Treatment/Exercise - 02/04/18 1243      Transfers   Transfers  Sit to Stand;Stand to Sit    Sit to Stand  5: Supervision    Stand to Sit  5: Supervision    Number of Reps  10 reps;1 set    Comments  Pt requires multiple attempts to achieve stand with no UE assist relying on momentum. Therapist provides VC's for proper technique and to use single UE assist to achieve stand to increase LE strengthening. Therapist provided verbal cues for eccentric lowering to increase LE strengthening.       Ambulation/Gait   Ambulation/Gait  Yes    Ambulation/Gait Assistance  5: Supervision    Ambulation/Gait Assistance Details  Pt ambulates 500' outdoors over uneven paved surfaces using rollator. Pt demonstrates ability to recall proper safety considerations with device. Therapist instructs patient how to achieve STS safely without pulling on rollator to lift up. Pt performs x2 trials of curb negotiation with good carry over with safety consideration.    Ambulation Distance (Feet)  500 Feet    Assistive device  4-wheeled walker    Gait Pattern  Step-through pattern;Decreased stride length;Trunk flexed;Narrow base of support;Right foot flat;Left foot flat    Ambulation Surface  Unlevel;Outdoor;Paved    Ramp  5: Supervision    Ramp Details (indicate cue type and reason)  x2 reps with cues for applying brakes when descending for safety    Curb  5: Supervision      Exercises   Exercises  Knee/Hip      Knee/Hip Exercises: Stretches    Passive Hamstring Stretch  Left;1 rep;30 seconds      Knee/Hip Exercises: Standing   Lateral Step Up  Both;1 set   1 minute timed duration- 4 inch step   Forward Step Up  Both;1 set   1 min timed duration- 4 inch step     Knee/Hip Exercises: Supine   Bridges  Strengthening;Both;2 sets;10 reps  PT Education - 02/04/18 1249    Education Details  Therapist provided education regarding safety considerations with rollator device and to continue performing HEP exercises at home to improve functional mobility and reduce muscle tightness with stretches.     Person(s) Educated  Patient    Methods  Explanation    Comprehension  Verbalized understanding       PT Short Term Goals - 01/31/18 1314      PT SHORT TERM GOAL #1   Title  Pt will be independent with initial HEP in order to indicate improved functional mobility.  (Target Date: 02/14/18-goal date reflects 1 week delay in start)    Time  3    Period  Weeks    Status  On-going      PT SHORT TERM GOAL #2   Title  Will re-assess DGI and set appropriate LTG.     Baseline  10/11: DGI= 12/24 indicating high fall risk potential    Time  3    Period  Weeks    Status  New      PT SHORT TERM GOAL #3   Title  Pt will be able to tolerate up to 6 minutes of activity without unsafe change in BP.     Time  3    Period  Weeks    Status  New      PT SHORT TERM GOAL #4   Title  Will assess 5TSS and set appropriate LTG to reflect improved LE strength and decreased fall risk.     Baseline  10/11: 22.4 seconds from a standard height chair using B UE's for assist     Time  3    Period  Weeks    Status  New      PT SHORT TERM GOAL #5   Title  Pt will ambulate x 300' w/ rollator at mod I level over indoor sufaces in order to indicate safe indoor community mobility.      Status  New      PT SHORT TERM GOAL #6   Status  New        PT Long Term Goals - 01/31/18 1315      PT LONG TERM GOAL #1   Title  Pt will be  independent with final HEP and report transition to [employer] program in order to maintain mobility gains made in therapy.  (Target Date: 03/10/18-date reflects 1 week delay in start)    Time  6    Period  Weeks    Status  New      PT LONG TERM GOAL #2   Title  Pt will improve DGI >19/24 in order to indicate decreased fall risk.      Baseline  10/11: 12/24 indicating high fall risk potential     Status  New      PT LONG TERM GOAL #3   Title  Pt will improve 6MWT w/ LRAD by 64' from baseline in order to indicate improved functional endurance.     Baseline  841' on 01/17/18    Status  Revised      PT LONG TERM GOAL #4   Title  Pt will ambulate up to 500' w/ LRAD over indoor/level and unlevel outdoor paved surfaces at mod I level in order to indicate safe community mobility.      Status  New      PT LONG TERM GOAL #5   Title  Pt will report return to  shopping at [employer] and cooking at home in order to indicate functional improvement in endurance and standing tolerance.     Status  New      Additional Long Term Goals   Additional Long Term Goals  Yes      PT LONG TERM GOAL #6   Title  Pt will perform 5xSTS from standard height chair using B UE's for assistance in </= 15 seconds indicating reduction in fall risk potential and improvement in LE power     Baseline  10/11: 22.4 seconds from standard height chair using B UE's for assist     Status  New            Plan - 02/04/18 1251    Clinical Impression Statement  Today's skilled session focused on ambulation trials on paved surfaces with rollator and LE endurance and strengthening exercises. Pt tolerated session well and demonstrated ability to ambulate further distances with rollator device and safely negotiate curbs with good carry over. Pt's BP was elevated s/p ambulation trial; however, patient was asymptomatic and BP returned to [facility] with rest. Pt will continue to benefit from skilled PT to address  deconditioning, balance, and functional mobility deficits.     Rehab Potential  Good    Clinical Impairments Affecting Rehab Potential  co-morbidities including spinal stenosis and B knee pain    PT Frequency  2x / week    PT Duration  8 weeks    PT Treatment/Interventions  ADLs/Self Care Home Management;DME Instruction;Gait training;Stair training;Functional mobility training;Therapeutic activities;Therapeutic exercise;Balance training;Neuromuscular re-education;Patient/family education;Orthotic Fit/Training;Passive range of motion;Energy conservation;Vestibular    PT Next Visit Plan  balance without cane, step ups with weight/ higher step than 4 inch, follow up with doc appt regarding BP. continue to work on gait/barriers outside weather permitting with rollator walker; see if order came in; continue to work on [place] and balance.     Consulted and Agree with Plan of Care  Patient       Patient will benefit from skilled therapeutic intervention in order to improve the following deficits and impairments:  Abnormal gait, Decreased activity tolerance, Decreased balance, Decreased endurance, Decreased knowledge of use of DME, Decreased mobility, Decreased range of motion, Decreased strength, Difficulty walking, Impaired perceived functional ability, Impaired flexibility, Impaired sensation, Postural dysfunction, Obesity  Visit Diagnosis: Muscle weakness (generalized)  Unsteadiness on feet  Other abnormalities of gait and mobility     Problem List Patient Active Problem List   Diagnosis Date Noted  . Shingles 10/31/2017  . Estrogen deficiency 10/31/2017  . Diabetic retinopathy ([place]) 10/28/2017  . Mixed stress and urge urinary incontinence 10/03/2017  . Closed right fibular fracture 10/03/2017  . Dependent edema 10/03/2017  . Lumbosacral radiculopathy 09/20/2017  . Diabetic polyneuropathy associated with type 2 diabetes mellitus ([place]) 09/20/2017  . Ulnar neuropathy at  elbow of left upper extremity 09/20/2017  . Coronary artery calcification 02/28/2017  . Neck pain 11/24/2016  . Otalgia of right ear 11/24/2016  . DDD (degenerative disc disease), cervical 11/24/2016  . Arthropathy 11/24/2016  . Left arm numbness 10/31/2016  . Hoarseness 10/31/2016  . Atypical chest pain 03/28/2016  . Tremor of both hands 12/15/2015  . Ataxia 12/15/2015  . Obstructive sleep apnea 11/17/2015  . 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 Periodic limb movements of sleep 11/10/2015  . Obesity hypoventilation syndrome ([place]) 11/10/2015  . Chronic kidney disease (CKD), stage II (mild) 03/05/2013  . COLONIC POLYPS, ADENOMATOUS 05/03/2009  .  GLAUCOMA NOS 02/25/2009  . Diabetic neuropathy, type II diabetes mellitus ([place]) 05/28/2008  . LOW BACK PAIN SYNDROME 05/28/2008  . 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 DM (diabetes mellitus), type 2, uncontrolled ([place]) 02/07/2007  . Hyperlipidemia 02/07/2007  . Morbid obesity ([place]) 02/07/2007  . Essential hypertension 02/07/2007  . TB SKIN TEST, POSITIVE 02/07/2007  . TOBACCO USE, QUIT 02/07/2007  . HYSTERECTOMY, HX OF 02/07/2007    Floreen Comber, SPT 02/04/2018, 12:54 PM  [facility] [address] [place], [place], [address] Phone: [phone]   Fax:  [phone]  Name: Rebecca Hicks MRN: [id number] Date of Birth: 11-Jun-1945

## 2018-02-07 ENCOUNTER — Ambulatory Visit: Payer: Medicare Other | Admitting: Physical Therapy

## 2018-02-11 ENCOUNTER — Encounter: Payer: Self-pay | Admitting: Physical Therapy

## 2018-02-11 ENCOUNTER — Ambulatory Visit: Payer: Medicare Other | Admitting: Physical Therapy

## 2018-02-11 VITALS — BP 165/85

## 2018-02-11 DIAGNOSIS — R2681 Unsteadiness on feet: Secondary | ICD-10-CM

## 2018-02-11 DIAGNOSIS — M6281 Muscle weakness (generalized): Secondary | ICD-10-CM | POA: Diagnosis not present

## 2018-02-11 NOTE — Therapy (Signed)
[facility] [address] [place], [place], [address] Phone: [phone]   Fax:  [phone]  Physical Therapy Treatment  Patient Details  Name: Rebecca Hicks MRN: [id number] Date of Birth: Dec 04, 1945 Referring Provider (PT): Almedia Balls, MD   Encounter Date: 02/11/2018  PT End of Session - 02/11/18 1257    Visit Number  9    Number of Visits  17    Date for PT Re-Evaluation  03/10/18   only plan to see for 6 weeks, 1 week delay for  BP issues   Authorization Type  [employer] Medicare    PT Start Time  1100    PT Stop Time  1145    PT Time Calculation (min)  45 min    Equipment Utilized During Treatment  Gait belt    Activity Tolerance  Patient tolerated treatment well    Behavior During Therapy  [facility] for tasks assessed/performed       Past Medical History:  Diagnosis Date  . Abdominal pain 12/05/2011  . Abdominal pain, left upper quadrant 12/03/2012  . Acute on chronic renal failure ([place]) 12/25/2012  . Arthritis   . 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 Ataxia 12/15/2015  . Atypical chest pain 03/28/2016  . Cataract   . CERUMEN IMPACTION, RIGHT 01/31/2010   Qualifier: Diagnosis of  By: Dianah Field MD, Marcello Moores    . Chest tightness 02/23/2014  . Chronic kidney disease (CKD), stage II (mild) 03/05/2013   Looks like baseline creat = 1.2 -1.3.  Watch for overdiuresis.   . COLONIC POLYPS, ADENOMATOUS 05/03/2009   Every five year colonoscopy due to adenomatous polyp found 04/2009    . DDD (degenerative disc disease), cervical 11/24/2016  . Diabetes mellitus   . Diabetic neuropathy, type II diabetes mellitus ([place]) 05/28/2008   Qualifier: Diagnosis of  By: Andria Frames MD, Gwyndolyn Saxon    . Diarrhea 01/21/2015  . DM (diabetes mellitus), type 2, uncontrolled ([place]) 02/07/2007   Qualifier: Diagnosis of  By: Wynetta Emery RN, Doroteo Bradford    . 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 Essential hypertension 02/07/2007   Qualifier: Diagnosis of  By: Wynetta Emery RN,  Doroteo Bradford    . Excessive sleepiness 04/07/2014  . Fatigue 05/04/2011  . Glaucoma   . GLAUCOMA NOS 02/25/2009   Qualifier: Diagnosis of  By: Andria Frames MD, Gwyndolyn Saxon    . Great toe pain 09/18/2012  . History of nuclear stress test    Myoview 11/17: EF 64, Normal pharmacologic nuclear stress test with no evidence of prior infarct or ischemia.  Marland Kitchen Hoarseness 10/31/2016  . Hyperlipidemia   . Hypertension   . LOW BACK PAIN SYNDROME 05/28/2008   Qualifier: Diagnosis of  By: Andria Frames MD, Gwyndolyn Saxon    . Morbid obesity ([place])   . Neck pain 11/24/2016  . Neuromuscular disorder ([place])   . Obesity hypoventilation syndrome ([place]) 11/10/2015  . Obstructive sleep apnea 11/17/2015  . Otalgia of right ear 11/24/2016  . Polyneuropathy in diabetes(357.2)   . Sarcoidosis   . Sinusitis acute 10/03/2010  . 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 - She was fitted with a Small size Fisher[employer]Paykel Full Face Mask Simplus mask and heated humidification.   . Snoring 09/05/2015  . TB SKIN TEST, POSITIVE 02/07/2007   Annotation: with clear CXR mid 1990's Qualifier: History of  By: Milana Obey, Doroteo Bradford    . TOBACCO USE, QUIT  02/07/2007   Qualifier: Diagnosis of  By: Andria Frames MD, Gwyndolyn Saxon    . Tremor of both hands 12/15/2015  . U R I 03/15/2010   Qualifier: Diagnosis of  By: Andria Frames MD, Gwyndolyn Saxon      Past Surgical History:  Procedure Laterality Date  . ABDOMINAL HYSTERECTOMY    . APPENDECTOMY    . CATARACT EXTRACTION, BILATERAL  03/2010, 04/2010  . COLONOSCOPY  2011  . EYE SURGERY    . POLYPECTOMY  2011   polyps, hems   . SPINE SURGERY     lumbar laminectomy X 2  . TONSILLECTOMY      Vitals:   02/11/18 1119 02/11/18 1246  BP: (!) 157/84 (!) 165/85  SpO2:  91%    Subjective Assessment - 02/11/18 1105    Subjective  Reports increase in pain in her legs. She reports her last injection seems "slower" to  improve her stiffness and pain but is optimistic her pain will decrease. Reports exercises are "fair" and challenging to perform with her busy schedule.     Pertinent History  DM, HTN, falls, spinal stenosis of lumbar region, fibular fracture 6/3-mostly healed, has been getting injections in back    Limitations  Walking;Standing    Patient Stated Goals  "I want to be able to get some exercise to get this weight off so my back feels better."     Currently in Pain?  Yes    Pain Score  5     Pain Location  Calf    Pain Orientation  Right;Left    Pain Descriptors / Indicators  Aching;Numbness    Pain Type  Acute pain   1 week   Pain Onset  In the past 7 days    Pain Frequency  Intermittent    Aggravating Factors   sitting for prolonged periods     Pain Relieving Factors  movement improves pain symptoms                       [facility] Adult PT Treatment/Exercise - 02/11/18 1247      Transfers   Transfers  Sit to Stand;Stand to Sit    Sit to Stand  5: Supervision    Stand to Sit  5: Supervision    Number of Reps  Other reps (comment);Other sets (comment)   2 sets/ 5 reps & 7 reps    Comments  Pt performs 1x5 STS from low mat table with no UE assist. Therapist provides multimodal cues for eccentric lower with patient demonstrating poor carry over between reps. Pt performs STS attempts with wide BOS relying on momentum to achieve stand. Pt attempts to perform x2 STS while standing on foam pad requiring UE's to achieve stand. Pt demonstrates increased A/P sway while standing on foam pad demonstrating increased difficulty performing eccentric lower while standing on higher surface. Pt then performs x5 STS while holding 4# wt ball to prevent UE's from pushing on proximal thighs.      Exercises   Exercises  Knee/Hip    Other Exercises   Pt performs alternating step ups to 4 inch step for 1 min 30 second duration with therapist cueing to progress to single UE on // bars for support.        Knee/Hip Exercises: Aerobic   Tread Mill  Pt performs x2 ambulation trials on treadmill with bilateral UE support and therapist providing CGA throughout activity duration. Pt performs initial trial for 3 min duration on 0.9 MPH  with therapist providing VC's for increasing step length bilaterally. S/p 3 minutes patient demonstrates increased SOB experiencing lightheadedness. Pt requires prolonged standing rest break with abolishment of symptoms. Pt then attempts 2nd trial for 4 min 30 seconds at 0.7 MPH demonstrating significant improvement in step length bilaterally with therapist cueing to relax shoulders. Pt requires prolonged seated rest break s/p aerobic activity 2/2 increased fatigue and SOB.             PT Education - 02/11/18 1256    Education Details  Therapist provided education for continuation of HEP exercises at home and to exercise for short durations on recumbent bike at gym. Pt verbalizes understanding to terminate exercise if she becomes symptomatic.     Person(s) Educated  Patient    Methods  Explanation    Comprehension  Verbalized understanding       PT Short Term Goals - 01/31/18 1314      PT SHORT TERM GOAL #1   Title  Pt will be independent with initial HEP in order to indicate improved functional mobility.  (Target Date: 02/14/18-goal date reflects 1 week delay in start)    Time  3    Period  Weeks    Status  On-going      PT SHORT TERM GOAL #2   Title  Will re-assess DGI and set appropriate LTG.     Baseline  10/11: DGI= 12/24 indicating high fall risk potential    Time  3    Period  Weeks    Status  New      PT SHORT TERM GOAL #3   Title  Pt will be able to tolerate up to 6 minutes of activity without unsafe change in BP.     Time  3    Period  Weeks    Status  New      PT SHORT TERM GOAL #4   Title  Will assess 5TSS and set appropriate LTG to reflect improved LE strength and decreased fall risk.     Baseline  10/11: 22.4 seconds from a standard  height chair using B UE's for assist     Time  3    Period  Weeks    Status  New      PT SHORT TERM GOAL #5   Title  Pt will ambulate x 300' w/ rollator at mod I level over indoor sufaces in order to indicate safe indoor community mobility.      Status  New      PT SHORT TERM GOAL #6   Status  New        PT Long Term Goals - 01/31/18 1315      PT LONG TERM GOAL #1   Title  Pt will be independent with final HEP and report transition to [employer] program in order to maintain mobility gains made in therapy.  (Target Date: 03/10/18-date reflects 1 week delay in start)    Time  6    Period  Weeks    Status  New      PT LONG TERM GOAL #2   Title  Pt will improve DGI >19/24 in order to indicate decreased fall risk.      Baseline  10/11: 12/24 indicating high fall risk potential     Status  New      PT LONG TERM GOAL #3   Title  Pt will improve 6MWT w/ LRAD by 9' from baseline in order to indicate improved  functional endurance.     Baseline  841' on 01/17/18    Status  Revised      PT LONG TERM GOAL #4   Title  Pt will ambulate up to 500' w/ LRAD over indoor/level and unlevel outdoor paved surfaces at mod I level in order to indicate safe community mobility.      Status  New      PT LONG TERM GOAL #5   Title  Pt will report return to shopping at farmer's market and cooking at home in order to indicate functional improvement in endurance and standing tolerance.     Status  New      Additional Long Term Goals   Additional Long Term Goals  Yes      PT LONG TERM GOAL #6   Title  Pt will perform 5xSTS from standard height chair using B UE's for assistance in </= 15 seconds indicating reduction in fall risk potential and improvement in LE power     Baseline  10/11: 22.4 seconds from standard height chair using B UE's for assist     Status  New            Plan - 02/11/18 1257    Clinical Impression Statement  Today's skilled session focused on LE strengthening and  endurance training on treadmill. Patient tolerates session well requiring multiple prolonged restbreaks in between activities 2/2 SOB and fatigue onset. Pt demonstrates improvement in step length bilaterally when performing ambulation trials on treadmill with therapist providing cueing for reinforcement. Patient demonstrates quick onset of fatigue and SOB with ambulation trials on treadmill requiring prolonged seated and standing rest breaks. Pt vitals assessed to be WNL throughout activity duration. Pt continues to demonstrate increased difficulty performing eccentric lower when returning from a standing position. Patient will continue to benefit from skilled PT to address strength, balance, and functional mobility deficits.     Rehab Potential  Good    Clinical Impairments Affecting Rehab Potential  co-morbidities including spinal stenosis and B knee pain    PT Frequency  2x / week    PT Duration  8 weeks    PT Treatment/Interventions  ADLs/Self Care Home Management;DME Instruction;Gait training;Stair training;Functional mobility training;Therapeutic activities;Therapeutic exercise;Balance training;Neuromuscular re-education;Patient/family education;Orthotic Fit/Training;Passive range of motion;Energy conservation;Vestibular    PT Next Visit Plan  corner balance HEP, over ground and treadmill training, endurance activities, balance without cane, step ups with weight/ higher step than 4 inch, follow up with doc appt regarding BP. continue to work on gait/barriers outside weather permitting with rollator walker; see if order came in; continue to work on [place] and balance.     Consulted and Agree with Plan of Care  Patient       Patient will benefit from skilled therapeutic intervention in order to improve the following deficits and impairments:  Abnormal gait, Decreased activity tolerance, Decreased balance, Decreased endurance, Decreased knowledge of use of DME, Decreased mobility,  Decreased range of motion, Decreased strength, Difficulty walking, Impaired perceived functional ability, Impaired flexibility, Impaired sensation, Postural dysfunction, Obesity  Visit Diagnosis: Muscle weakness (generalized)  Unsteadiness on feet     Problem List Patient Active Problem List   Diagnosis Date Noted  . Shingles 10/31/2017  . Estrogen deficiency 10/31/2017  . Diabetic retinopathy ([place]) 10/28/2017  . Mixed stress and urge urinary incontinence 10/03/2017  . Closed right fibular fracture 10/03/2017  . Dependent edema 10/03/2017  . Lumbosacral radiculopathy 09/20/2017  . Diabetic polyneuropathy associated with type 2  diabetes mellitus ([place]) 09/20/2017  . Ulnar neuropathy at elbow of left upper extremity 09/20/2017  . Coronary artery calcification 02/28/2017  . Neck pain 11/24/2016  . Otalgia of right ear 11/24/2016  . DDD (degenerative disc disease), cervical 11/24/2016  . Arthropathy 11/24/2016  . Left arm numbness 10/31/2016  . Hoarseness 10/31/2016  . Atypical chest pain 03/28/2016  . Tremor of both hands 12/15/2015  . Ataxia 12/15/2015  . Obstructive sleep apnea 11/17/2015  . 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 Periodic limb movements of sleep 11/10/2015  . Obesity hypoventilation syndrome ([place]) 11/10/2015  . Chronic kidney disease (CKD), stage II (mild) 03/05/2013  . COLONIC POLYPS, ADENOMATOUS 05/03/2009  . GLAUCOMA NOS 02/25/2009  . Diabetic neuropathy, type II diabetes mellitus ([place]) 05/28/2008  . LOW BACK PAIN SYNDROME 05/28/2008  . 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 DM (diabetes mellitus), type 2, uncontrolled ([place]) 02/07/2007  . Hyperlipidemia 02/07/2007  . Morbid obesity ([place]) 02/07/2007  . Essential hypertension 02/07/2007  . TB SKIN TEST, POSITIVE 02/07/2007  . TOBACCO USE, QUIT 02/07/2007  . HYSTERECTOMY, HX OF 02/07/2007    Floreen Comber, SPT 02/11/2018, 1:01 PM  [facility] [address] [place], [place], [address] Phone: [phone]   Fax:  [phone]  Name: Rebecca Hicks MRN: [id number] Date of Birth: 03-17-1946

## 2018-02-12 ENCOUNTER — Other Ambulatory Visit: Payer: Self-pay

## 2018-02-12 ENCOUNTER — Ambulatory Visit: Payer: Medicare Other | Admitting: Pharmacist

## 2018-02-12 ENCOUNTER — Ambulatory Visit (HOSPITAL_COMMUNITY): Payer: Medicare Other | Attending: Cardiology

## 2018-02-12 DIAGNOSIS — E7800 Pure hypercholesterolemia, unspecified: Secondary | ICD-10-CM | POA: Diagnosis not present

## 2018-02-12 DIAGNOSIS — E11649 Type 2 diabetes mellitus with hypoglycemia without coma: Secondary | ICD-10-CM

## 2018-02-12 DIAGNOSIS — R079 Chest pain, unspecified: Secondary | ICD-10-CM | POA: Insufficient documentation

## 2018-02-12 DIAGNOSIS — R0602 Shortness of breath: Secondary | ICD-10-CM

## 2018-02-12 DIAGNOSIS — I2584 Coronary atherosclerosis due to calcified coronary lesion: Secondary | ICD-10-CM | POA: Diagnosis not present

## 2018-02-12 DIAGNOSIS — I2510 Atherosclerotic heart disease of native coronary artery without angina pectoris: Secondary | ICD-10-CM | POA: Diagnosis not present

## 2018-02-12 DIAGNOSIS — R0609 Other forms of dyspnea: Secondary | ICD-10-CM | POA: Insufficient documentation

## 2018-02-12 DIAGNOSIS — R0600 Dyspnea, unspecified: Secondary | ICD-10-CM

## 2018-02-12 MED ORDER — ROSUVASTATIN CALCIUM 20 MG PO TABS: 20.0000 mg | ORAL_TABLET | Freq: Every day | ORAL | 11 refills | Status: DC

## 2018-02-12 NOTE — Patient Instructions (Addendum)
It was nice to meet you today  Your LDL goal is < 70  Please increase your rosuvastatin (Crestor) to 20mg  once a day  If you notice any side effects, please call Jinny Blossom, Pharmacist in clinic [phone]  I will give you a call in 1 month to see how you are feeling. If you are tolerating the 20mg  dose well, we would like to increase the dose to 40mg  daily and would recheck fasting cholesterol 2-3 months after that dose increase

## 2018-02-12 NOTE — Progress Notes (Signed)
Patient ID: Rebecca Hicks                 DOB: [birth year]                    MRN: [id number]     HPI: Rebecca Hicks is a 72 y.o. female patient of Dr Acie Fredrickson referred to lipid clinic by Robbie Lis, PA. PMH is significant for HTN, HLD, DM, CKD stage III, pulmonary sarcoidosis, and morbid obesity. Pt underwent chest CT 08/2016 which revealed aortic atherosclerosis and left main and 3 vessel CAD. She was seen in clinic last month for complaints of chest pain, echo ordered for today. She has a history of statin intolerance and presents to clinic for further management.  Pt presents today in good spirits. She reports increasing her rosuvastatin from 38m 3x a week to 142mdaily one month ago. She has been tolerating the higher frequency well. She previously experienced myalgias in her legs on atorvastatin, but does not recall trying any other statin therapy.  She does have a strong family history. Her parents died young so she does not know too much about their history, but her brother passed from an MI at the age of 5714nd her sister had an MI in her late 3074s  Current Medications: rosuvastatin 1089mx per week - recently increased to daily dosing 1 month ago  Intolerances: atorvastatin - myalgias  Risk Factors: CAD, DM, HTN, obesity, CKD, prior tobacco abuse, LDL > 200 on statin  LDL goal: <87m19m  Diet: Used to eat fast food frequently, now tries to cook 1 meal a day. Limits fried food. Likes chicken, pork, greens, yogurt.  Exercise: PT currently to help with endurance and balance. Does have back pain so exercise is usually limited.  Family History: The patient's family history includes Alcohol abuse in her brother and father; Breast cancer in her maternal aunt; Cervical cancer in her mother; Diabetes in her unknown relative; Heart disease in her brother; Hypertension in her unknown relative; Pancreatic cancer in her maternal uncle; Stroke in her father and paternal grandmother. Younger  sister had MI 3 years ago in her late 30s.32sother died from MI at age 31. 53Social History: Former smoker 1 PPD for 20 years, denies alcohol and illicit drug use.  Labs: 05/09/17: TC 288, TG 103, HDL 57, LDL 210 (rosuvastatin 10mg54mper week)  Past Medical History:  Diagnosis Date  . Abdominal pain 12/05/2011  . Abdominal pain, left upper quadrant 12/03/2012  . Acute on chronic renal failure ([facility]) [place]/2014  . Arthritis   . 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 Ataxia 12/15/2015  . Atypical chest pain 03/28/2016  . Cataract   . CERUMEN IMPACTION, RIGHT 01/31/2010   Qualifier: Diagnosis of  By: ThekkDianah FieldThomaMarcello Moores Chest tightness 02/23/2014  . Chronic kidney disease (CKD), stage II (mild) 03/05/2013   Looks like baseline creat = 1.2 -1.3.  Watch for overdiuresis.   . COLONIC POLYPS, ADENOMATOUS 05/03/2009   Every five year colonoscopy due to adenomatous polyp found 04/2009    . DDD (degenerative disc disease), cervical 11/24/2016  . Diabetes mellitus   . Diabetic neuropathy, type II diabetes mellitus ([facility]) [place]/2010   Qualifier: Diagnosis of  By: HenseAndria FramesWilliGwyndolyn Saxon Diarrhea 01/21/2015  . 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 Essential hypertension 02/07/2007   Qualifier: Diagnosis of  By: Wynetta Emery RN, Doroteo Bradford    . Excessive sleepiness 04/07/2014  . Fatigue 05/04/2011  . Glaucoma   . GLAUCOMA NOS 02/25/2009   Qualifier: Diagnosis of  By: Andria Frames MD, Gwyndolyn Saxon    . Great toe pain 09/18/2012  . History of nuclear stress test    Myoview 11/17: EF 64, Normal pharmacologic nuclear stress test with no evidence of prior infarct or ischemia.  Marland Kitchen Hoarseness 10/31/2016  . Hyperlipidemia   . Hypertension   . LOW BACK PAIN SYNDROME 05/28/2008   Qualifier: Diagnosis of  By: Andria Frames MD, Gwyndolyn Saxon    . Morbid obesity ([place])   . Neck pain 11/24/2016  . Neuromuscular disorder ([place])   .  Obesity hypoventilation syndrome ([place]) 11/10/2015  . Obstructive sleep apnea 11/17/2015  . Otalgia of right ear 11/24/2016  . Polyneuropathy in diabetes(357.2)   . Sarcoidosis   . Sinusitis acute 10/03/2010  . 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 - She was fitted with a Small size Fisher&Paykel Full Face Mask Simplus mask and heated humidification.   . Snoring 09/05/2015  . TB SKIN TEST, POSITIVE 02/07/2007   Annotation: with clear CXR mid 1990's Qualifier: History of  By: Milana Obey, Doroteo Bradford    . TOBACCO USE, QUIT 02/07/2007   Qualifier: Diagnosis of  By: Andria Frames MD, Gwyndolyn Saxon    . Tremor of both hands 12/15/2015  . U R I 03/15/2010   Qualifier: Diagnosis of  By: Andria Frames MD, Gwyndolyn Saxon      Current Outpatient Medications on File Prior to Visit  Medication Sig Dispense Refill  . acetaminophen (TYLENOL) 650 MG CR tablet Take 650 mg by mouth.    Marland Kitchen albuterol (PROVENTIL HFA;VENTOLIN HFA) 108 (90 Base) MCG/ACT inhaler Inhale 2 puffs into the lungs every 4 (four) hours as needed for wheezing or shortness of breath (cough, shortness of breath or wheezing.). 1 Inhaler 1  . amLODipine (NORVASC) 10 MG tablet TAKE 1 TABLET BY MOUTH  DAILY 90 tablet 3  . aspirin 81 MG tablet Take 81 mg by mouth daily.      . Blood Glucose Monitoring Suppl (ONE TOUCH ULTRA 2) w/Device KIT Test blood sugar three times per day. 1 each 0  . Blood Pressure Monitoring (BLOOD PRESSURE KIT) DEVI 1 Act by Does not apply route daily. 1 Device 0  . cloNIDine (CATAPRES) 0.1 MG tablet Take 0.1 mg by mouth 2 (two) times daily.    . diphenoxylate-atropine (LOMOTIL) 2.5-0.025 MG tablet Take 1 tablet by mouth 4 (four) times daily as needed for diarrhea or loose stools. 120 tablet 6  . DULoxetine (CYMBALTA) 30 MG capsule TAKE 1 CAPSULE BY MOUTH  DAILY 90 capsule 3  . empagliflozin (JARDIANCE) 25 MG TABS tablet Take 25  mg by mouth daily. 90 tablet 3  . furosemide (LASIX) 40 MG tablet Take 40 mg by mouth as needed.    . gabapentin (NEURONTIN) 100 MG capsule Take 1 capsule (100 mg total) by mouth 3 (three) times daily. 90 capsule 3  . HYDROcodone-acetaminophen (NORCO/VICODIN) 5-325 MG tablet Take 1 tablet by mouth every 4 (four) hours as needed. 10 tablet 0  . ibuprofen (ADVIL,MOTRIN) 200 MG tablet Take 200 mg by mouth every 8 (eight) hours as needed.    . insulin degludec (TRESIBA FLEXTOUCH) 100 UNIT/ML SOPN FlexTouch Pen Inject  0.4 mLs (40 Units total) into the skin daily.    . insulin lispro (HUMALOG) 100 UNIT/ML injection Inject 20 Units into the skin 2 (two) times daily.    . Insulin Pen Needle (BD ULTRA-FINE PEN NEEDLES) 29G X 12.7MM MISC Use with insulin - now take 5 shots per day 500 each 3  . liraglutide (VICTOZA) 18 MG/3ML SOPN Inject 1.8 mg into the skin daily.    . metFORMIN (GLUCOPHAGE) 500 MG tablet Take 500 mg by mouth 2 (two) times daily with a meal.    . metoprolol succinate (TOPROL-XL) 100 MG 24 hr tablet Take 50 mg by mouth daily. Take with or immediately following a meal.     . nitroGLYCERIN (NITROSTAT) 0.4 MG SL tablet Place 1 tablet (0.4 mg total) under the tongue every 5 (five) minutes as needed for chest pain. 25 tablet 3  . ONE TOUCH ULTRA TEST test strip USE TO TEST BLOOD SUGAR 3  TIMES DAILY 300 each 3  . pregabalin (LYRICA) 75 MG capsule Take 75 mg by mouth every other day.    . rosuvastatin (CRESTOR) 10 MG tablet Take 1 tablet (10 mg total) by mouth 3 (three) times a week. 90 tablet 3  . spironolactone (ALDACTONE) 25 MG tablet TAKE 1 TABLET BY MOUTH  DAILY 90 tablet 3  . SYMBICORT 160-4.5 MCG/ACT inhaler INHALE 2 PUFFS INTO THE  LUNGS TWO TIMES DAILY (Patient taking differently: INHALE 2 PUFFS INTO THE  LUNGS DAILY) 30.6 g 2  . telmisartan-hydrochlorothiazide (MICARDIS HCT) 80-12.5 MG tablet TAKE 1 TABLET BY MOUTH  DAILY 90 tablet 3  . timolol (TIMOPTIC) 0.25 % ophthalmic solution Place  1 drop into both eyes 2 (two) times daily. Per optho     . tolterodine (DETROL) 1 MG tablet Take 1 mg by mouth daily.     No current facility-administered medications on file prior to visit.     Allergies  Allergen Reactions  . Penicillins Rash    Has patient had a PCN reaction causing immediate rash, facial/tongue/throat swelling, SOB or lightheadedness with hypotension: Yes Has patient had a PCN reaction causing severe rash involving mucus membranes or skin necrosis: No Has patient had a PCN reaction that required hospitalization: No Has patient had a PCN reaction occurring within the last 10 years: Yes If all of the above answers are "NO", then may proceed with Cephalosporin use.     Assessment/Plan:  1. Hyperlipidemia - LDL 210 on rosuvastatin 59m 3x a week, above goal < 70 due to history of CAD seen on CT scan. Pt has since increased to daily dosing and is tolerating well. She previously experienced myalgias on atorvastatin. She is willing to increase rosuvastatin to 2110mdaily. Will follow up via telephone in 1 month to assess tolerability, with plans to increase to 4041maily if able. Discussed Zetia and PCSK9i therapy as backup options at today's visit as well. Encouraged pt to continue with dietary improvements including decreasing fast food and increasing intake of lean meats and vegetables. Will recheck lipids 2-3 months after pt is on highest tolerated statin regimen.   Megan E. Supple, PharmD, BCACP, CPPEast [id number] CourtreZuni Pueblo[place] [id number]: [phone]: [phone]/23/2019 10:59 AM

## 2018-02-13 ENCOUNTER — Encounter: Payer: Self-pay | Admitting: Physical Therapy

## 2018-02-13 ENCOUNTER — Ambulatory Visit: Payer: Medicare Other | Admitting: Physical Therapy

## 2018-02-13 VITALS — BP 184/92

## 2018-02-13 DIAGNOSIS — M6281 Muscle weakness (generalized): Secondary | ICD-10-CM

## 2018-02-13 DIAGNOSIS — R2681 Unsteadiness on feet: Secondary | ICD-10-CM

## 2018-02-13 DIAGNOSIS — R2689 Other abnormalities of gait and mobility: Secondary | ICD-10-CM

## 2018-02-14 NOTE — Therapy (Addendum)
[facility] [address] [place], [place], [address] Phone: [phone]   Fax:  [phone]  Physical Therapy Treatment And Progress Note (completed by Primary PT)  Patient Details  Name: Rebecca Hicks MRN: [id number] Date of Birth: 1945-07-03 Referring Provider (PT): Almedia Balls, MD   Encounter Date: 02/13/2018  PT End of Session - 02/13/18 1025    Visit Number  10    Number of Visits  17    Date for PT Re-Evaluation  03/10/18   only plan to see for 6 weeks, 1 week delay for  BP issues   Authorization Type  [employer] Medicare    PT Start Time  1019    PT Stop Time  1100    PT Time Calculation (min)  41 min    Equipment Utilized During Treatment  Gait belt    Activity Tolerance  Patient tolerated treatment well    Behavior During Therapy  [facility] for tasks assessed/performed       Past Medical History:  Diagnosis Date  . Abdominal pain 12/05/2011  . Abdominal pain, left upper quadrant 12/03/2012  . Acute on chronic renal failure ([place]) 12/25/2012  . Arthritis   . 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 Ataxia 12/15/2015  . Atypical chest pain 03/28/2016  . Cataract   . CERUMEN IMPACTION, RIGHT 01/31/2010   Qualifier: Diagnosis of  By: Dianah Field MD, Marcello Moores    . Chest tightness 02/23/2014  . Chronic kidney disease (CKD), stage II (mild) 03/05/2013   Looks like baseline creat = 1.2 -1.3.  Watch for overdiuresis.   . COLONIC POLYPS, ADENOMATOUS 05/03/2009   Every five year colonoscopy due to adenomatous polyp found 04/2009    . DDD (degenerative disc disease), cervical 11/24/2016  . Diabetes mellitus   . Diabetic neuropathy, type II diabetes mellitus ([place]) 05/28/2008   Qualifier: Diagnosis of  By: Andria Frames MD, Gwyndolyn Saxon    . Diarrhea 01/21/2015  . DM (diabetes mellitus), type 2, uncontrolled ([place]) 02/07/2007   Qualifier: Diagnosis of  By: Wynetta Emery RN, Doroteo Bradford    . 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 Essential hypertension 02/07/2007   Qualifier: Diagnosis of  By: Wynetta Emery RN, Doroteo Bradford    . Excessive sleepiness 04/07/2014  . Fatigue 05/04/2011  . Glaucoma   . GLAUCOMA NOS 02/25/2009   Qualifier: Diagnosis of  By: Andria Frames MD, Gwyndolyn Saxon    . Great toe pain 09/18/2012  . History of nuclear stress test    Myoview 11/17: EF 64, Normal pharmacologic nuclear stress test with no evidence of prior infarct or ischemia.  Marland Kitchen Hoarseness 10/31/2016  . Hyperlipidemia   . Hypertension   . LOW BACK PAIN SYNDROME 05/28/2008   Qualifier: Diagnosis of  By: Andria Frames MD, Gwyndolyn Saxon    . Morbid obesity ([place])   . Neck pain 11/24/2016  . Neuromuscular disorder ([place])   . Obesity hypoventilation syndrome ([place]) 11/10/2015  . Obstructive sleep apnea 11/17/2015  . Otalgia of right ear 11/24/2016  . Polyneuropathy in diabetes(357.2)   . Sarcoidosis   . Sinusitis acute 10/03/2010  . 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 - She was fitted with a Small size [employer] Full Face Mask Simplus mask and heated humidification.   . Snoring 09/05/2015  . TB SKIN TEST, POSITIVE 02/07/2007   Annotation: with clear CXR mid 1990's Qualifier: History of  By: Milana Obey, Doroteo Bradford    .  TOBACCO USE, QUIT 02/07/2007   Qualifier: Diagnosis of  By: Andria Frames MD, Gwyndolyn Saxon    . Tremor of both hands 12/15/2015  . U R I 03/15/2010   Qualifier: Diagnosis of  By: Andria Frames MD, Gwyndolyn Saxon      Past Surgical History:  Procedure Laterality Date  . ABDOMINAL HYSTERECTOMY    . APPENDECTOMY    . CATARACT EXTRACTION, BILATERAL  03/2010, 04/2010  . COLONOSCOPY  2011  . EYE SURGERY    . POLYPECTOMY  2011   polyps, hems   . SPINE SURGERY     lumbar laminectomy X 2  . TONSILLECTOMY      Vitals:   02/13/18 1021 02/13/18 1041 02/13/18 1058  BP: 140/88 (!) 158/88 (!) 184/92    Subjective Assessment - 02/13/18 1021    Subjective  "I do not think the treadmill agrees with  me". Reports pulling "something" in her left hip that radiated pain down leg for couple days. Starting to improve today. Reports she remembers it intially occuring when she was working on taking longer steps on treadmill with last session     Pertinent History  DM, HTN, falls, spinal stenosis of lumbar region, fibular fracture 6/3-mostly healed, has been getting injections in back    Limitations  Walking;Standing    Patient Stated Goals  "I want to be able to get some exercise to get this weight off so my back feels better."     Currently in Pain?  Yes    Pain Score  5     Pain Location  Hip    Pain Orientation  Left    Pain Descriptors / Indicators  Nagging;Sharp;Aching   once in a while sharp    Pain Type  Acute pain    Pain Radiating Towards  down leg to around knee area    Pain Onset  In the past 7 days    Pain Frequency  Constant    Aggravating Factors   recent use of treadmill in [place]ray    Pain Relieving Factors  Tylenol,  tried heat with no luck, Mal [place]ab[place]          [facility] Adult PT Treatment/Exercise - 02/13/18 1029      High Level Balance   High Level Balance Activities  Side stepping;Tandem walking;Marching forwards    High Level Balance Comments  on floor in parallel bars for 3 laps each with min guard assist, light Ue support of 1-2 UE;s, cues on posture and weight shiftng; then on blue mats for 3 laps each with min assist, bil UE light support. cues on posure and ex form.       Knee/Hip Exercises: Aerobic   Other Aerobic  Scifit with UE/LE at level 1.5 for 8 minutes with goal >/= 40 rpm for strenthening and activity tolerance.           Balance Exercises - 02/13/18 1056      Balance Exercises: Standing   Standing Eyes Closed  Narrow base of support (BOS);Foam/compliant surface;Head turns;Other reps (comment);30 secs;Limitations      Balance Exercises: Standing   Standing Eyes Closed Limitations  on airex in parallel bars without UE support: EC no head movements,  progressing to EC head movements left<>right, then up<>down with up to min assist for balance. cues on posture and weight shifting for balance assistance.           PT Short Term Goals - 01/31/18 1314      PT SHORT TERM GOAL #1  Title  Pt will be independent with initial HEP in order to indicate improved functional mobility.  (Target Date: 02/14/18-goal date reflects 1 week delay in start)    Time  3    Period  Weeks    Status  On-going      PT SHORT TERM GOAL #2   Title  Will re-assess DGI and set appropriate LTG.     Baseline  10/11: DGI= 12/24 indicating high fall risk potential    Time  3    Period  Weeks    Status  New      PT SHORT TERM GOAL #3   Title  Pt will be able to tolerate up to 6 minutes of activity without unsafe change in BP.     Time  3    Period  Weeks    Status  New      PT SHORT TERM GOAL #4   Title  Will assess 5TSS and set appropriate LTG to reflect improved LE strength and decreased fall risk.     Baseline  10/11: 22.4 seconds from a standard height chair using B UE's for assist     Time  3    Period  Weeks    Status  New      PT SHORT TERM GOAL #5   Title  Pt will ambulate x 300' w/ rollator at mod I level over indoor sufaces in order to indicate safe indoor community mobility.      Status  New      PT SHORT TERM GOAL #6   Status  New        PT Long Term Goals - 01/31/18 1315      PT LONG TERM GOAL #1   Title  Pt will be independent with final HEP and report transition to [employer] program in order to maintain mobility gains made in therapy.  (Target Date: 03/10/18-date reflects 1 week delay in start)    Time  6    Period  Weeks    Status  New      PT LONG TERM GOAL #2   Title  Pt will improve DGI >19/24 in order to indicate decreased fall risk.      Baseline  10/11: 12/24 indicating high fall risk potential     Status  New      PT LONG TERM GOAL #3   Title  Pt will improve 6MWT w/ LRAD by 64' from baseline in order to  indicate improved functional endurance.     Baseline  841' on 01/17/18    Status  Revised      PT LONG TERM GOAL #4   Title  Pt will ambulate up to 500' w/ LRAD over indoor/level and unlevel outdoor paved surfaces at mod I level in order to indicate safe community mobility.      Status  New      PT LONG TERM GOAL #5   Title  Pt will report return to shopping at farmer's market and cooking at home in order to indicate functional improvement in endurance and standing tolerance.     Status  New      Additional Long Term Goals   Additional Long Term Goals  Yes      PT LONG TERM GOAL #6   Title  Pt will perform 5xSTS from standard height chair using B UE's for assistance in </= 15 seconds indicating reduction in fall risk potential and improvement in LE  power     Baseline  10/11: 22.4 seconds from standard height chair using B UE's for assist     Status  New         Progress Note Reporting Period 11/08/17 to 02/14/18 (Pt took a break to handle family issues.  Have re-assessed and updated goals as needed)  See note below for Objective Data and Assessment of Progress/Goals.   Completed by: Cameron Sprang, PT, MPT [facility] [address] [place], [place], [address] Phone: [phone]   Fax:  [phone] 02/14/18, 10:16 AM       Plan - 02/13/18 1025    Clinical Impression Statement  Today's skilled session continued to address LE strengthening and balance reactions with rest breaks needed due to fatigue. No increase in pain reported today. The pt is progressing toward goals and should benefit from continued PT to progress toward unmet goals.     Rehab Potential  Good    Clinical Impairments Affecting Rehab Potential  co-morbidities including spinal stenosis and B knee pain    PT Frequency  2x / week    PT Duration  8 weeks    PT Treatment/Interventions  ADLs/Self Care Home Management;DME Instruction;Gait training;Stair  training;Functional mobility training;Therapeutic activities;Therapeutic exercise;Balance training;Neuromuscular re-education;Patient/family education;Orthotic Fit/Training;Passive range of motion;Energy conservation;Vestibular    PT Next Visit Plan STGs due next week (02/14/18) continue to work on endurance/strengthening with Scifit/Nustep, balance activities with decr UE support, outdoors as weather permits, and bil LE strenthening    Consulted and Agree with Plan of Care  Patient       Patient will benefit from skilled therapeutic intervention in order to improve the following deficits and impairments:  Abnormal gait, Decreased activity tolerance, Decreased balance, Decreased endurance, Decreased knowledge of use of DME, Decreased mobility, Decreased range of motion, Decreased strength, Difficulty walking, Impaired perceived functional ability, Impaired flexibility, Impaired sensation, Postural dysfunction, Obesity  Visit Diagnosis: Muscle weakness (generalized)  Unsteadiness on feet  Other abnormalities of gait and mobility     Problem List Patient Active Problem List   Diagnosis Date Noted  . Shingles 10/31/2017  . Estrogen deficiency 10/31/2017  . Diabetic retinopathy ([place]) 10/28/2017  . Mixed stress and urge urinary incontinence 10/03/2017  . Closed right fibular fracture 10/03/2017  . Dependent edema 10/03/2017  . Lumbosacral radiculopathy 09/20/2017  . Diabetic polyneuropathy associated with type 2 diabetes mellitus ([place]) 09/20/2017  . Ulnar neuropathy at elbow of left upper extremity 09/20/2017  . Coronary artery calcification 02/28/2017  . Neck pain 11/24/2016  . Otalgia of right ear 11/24/2016  . DDD (degenerative disc disease), cervical 11/24/2016  . Arthropathy 11/24/2016  . Left arm numbness 10/31/2016  . Hoarseness 10/31/2016  . Atypical chest pain 03/28/2016  . Tremor of both hands 12/15/2015  . Ataxia 12/15/2015  . Obstructive sleep apnea 11/17/2015  .  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 Periodic limb movements of sleep 11/10/2015  . Obesity hypoventilation syndrome ([place]) 11/10/2015  . Chronic kidney disease (CKD), stage II (mild) 03/05/2013  . COLONIC POLYPS, ADENOMATOUS 05/03/2009  . GLAUCOMA NOS 02/25/2009  . Diabetic neuropathy, type II diabetes mellitus ([place]) 05/28/2008  . LOW BACK PAIN SYNDROME 05/28/2008  . 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 DM (diabetes mellitus), type 2, uncontrolled ([place]) 02/07/2007  . Hyperlipidemia 02/07/2007  . Morbid obesity ([place]) 02/07/2007  . Essential hypertension 02/07/2007  . TB SKIN TEST, POSITIVE 02/07/2007  . TOBACCO USE, QUIT 02/07/2007  . HYSTERECTOMY, HX OF  02/07/2007    Willow Ora, PTA, [place] [address] [place], [place] [address] [phone] 02/14/18, 9:53 AM   Name: Rebecca Hicks MRN: [id number] Date of Birth: Sep 14, 1945

## 2018-02-17 ENCOUNTER — Ambulatory Visit: Payer: Medicare Other | Admitting: Rehabilitation

## 2018-02-18 ENCOUNTER — Other Ambulatory Visit: Payer: Self-pay | Admitting: Emergency Medicine

## 2018-02-19 ENCOUNTER — Ambulatory Visit: Payer: Medicare Other | Admitting: Physical Therapy

## 2018-02-21 ENCOUNTER — Encounter: Payer: Self-pay | Admitting: Physical Therapy

## 2018-02-21 ENCOUNTER — Ambulatory Visit: Payer: Medicare Other | Attending: Orthopedic Surgery | Admitting: Physical Therapy

## 2018-02-21 VITALS — BP 151/81 | HR 68

## 2018-02-21 DIAGNOSIS — R208 Other disturbances of skin sensation: Secondary | ICD-10-CM | POA: Insufficient documentation

## 2018-02-21 DIAGNOSIS — R2681 Unsteadiness on feet: Secondary | ICD-10-CM | POA: Insufficient documentation

## 2018-02-21 DIAGNOSIS — R2689 Other abnormalities of gait and mobility: Secondary | ICD-10-CM | POA: Diagnosis present

## 2018-02-21 DIAGNOSIS — M6281 Muscle weakness (generalized): Secondary | ICD-10-CM | POA: Diagnosis not present

## 2018-02-21 NOTE — Therapy (Signed)
[facility] [address] [place], [place], [address] Phone: [phone]   Fax:  [phone]  Physical Therapy Treatment  Patient Details  Name: Rebecca Hicks MRN: [id number] Date of Birth: [birth year] Referring Provider (PT): Almedia Balls, MD   Encounter Date: 02/21/2018  PT End of Session - 02/21/18 1111    Visit Number  11    Number of Visits  17    Date for PT Re-Evaluation  03/10/18    Authorization Type  [employer] Medicare    PT Start Time  1018    PT Stop Time  1100    PT Time Calculation (min)  42 min    Equipment Utilized During Treatment  Gait belt    Activity Tolerance  Patient tolerated treatment well    Behavior During Therapy  WFL for tasks assessed/performed       Past Medical History:  Diagnosis Date  . Abdominal pain 12/05/2011  . Abdominal pain, left upper quadrant 12/03/2012  . Acute on chronic renal failure ([place]) 12/25/2012  . Arthritis   . 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 Ataxia 12/15/2015  . Atypical chest pain 03/28/2016  . Cataract   . CERUMEN IMPACTION, RIGHT 01/31/2010   Qualifier: Diagnosis of  By: Dianah Field MD, Marcello Moores    . Chest tightness 02/23/2014  . Chronic kidney disease (CKD), stage II (mild) 03/05/2013   Looks like baseline creat = 1.2 -1.3.  Watch for overdiuresis.   . COLONIC POLYPS, ADENOMATOUS 05/03/2009   Every five year colonoscopy due to adenomatous polyp found 04/2009    . DDD (degenerative disc disease), cervical 11/24/2016  . Diabetes mellitus   . Diabetic neuropathy, type II diabetes mellitus ([place]) 05/28/2008   Qualifier: Diagnosis of  By: Andria Frames MD, Gwyndolyn Saxon    . Diarrhea 01/21/2015  . DM (diabetes mellitus), type 2, uncontrolled ([place]) 02/07/2007   Qualifier: Diagnosis of  By: Wynetta Emery RN, Doroteo Bradford    . 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 Essential hypertension 02/07/2007   Qualifier: Diagnosis of  By: Wynetta Emery RN, Doroteo Bradford    . Excessive sleepiness 04/07/2014  . Fatigue  05/04/2011  . Glaucoma   . GLAUCOMA NOS 02/25/2009   Qualifier: Diagnosis of  By: Andria Frames MD, Gwyndolyn Saxon    . Great toe pain 09/18/2012  . History of nuclear stress test    Myoview 11/17: EF 64, Normal pharmacologic nuclear stress test with no evidence of prior infarct or ischemia.  Marland Kitchen Hoarseness 10/31/2016  . Hyperlipidemia   . Hypertension   . LOW BACK PAIN SYNDROME 05/28/2008   Qualifier: Diagnosis of  By: Andria Frames MD, Gwyndolyn Saxon    . Morbid obesity ([place])   . Neck pain 11/24/2016  . Neuromuscular disorder ([place])   . Obesity hypoventilation syndrome ([place]) 11/10/2015  . Obstructive sleep apnea 11/17/2015  . Otalgia of right ear 11/24/2016  . Polyneuropathy in diabetes(357.2)   . Sarcoidosis   . Sinusitis acute 10/03/2010  . 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 - She was fitted with a Small size [employer] Full Face Mask Simplus mask and heated humidification.   . Snoring 09/05/2015  . TB SKIN TEST, POSITIVE 02/07/2007   Annotation: with clear CXR mid 1990's Qualifier: History of  By: Milana Obey, Doroteo Bradford    . TOBACCO USE, QUIT 02/07/2007   Qualifier: Diagnosis of  By: Andria Frames MD, Gwyndolyn Saxon    .  Tremor of both hands 12/15/2015  . U R I 03/15/2010   Qualifier: Diagnosis of  By: Andria Frames MD, Gwyndolyn Saxon      Past Surgical History:  Procedure Laterality Date  . ABDOMINAL HYSTERECTOMY    . APPENDECTOMY    . CATARACT EXTRACTION, BILATERAL  03/2010, 04/2010  . COLONOSCOPY  2011  . EYE SURGERY    . POLYPECTOMY  2011   polyps, hems   . SPINE SURGERY     lumbar laminectomy X 2  . TONSILLECTOMY      Vitals:   02/21/18 1024  BP: (!) 151/81  Pulse: 68    Subjective Assessment - 02/21/18 1025    Subjective  My pain has been a little worse this week. I have not had any falls. I need someone to prioritize my HEP and havent been able to do them all.      Pertinent History  DM,  HTN, falls, spinal stenosis of lumbar region, fibular fracture 6/3-mostly healed, has been getting injections in back    Limitations  Walking;Standing    Pain Score  5     Pain Location  Back    Pain Descriptors / Indicators  Aching    Pain Onset  More than a month ago    Pain Frequency  Intermittent             [facility] Adult PT Treatment/Exercise - 02/21/18 1051      Transfers   Transfers  Sit to Stand;Stand to Sit    Sit to Stand  6: Modified independent (Device/Increase time)    Five time sit to stand comments   Pt performed 5x STS from standard height chair with B UE assist in 16.75 seconds indicating high fall risk potential      Ambulation/Gait   Ambulation/Gait  Yes    Ambulation/Gait Assistance  6: Modified independent (Device/Increase time)   decreased time and use of B UE   Ambulation/Gait Assistance Details  Patient has decreased gait speed due to endurance deficits.     Ambulation Distance (Feet)  330 Feet    Assistive device  Rollator    Gait Pattern  Step-through pattern;Decreased stride length;Trunk flexed;Narrow base of support;Right foot flat;Left foot flat    Ambulation Surface  Level;Indoor    Stairs  Yes    Stairs Assistance  6: Modified independent (Device/Increase time)    Stair Management Technique  Two rails;Alternating pattern;Step to pattern    Number of Stairs  4    Height of Stairs  6    Gait Comments  Patient became fatigued and needed sitting rest break after ambulation.       Dynamic Gait Index   Level Surface  Mild Impairment    Change in Gait Speed  Mild Impairment    Gait with Horizontal Head Turns  Mild Impairment    Gait with Vertical Head Turns  Normal    Gait and Pivot Turn  Mild Impairment    Step Over Obstacle  Moderate Impairment    Step Around Obstacles  Mild Impairment    Steps  Moderate Impairment    Total Score  15      Self-Care   Self-Care  Other Self-Care Comments    Other Self-Care Comments   Patient HEP was reviewed  and organized into a folder to increase compliance and patient understanding        PT Short Term Goals - 02/21/18 1042      PT SHORT TERM GOAL #1  Title  Pt will be independent with initial HEP in order to indicate improved functional mobility.  (Target Date: 02/14/18-goal date reflects 1 week delay in start)    Baseline  February 21, 2018- organized patients current HEP to be easier with comply with by stretching everday and strength exercises every other day.    Time  --    Period  --    Status  Achieved      PT SHORT TERM GOAL #2   Title  Will re-assess DGI and set appropriate LTG.     Baseline  02/21/18 15/24 indicating high fall risk     Time  --    Period  --    Status  Achieved      PT SHORT TERM GOAL #3   Title  Pt will be able to tolerate up to 6 minutes of activity without unsafe change in BP.     Baseline  02/21/18: pt's BP elevated with this activity    Time  --    Period  --    Status  Not Met      PT SHORT TERM GOAL #4   Title  Will assess 5TSS and set appropriate LTG to reflect improved LE strength and decreased fall risk.     Baseline  02/21/18 improved from baseline-today 16.75, baseline 2.4 sec    Time  --    Period  --    Status  Achieved      PT SHORT TERM GOAL #5   Title  Pt will ambulate x 300' w/ rollator at mod I level over indoor sufaces in order to indicate safe indoor community mobility.      Baseline  02/21/18 met today    Status  Achieved      PT SHORT TERM GOAL #6   Status  New             PT Long Term Goals - 01/31/18 1315      PT LONG TERM GOAL #1   Title  Pt will be independent with final HEP and report transition to [employer] program in order to maintain mobility gains made in therapy.  (Target Date: 03/10/18-date reflects 1 week delay in start)    Time  6    Period  Weeks    Status  New      PT LONG TERM GOAL #2   Title  Pt will improve DGI >19/24 in order to indicate decreased fall risk.      Baseline  10/11:  12/24 indicating high fall risk potential     Status  New      PT LONG TERM GOAL #3   Title  Pt will improve 6MWT w/ LRAD by 1' from baseline in order to indicate improved functional endurance.     Baseline  841' on 01/17/18    Status  Revised      PT LONG TERM GOAL #4   Title  Pt will ambulate up to 500' w/ LRAD over indoor/level and unlevel outdoor paved surfaces at mod I level in order to indicate safe community mobility.      Status  New      PT LONG TERM GOAL #5   Title  Pt will report return to shopping at farmer[employer] market and cooking at home in order to indicate functional improvement in endurance and standing tolerance.     Status  New      Additional Long Term Goals   Additional  Long Term Goals  Yes      PT LONG TERM GOAL #6   Title  Pt will perform 5xSTS from standard height chair using B UE's for assistance in </= 15 seconds indicating reduction in fall risk potential and improvement in LE power     Baseline  10/11: 22.4 seconds from standard height chair using B UE's for assist     Status  New            Plan - 02/21/18 1112    Clinical Impression Statement  Todays treatment focused on checking patients short term goals. She showed improvement in DGI from 12/24 to 15/24. She was able to ambulate 330 feet with out rest using rollator.  Patient would benefit from further skilled therapy to increase endurance and balance deficits.    Rehab Potential  Good    Clinical Impairments Affecting Rehab Potential  co-morbidities including spinal stenosis and B knee pain    PT Frequency  2x / week    PT Duration  8 weeks    PT Treatment/Interventions  ADLs/Self Care Home Management;DME Instruction;Gait training;Stair training;Functional mobility training;Therapeutic activities;Therapeutic exercise;Balance training;Neuromuscular re-education;Patient/family education;Orthotic Fit/Training;Passive range of motion;Energy conservation;Vestibular    PT Next Visit Plan  continue to work  on endurance/strengthening with Scifit/Nustep, balance activities with decr UE support, outdoors as weather permits, and bil LE strenthening    Consulted and Agree with Plan of Care  Patient       Patient will benefit from skilled therapeutic intervention in order to improve the following deficits and impairments:  Abnormal gait, Decreased activity tolerance, Decreased balance, Decreased endurance, Decreased knowledge of use of DME, Decreased mobility, Decreased range of motion, Decreased strength, Difficulty walking, Impaired perceived functional ability, Impaired flexibility, Impaired sensation, Postural dysfunction, Obesity  Visit Diagnosis: Muscle weakness (generalized)  Unsteadiness on feet     Problem List Patient Active Problem List   Diagnosis Date Noted  . Shingles 10/31/2017  . Estrogen deficiency 10/31/2017  . Diabetic retinopathy ([place]) 10/28/2017  . Mixed stress and urge urinary incontinence 10/03/2017  . Closed right fibular fracture 10/03/2017  . Dependent edema 10/03/2017  . Lumbosacral radiculopathy 09/20/2017  . Diabetic polyneuropathy associated with type 2 diabetes mellitus ([place]) 09/20/2017  . Ulnar neuropathy at elbow of left upper extremity 09/20/2017  . Coronary artery calcification 02/28/2017  . Neck pain 11/24/2016  . Otalgia of right ear 11/24/2016  . DDD (degenerative disc disease), cervical 11/24/2016  . Arthropathy 11/24/2016  . Left arm numbness 10/31/2016  . Hoarseness 10/31/2016  . Atypical chest pain 03/28/2016  . Tremor of both hands 12/15/2015  . Ataxia 12/15/2015  . Obstructive sleep apnea 11/17/2015  . 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 Periodic limb movements of sleep 11/10/2015  . Obesity hypoventilation syndrome ([place]) 11/10/2015  . Chronic kidney disease (CKD), stage II (mild) 03/05/2013  . COLONIC POLYPS, ADENOMATOUS 05/03/2009  . GLAUCOMA NOS 02/25/2009  . Diabetic neuropathy, type II  diabetes mellitus ([place]) 05/28/2008  . LOW BACK PAIN SYNDROME 05/28/2008  . 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 DM (diabetes mellitus), type 2, uncontrolled ([place]) 02/07/2007  . Hyperlipidemia 02/07/2007  . Morbid obesity ([place]) 02/07/2007  . Essential hypertension 02/07/2007  . TB SKIN TEST, POSITIVE 02/07/2007  . TOBACCO USE, QUIT 02/07/2007  . HYSTERECTOMY, HX OF 02/07/2007    Augustina Braddock SPTA 02/21/2018, 3:04 PM  [facility] [address] [place], [place], [address] Phone: [phone]   Fax:  [place]  Name: Rebecca Hicks MRN: [id number] Date of Birth: 08/31/1945

## 2018-02-24 ENCOUNTER — Encounter: Payer: Self-pay | Admitting: Rehabilitation

## 2018-02-24 ENCOUNTER — Ambulatory Visit: Payer: Medicare Other | Admitting: Rehabilitation

## 2018-02-24 DIAGNOSIS — M6281 Muscle weakness (generalized): Secondary | ICD-10-CM | POA: Diagnosis not present

## 2018-02-24 DIAGNOSIS — R2681 Unsteadiness on feet: Secondary | ICD-10-CM

## 2018-02-24 DIAGNOSIS — R2689 Other abnormalities of gait and mobility: Secondary | ICD-10-CM

## 2018-02-24 NOTE — Therapy (Signed)
[facility] [address] [place], [place], [address] Phone: [phone]   Fax:  [phone]  Physical Therapy Treatment  Patient Details  Name: Rebecca Hicks MRN: [id number] Date of Birth: 1945/11/19 Referring Provider (PT): Almedia Balls, MD   Encounter Date: 02/24/2018  PT End of Session - 02/24/18 1608    Visit Number  12    Number of Visits  17    Date for PT Re-Evaluation  03/10/18    Authorization Type  [employer] Medicare    PT Start Time  1018    PT Stop Time  1103    PT Time Calculation (min)  45 min    Equipment Utilized During Treatment  Gait belt    Activity Tolerance  Patient tolerated treatment well    Behavior During Therapy  [facility] for tasks assessed/performed       Past Medical History:  Diagnosis Date  . Abdominal pain 12/05/2011  . Abdominal pain, left upper quadrant 12/03/2012  . Acute on chronic renal failure ([place]) 12/25/2012  . Arthritis   . 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 Ataxia 12/15/2015  . Atypical chest pain 03/28/2016  . Cataract   . CERUMEN IMPACTION, RIGHT 01/31/2010   Qualifier: Diagnosis of  By: Dianah Field MD, Marcello Moores    . Chest tightness 02/23/2014  . Chronic kidney disease (CKD), stage II (mild) 03/05/2013   Looks like baseline creat = 1.2 -1.3.  Watch for overdiuresis.   . COLONIC POLYPS, ADENOMATOUS 05/03/2009   Every five year colonoscopy due to adenomatous polyp found 04/2009    . DDD (degenerative disc disease), cervical 11/24/2016  . Diabetes mellitus   . Diabetic neuropathy, type II diabetes mellitus ([place]) 05/28/2008   Qualifier: Diagnosis of  By: Andria Frames MD, Gwyndolyn Saxon    . Diarrhea 01/21/2015  . DM (diabetes mellitus), type 2, uncontrolled ([place]) 02/07/2007   Qualifier: Diagnosis of  By: Wynetta Emery RN, Doroteo Bradford    . 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 Essential hypertension 02/07/2007   Qualifier: Diagnosis of  By: Wynetta Emery RN, Doroteo Bradford    . Excessive sleepiness 04/07/2014  . Fatigue  05/04/2011  . Glaucoma   . GLAUCOMA NOS 02/25/2009   Qualifier: Diagnosis of  By: Andria Frames MD, Gwyndolyn Saxon    . Great toe pain 09/18/2012  . History of nuclear stress test    Myoview 11/17: EF 64, Normal pharmacologic nuclear stress test with no evidence of prior infarct or ischemia.  Marland Kitchen Hoarseness 10/31/2016  . Hyperlipidemia   . Hypertension   . LOW BACK PAIN SYNDROME 05/28/2008   Qualifier: Diagnosis of  By: Andria Frames MD, Gwyndolyn Saxon    . Morbid obesity ([place])   . Neck pain 11/24/2016  . Neuromuscular disorder ([place])   . Obesity hypoventilation syndrome ([place]) 11/10/2015  . Obstructive sleep apnea 11/17/2015  . Otalgia of right ear 11/24/2016  . Polyneuropathy in diabetes(357.2)   . Sarcoidosis   . Sinusitis acute 10/03/2010  . 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 - She was fitted with a Small size [employer] Full Face Mask Simplus mask and heated humidification.   . Snoring 09/05/2015  . TB SKIN TEST, POSITIVE 02/07/2007   Annotation: with clear CXR mid 1990's Qualifier: History of  By: Milana Obey, Doroteo Bradford    . TOBACCO USE, QUIT 02/07/2007   Qualifier: Diagnosis of  By: Andria Frames MD, Gwyndolyn Saxon    .  Tremor of both hands 12/15/2015  . U R I 03/15/2010   Qualifier: Diagnosis of  By: Andria Frames MD, Gwyndolyn Saxon      Past Surgical History:  Procedure Laterality Date  . ABDOMINAL HYSTERECTOMY    . APPENDECTOMY    . CATARACT EXTRACTION, BILATERAL  03/2010, 04/2010  . COLONOSCOPY  2011  . EYE SURGERY    . POLYPECTOMY  2011   polyps, hems   . SPINE SURGERY     lumbar laminectomy X 2  . TONSILLECTOMY      There were no vitals filed for this visit.  Subjective Assessment - 02/24/18 1023    Subjective  Reports no falls since last visit.      Pertinent History  DM, HTN, falls, spinal stenosis of lumbar region, fibular fracture 6/3-mostly healed, has been getting injections in back     Limitations  Walking;Standing    Patient Stated Goals  "I want to be able to get some exercise to get this weight off so my back feels better."     Currently in Pain?  Yes    Pain Score  5     Pain Location  Knee    Pain Orientation  Right;Left   R>L   Pain Descriptors / Indicators  Aching    Pain Type  Chronic pain    Pain Onset  More than a month ago    Pain Frequency  Constant    Aggravating Factors   weather    Pain Relieving Factors  Tylenol, Tiger balm                       [facility] Adult PT Treatment/Exercise - 02/24/18 1040      Ambulation/Gait   Ambulation/Gait  Yes    Ambulation/Gait Assistance  6: Modified independent (Device/Increase time);5: Supervision    Ambulation/Gait Assistance Details  Ambulation around clinic without SPC with min cues for improved heel strike for more controlled gait pattern as she tends to "slap" BLEs when walking.  She is able to correct this however continue to note increased SOB following gait.  Pt able to recover in a few minutes of seated rest.     Ambulation Distance (Feet)  350 Feet   +115'   Assistive device  None    Gait Pattern  Step-through pattern;Decreased stride length;Trunk flexed;Narrow base of support;Right foot flat;Left foot flat    Ambulation Surface  Level;Indoor      Self-Care   Self-Care  Other Self-Care Comments    Other Self-Care Comments   Lengthy conversation during session regarding plans for continued fitness once therapy has ended.  Pt reports she does plan to get back into gym that is close to her as she can do Silver Sneakers there and also that a friend is encouraging her to go to gym with her.  Provided verbal education on [facility] as pt has heard of this.  Provided education on their rates and classes/machine room that they have to offer.  Pt verbalized understanding.        Exercises   Other Exercises   SL clam abd x 10 reps on each side with green theraband, hooklying B LE hip abd with  bridging x 10 reps with green theraband.  Sit<>stand from mat without UE support.  Attempted to do from standard arm chair, however was too low.   Performed x 10 reps with cues for increased forward trunk lean.  At counter top for LE strengthening (  hip) and also to carryover to improved gait quality stepping laterally over orange barrier x 10 reps each direction progressing to forward/backwards stepping over barrier (6") x 10 reps.  Pt with increased knee pain during stepping forward, therefore transitioned to having her step forward with RLE then back x 5 more reps on each side.               PT Education - 02/24/18 1608    Education Details  see self care    Person(s) Educated  Patient    Methods  Explanation    Comprehension  Verbalized understanding       PT Short Term Goals - 02/21/18 1042      PT SHORT TERM GOAL #1   Title  Pt will be independent with initial HEP in order to indicate improved functional mobility.  (Target Date: 02/14/18-goal date reflects 1 week delay in start)    Baseline  February 21, 2018- organized patients current HEP to be easier with comply with by stretching everday and strength exercises every other day.    Time  --    Period  --    Status  Achieved      PT SHORT TERM GOAL #2   Title  Will re-assess DGI and set appropriate LTG.     Baseline  02/21/18 15/24 indicating high fall risk     Time  --    Period  --    Status  Achieved      PT SHORT TERM GOAL #3   Title  Pt will be able to tolerate up to 6 minutes of activity without unsafe change in BP.     Baseline  02/21/18: pt's BP elevated with this activity    Time  --    Period  --    Status  Not Met      PT SHORT TERM GOAL #4   Title  Will assess 5TSS and set appropriate LTG to reflect improved LE strength and decreased fall risk.     Baseline  02/21/18 improved from baseline-today 16.75, baseline 2.4 sec    Time  --    Period  --    Status  Achieved      PT SHORT TERM GOAL #5   Title  Pt  will ambulate x 300' w/ rollator at mod I level over indoor sufaces in order to indicate safe indoor community mobility.      Baseline  02/21/18 met today    Status  Achieved      PT SHORT TERM GOAL #6   Status  New        PT Long Term Goals - 01/31/18 1315      PT LONG TERM GOAL #1   Title  Pt will be independent with final HEP and report transition to [facility] program in order to maintain mobility gains made in therapy.  (Target Date: 03/10/18-date reflects 1 week delay in start)    Time  6    Period  Weeks    Status  New      PT LONG TERM GOAL #2   Title  Pt will improve DGI >19/24 in order to indicate decreased fall risk.      Baseline  10/11: 12/24 indicating high fall risk potential     Status  New      PT LONG TERM GOAL #3   Title  Pt will improve 6MWT w/ LRAD by 2' from baseline in order to  indicate improved functional endurance.     Baseline  841' on 01/17/18    Status  Revised      PT LONG TERM GOAL #4   Title  Pt will ambulate up to 500' w/ LRAD over indoor/level and unlevel outdoor paved surfaces at mod I level in order to indicate safe community mobility.      Status  New      PT LONG TERM GOAL #5   Title  Pt will report return to shopping at farmer's market and cooking at home in order to indicate functional improvement in endurance and standing tolerance.     Status  New      Additional Long Term Goals   Additional Long Term Goals  Yes      PT LONG TERM GOAL #6   Title  Pt will perform 5xSTS from standard height chair using B UE's for assistance in </= 15 seconds indicating reduction in fall risk potential and improvement in LE power     Baseline  10/11: 22.4 seconds from standard height chair using B UE's for assist     Status  New            Plan - 02/24/18 1608    Clinical Impression Statement  Skilled session focused on discussion of post therapy community fitness to maintain gains made in therapy, and continued to address BLE  strength, balance, and endurance.  Pt continues to need seated rest breaks following standing or gait.      Rehab Potential  Good    Clinical Impairments Affecting Rehab Potential  co-morbidities including spinal stenosis and B knee pain    PT Frequency  2x / week    PT Duration  8 weeks    PT Treatment/Interventions  ADLs/Self Care Home Management;DME Instruction;Gait training;Stair training;Functional mobility training;Therapeutic activities;Therapeutic exercise;Balance training;Neuromuscular re-education;Patient/family education;Orthotic Fit/Training;Passive range of motion;Energy conservation;Vestibular    PT Next Visit Plan  continue to work on endurance/strengthening with Scifit/Nustep, balance activities with decr UE support, outdoors as weather permits, and bil LE strenthening    Consulted and Agree with Plan of Care  Patient       Patient will benefit from skilled therapeutic intervention in order to improve the following deficits and impairments:  Abnormal gait, Decreased activity tolerance, Decreased balance, Decreased endurance, Decreased knowledge of use of DME, Decreased mobility, Decreased range of motion, Decreased strength, Difficulty walking, Impaired perceived functional ability, Impaired flexibility, Impaired sensation, Postural dysfunction, Obesity  Visit Diagnosis: Muscle weakness (generalized)  Unsteadiness on feet  Other abnormalities of gait and mobility     Problem List Patient Active Problem List   Diagnosis Date Noted  . Shingles 10/31/2017  . Estrogen deficiency 10/31/2017  . Diabetic retinopathy ([place]) 10/28/2017  . Mixed stress and urge urinary incontinence 10/03/2017  . Closed right fibular fracture 10/03/2017  . Dependent edema 10/03/2017  . Lumbosacral radiculopathy 09/20/2017  . Diabetic polyneuropathy associated with type 2 diabetes mellitus ([place]) 09/20/2017  . Ulnar neuropathy at elbow of left upper extremity 09/20/2017  . Coronary artery  calcification 02/28/2017  . Neck pain 11/24/2016  . Otalgia of right ear 11/24/2016  . DDD (degenerative disc disease), cervical 11/24/2016  . Arthropathy 11/24/2016  . Left arm numbness 10/31/2016  . Hoarseness 10/31/2016  . Atypical chest pain 03/28/2016  . Tremor of both hands 12/15/2015  . Ataxia 12/15/2015  . Obstructive sleep apnea 11/17/2015  . 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 Periodic limb movements of  sleep 11/10/2015  . Obesity hypoventilation syndrome ([place]) 11/10/2015  . Chronic kidney disease (CKD), stage II (mild) 03/05/2013  . COLONIC POLYPS, ADENOMATOUS 05/03/2009  . GLAUCOMA NOS 02/25/2009  . Diabetic neuropathy, type II diabetes mellitus ([place]) 05/28/2008  . LOW BACK PAIN SYNDROME 05/28/2008  . 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 DM (diabetes mellitus), type 2, uncontrolled ([place]) 02/07/2007  . Hyperlipidemia 02/07/2007  . Morbid obesity ([place]) 02/07/2007  . Essential hypertension 02/07/2007  . TB SKIN TEST, POSITIVE 02/07/2007  . TOBACCO USE, QUIT 02/07/2007  . HYSTERECTOMY, HX OF 02/07/2007    Cameron Sprang, PT, MPT [facility] [address] [place], [place], [address] Phone: [phone]   Fax:  [phone] 02/24/18, 4:12 PM  Name: VAUDIE ENGEBRETSEN MRN: [id number] Date of Birth: 08/04/1945

## 2018-02-25 ENCOUNTER — Other Ambulatory Visit: Payer: Self-pay | Admitting: Family Medicine

## 2018-02-25 NOTE — Telephone Encounter (Signed)
Called patient and verified that she is taking 40 units daily.

## 2018-02-26 ENCOUNTER — Encounter: Payer: Self-pay | Admitting: Emergency Medicine

## 2018-02-26 ENCOUNTER — Ambulatory Visit (INDEPENDENT_AMBULATORY_CARE_PROVIDER_SITE_OTHER)
Admission: RE | Admit: 2018-02-26 | Discharge: 2018-02-26 | Disposition: A | Discharge status: Home / Self Care | Payer: Medicare Other | Source: Ambulatory Visit | Attending: Emergency Medicine | Admitting: Emergency Medicine

## 2018-02-26 ENCOUNTER — Ambulatory Visit: Payer: Medicare Other | Admitting: Emergency Medicine

## 2018-02-26 ENCOUNTER — Ambulatory Visit: Payer: Medicare Other | Admitting: Physical Therapy

## 2018-02-26 VITALS — BP 150/90 | HR 90 | Ht 64.0 in | Wt 283.0 lb

## 2018-02-26 DIAGNOSIS — Z23 Encounter for immunization: Secondary | ICD-10-CM

## 2018-02-26 DIAGNOSIS — D869 Sarcoidosis, unspecified: Secondary | ICD-10-CM | POA: Diagnosis not present

## 2018-02-26 NOTE — Patient Instructions (Signed)
We will perform a CXR today to follow your sarcoidosis.  Please continue your Symbicort 2 puffs twice daily.  Remember to rinse and gargle after using. Keep your albuterol available to use 2 puffs if you needed for shortness of breath, chest tightness, wheezing. Flu shot today. Congratulations on restarting your CPAP.  Try to use this reliably every night.  We will send a message to [employer] so that you can get your equipment replaced.  We will confirm your compliance at your next visit using your machine's data card Please call our office for any changes in your breathing or if you involve rash characteristic of your sarcoid Follow with Dr Lamonte Sakai in 6 months or sooner if you have any problems

## 2018-02-26 NOTE — Progress Notes (Signed)
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 Also with a history of severe obstructive sleep apnea, currently on CPAP cm water. She has obstructive lung disease associated with her sarcoidosis. Also allergic rhinitis. She underwent a CT chest 09/05/16 that I have reviewed. This showed no evidence of interstitial disease, infiltrates, lymphadenopathy. She did have some subtle paraseptal emphysema. Also showed some evidence coronary calcium. She believes that her exertional SOB is worse compared with last time. More hoarse over the last 2 months - saw ENT and ruled out lesion. Speculated GERD so she started omeprazole. She blows her nose a lot, has congestion. She used to be on zyrtec. She remains on symbicort - she is unsure whether it helps her, may benefit her some. She has gained a bit of weight.   ROV 02/26/18 --Ms. Rebecca Hicks is a 72 and follows up today for her history of cutaneous and pulmonary sarcoidosis, obstructive sleep apnea on CPAP, GERD, allergic rhinitis.  She has obstructive lung disease associated with her sarcoid, currently managed on she is on Symbicort, uses albuterol about 2x a week. She has been doing PT for her back, her endurance is improving. She had some rash in late May, resolved without oral steroids. She is planning to see Dr Acie Fredrickson soon for some CP.   She has not been using her CPAP reliably for several months, but now she has restarted for the last 2 weeks. Her DME company is [employer] and she needs new equipment. No O2 currently connected to her CPAP.   Needs the flu shot.    Exam:  Vitals:   02/26/18 1500  BP: (!) 150/90  Pulse: 90  SpO2: 95%  Weight: 283 lb (128.4 kg)  Height: 5\' 4"  (1.626 m)    Gen: Pleasant, obese, in no distress,  normal affect, obese   ENT: No lesions,  mouth clear,  oropharynx clear, no postnasal drip  Neck: No JVD, no Stridor  Lungs: No use of accessory muscles, somewhat distant, no crackles or  wheezing  Cardiovascular: RRR, heart sounds normal, no murmur or gallops, no peripheral edema  Musculoskeletal: No deformities, no cyanosis or clubbing  Neuro: alert, non focal  Skin: no rash   09/05/16 CT chest  COMPARISON:  Chest CT 03/01/2015.  FINDINGS: Cardiovascular: Heart size is normal. There is no significant pericardial fluid, thickening or pericardial calcification. There is aortic atherosclerosis, as well as atherosclerosis of the great vessels of the mediastinum and the coronary arteries, including calcified atherosclerotic plaque in the left main, left anterior descending, left circumflex and right coronary arteries.  Mediastinum/Nodes: No pathologically enlarged mediastinal or hilar lymph nodes. Please note that accurate exclusion of hilar adenopathy is limited on noncontrast CT scans. Esophagus is unremarkable in appearance. No axillary lymphadenopathy.  Lungs/Pleura: Small calcified granuloma in the periphery of the left upper lobe is unchanged. No other suspicious appearing pulmonary nodules or masses. No perilymphatic nodularity noted to suggest underlying sarcoidosis. Mild diffuse bronchial wall thickening with very mild centrilobular and paraseptal emphysema. No acute consolidative airspace disease. No pleural effusions.  Upper Abdomen: Aortic atherosclerosis.  Musculoskeletal: There are no aggressive appearing lytic or blastic lesions noted in the visualized portions of the skeleton.  IMPRESSION: 1. No imaging stigmata of sarcoidosis. 2. No acute findings are noted in the thorax.  3. Mild diffuse bronchial wall thickening with very mild centrilobular and paraseptal emphysema; imaging findings suggestive of underlying COPD. 4. Aortic atherosclerosis, in addition to left main and 3 vessel coronary artery  disease. Please note that although the presence of coronary artery calcium documents the presence of coronary artery disease, the severity of  this disease and any potential stenosis cannot be assessed on this non-gated CT examination. Assessment for potential risk factor modification, dietary therapy or pharmacologic therapy may be warranted, if clinically indicated    Obstructive sleep apnea  Congratulations on restarting your CPAP.  Try to use this reliably every night.  We will send a message to [employer] so that you can get your equipment replaced.  We will confirm your compliance at your next visit using your machine's data card   SARCOIDOSIS, PULMONARY We will perform a CXR today to follow your sarcoidosis.  Please continue your Symbicort 2 puffs twice daily.  Remember to rinse and gargle after using. Keep your albuterol available to use 2 puffs if you needed for shortness of breath, chest tightness, wheezing. Flu shot today.  Baltazar Apo, MD, PhD 02/26/2018, 3:42 PM [facility] [phone] or if no answer [phone]

## 2018-02-26 NOTE — Assessment & Plan Note (Signed)
  Congratulations on restarting your CPAP.  Try to use this reliably every night.  We will send a message to [employer] so that you can get your equipment replaced.  We will confirm your compliance at your next visit using your machine's data card

## 2018-02-26 NOTE — Assessment & Plan Note (Signed)
We will perform a CXR today to follow your sarcoidosis.  Please continue your Symbicort 2 puffs twice daily.  Remember to rinse and gargle after using. Keep your albuterol available to use 2 puffs if you needed for shortness of breath, chest tightness, wheezing. Flu shot today.

## 2018-02-27 ENCOUNTER — Encounter: Payer: Self-pay | Admitting: Rehabilitation

## 2018-02-27 ENCOUNTER — Ambulatory Visit: Payer: Medicare Other | Admitting: Rehabilitation

## 2018-02-27 ENCOUNTER — Ambulatory Visit: Payer: Medicare Other | Admitting: Pharmacist

## 2018-02-27 VITALS — BP 145/90 | HR 74 | Resp 92

## 2018-02-27 DIAGNOSIS — G4736 Sleep related hypoventilation in conditions classified elsewhere: Secondary | ICD-10-CM

## 2018-02-27 DIAGNOSIS — M6281 Muscle weakness (generalized): Secondary | ICD-10-CM

## 2018-02-27 DIAGNOSIS — E11649 Type 2 diabetes mellitus with hypoglycemia without coma: Secondary | ICD-10-CM

## 2018-02-27 DIAGNOSIS — J22 Unspecified acute lower respiratory infection: Secondary | ICD-10-CM | POA: Diagnosis not present

## 2018-02-27 DIAGNOSIS — E1142 Type 2 diabetes mellitus with diabetic polyneuropathy: Secondary | ICD-10-CM | POA: Diagnosis not present

## 2018-02-27 DIAGNOSIS — R2681 Unsteadiness on feet: Secondary | ICD-10-CM

## 2018-02-27 DIAGNOSIS — I10 Essential (primary) hypertension: Secondary | ICD-10-CM

## 2018-02-27 DIAGNOSIS — R2689 Other abnormalities of gait and mobility: Secondary | ICD-10-CM

## 2018-02-27 DIAGNOSIS — J984 Other disorders of lung: Secondary | ICD-10-CM

## 2018-02-27 LAB — POCT GLYCOSYLATED HEMOGLOBIN (HGB A1C): HbA1c, POC (controlled diabetic range): 10.1 % — AB (ref 0.0–7.0)

## 2018-02-27 MED ORDER — METOPROLOL SUCCINATE ER 50 MG PO TB24: 50.0000 mg | ORAL_TABLET | Freq: Every evening | ORAL | 3 refills | Status: DC

## 2018-02-27 MED ORDER — INSULIN LISPRO 100 UNIT/ML ~~LOC~~ SOLN: SUBCUTANEOUS | 3 refills | Status: DC

## 2018-02-27 MED ORDER — BUDESONIDE-FORMOTEROL FUMARATE 160-4.5 MCG/ACT IN AERO: INHALATION_SPRAY | RESPIRATORY_TRACT | 2 refills | Status: DC

## 2018-02-27 MED ORDER — INSULIN DEGLUDEC 100 UNIT/ML ~~LOC~~ SOPN: 45.0000 [IU] | PEN_INJECTOR | Freq: Every day | SUBCUTANEOUS | 3 refills | Status: DC

## 2018-02-27 MED ORDER — ALBUTEROL SULFATE HFA 108 (90 BASE) MCG/ACT IN AERS: 2.0000 | INHALATION_SPRAY | RESPIRATORY_TRACT | 1 refills | Status: DC | PRN

## 2018-02-27 MED ORDER — METFORMIN HCL ER 750 MG PO TB24: 750.0000 mg | ORAL_TABLET | Freq: Every day | ORAL | 3 refills | Status: DC

## 2018-02-27 MED ORDER — INSULIN PEN NEEDLE 31G X 8 MM MISC: 2 refills | Status: DC

## 2018-02-27 MED ORDER — METFORMIN HCL ER 750 MG PO TB24: 750.0000 mg | ORAL_TABLET | Freq: Two times a day (BID) | ORAL | 3 refills | Status: DC

## 2018-02-27 MED ORDER — PREGABALIN 50 MG PO CAPS: 50.0000 mg | ORAL_CAPSULE | Freq: Three times a day (TID) | ORAL | 2 refills | Status: DC

## 2018-02-27 MED ORDER — TOLTERODINE TARTRATE 1 MG PO TABS: 1.0000 mg | ORAL_TABLET | Freq: Two times a day (BID) | ORAL | 2 refills | Status: DC

## 2018-02-27 NOTE — Therapy (Signed)
[facility] [address] [place], [place], [address] Phone: [phone]   Fax:  [phone]  Physical Therapy Treatment  Patient Details  Name: Rebecca Hicks MRN: [id number] Date of Birth: [birth year] Referring Provider (PT): Almedia Balls, MD   Encounter Date: 02/27/2018  PT End of Session - 02/27/18 1245    Visit Number  13    Number of Visits  17    Date for PT Re-Evaluation  03/10/18    Authorization Type  [employer] Medicare    PT Start Time  1018    PT Stop Time  1102    PT Time Calculation (min)  44 min    Equipment Utilized During Treatment  Gait belt    Activity Tolerance  Patient tolerated treatment well    Behavior During Therapy  WFL for tasks assessed/performed       Past Medical History:  Diagnosis Date  . Abdominal pain 12/05/2011  . Abdominal pain, left upper quadrant 12/03/2012  . Acute on chronic renal failure ([place]) 12/25/2012  . Arthritis   . 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 Ataxia 12/15/2015  . Atypical chest pain 03/28/2016  . Cataract   . CERUMEN IMPACTION, RIGHT 01/31/2010   Qualifier: Diagnosis of  By: Dianah Field MD, Marcello Moores    . Chest tightness 02/23/2014  . Chronic kidney disease (CKD), stage II (mild) 03/05/2013   Looks like baseline creat = 1.2 -1.3.  Watch for overdiuresis.   . COLONIC POLYPS, ADENOMATOUS 05/03/2009   Every five year colonoscopy due to adenomatous polyp found 04/2009    . DDD (degenerative disc disease), cervical 11/24/2016  . Diabetes mellitus   . Diabetic neuropathy, type II diabetes mellitus ([place]) 05/28/2008   Qualifier: Diagnosis of  By: Andria Frames MD, Gwyndolyn Saxon    . Diarrhea 01/21/2015  . DM (diabetes mellitus), type 2, uncontrolled ([place]) 02/07/2007   Qualifier: Diagnosis of  By: Wynetta Emery RN, Doroteo Bradford    . 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 Essential hypertension 02/07/2007   Qualifier: Diagnosis of  By: Wynetta Emery RN, Doroteo Bradford    . Excessive sleepiness 04/07/2014  . Fatigue  05/04/2011  . Glaucoma   . GLAUCOMA NOS 02/25/2009   Qualifier: Diagnosis of  By: Andria Frames MD, Gwyndolyn Saxon    . Great toe pain 09/18/2012  . History of nuclear stress test    Myoview 11/17: EF 64, Normal pharmacologic nuclear stress test with no evidence of prior infarct or ischemia.  Marland Kitchen Hoarseness 10/31/2016  . Hyperlipidemia   . Hypertension   . LOW BACK PAIN SYNDROME 05/28/2008   Qualifier: Diagnosis of  By: Andria Frames MD, Gwyndolyn Saxon    . Morbid obesity ([place])   . Neck pain 11/24/2016  . Neuromuscular disorder ([place])   . Obesity hypoventilation syndrome ([place]) 11/10/2015  . Obstructive sleep apnea 11/17/2015  . Otalgia of right ear 11/24/2016  . Polyneuropathy in diabetes(357.2)   . Sarcoidosis   . Sinusitis acute 10/03/2010  . 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 - She was fitted with a Small size [employer] Full Face Mask Simplus mask and heated humidification.   . Snoring 09/05/2015  . TB SKIN TEST, POSITIVE 02/07/2007   Annotation: with clear CXR mid 1990's Qualifier: History of  By: Milana Obey, Doroteo Bradford    . TOBACCO USE, QUIT 02/07/2007   Qualifier: Diagnosis of  By: Andria Frames MD, Gwyndolyn Saxon    .  Tremor of both hands 12/15/2015  . U R I 03/15/2010   Qualifier: Diagnosis of  By: Andria Frames MD, Gwyndolyn Saxon      Past Surgical History:  Procedure Laterality Date  . ABDOMINAL HYSTERECTOMY    . APPENDECTOMY    . CATARACT EXTRACTION, BILATERAL  03/2010, 04/2010  . COLONOSCOPY  2011  . EYE SURGERY    . POLYPECTOMY  2011   polyps, hems   . SPINE SURGERY     lumbar laminectomy X 2  . TONSILLECTOMY      There were no vitals filed for this visit.  Subjective Assessment - 02/27/18 1025    Subjective  Reports increased leg pain today, not so much knee pain.     Pertinent History  DM, HTN, falls, spinal stenosis of lumbar region, fibular fracture 6/3-mostly healed, has been getting  injections in back    Limitations  Walking;Standing    Patient Stated Goals  "I want to be able to get some exercise to get this weight off so my back feels better."     Currently in Pain?  Yes    Pain Score  5     Pain Location  Leg    Pain Orientation  Right;Left    Pain Descriptors / Indicators  Sore    Pain Type  Acute pain    Pain Onset  In the past 7 days    Pain Frequency  Intermittent    Aggravating Factors   increasing activity    Pain Relieving Factors  tylenol                       [facility] Adult PT Treatment/Exercise - 02/27/18 1020      Ambulation/Gait   Ambulation/Gait  Yes    Ambulation/Gait Assistance  6: Modified independent (Device/Increase time)    Ambulation/Gait Assistance Details  Note that gait into [facility] with [facility] was very antalgic today.  Also noted pt to be SOB.  Pt reports she had MD appt prior to this session and has been running behind all day.  She also reports increased pain in LEs.  When questioned about using rollator, she reports she only uses it in the house.  Provided education on use of rollator esp when in community and ambulating for longer distances as this will provide more support and allow her to rest when needed.  Pt verbalized understanding.     Ambulation Distance (Feet)  100 Feet    Assistive device  Straight cane    Gait Pattern  Step-through pattern;Decreased stride length;Trunk flexed;Narrow base of support;Right foot flat;Left foot flat;Antalgic    Ambulation Surface  Level;Indoor      Exercises   Exercises  Knee/Hip    Other Exercises   Pt reporting increased pain and soreness on outer portion of B LEs (ITB and TFL region).  Had pt lie supine and attempted gentle stretching with knee to chest and knee to opposite shoulder.  Pt unable to fully relax for stretching, therefore discontinued.  Had pt lie on side and performed light foam roller soft tissue mobilization x 3 mins on L side.  Note MARKED tenderness over hip bursa.   Then performed on right side, however she was not able to tolerate even very light pressure over R bursa.  Educated on having assist to perform this at home and how to perform with rolling pin.  Also educated on use of ice to reduce inflammation and reduce pain.  Then tried SL  IT band stretch, however was unable to get good stretch here, therefore tried supine with straight leg across body.  PT was able to get good stretch in this position, therefore provided education on how to do either with belt/strap or to have sister assist.  Ended session with B hip strengthening: standing hip abd x 10 reps on each side with cues for improved glute med activation on stance leg and standing hip extension x 10 reps on each side.   Provided clam exercise as we performed in last session as PT feels that due to increased hip weakness and her doing more, this is why she has ITB tenderness and possible bursitis.   Pt verbalized understanding.        Manual Therapy   Manual Therapy  Soft tissue mobilization    Manual therapy comments  See above for details on STM to B ITB/TFL             PT Education - 02/27/18 1244    Education Details  see gait and TE sections    Person(s) Educated  Patient    Methods  Explanation;Demonstration;Handout    Comprehension  Verbalized understanding;Returned demonstration       PT Short Term Goals - 02/21/18 1042      PT SHORT TERM GOAL #1   Title  Pt will be independent with initial HEP in order to indicate improved functional mobility.  (Target Date: 02/14/18-goal date reflects 1 week delay in start)    Baseline  February 21, 2018- organized patients current HEP to be easier with comply with by stretching everday and strength exercises every other day.    Time  --    Period  --    Status  Achieved      PT SHORT TERM GOAL #2   Title  Will re-assess DGI and set appropriate LTG.     Baseline  02/21/18 15/24 indicating high fall risk     Time  --    Period  --    Status   Achieved      PT SHORT TERM GOAL #3   Title  Pt will be able to tolerate up to 6 minutes of activity without unsafe change in BP.     Baseline  02/21/18: pt's BP elevated with this activity    Time  --    Period  --    Status  Not Met      PT SHORT TERM GOAL #4   Title  Will assess 5TSS and set appropriate LTG to reflect improved LE strength and decreased fall risk.     Baseline  02/21/18 improved from baseline-today 16.75, baseline 2.4 sec    Time  --    Period  --    Status  Achieved      PT SHORT TERM GOAL #5   Title  Pt will ambulate x 300' w/ rollator at mod I level over indoor sufaces in order to indicate safe indoor community mobility.      Baseline  02/21/18 met today    Status  Achieved      PT SHORT TERM GOAL #6   Status  New        PT Long Term Goals - 01/31/18 1315      PT LONG TERM GOAL #1   Title  Pt will be independent with final HEP and report transition to [employer] program in order to maintain mobility gains made in therapy.  (Target Date: 03/10/18-date  reflects 1 week delay in start)    Time  6    Period  Weeks    Status  New      PT LONG TERM GOAL #2   Title  Pt will improve DGI >19/24 in order to indicate decreased fall risk.      Baseline  10/11: 12/24 indicating high fall risk potential     Status  New      PT LONG TERM GOAL #3   Title  Pt will improve 6MWT w/ LRAD by 58' from baseline in order to indicate improved functional endurance.     Baseline  841' on 01/17/18    Status  Revised      PT LONG TERM GOAL #4   Title  Pt will ambulate up to 500' w/ LRAD over indoor/level and unlevel outdoor paved surfaces at mod I level in order to indicate safe community mobility.      Status  New      PT LONG TERM GOAL #5   Title  Pt will report return to shopping at farmer's market and cooking at home in order to indicate functional improvement in endurance and standing tolerance.     Status  New      Additional Long Term Goals   Additional Long  Term Goals  Yes      PT LONG TERM GOAL #6   Title  Pt will perform 5xSTS from standard height chair using B UE's for assistance in </= 15 seconds indicating reduction in fall risk potential and improvement in LE power     Baseline  10/11: 22.4 seconds from standard height chair using B UE's for assist     Status  New            Plan - 02/27/18 1245    Clinical Impression Statement  Skilled session focused on education regarding need for rollator in community to reduce LE pain, improve safety, and decrease gait compensations with fatigue.  Also noted marked tenderness over B hip bursa/TFL and along ITB with foam roller.  Educated on use of ice and performing soft tissue mobilization at home with assist from sister.     Rehab Potential  Good    Clinical Impairments Affecting Rehab Potential  co-morbidities including spinal stenosis and B knee pain    PT Frequency  2x / week    PT Duration  8 weeks    PT Treatment/Interventions  ADLs/Self Care Home Management;DME Instruction;Gait training;Stair training;Functional mobility training;Therapeutic activities;Therapeutic exercise;Balance training;Neuromuscular re-education;Patient/family education;Orthotic Fit/Training;Passive range of motion;Energy conservation;Vestibular    PT Next Visit Plan  check compliance with added HEP for ITB issues, continue to work on endurance/strengthening with Scifit/Nustep, balance activities with decr UE support, outdoors as weather permits, and bil LE strenthening    Consulted and Agree with Plan of Care  Patient       Patient will benefit from skilled therapeutic intervention in order to improve the following deficits and impairments:  Abnormal gait, Decreased activity tolerance, Decreased balance, Decreased endurance, Decreased knowledge of use of DME, Decreased mobility, Decreased range of motion, Decreased strength, Difficulty walking, Impaired perceived functional ability, Impaired flexibility, Impaired  sensation, Postural dysfunction, Obesity  Visit Diagnosis: Muscle weakness (generalized)  Unsteadiness on feet  Other abnormalities of gait and mobility     Problem List Patient Active Problem List   Diagnosis Date Noted  . Shingles 10/31/2017  . Estrogen deficiency 10/31/2017  . Diabetic retinopathy ([place]) 10/28/2017  . Mixed stress and  urge urinary incontinence 10/03/2017  . Closed right fibular fracture 10/03/2017  . Dependent edema 10/03/2017  . Lumbosacral radiculopathy 09/20/2017  . Diabetic polyneuropathy associated with type 2 diabetes mellitus ([place]) 09/20/2017  . Ulnar neuropathy at elbow of left upper extremity 09/20/2017  . Coronary artery calcification 02/28/2017  . Neck pain 11/24/2016  . Otalgia of right ear 11/24/2016  . DDD (degenerative disc disease), cervical 11/24/2016  . Arthropathy 11/24/2016  . Left arm numbness 10/31/2016  . Hoarseness 10/31/2016  . Atypical chest pain 03/28/2016  . Tremor of both hands 12/15/2015  . Ataxia 12/15/2015  . Obstructive sleep apnea 11/17/2015  . 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 Periodic limb movements of sleep 11/10/2015  . Obesity hypoventilation syndrome ([place]) 11/10/2015  . Chronic kidney disease (CKD), stage II (mild) 03/05/2013  . COLONIC POLYPS, ADENOMATOUS 05/03/2009  . GLAUCOMA NOS 02/25/2009  . Diabetic neuropathy, type II diabetes mellitus ([place]) 05/28/2008  . LOW BACK PAIN SYNDROME 05/28/2008  . 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 DM (diabetes mellitus), type 2, uncontrolled ([place]) 02/07/2007  . Hyperlipidemia 02/07/2007  . Morbid obesity ([place]) 02/07/2007  . Essential hypertension 02/07/2007  . TB SKIN TEST, POSITIVE 02/07/2007  . TOBACCO USE, QUIT 02/07/2007  . HYSTERECTOMY, HX OF 02/07/2007    Cameron Sprang, PT, MPT [facility] [address] [place], [place], [address] Phone: [phone]   Fax:   [phone] 02/27/18, 12:57 PM  Name: SHAREN YOUNGREN MRN: [id number] Date of Birth: [birth year]

## 2018-02-27 NOTE — Assessment & Plan Note (Signed)
Diabetes longstanding since 2008 and currently uncontrolled, likely exacerbated by pain. Patient is able to verbalize appropriate hypoglycemia management plan. Patient is adherent with medication. Control is suboptimal due to pain, dietary habits -Increased metformin from IR 500 mg to XR 750 mg BID. Patient will report if she is unable to tolerate this higher dose; if so, can be reduced to XR 500 mg BID.  -Increased dose of basal insulin Tresiba (insulin degludec) to 45 units daily.  -Increased dose of  rapid insulin Humalog (insulin lispro) to 25 units with supper, continued 20 units with morning meal.  -Continued GLP-1 Victoza (liraglutide) at 1.8mg .  -Continued SGLT2-I Jardiance (empagliflozin) at 25mg . Counseled on sick day rules for stopping medication with dehydration episodes. Jardiance diuresis may be impacting urinary complaints, but will avoid decreasing this medication due to worsened glucose control. -Extensively discussed pathophysiology of DM, recommended lifestyle interventions, dietary effects on glycemic control -Counseled on s/sx of and management of hypoglycemia -Counseled to check more 2 hour post-prandial BG to evaluate impact/efficacy of mealtime insulin regimen. -Next A1C anticipated 05/2018.

## 2018-02-27 NOTE — Patient Instructions (Addendum)
It was great to see you today!   We have sent in a higher dose of metformin using the extended release formulation. Try this 750 mg tablet and see how you tolerate this.    Increase Tresiba to 45 units daily.  Increase supper Humalog to 25 units daily.   We are going to adjust Lyrica to 50 mg three times daily.   Moving forward, try checking sugars ~2 hours after a meal, particularly your largest evening meal. This will help [place] figure out how to adjust sugars moving forward.   If you start to experience blood sugars consistently less than 80, give clinic a call.   Schedule a follow up appointment with [place] in ~3 weeks (Monday, November 25th)

## 2018-02-27 NOTE — Patient Instructions (Addendum)
   ILIOTIBIAL BAND STRETCH WITH BELT - ITB  Loop a belt around your foot or you can use a sheet/towel. While lying on your back and leg up in front of you and knee straight, bring your leg across midline for a gentle stretch felt along your outer thigh.     ITB with rolling pin:   Wrap rolling pin with wash cloth or hand towel and tape in place.  Lie on your side and have someone assist you with GENTLY rolling pin from right above your knee to your hip.  Make sure you apply ice about 10 mins before doing roller.  Do rolling pin exercise for 3-5 mins as able.  If you can do 2 times per day that would be great, but at least once.     Abduction: Clam (Eccentric) - Side-Lying    Lie on side with knees bent. Lift top knee, keeping feet together. Keep trunk steady. Slowly lower for 3-5 seconds. __10_ reps per set, _1__ sets per day, _5__ days per week.  http://ecce.exer.us/65   Copyright  VHI. All rights reserved.

## 2018-02-27 NOTE — Progress Notes (Addendum)
S:     Chief Complaint  Patient presents with  . Medication Management    Diabetes    Patient arrives in good spirits, ambulating without assistance.  Presents for diabetes evaluation, education, and management at the request of PCP Dr. Andria Frames. Patient was referred on 10/31/2017.  Patient was last seen by Primary Care Provider on 10/31/2017 .   Reports SMBG lower on days with physical therapy, sometimes down to the 90s. She notes that a [place] performed a A1c during a housecall in August, and reported it to her to be 8.8%. She reports that her last steroid injection was in her knee in August.  She notes that she believes her pregabalin to be helping with her neuropathy. She was previously on 150 mg BID, but has been reduced to 75 mg once daily in the evening. She still reports some "shaking", but cannot specify a time during the day that it is worse. She is unsure if her current dose of duloxetine is helping. She notes that gabapentin was started by Dr. Andria Frames in July for occasional sharp, shooting pains that they believed to be related to Shingles. She is not reporting the sedation with this dose of gabapentin that she previously experienced on higher doses.   At recent visit with [facility], she was increased to rosuvastatin 20 mg daily, and reports tolerating this so far.   She reports an episode in May where she went for a back injection, but then had a fall getting into her car after the appointment. She reports decreased bladder control immediately after that, though reports that an MRI performed in the ED was clear. When she last saw Dr. Andria Frames, she was started on tolterodine 1 mg daily. She notes that after taking this for a month, she increased to 1 mg twice daily, and that she believes this to be helping.   She requests refills today on metformin, albuterol, Symbicort, and shorter pen needles (31 g 8 mm)  Patient reports Diabetes was diagnosed in 2008.   Insurance  coverage/medication affordability: [employer]   Patient reports adherence with medications.  Current diabetes medications include: metformin 1000 mg daily (500 mg tab), Jardiance 25 mg daily, Victoza 1.8 mg daily, Tresiba 40 mg daily, Humalog BG <140 : 15 units, BG >140: 20 units BID; using 20 more often  Current hypertension medications include: Telmisartan-HCTZ 80-12.5 mg daily (AM), spironolactone 25 mg daily (AM), metoprolol succinate 50 mg daily (PM), amlodipine 10 mg daily (AM), clonidine 0.1 mg BID   Patient denies hypoglycemic events.  Patient reported dietary habits: Eats 2 meals/day, though snacks Breakfast: none Lunch: moderate  Dinner: heaviest meal time Snacks: occasionally  Patient reports nocturia.  Patient reports neuropathy. Patient denies visual changes. Patient denies self foot exams.   O:  Physical Exam  Constitutional: She appears well-developed and well-nourished.  Vitals reviewed.   Review of Systems  All other systems reviewed and are negative.  Lab Results  Component Value Date   HGBA1C 10.1 (A) 02/27/2018   Vitals:   02/27/18 0915  BP: (!) 145/90  Pulse: 74  Resp: (!) 92   - Did not take antihypertensives this morning   Lipid Panel     Component Value Date/Time   CHOL 288 (H) 05/09/2017 1025   TRIG 103 05/09/2017 1025   HDL 57 05/09/2017 1025   CHOLHDL 5.1 (H) 05/09/2017 1025   CHOLHDL 4.1 01/20/2015 1003   VLDL 10 01/20/2015 1003   LDLCALC  210 (H) 05/09/2017 1025   LDLDIRECT 223 (H) 10/13/2015 0932    Home fasting CBG: unknown Random CBG: upper 190s - low 200s  Reports home BP numbers of 145-150/90;    A/P: Diabetes longstanding since 2008 and currently uncontrolled, likely exacerbated by pain. Patient is able to verbalize appropriate hypoglycemia management plan. Patient is adherent with medication. Control is suboptimal due to pain, dietary habits -Increased metformin from IR 500 mg to XR 750 mg BID. Patient will  report if she is unable to tolerate this higher dose; if so, can be reduced to XR 500 mg BID.  -Increased dose of basal insulin Tresiba (insulin degludec) to 45 units daily.  -Increased dose of  rapid insulin Humalog (insulin lispro) to 25 units with supper, continued 20 units with morning meal.  -Continued GLP-1 Victoza (liraglutide) at 1.8mg .  -Continued SGLT2-I Jardiance (empagliflozin) at 25mg . Counseled on sick day rules for stopping medication with dehydration episodes. Jardiance diuresis may be impacting urinary complaints, but will avoid decreasing this medication due to worsened glucose control. -Extensively discussed pathophysiology of DM, recommended lifestyle interventions, dietary effects on glycemic control -Counseled on s/sx of and management of hypoglycemia -Counseled to check more 2 hour post-prandial BG to evaluate impact/efficacy of mealtime insulin regimen. -Next A1C anticipated 05/2018.   Neuropathic Pain, uncontrolled; Once daily dosing of pregabalin may not be providing full benefit. Discussed with PCP Dr. Andria Frames;  - Start pregabalin 50 mg TID. - Continue duloxetine 30 mg daily. Moving forward, could consider increasing this dose.    Medication Management - Provided refills on Albuterol HFA, Symbicort, and tolterodine.  - Prescribed metoprolol as 50 mg tablets for patient ease, as she was previous splitting 100 mg tablets.  - Counseled patient on appropriate nitroglycerin administration   Written patient instructions provided.  Total time in face to face counseling 60 minutes.   Follow up Pharmacist/PCP Clinic Visit in 1 month. Patient seen with Andee Poles, PharmD Candidate and Catie Darnelle Maffucci, PharmD,  PGY2 Pharmacy Resident.    Albuterol requested was required to be "pro-air" - New Rx sent to pharmacies.

## 2018-02-27 NOTE — Assessment & Plan Note (Signed)
Neuropathic Pain, uncontrolled; Once daily dosing of pregabalin may not be providing full benefit. Discussed with PCP Dr. Andria Frames;  - Start pregabalin 50 mg TID. - Continue duloxetine 30 mg daily. Moving forward, could consider increasing this dose.

## 2018-02-28 ENCOUNTER — Telehealth: Payer: Self-pay

## 2018-02-28 DIAGNOSIS — D869 Sarcoidosis, unspecified: Secondary | ICD-10-CM

## 2018-02-28 NOTE — Progress Notes (Signed)
Patient ID: Rebecca Hicks, female   DOB: 08/12/1945, 72 y.o.   MRN: [id number] REviewed: Agree with Dr. Graylin Shiver documentation and management.

## 2018-02-28 NOTE — Telephone Encounter (Signed)
Please re-send Albuterol inhaler as Proair HFA which is preferred on patient's insurance.  Danley Danker, RN [facility] RN)

## 2018-03-03 ENCOUNTER — Telehealth: Payer: Self-pay | Admitting: Pharmacist

## 2018-03-03 ENCOUNTER — Ambulatory Visit: Payer: Medicare Other | Admitting: Physical Therapy

## 2018-03-03 ENCOUNTER — Encounter: Payer: Self-pay | Admitting: Cardiovascular Disease

## 2018-03-03 ENCOUNTER — Telehealth: Payer: Self-pay | Admitting: Emergency Medicine

## 2018-03-03 ENCOUNTER — Ambulatory Visit: Payer: Medicare Other | Admitting: Cardiovascular Disease

## 2018-03-03 ENCOUNTER — Telehealth: Payer: Self-pay

## 2018-03-03 VITALS — BP 140/70 | HR 70 | Ht 64.0 in | Wt 286.6 lb

## 2018-03-03 DIAGNOSIS — I5032 Chronic diastolic (congestive) heart failure: Secondary | ICD-10-CM | POA: Diagnosis not present

## 2018-03-03 MED ORDER — ALBUTEROL SULFATE HFA 108 (90 BASE) MCG/ACT IN AERS: 2.0000 | INHALATION_SPRAY | Freq: Four times a day (QID) | RESPIRATORY_TRACT | 2 refills | Status: DC | PRN

## 2018-03-03 MED ORDER — ALBUTEROL SULFATE HFA 108 (90 BASE) MCG/ACT IN AERS: 2.0000 | INHALATION_SPRAY | Freq: Four times a day (QID) | RESPIRATORY_TRACT | 6 refills | Status: DC | PRN

## 2018-03-03 NOTE — Telephone Encounter (Signed)
ProAir also required a PA. Clinical info submitted via CoverMyMeds. Status pending. Response may take 3-5 days.  Key: [id number]  Danley Danker, RN [facility] RN)

## 2018-03-03 NOTE — Telephone Encounter (Signed)
Called and spoke to patient, requesting CXR results. Informed patient I would send a message to RB as it has not been resulted yet. Voiced understanding.   RB please advise of CXR results. Thanks.

## 2018-03-03 NOTE — Telephone Encounter (Signed)
Pharmacy Student Andee Poles contacted patient and communicated her albuterol inhaler refill was resent to her pharmacy and she should be able to pick up today.     Also clarified our plan to stop gabapentin 100mg  TID as we increased the dose and frequency of her Lyrica at the visit on Thursday 11/7.   Patient verbalized understanding of this change.

## 2018-03-03 NOTE — Patient Instructions (Signed)
Medication Instructions:  Your physician recommends that you continue on your current medications as directed. Please refer to the Current Medication list given to you today.  If you need a refill on your cardiac medications before your next appointment, please call your pharmacy.   Lab work: TODAY - basic metabolic panel  If you have labs (blood work) drawn today and your tests are completely normal, you will receive your results only by: Marland Kitchen MyChart Message (if you have MyChart) OR . A paper copy in the mail If you have any lab test that is abnormal or we need to change your treatment, we will call you to review the results.   Testing/Procedures: None Ordered   Follow-Up: At [facility], you and your health needs are our priority.  As part of our continuing mission to provide you with exceptional heart care, we have created designated Provider Care Teams.  These Care Teams include your primary Cardiologist (physician) and Advanced Practice Providers (APPs -  Physician Assistants and Nurse Practitioners) who all work together to provide you with the care you need, when you need it. You will need a follow up appointment in:  6 months.  Please call our office 2 months in advance to schedule this appointment.  You may see Mertie Moores, MD or one of the following Advanced Practice Providers on your designated Care Team: Richardson Dopp, PA-C [place], [place] . Daune Perch, NP
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
## 2018-03-03 NOTE — Addendum Note (Signed)
Addended by: Leavy Cella on: 03/03/2018 10:32 AM   Modules accepted: Orders

## 2018-03-04 LAB — BASIC METABOLIC PANEL
BUN / CREAT RATIO: 20 (ref 12–28)
BUN: 33 mg/dL — ABNORMAL HIGH (ref 8–27)
CHLORIDE: 103 mmol/L (ref 96–106)
CO2: 20 mmol/L (ref 20–29)
Calcium: 9.4 mg/dL (ref 8.7–10.3)
Creatinine, Ser: 1.68 mg/dL — ABNORMAL HIGH (ref 0.57–1.00)
GFR calc non Af Amer: 30 mL/min/{1.73_m2} — ABNORMAL LOW (ref >59)
GFR, EST AFRICAN AMERICAN: 35 mL/min/{1.73_m2} — AB (ref >59)
Glucose: 171 mg/dL — ABNORMAL HIGH (ref 65–99)
POTASSIUM: 4.7 mmol/L (ref 3.5–5.2)
Sodium: 141 mmol/L (ref 134–144)

## 2018-03-04 NOTE — Telephone Encounter (Signed)
Spoke with [facility]. Was filled on 11/1. Allowed quantity is one per month. Next fill date is 03/22/18.  Danley Danker, RN [facility] RN)

## 2018-03-05 ENCOUNTER — Ambulatory Visit: Payer: Medicare Other | Admitting: Emergency Medicine

## 2018-03-05 ENCOUNTER — Ambulatory Visit: Payer: Medicare Other | Admitting: Physical Therapy

## 2018-03-05 NOTE — Telephone Encounter (Signed)
Called and spoke with patient she is aware and verbalized understanding. Nothing further needed.  

## 2018-03-05 NOTE — Telephone Encounter (Signed)
Please let her know that I reviewed her chest x-ray.  It is stable without any evidence of any new lung involvement or lymph node enlargement due to sarcoidosis.  This is good news.

## 2018-03-06 ENCOUNTER — Ambulatory Visit: Payer: Medicare Other | Admitting: Rehabilitation

## 2018-03-06 ENCOUNTER — Encounter: Payer: Self-pay | Admitting: Rehabilitation

## 2018-03-06 DIAGNOSIS — M6281 Muscle weakness (generalized): Secondary | ICD-10-CM | POA: Diagnosis not present

## 2018-03-06 DIAGNOSIS — R2689 Other abnormalities of gait and mobility: Secondary | ICD-10-CM

## 2018-03-06 DIAGNOSIS — R2681 Unsteadiness on feet: Secondary | ICD-10-CM

## 2018-03-06 NOTE — Therapy (Signed)
[facility] [address] [place], [place], [address] Phone: [phone]   Fax:  [phone]  Physical Therapy Treatment  Patient Details  Name: Rebecca Hicks MRN: [id number] Date of Birth: 1945/09/07 Referring Provider (PT): Almedia Balls, MD   Encounter Date: 03/06/2018  PT End of Session - 03/06/18 1037    Visit Number  14    Number of Visits  17    Date for PT Re-Evaluation  03/10/18    Authorization Type  [employer] Medicare    PT Start Time  0845    PT Stop Time  0930    PT Time Calculation (min)  45 min    Activity Tolerance  Patient tolerated treatment well;Patient limited by fatigue   Patient limited by dehydration sx & experiencing of lightheadedness   Behavior During Therapy  [facility] for tasks assessed/performed       Past Medical History:  Diagnosis Date  . Abdominal pain 12/05/2011  . Abdominal pain, left upper quadrant 12/03/2012  . Acute on chronic renal failure ([place]) 12/25/2012  . Arthritis   . 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 Ataxia 12/15/2015  . Atypical chest pain 03/28/2016  . Cataract   . CERUMEN IMPACTION, RIGHT 01/31/2010   Qualifier: Diagnosis of  By: Dianah Field MD, Marcello Moores    . Chest tightness 02/23/2014  . Chronic kidney disease (CKD), stage II (mild) 03/05/2013   Looks like baseline creat = 1.2 -1.3.  Watch for overdiuresis.   . COLONIC POLYPS, ADENOMATOUS 05/03/2009   Every five year colonoscopy due to adenomatous polyp found 04/2009    . DDD (degenerative disc disease), cervical 11/24/2016  . Diabetes mellitus   . Diabetic neuropathy, type II diabetes mellitus ([place]) 05/28/2008   Qualifier: Diagnosis of  By: Andria Frames MD, Gwyndolyn Saxon    . Diarrhea 01/21/2015  . DM (diabetes mellitus), type 2, uncontrolled ([place]) 02/07/2007   Qualifier: Diagnosis of  By: Wynetta Emery RN, Doroteo Bradford    . 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 Essential hypertension 02/07/2007   Qualifier: Diagnosis of  By: Wynetta Emery RN, Doroteo Bradford    .  Excessive sleepiness 04/07/2014  . Fatigue 05/04/2011  . Glaucoma   . GLAUCOMA NOS 02/25/2009   Qualifier: Diagnosis of  By: Andria Frames MD, Gwyndolyn Saxon    . Great toe pain 09/18/2012  . History of nuclear stress test    Myoview 11/17: EF 64, Normal pharmacologic nuclear stress test with no evidence of prior infarct or ischemia.  Marland Kitchen Hoarseness 10/31/2016  . Hyperlipidemia   . Hypertension   . LOW BACK PAIN SYNDROME 05/28/2008   Qualifier: Diagnosis of  By: Andria Frames MD, Gwyndolyn Saxon    . Morbid obesity ([place])   . Neck pain 11/24/2016  . Neuromuscular disorder ([place])   . Obesity hypoventilation syndrome ([place]) 11/10/2015  . Obstructive sleep apnea 11/17/2015  . Otalgia of right ear 11/24/2016  . Polyneuropathy in diabetes(357.2)   . Sarcoidosis   . Sinusitis acute 10/03/2010  . 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 - She was fitted with a Small size [employer] Full Face Mask Simplus mask and heated humidification.   . Snoring 09/05/2015  . TB SKIN TEST, POSITIVE 02/07/2007   Annotation: with clear CXR mid 1990's Qualifier: History of  By: Milana Obey, Doroteo Bradford    . TOBACCO USE, QUIT 02/07/2007   Qualifier: Diagnosis of  By: Andria Frames MD, Gwyndolyn Saxon    .  Tremor of both hands 12/15/2015  . U R I 03/15/2010   Qualifier: Diagnosis of  By: Andria Frames MD, Gwyndolyn Saxon      Past Surgical History:  Procedure Laterality Date  . ABDOMINAL HYSTERECTOMY    . APPENDECTOMY    . CATARACT EXTRACTION, BILATERAL  03/2010, 04/2010  . COLONOSCOPY  2011  . EYE SURGERY    . POLYPECTOMY  2011   polyps, hems   . SPINE SURGERY     lumbar laminectomy X 2  . TONSILLECTOMY      There were no vitals filed for this visit.  Subjective Assessment - 03/06/18 0848    Subjective  Reports yesterday she went to a doctor appt. with her sister and had to walk far from the car to the building. Reports she walked 14 minutes  with her rollator and then after the appt. walked back to the car in 12 minutes and is proud of herself. Reports her knees did not bother her during ambulation trials. Her HEP continues to be going well at home.    Pertinent History  DM, HTN, falls, spinal stenosis of lumbar region, fibular fracture 6/3-mostly healed, has been getting injections in back    Limitations  Walking;Standing    Patient Stated Goals  "I want to be able to get some exercise to get this weight off so my back feels better."     Currently in Pain?  No/denies         [facility] PT Assessment - 03/06/18 0854      6 Minute Walk- Baseline   6 Minute Walk- Baseline  yes    BP (mmHg)  122/74    HR (bpm)  74    02 Sat (%RA)  91 %    Modified Borg Scale for Dyspnea  0- Nothing at all    Perceived Rate of Exertion (Borg)  6-      6 Minute walk- Post Test   6 Minute Walk Post Test  yes    BP (mmHg)  151/83    HR (bpm)  83    02 Sat (%RA)  98 %    Modified Borg Scale for Dyspnea  3- Moderate shortness of breath or breathing difficulty    Perceived Rate of Exertion (Borg)  13- Somewhat hard      6 minute walk test results    Aerobic Endurance Distance Walked  884      Standardized Balance Assessment   Standardized Balance Assessment  Dynamic Gait Index      Dynamic Gait Index   Level Surface  Mild Impairment    Change in Gait Speed  Mild Impairment    Gait with Horizontal Head Turns  Mild Impairment    Gait with Vertical Head Turns  Mild Impairment    Gait and Pivot Turn  Normal    Step Over Obstacle  Severe Impairment    Step Around Obstacles  Mild Impairment    Steps  Moderate Impairment    Total Score  14    DGI comment:  14/24 indicative of high fall risk potential       Skilled Physical Therapy Intervention:  Patient verbalizes and demonstrates understanding of the below updated corner balance exercises to perform at home to improve safety with functional mobility:    Exercises  Romberg Stance - 3 sets -  30 hold - 1x daily - 7x weekly  Romberg Stance with Eyes Closed - 3 sets - 30 hold - 1x daily - 7x  weekly  Romberg Stance with Head Rotation - 3 sets - 30 hold - 1x daily - 7x weekly  Romberg Stance on Foam Pad - 3 sets - 30 hold - 1x daily - 7x weekly  Romberg Stance Eyes Closed on Foam Pad - 3 sets - 30 hold - 1x daily - 7x weekly      PT Education - 03/06/18 1036    Education Details  Therapist provided education regarding clinical findings from LTG assessment and corner balance HEP exercises to perform at home to improve safety with functional mobility.     Person(s) Educated  Patient    Methods  Explanation    Comprehension  Verbalized understanding;Returned demonstration       PT Short Term Goals - 02/21/18 1042      PT SHORT TERM GOAL #1   Title  Pt will be independent with initial HEP in order to indicate improved functional mobility.  (Target Date: 02/14/18-goal date reflects 1 week delay in start)    Baseline  February 21, 2018- organized patients current HEP to be easier with comply with by stretching everday and strength exercises every other day.    Time  --    Period  --    Status  Achieved      PT SHORT TERM GOAL #2   Title  Will re-assess DGI and set appropriate LTG.     Baseline  02/21/18 15/24 indicating high fall risk     Time  --    Period  --    Status  Achieved      PT SHORT TERM GOAL #3   Title  Pt will be able to tolerate up to 6 minutes of activity without unsafe change in BP.     Baseline  02/21/18: pt's BP elevated with this activity    Time  --    Period  --    Status  Not Met      PT SHORT TERM GOAL #4   Title  Will assess 5TSS and set appropriate LTG to reflect improved LE strength and decreased fall risk.     Baseline  02/21/18 improved from baseline-today 16.75, baseline 2.4 sec    Time  --    Period  --    Status  Achieved      PT SHORT TERM GOAL #5   Title  Pt will ambulate x 300' w/ rollator at mod I level over indoor sufaces in order to  indicate safe indoor community mobility.      Baseline  02/21/18 met today    Status  Achieved      PT SHORT TERM GOAL #6   Status  New        PT Long Term Goals - 03/06/18 1038      PT LONG TERM GOAL #1   Title  Pt will be independent with final HEP and report transition to [employer] program in order to maintain mobility gains made in therapy.  (Target Date: 03/10/18-date reflects 1 week delay in start)    Time  6    Period  Weeks    Status  New      PT LONG TERM GOAL #2   Title  Pt will improve DGI >19/24 in order to indicate decreased fall risk.      Baseline  11/14: Pt performs DGI with no AD and scored 14/24 indicating improvement from previous assessment; however, not to goal level    Status  Not  Met      PT LONG TERM GOAL #3   Title  Pt will improve 6MWT w/ LRAD by 75' from baseline in order to indicate improved functional endurance.     Baseline  11/14: Pt performs 884 ft in 6 min duration and may have been limited 2/2 to busy environment and muscle cramping requiring x2 standing rest breaks.     Status  Not Met      PT LONG TERM GOAL #4   Title  Pt will ambulate up to 500' w/ LRAD over indoor/level and unlevel outdoor paved surfaces at mod I level in order to indicate safe community mobility.      Status  New      PT LONG TERM GOAL #5   Title  Pt will report return to shopping at farmer[employer] market and cooking at home in order to indicate functional improvement in endurance and standing tolerance.     Status  New      PT LONG TERM GOAL #6   Title  Pt will perform 5xSTS from standard height chair using B UE's for assistance in </= 15 seconds indicating reduction in fall risk potential and improvement in LE power     Baseline  10/11: 22.4 seconds from standard height chair using B UE's for assist     Status  New            Plan - 03/06/18 1041    Clinical Impression Statement  Skilled session focused on beginning to assess LTG's to establish patient  progress with physical therapy and initiating corner balance HEP to improve safety with functional mobility. Pt demonstrates improvement in 6 minute walk test; however, not to goal level. Patient may have been able to ambulate further distances with rollator; however, Pt demonstrated inc difficulty navigating busy clinic environment and experienced multiple episodes of LE cramping requiring standing rest breaks. Therapist educated patient regarding the importance of hydration to reduce muscle cramping and lightheadedness. Pt DGI score improved to 14/24 with no AD; however, not improved to goal level. At this time, patient would benefit from recertification for more physical therapy visits to continue progressing her towards achieving LTG's and improving safety with functional mobility.      Rehab Potential  Good    Clinical Impairments Affecting Rehab Potential  co-morbidities including spinal stenosis and B knee pain    PT Frequency  2x / week    PT Duration  8 weeks    PT Treatment/Interventions  ADLs/Self Care Home Management;DME Instruction;Gait training;Stair training;Functional mobility training;Therapeutic activities;Therapeutic exercise;Balance training;Neuromuscular re-education;Patient/family education;Orthotic Fit/Training;Passive range of motion;Energy conservation;Vestibular    PT Next Visit Plan  check compliance with added HEP for ITB issues, continue to work on endurance/strengthening with Scifit/Nustep, balance activities with decr UE support, outdoors as weather permits, and bil LE [place]  [id number]     Consulted and Agree with Plan of Care  Patient       Patient will benefit from skilled therapeutic intervention in order to improve the following deficits and impairments:  Abnormal gait, Decreased activity tolerance, Decreased balance, Decreased endurance, Decreased knowledge of use of DME, Decreased mobility, Decreased range of motion, Decreased strength,  Difficulty walking, Impaired perceived functional ability, Impaired flexibility, Impaired sensation, Postural dysfunction, Obesity  Visit Diagnosis: Muscle weakness (generalized)  Unsteadiness on feet  Other abnormalities of gait and mobility     Problem List Patient Active Problem List   Diagnosis Date Noted  .  Shingles 10/31/2017  . Estrogen deficiency 10/31/2017  . Diabetic retinopathy ([place]) 10/28/2017  . Mixed stress and urge urinary incontinence 10/03/2017  . Closed right fibular fracture 10/03/2017  . Dependent edema 10/03/2017  . Lumbosacral radiculopathy 09/20/2017  . Diabetic polyneuropathy associated with type 2 diabetes mellitus ([place]) 09/20/2017  . Ulnar neuropathy at elbow of left upper extremity 09/20/2017  . Coronary artery calcification 02/28/2017  . Neck pain 11/24/2016  . Otalgia of right ear 11/24/2016  . DDD (degenerative disc disease), cervical 11/24/2016  . Arthropathy 11/24/2016  . Left arm numbness 10/31/2016  . Hoarseness 10/31/2016  . Atypical chest pain 03/28/2016  . Tremor of both hands 12/15/2015  . Ataxia 12/15/2015  . Obstructive sleep apnea 11/17/2015  . 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 Periodic limb movements of sleep 11/10/2015  . Obesity hypoventilation syndrome ([place]) 11/10/2015  . Chronic kidney disease (CKD), stage II (mild) 03/05/2013  . COLONIC POLYPS, ADENOMATOUS 05/03/2009  . GLAUCOMA NOS 02/25/2009  . Diabetic neuropathy, type II diabetes mellitus ([place]) 05/28/2008  . LOW BACK PAIN SYNDROME 05/28/2008  . 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 DM (diabetes mellitus), type 2, uncontrolled ([place]) 02/07/2007  . Hyperlipidemia 02/07/2007  . Morbid obesity ([place]) 02/07/2007  . Essential hypertension 02/07/2007  . TB SKIN TEST, POSITIVE 02/07/2007  . TOBACCO USE, QUIT 02/07/2007  . HYSTERECTOMY, HX OF 02/07/2007    Floreen Comber, SPT 03/06/2018, 10:45 AM  [facility] [address] [place], [place], [address] Phone: [phone]   Fax:  [phone]  Name: Rebecca Hicks MRN: [id number] Date of Birth: June 02, 1945

## 2018-03-06 NOTE — Patient Instructions (Signed)
Access Code: [email or web address]  URL: [email or web address]  Date: 03/06/2018  Prepared by: Floreen Comber   Exercises  Romberg Stance - 3 sets - 30 hold - 1x daily - 7x weekly  Romberg Stance with Eyes Closed - 3 sets - 30 hold - 1x daily - 7x weekly  Romberg Stance with Head Rotation - 3 sets - 30 hold - 1x daily - 7x weekly  Romberg Stance on Foam Pad - 3 sets - 30 hold - 1x daily - 7x weekly  Romberg Stance Eyes Closed on Foam Pad - 3 sets - 30 hold - 1x daily - 7x weekly

## 2018-03-11 ENCOUNTER — Ambulatory Visit: Payer: Medicare Other | Admitting: Rehabilitation

## 2018-03-14 ENCOUNTER — Ambulatory Visit: Payer: Medicare Other | Admitting: Physical Therapy

## 2018-03-14 ENCOUNTER — Encounter: Payer: Self-pay | Admitting: Physical Therapy

## 2018-03-14 DIAGNOSIS — M6281 Muscle weakness (generalized): Secondary | ICD-10-CM

## 2018-03-14 DIAGNOSIS — R2681 Unsteadiness on feet: Secondary | ICD-10-CM

## 2018-03-14 DIAGNOSIS — R208 Other disturbances of skin sensation: Secondary | ICD-10-CM

## 2018-03-14 DIAGNOSIS — R2689 Other abnormalities of gait and mobility: Secondary | ICD-10-CM

## 2018-03-14 NOTE — Therapy (Signed)
[facility] [address] [place], [place], [address] Phone: [phone]   Fax:  [phone]  Physical Therapy Treatment  Patient Details  Name: Rebecca Hicks MRN: [id number] Date of Birth: [birth year] Referring Provider (PT): Almedia Balls, MD   Encounter Date: 03/14/2018  PT End of Session - 03/14/18 1039    Visit Number  15    Number of Visits  22    Date for PT Re-Evaluation  04/05/18    Authorization Type  [employer] Medicare    PT Start Time  1024    PT Stop Time  1103    PT Time Calculation (min)  39 min    Activity Tolerance  Patient tolerated treatment well    Behavior During Therapy  [facility] for tasks assessed/performed       Past Medical History:  Diagnosis Date  . Abdominal pain 12/05/2011  . Abdominal pain, left upper quadrant 12/03/2012  . Acute on chronic renal failure ([place]) 12/25/2012  . Arthritis   . 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 Ataxia 12/15/2015  . Atypical chest pain 03/28/2016  . Cataract   . CERUMEN IMPACTION, RIGHT 01/31/2010   Qualifier: Diagnosis of  By: Dianah Field MD, Marcello Moores    . Chest tightness 02/23/2014  . Chronic kidney disease (CKD), stage II (mild) 03/05/2013   Looks like baseline creat = 1.2 -1.3.  Watch for overdiuresis.   . COLONIC POLYPS, ADENOMATOUS 05/03/2009   Every five year colonoscopy due to adenomatous polyp found 04/2009    . DDD (degenerative disc disease), cervical 11/24/2016  . Diabetes mellitus   . Diabetic neuropathy, type II diabetes mellitus ([place]) 05/28/2008   Qualifier: Diagnosis of  By: Andria Frames MD, Gwyndolyn Saxon    . Diarrhea 01/21/2015  . DM (diabetes mellitus), type 2, uncontrolled ([place]) 02/07/2007   Qualifier: Diagnosis of  By: Wynetta Emery RN, Doroteo Bradford    . 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 Essential hypertension 02/07/2007   Qualifier: Diagnosis of  By: Wynetta Emery RN, Doroteo Bradford    . Excessive sleepiness 04/07/2014  . Fatigue 05/04/2011  . Glaucoma   . GLAUCOMA NOS 02/25/2009   Qualifier: Diagnosis of  By: Andria Frames MD, Gwyndolyn Saxon    . Great toe pain 09/18/2012  . History of nuclear stress test    Myoview 11/17: EF 64, Normal pharmacologic nuclear stress test with no evidence of prior infarct or ischemia.  Marland Kitchen Hoarseness 10/31/2016  . Hyperlipidemia   . Hypertension   . LOW BACK PAIN SYNDROME 05/28/2008   Qualifier: Diagnosis of  By: Andria Frames MD, Gwyndolyn Saxon    . Morbid obesity ([place])   . Neck pain 11/24/2016  . Neuromuscular disorder ([place])   . Obesity hypoventilation syndrome ([place]) 11/10/2015  . Obstructive sleep apnea 11/17/2015  . Otalgia of right ear 11/24/2016  . Polyneuropathy in diabetes(357.2)   . Sarcoidosis   . Sinusitis acute 10/03/2010  . 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 - She was fitted with a Small size [employer] Full Face Mask Simplus mask and heated humidification.   . Snoring 09/05/2015  . TB SKIN TEST, POSITIVE 02/07/2007   Annotation: with clear CXR mid 1990's Qualifier: History of  By: Milana Obey, Doroteo Bradford    . TOBACCO USE, QUIT 02/07/2007   Qualifier: Diagnosis of  By: Andria Frames MD, Gwyndolyn Saxon    . Tremor of both hands 12/15/2015  . U R I 03/15/2010  Qualifier: Diagnosis of  By: Andria Frames MD, Gwyndolyn Saxon      Past Surgical History:  Procedure Laterality Date  . ABDOMINAL HYSTERECTOMY    . APPENDECTOMY    . CATARACT EXTRACTION, BILATERAL  03/2010, 04/2010  . COLONOSCOPY  2011  . EYE SURGERY    . POLYPECTOMY  2011   polyps, hems   . SPINE SURGERY     lumbar laminectomy X 2  . TONSILLECTOMY      There were no vitals filed for this visit.  Subjective Assessment - 03/14/18 1025    Subjective  feels like she has made improvements and family can notice progress. Feels fatigued - unsure of reason - has expressed this to MD, lab work done - reports glucose has been out of whack recently; feels like she has a lot of stress recently     Pertinent History  DM, HTN, falls, spinal stenosis of lumbar region, fibular fracture 6/3-mostly healed, has been getting injections in back    Limitations  Walking;Standing    Patient Stated Goals  "I want to be able to get some exercise to get this weight off so my back feels better."     Currently in Pain?  No/denies                            Balance Exercises - 03/14/18 1110      Balance Exercises: Standing   Standing Eyes Opened  Narrow base of support (BOS);1 rep;30 secs    Standing Eyes Closed  Narrow base of support (BOS);Head turns;3 reps;20 secs   static, head turns, head nods in progression   Tandem Stance  Eyes open   mod. 3/4 step: static, head turns, head nods in progression   Rockerboard  Anterior/posterior;Head turns;EO;5 reps;20 seconds   static, head turns, head nods - very little UE support at //   Balance Beam  blue beam: static stance; forward taps to 6" step; cross over taps to 6" step - all with light B UE support at // bars          PT Short Term Goals - 02/21/18 1042      PT SHORT TERM GOAL #1   Title  Pt will be independent with initial HEP in order to indicate improved functional mobility.  (Target Date: 02/14/18-goal date reflects 1 week delay in start)    Baseline  February 21, 2018- organized patients current HEP to be easier with comply with by stretching everday and strength exercises every other day.    Time  --    Period  --    Status  Achieved      PT SHORT TERM GOAL #2   Title  Will re-assess DGI and set appropriate LTG.     Baseline  02/21/18 15/24 indicating high fall risk     Time  --    Period  --    Status  Achieved      PT SHORT TERM GOAL #3   Title  Pt will be able to tolerate up to 6 minutes of activity without unsafe change in BP.     Baseline  02/21/18: pt's BP elevated with this activity    Time  --    Period  --    Status  Not Met      PT SHORT TERM GOAL #4   Title  Will assess 5TSS and set appropriate  LTG to reflect improved LE strength and  decreased fall risk.     Baseline  02/21/18 improved from baseline-today 16.75, baseline 2.4 sec    Time  --    Period  --    Status  Achieved      PT SHORT TERM GOAL #5   Title  Pt will ambulate x 300' w/ rollator at mod I level over indoor sufaces in order to indicate safe indoor community mobility.      Baseline  02/21/18 met today    Status  Achieved      PT SHORT TERM GOAL #6   Status  New        PT Long Term Goals - 03/06/18 1038      PT LONG TERM GOAL #1   Title  Pt will be independent with final HEP and report transition to [employer] program in order to maintain mobility gains made in therapy.  (Target Date: 04/05/18)    Time  4    Period  Weeks    Status  On-going    Target Date  04/05/18      PT LONG TERM GOAL #2   Title  Pt will improve DGI >19/24 with LRAD in order to indicate decreased fall risk.      Baseline  11/14: Pt performs DGI with no AD and scored 14/24 indicating improvement from previous assessment; however, not to goal level    Time  4    Period  Weeks    Status  New    Target Date  04/05/18      PT LONG TERM GOAL #3   Title  Pt will improve 6MWT w/ LRAD by 73' in order to indicate improved functional endurance using rollator.     Baseline  11/14: Pt performs 884 ft in 6 min duration and may have been limited 2/2 to busy environment and muscle cramping requiring x2 standing rest breaks.     Time  4    Period  Weeks    Status  New    Target Date  04/05/18      PT LONG TERM GOAL #4   Title  Pt will ambulate up to 500' w/ LRAD over indoor/level and unlevel outdoor paved surfaces at mod I level in order to indicate safe community mobility.      Time  4    Period  Weeks    Status  On-going    Target Date  04/05/18      PT LONG TERM GOAL #5   Title  Pt will report return to shopping at farmer's market and cooking at home in order to indicate functional improvement in endurance and standing tolerance.      Time  4    Period  Weeks    Status  On-going    Target Date  04/05/18      PT LONG TERM GOAL #6   Title  Pt will perform 5xSTS from standard height chair using B UE's for assistance in </= 15 seconds indicating reduction in fall risk potential and improvement in LE power     Baseline  10/11: 22.4 seconds from standard height chair using B UE's for assist     Status  On-going            Plan - 03/14/18 1113    Clinical Impression Statement  Yuna doing well today - reports she and family are seeing improvements in her mobility. Session focusing on balance activities as she feels this is her area  of greatest concern. Does require 3 seated rest breaks in sesion due to LE fatigue. Patient doing well at challenging balance and allowing herself not to require UE support at times. Will conitnue to progress towards goals.     Rehab Potential  Good    Clinical Impairments Affecting Rehab Potential  co-morbidities including spinal stenosis and B knee pain    PT Frequency  2x / week    PT Duration  4 weeks    PT Treatment/Interventions  ADLs/Self Care Home Management;DME Instruction;Gait training;Stair training;Functional mobility training;Therapeutic activities;Therapeutic exercise;Balance training;Neuromuscular re-education;Patient/family education;Orthotic Fit/Training;Passive range of motion;Energy conservation;Vestibular    PT Next Visit Plan  check compliance with added HEP for ITB issues, continue to work on endurance/strengthening with Scifit/Nustep, balance activities with decr UE support, outdoors as weather permits, and bil LE [place]  [id number]     Consulted and Agree with Plan of Care  Patient       Patient will benefit from skilled therapeutic intervention in order to improve the following deficits and impairments:  Abnormal gait, Decreased activity tolerance, Decreased balance, Decreased endurance, Decreased knowledge of use of DME, Decreased  mobility, Decreased range of motion, Decreased strength, Difficulty walking, Impaired perceived functional ability, Impaired flexibility, Impaired sensation, Postural dysfunction, Obesity  Visit Diagnosis: Muscle weakness (generalized)  Unsteadiness on feet  Other abnormalities of gait and mobility  Other disturbances of skin sensation     Problem List Patient Active Problem List   Diagnosis Date Noted  . Shingles 10/31/2017  . Estrogen deficiency 10/31/2017  . Diabetic retinopathy ([place]) 10/28/2017  . Mixed stress and urge urinary incontinence 10/03/2017  . Closed right fibular fracture 10/03/2017  . Dependent edema 10/03/2017  . Lumbosacral radiculopathy 09/20/2017  . Diabetic polyneuropathy associated with type 2 diabetes mellitus ([place]) 09/20/2017  . Ulnar neuropathy at elbow of left upper extremity 09/20/2017  . Coronary artery calcification 02/28/2017  . Neck pain 11/24/2016  . Otalgia of right ear 11/24/2016  . DDD (degenerative disc disease), cervical 11/24/2016  . Arthropathy 11/24/2016  . Left arm numbness 10/31/2016  . Hoarseness 10/31/2016  . Atypical chest pain 03/28/2016  . Tremor of both hands 12/15/2015  . Ataxia 12/15/2015  . Obstructive sleep apnea 11/17/2015  . 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 Periodic limb movements of sleep 11/10/2015  . Obesity hypoventilation syndrome ([place]) 11/10/2015  . Chronic kidney disease (CKD), stage II (mild) 03/05/2013  . COLONIC POLYPS, ADENOMATOUS 05/03/2009  . GLAUCOMA NOS 02/25/2009  . Diabetic neuropathy, type II diabetes mellitus ([place]) 05/28/2008  . LOW BACK PAIN SYNDROME 05/28/2008  . 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 DM (diabetes mellitus), type 2, uncontrolled ([place]) 02/07/2007  . Hyperlipidemia 02/07/2007  . Morbid obesity ([place]) 02/07/2007  . Essential hypertension 02/07/2007  . TB SKIN TEST, POSITIVE 02/07/2007  . TOBACCO USE, QUIT 02/07/2007  .  HYSTERECTOMY, HX OF 02/07/2007     Lanney Gins, PT, DPT Supplemental Physical Therapist 03/14/18 11:16 AM Pager: [phone] Office: [facility] [address] [place], [place], [address] Phone: [phone]   Fax:  [phone]  Name: Rebecca Hicks MRN: [id number] Date of Birth: November 19, 1945

## 2018-03-17 ENCOUNTER — Telehealth: Payer: Self-pay | Admitting: Pharmacist

## 2018-03-17 ENCOUNTER — Ambulatory Visit: Payer: Medicare Other | Admitting: Pharmacist

## 2018-03-17 DIAGNOSIS — E11649 Type 2 diabetes mellitus with hypoglycemia without coma: Secondary | ICD-10-CM

## 2018-03-17 DIAGNOSIS — E7800 Pure hypercholesterolemia, unspecified: Secondary | ICD-10-CM

## 2018-03-17 MED ORDER — ROSUVASTATIN CALCIUM 40 MG PO TABS: 40.0000 mg | ORAL_TABLET | Freq: Every day | ORAL | 11 refills | Status: DC

## 2018-03-17 NOTE — Telephone Encounter (Signed)
Called pt to f/u with rosuvastatin 20mg  daily tolerability. Pt reports no adverse effects and is willing to increase her dose to 40mg  daily. Scheduled f/u lab work in 2-3 months to assess efficacy and advised pt to call clinic if she develops any side effects prior to then.

## 2018-03-17 NOTE — Telephone Encounter (Signed)
Noted and agree. 

## 2018-03-17 NOTE — Telephone Encounter (Signed)
Called in  f/u of missed appt. today and lab changes with recent test.   Patient appologized for missing appointment and states she tried to call (unable to get through and  left a message).   Patient made aware of elevation in renal lab (creatinine elevation of ~30% with most recent lab change following initiation of Jardiance (empagliflozin) earlier this year).   I informed her I would like to get another set of labs at a follow-up visit with either Dr. Andria Frames or myself in the near future.  She was asked to reschedule with either PCP or in Rx Clinic.   Notes for Follow/Up Empagliflozin (Jardiance) If the f/u Scr is 1.6 or less  - suggest continuing same dose  If the f/u Scr is 1.61-1.8  - dose reduce from 25mg  to 10mg  If her f/u Scr returns higher than 1.8  - stop drug, recheck labs after stopping for 1 week

## 2018-03-19 ENCOUNTER — Ambulatory Visit: Payer: Medicare Other | Admitting: Physical Therapy

## 2018-03-25 ENCOUNTER — Ambulatory Visit: Payer: Medicare Other | Attending: Orthopedic Surgery

## 2018-03-25 DIAGNOSIS — R208 Other disturbances of skin sensation: Secondary | ICD-10-CM | POA: Diagnosis present

## 2018-03-25 DIAGNOSIS — R2681 Unsteadiness on feet: Secondary | ICD-10-CM

## 2018-03-25 DIAGNOSIS — R2689 Other abnormalities of gait and mobility: Secondary | ICD-10-CM | POA: Insufficient documentation

## 2018-03-25 DIAGNOSIS — M6281 Muscle weakness (generalized): Secondary | ICD-10-CM | POA: Diagnosis not present

## 2018-03-25 NOTE — Therapy (Signed)
[facility] [address] [place], [place], [address] Phone: [phone]   Fax:  [phone]  Physical Therapy Treatment  Patient Details  Name: Rebecca Hicks MRN: [id number] Date of Birth: January 18, 1946 Referring Provider (PT): Almedia Balls, MD   Encounter Date: 03/25/2018  PT End of Session - 03/25/18 1111    Visit Number  16    Number of Visits  22    Date for PT Re-Evaluation  04/05/18    Authorization Type  [employer] Medicare    PT Start Time  1100    PT Stop Time  1146    PT Time Calculation (min)  46 min    Equipment Utilized During Treatment  Gait belt    Activity Tolerance  Patient tolerated treatment well    Behavior During Therapy  [facility] for tasks assessed/performed       Past Medical History:  Diagnosis Date  . Abdominal pain 12/05/2011  . Abdominal pain, left upper quadrant 12/03/2012  . Acute on chronic renal failure ([place]) 12/25/2012  . Arthritis   . 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 Ataxia 12/15/2015  . Atypical chest pain 03/28/2016  . Cataract   . CERUMEN IMPACTION, RIGHT 01/31/2010   Qualifier: Diagnosis of  By: Dianah Field MD, Marcello Moores    . Chest tightness 02/23/2014  . Chronic kidney disease (CKD), stage II (mild) 03/05/2013   Looks like baseline creat = 1.2 -1.3.  Watch for overdiuresis.   . COLONIC POLYPS, ADENOMATOUS 05/03/2009   Every five year colonoscopy due to adenomatous polyp found 04/2009    . DDD (degenerative disc disease), cervical 11/24/2016  . Diabetes mellitus   . Diabetic neuropathy, type II diabetes mellitus ([place]) 05/28/2008   Qualifier: Diagnosis of  By: Andria Frames MD, Gwyndolyn Saxon    . Diarrhea 01/21/2015  . DM (diabetes mellitus), type 2, uncontrolled ([place]) 02/07/2007   Qualifier: Diagnosis of  By: Wynetta Emery RN, Doroteo Bradford    . 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 Essential hypertension 02/07/2007   Qualifier: Diagnosis of  By: Wynetta Emery RN, Doroteo Bradford    . Excessive sleepiness 04/07/2014  . Fatigue  05/04/2011  . Glaucoma   . GLAUCOMA NOS 02/25/2009   Qualifier: Diagnosis of  By: Andria Frames MD, Gwyndolyn Saxon    . Great toe pain 09/18/2012  . History of nuclear stress test    Myoview 11/17: EF 64, Normal pharmacologic nuclear stress test with no evidence of prior infarct or ischemia.  Marland Kitchen Hoarseness 10/31/2016  . Hyperlipidemia   . Hypertension   . LOW BACK PAIN SYNDROME 05/28/2008   Qualifier: Diagnosis of  By: Andria Frames MD, Gwyndolyn Saxon    . Morbid obesity ([place])   . Neck pain 11/24/2016  . Neuromuscular disorder ([place])   . Obesity hypoventilation syndrome ([place]) 11/10/2015  . Obstructive sleep apnea 11/17/2015  . Otalgia of right ear 11/24/2016  . Polyneuropathy in diabetes(357.2)   . Sarcoidosis   . Sinusitis acute 10/03/2010  . 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 - She was fitted with a Small size [employer] Full Face Mask Simplus mask and heated humidification.   . Snoring 09/05/2015  . TB SKIN TEST, POSITIVE 02/07/2007   Annotation: with clear CXR mid 1990's Qualifier: History of  By: Milana Obey, Doroteo Bradford    . TOBACCO USE, QUIT 02/07/2007   Qualifier: Diagnosis of  By: Andria Frames MD, Gwyndolyn Saxon    .  Tremor of both hands 12/15/2015  . U R I 03/15/2010   Qualifier: Diagnosis of  By: Andria Frames MD, Gwyndolyn Saxon      Past Surgical History:  Procedure Laterality Date  . ABDOMINAL HYSTERECTOMY    . APPENDECTOMY    . CATARACT EXTRACTION, BILATERAL  03/2010, 04/2010  . COLONOSCOPY  2011  . EYE SURGERY    . POLYPECTOMY  2011   polyps, hems   . SPINE SURGERY     lumbar laminectomy X 2  . TONSILLECTOMY      There were no vitals filed for this visit.  Subjective Assessment - 03/25/18 1102    Subjective  Pt reports increased pain and weaknedd in BLE's, pt states that DR. has been increasing statins which has caused muscle pain in the past, pt decided to stop taking them to see if that  helps. Pt stopped 5 days ago with a small decreasce in pain. Pt states that she was at the grocery store 11/27 and had a near fall, felt like her legs were about to give out on her.     Pertinent History  DM, HTN, falls, spinal stenosis of lumbar region, fibular fracture 6/3-mostly healed, has been getting injections in back    Limitations  Walking;Standing    Patient Stated Goals  "I want to be able to get some exercise to get this weight off so my back feels better."     Currently in Pain?  Yes    Pain Score  6     Pain Location  Leg    Pain Orientation  Right;Left    Pain Descriptors / Indicators  Aching;Discomfort;Other (Comment);Sharp   Weakness   Pain Type  Acute pain    Pain Onset  1 to 4 weeks ago    Pain Frequency  Constant    Pain Relieving Factors  2 advil/2 tylenol every 4 to 5 hrs    Multiple Pain Sites  No        [facility] Adult PT Treatment/Exercise - 03/25/18 1112      Ambulation/Gait   Ambulation/Gait  Yes    Ambulation/Gait Assistance  5: Supervision    Ambulation/Gait Assistance Details  Scanning for objects while reaching up/down/across with rollator to simulate grocery shopping. No LOB, minimal deviation from path. Pt became fatigue toward end of gait training.     Ambulation Distance (Feet)  350 Feet    Assistive device  Rollator    Gait Pattern  Step-through pattern;Decreased stride length;Trunk flexed;Narrow base of support;Right foot flat;Left foot flat;Antalgic    Ambulation Surface  Level;Indoor      Knee/Hip Exercises: Aerobic   Nustep  L1, 48mns, pt reports decrease in BLE pain while/after using Nustep. minimal use of BUE's.         PT Short Term Goals - 02/21/18 1042      PT SHORT TERM GOAL #1   Title  Pt will be independent with initial HEP in order to indicate improved functional mobility.  (Target Date: 02/14/18-goal date reflects 1 week delay in start)    Baseline  February 21, 2018- organized patients current HEP to be easier with comply with by  stretching everday and strength exercises every other day.    Time  --    Period  --    Status  Achieved      PT SHORT TERM GOAL #2   Title  Will re-assess DGI and set appropriate LTG.     Baseline  02/21/18 15/24 indicating high  fall risk     Time  --    Period  --    Status  Achieved      PT SHORT TERM GOAL #3   Title  Pt will be able to tolerate up to 6 minutes of activity without unsafe change in BP.     Baseline  02/21/18: pt's BP elevated with this activity    Time  --    Period  --    Status  Not Met      PT SHORT TERM GOAL #4   Title  Will assess 5TSS and set appropriate LTG to reflect improved LE strength and decreased fall risk.     Baseline  02/21/18 improved from baseline-today 16.75, baseline 2.4 sec    Time  --    Period  --    Status  Achieved      PT SHORT TERM GOAL #5   Title  Pt will ambulate x 300' w/ rollator at mod I level over indoor sufaces in order to indicate safe indoor community mobility.      Baseline  02/21/18 met today    Status  Achieved      PT SHORT TERM GOAL #6   Status  New        PT Long Term Goals - 03/06/18 1038      PT LONG TERM GOAL #1   Title  Pt will be independent with final HEP and report transition to [employer] program in order to maintain mobility gains made in therapy.  (Target Date: 04/05/18)    Time  4    Period  Weeks    Status  On-going    Target Date  04/05/18      PT LONG TERM GOAL #2   Title  Pt will improve DGI >19/24 with LRAD in order to indicate decreased fall risk.      Baseline  11/14: Pt performs DGI with no AD and scored 14/24 indicating improvement from previous assessment; however, not to goal level    Time  4    Period  Weeks    Status  New    Target Date  04/05/18      PT LONG TERM GOAL #3   Title  Pt will improve 6MWT w/ LRAD by 73' in order to indicate improved functional endurance using rollator.     Baseline  11/14: Pt performs 884 ft in 6 min duration and may have been limited 2/2 to  busy environment and muscle cramping requiring x2 standing rest breaks.     Time  4    Period  Weeks    Status  New    Target Date  04/05/18      PT LONG TERM GOAL #4   Title  Pt will ambulate up to 500' w/ LRAD over indoor/level and unlevel outdoor paved surfaces at mod I level in order to indicate safe community mobility.      Time  4    Period  Weeks    Status  On-going    Target Date  04/05/18      PT LONG TERM GOAL #5   Title  Pt will report return to shopping at farmer's market and cooking at home in order to indicate functional improvement in endurance and standing tolerance.     Time  4    Period  Weeks    Status  On-going    Target Date  04/05/18  PT LONG TERM GOAL #6   Title  Pt will perform 5xSTS from standard height chair using B UE's for assistance in </= 15 seconds indicating reduction in fall risk potential and improvement in LE power     Baseline  10/11: 22.4 seconds from standard height chair using B UE's for assist     Status  On-going         Plan - 03/25/18 1252    Clinical Impression Statement  Todays skilled session focused on gait training while simulating grocery shopping, and Nustep on L1/75mns for BLE strengthening with pt reporting decrease in BLE pain during/after use. Pt should benefit from continued PT sessions to progress towards goals.     Rehab Potential  Good    Clinical Impairments Affecting Rehab Potential  co-morbidities including spinal stenosis and B knee pain    PT Frequency  2x / week    PT Duration  4 weeks    PT Treatment/Interventions  ADLs/Self Care Home Management;DME Instruction;Gait training;Stair training;Functional mobility training;Therapeutic activities;Therapeutic exercise;Balance training;Neuromuscular re-education;Patient/family education;Orthotic Fit/Training;Passive range of motion;Energy conservation;Vestibular    PT Next Visit Plan  Nustep, check compliance with added HEP for ITB issues, continue to work on  endurance/strengthening with Scifit, balance activities with decr UE support, outdoors as weather permits, and bil LE [place] [id number]    Consulted and Agree with Plan of Care  Patient       Patient will benefit from skilled therapeutic intervention in order to improve the following deficits and impairments:  Abnormal gait, Decreased activity tolerance, Decreased balance, Decreased endurance, Decreased knowledge of use of DME, Decreased mobility, Decreased range of motion, Decreased strength, Difficulty walking, Impaired perceived functional ability, Impaired flexibility, Impaired sensation, Postural dysfunction, Obesity  Visit Diagnosis: Muscle weakness (generalized)  Unsteadiness on feet  Other abnormalities of gait and mobility     Problem List Patient Active Problem List   Diagnosis Date Noted  . Shingles 10/31/2017  . Estrogen deficiency 10/31/2017  . Diabetic retinopathy ([place] 10/28/2017  . Mixed stress and urge urinary incontinence 10/03/2017  . Closed right fibular fracture 10/03/2017  . Dependent edema 10/03/2017  . Lumbosacral radiculopathy 09/20/2017  . Diabetic polyneuropathy associated with type 2 diabetes mellitus ([place] 09/20/2017  . Ulnar neuropathy at elbow of left upper extremity 09/20/2017  . Coronary artery calcification 02/28/2017  . Neck pain 11/24/2016  . Otalgia of right ear 11/24/2016  . DDD (degenerative disc disease), cervical 11/24/2016  . Arthropathy 11/24/2016  . Left arm numbness 10/31/2016  . Hoarseness 10/31/2016  . Atypical chest pain 03/28/2016  . Tremor of both hands 12/15/2015  . Ataxia 12/15/2015  . Obstructive sleep apnea 11/17/2015  . 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 Periodic limb movements of sleep 11/10/2015  . Obesity hypoventilation syndrome ([place] 11/10/2015  . Chronic kidney disease (CKD), stage II (mild) 03/05/2013  . COLONIC POLYPS, ADENOMATOUS  05/03/2009  . GLAUCOMA NOS 02/25/2009  . Diabetic neuropathy, type II diabetes mellitus ([place] 05/28/2008  . LOW BACK PAIN SYNDROME 05/28/2008  . 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 DM (diabetes mellitus), type 2, uncontrolled ([place] 02/07/2007  . Hyperlipidemia 02/07/2007  . Morbid obesity ([place] 02/07/2007  . Essential hypertension 02/07/2007  . TB SKIN TEST, POSITIVE 02/07/2007  . TOBACCO USE, QUIT 02/07/2007  . HYSTERECTOMY, HX OF 02/07/2007   Joeseph Verville, PTA  Kebrina Friend A Earnie Bechard 03/25/2018, 12:59 PM  [id number]  [place], [place], [address] Phone: [phone]   Fax:  [phone]  Name: ELLEEN COULIBALY MRN: [id number] Date of Birth: Oct 30, 1945

## 2018-03-27 ENCOUNTER — Encounter: Payer: Self-pay | Admitting: Pharmacist

## 2018-03-27 ENCOUNTER — Ambulatory Visit: Payer: Medicare Other | Admitting: Pharmacist

## 2018-03-27 ENCOUNTER — Encounter: Payer: Self-pay | Admitting: Gastroenterology

## 2018-03-27 DIAGNOSIS — E11649 Type 2 diabetes mellitus with hypoglycemia without coma: Secondary | ICD-10-CM | POA: Diagnosis not present

## 2018-03-27 DIAGNOSIS — I10 Essential (primary) hypertension: Secondary | ICD-10-CM | POA: Diagnosis not present

## 2018-03-27 DIAGNOSIS — M5440 Lumbago with sciatica, unspecified side: Secondary | ICD-10-CM | POA: Diagnosis not present

## 2018-03-27 DIAGNOSIS — E7800 Pure hypercholesterolemia, unspecified: Secondary | ICD-10-CM | POA: Diagnosis not present

## 2018-03-27 DIAGNOSIS — G8929 Other chronic pain: Secondary | ICD-10-CM

## 2018-03-27 MED ORDER — DULOXETINE HCL 60 MG PO CPEP: 60.0000 mg | ORAL_CAPSULE | Freq: Every day | ORAL | 3 refills | Status: DC

## 2018-03-27 MED ORDER — INSULIN DEGLUDEC 100 UNIT/ML ~~LOC~~ SOPN: 40.0000 [IU] | PEN_INJECTOR | Freq: Every day | SUBCUTANEOUS | Status: AC

## 2018-03-27 NOTE — Assessment & Plan Note (Signed)
Diabetes longstanding, with recent improved control and two episodes of nocturnal hypoglycemia.  Recently had worsening pain likely due to increase statin dosing - resolving now. Patient was able to verbalize appropriate hypoglycemia management plan. Patient is adherent with medication. Control is suboptimal due to pain, and sedentary lifestyle. -Continued basal insulin Tresiba (insulin degludec) at 40units daily  -Adjusted dose of  rapid insulin Humalog (insulin lispro) to allow for sliding scale reduction when she perceives potential for low carbohydrate intake (with a salad).  She has done this in the past with her "grandson's insulin".   -Continued GLP-1 Victoza (generic name liraglutide) at 1.8mg  daily.  -Continued SGLT2-I Jardiance (generic name empagliflozin) at 25mg  once daily. Recheck BMET today to evaluate renal function with use of Jardiance.  We discussed how stopping her liberal use of ibuprofen would be the first step if her creatinine level has remained elevated.  Then we could consider dose reduction of Jardiance to 10mg  once daily.  -Extensively discussed pathophysiology of DM, recommended lifestyle interventions, dietary effects on glycemic control -Counseled on s/sx of and management of hypoglycemia -Next A1C anticipated in early 2020.

## 2018-03-27 NOTE — Progress Notes (Signed)
S:     Chief Complaint  Patient presents with  . Medication Management    diabetes    Patient arrives in good spirits and ambulating slowly without assistance.  It is noteworthy that she needed to take multiple attempts to rise from her seated position in the waiting room.  She notes that her aches/pains worsened with increasing rosuvastatin dose from 10 to 20 mg daily.  She notes she stopped her rosuvastatin last week.  She presents for diabetes evaluation, education, and management at the request of Dr. Andria Frames and in follow/up of previous pharmacy clinic visits. Last seen by PCP, Dr. Andria Frames, 01/17/2018.     Family history is significant for multiple younger sisters have been on dialysis and her deceased brother had also been on dialysis.   Insurance coverage/medication affordability: Is now through the "donut hole"  Patient reports adherence with medications.  With the exception of rosuvastatin which she stopped secondary to worsening muscle aches.  Current diabetes medications include: Tresiba 40 units dialy, Humalog 20 units prior to her two large meals, MetforminXR  750mg  BID, Jardiance (empagliflozin) 25 mg once daily and Victoza (liraglutide) 1.8mg  daily.   Current hypertension medications include: amlodipine 10mg , clonidine 0.1mg  BID, metoprololXL 50mg  daily, spironolactone 25mg  daily and telmisartan 80/12.5mg  daily.   Patient reports two hypoglycemic events - both awakening her from sleep.  Blood sugars when checked: 62 and the other was 87.   Symptoms resolved with appropriate management.   Patient reported dietary habits: Eats 2 meals/day  Patient-reported exercise habits: limited.  Notes she "went backward" at rehab which she attributes to increasing the dose of rosuvastatin.    Patient denies nocturia.  Patient reports neuropathy.  Continues to report significant pain - using 400mg  of ibuprofen multiple times per day AND acetaminophen 650mg  (takes 2 TID) = reviewed this  was the max dose of acetaminophen.   Advised to cut down on ibuprofen.   O:  Physical Exam  Musculoskeletal: She exhibits edema.  1+ edema in bilateral lower extremities.      Review of Systems  Respiratory: Negative.   Gastrointestinal: Positive for diarrhea.       Attributes diarrhea to recent intake of "bad food" - multiple family members affected.   Musculoskeletal: Positive for myalgias.       Increased aches following dose increase of rosuvastatin from 10mg  to 20mg  (never picked up higher 40mg  dose recently prescribed)  Neurological: Positive for tremors.       Attributes tremor to TID dosing of Lyrica (pregabalin)     Lab Results  Component Value Date   HGBA1C 10.1 (A) 02/27/2018   Vitals:   03/27/18 0913  BP: 128/76  Pulse: 78  SpO2: 96%    Lipid Panel     Component Value Date/Time   CHOL 288 (H) 05/09/2017 1025   TRIG 103 05/09/2017 1025   HDL 57 05/09/2017 1025   CHOLHDL 5.1 (H) 05/09/2017 1025   CHOLHDL 4.1 01/20/2015 1003   VLDL 10 01/20/2015 1003   LDLCALC 210 (H) 05/09/2017 1025   LDLDIRECT 223 (H) 10/13/2015 0932    Home fasting CBG: 150-250  2 hour post-prandial/random CBG: 180-250.   Clinical ASCVD: No  The 10-year ASCVD risk score Mikey Bussing DC Jr., et al., 2013) is: 36.7%   Values used to calculate the score:     Age: 72 years     Sex: Female     Is Non-Hispanic African American: Yes     Diabetic: Yes  Tobacco smoker: No     Systolic Blood Pressure: 381 mmHg     Is BP treated: Yes     HDL Cholesterol: 57 mg/dL     Total Cholesterol: 288 mg/dL    A/P: Diabetes longstanding, with recent improved control and two episodes of nocturnal hypoglycemia.  Recently had worsening pain likely due to increase statin dosing - resolving now. Patient was able to verbalize appropriate hypoglycemia management plan. Patient is adherent with medication. Control is suboptimal due to pain, and sedentary lifestyle. -Continued basal insulin Tresiba (insulin  degludec) at 40units daily  -Adjusted dose of  rapid insulin Humalog (insulin lispro) to allow for sliding scale reduction when she perceives potential for low carbohydrate intake (with a salad).  She has done this in the past with her "grandson's insulin".   -Continued GLP-1 Victoza (generic name liraglutide) at 1.8mg  daily.  -Continued SGLT2-I Jardiance (generic name empagliflozin) at 25mg  once daily. Recheck BMET today to evaluate renal function with use of Jardiance.  We discussed how stopping her liberal use of ibuprofen would be the first step if her creatinine level has remained elevated.  Then we could consider dose reduction of Jardiance to 10mg  once daily.  -Extensively discussed pathophysiology of DM, recommended lifestyle interventions, dietary effects on glycemic control -Counseled on s/sx of and management of hypoglycemia Discussed the potential to perform follow up urinalysis at next visit to assess protein in the urine. We discussed how we are providing her with maximal kidney preserving therapy at this time.  -Next A1C anticipated in early 2020.    ASCVD risk - secondary prevention in patient with DM. Last LDL is not controlled. ASCVD risk score is >30%  - high intensity statin indicated however she has a history of statin intolerance with atorvastatin and has had worsened pain control since dose escalating from rosuvastatin 10 to 20mg .  Aspirin is indicated.  -Continued aspirin 81 mg daily.  -Asked her to continue holding rosuvastatin for 1 week while she is traveling AND then restart rosuvastatin at 10mg  daily. She was asked to continue on this dose (if tolerated) until her lipid clinic follow visit (not yet scheduled).    Hypertension longstanding and currently under good control with in office and home readings. Patient is adherent with medication. Minimal use of furosemide at this time.  -continue to follow - BMET obtained today.   Pain - Chronic - continues at this visit.   Occasional increase in hand tremor, which she attributes to Lyrica (pregabalin) make increasing the dose of pregabalin challenging.   Discussed increasing duloxetine from 30 to 60mg  daily with PCP, Dr. Andria Frames. New Rx duloxetine 60mg  daily sent to pharmacy.   Written patient instructions provided.  Total time in face to face counseling 45 minutes.   Follow up PCP Clinic Visit  January 6th.  Then pharmacy clinic in the near future as requested by PCP.

## 2018-03-27 NOTE — Patient Instructions (Signed)
Great to see you today.   Enjoy your trip.   Continue Rebecca Hicks - same dose - 40 units once daily.   Adjust your Humalog as discussed by 5 units depending on both Carbohydrate "counting" and adjusting for your blood sugar measurement.   Usual dose should remain 20 units prior to meals at this time.   Incresae your Cymbalta (duloxetine) from 30mg  to 60 mg once daily.   Please try to decrease your use of both ibuprofen (motrin) AND acetaminophen (tylenol).  I hope your leg pain improves.   Rosuvastatin - continue to hold for 1 week.  Then restart at 10mg  once daily and continue on this dose until next follow-up with lipid clinic.   F/U with Dr. [name] in early January  Please reschedule your "Annual Wellness Visit" on your way out.

## 2018-03-27 NOTE — Assessment & Plan Note (Signed)
ASCVD risk - secondary prevention in patient with DM. Last LDL is not controlled. ASCVD risk score is >30%  - high intensity statin indicated however she has a history of statin intolerance with atorvastatin and has had worsened pain control since dose escalating from rosuvastatin 10 to 20mg .  Aspirin is indicated.  -Continued aspirin 81 mg daily.  -Asked her to continue holding rosuvastatin for 1 week while she is traveling AND then restart rosuvastatin at 10mg  daily. She was asked to continue on this dose (if tolerated) until her lipid clinic follow visit (not yet scheduled).

## 2018-03-27 NOTE — Progress Notes (Signed)
Patient ID: Rebecca Hicks, female   DOB: 1946-01-26, 72 y.o.   MRN: [id number] Reviewed: Agree with Dr. Graylin Shiver documentation and management.

## 2018-03-27 NOTE — Assessment & Plan Note (Signed)
Hypertension longstanding and currently under good control with in office and home readings. Patient is adherent with medication. Minimal use of furosemide at this time.  -continue to follow - BMET obtained today.

## 2018-03-27 NOTE — Assessment & Plan Note (Signed)
Pain - Chronic - continues at this visit.  Occasional increase in hand tremor, which she attributes to Lyrica (pregabalin) make increasing the dose of pregabalin challenging.   Discussed increasing duloxetine from 30 to 60mg  daily with PCP, Dr. Andria Frames. New Rx duloxetine 60mg  daily sent to pharmacy.

## 2018-03-28 ENCOUNTER — Ambulatory Visit: Payer: Medicare Other | Admitting: Physical Therapy

## 2018-03-28 LAB — BASIC METABOLIC PANEL
BUN / CREAT RATIO: 23 (ref 12–28)
BUN: 33 mg/dL — AB (ref 8–27)
CO2: 21 mmol/L (ref 20–29)
CREATININE: 1.42 mg/dL — AB (ref 0.57–1.00)
Calcium: 9.3 mg/dL (ref 8.7–10.3)
Chloride: 98 mmol/L (ref 96–106)
GFR calc Af Amer: 43 mL/min/{1.73_m2} — ABNORMAL LOW (ref >59)
GFR, EST NON AFRICAN AMERICAN: 37 mL/min/{1.73_m2} — AB (ref >59)
Glucose: 212 mg/dL — ABNORMAL HIGH (ref 65–99)
Potassium: 4.8 mmol/L (ref 3.5–5.2)
Sodium: 136 mmol/L (ref 134–144)

## 2018-04-01 ENCOUNTER — Ambulatory Visit: Payer: Medicare Other | Admitting: Rehabilitation

## 2018-04-01 ENCOUNTER — Telehealth: Payer: Self-pay | Admitting: Pharmacist

## 2018-04-01 ENCOUNTER — Encounter: Payer: Self-pay | Admitting: Rehabilitation

## 2018-04-01 DIAGNOSIS — R2681 Unsteadiness on feet: Secondary | ICD-10-CM

## 2018-04-01 DIAGNOSIS — R2689 Other abnormalities of gait and mobility: Secondary | ICD-10-CM

## 2018-04-01 DIAGNOSIS — R208 Other disturbances of skin sensation: Secondary | ICD-10-CM

## 2018-04-01 DIAGNOSIS — M6281 Muscle weakness (generalized): Secondary | ICD-10-CM | POA: Diagnosis not present

## 2018-04-01 NOTE — Telephone Encounter (Signed)
-----   Message from Zenia Resides, MD sent at 03/28/2018 10:47 AM EST ----- I called Ms Spadaccini and let her know that her creat had returned to 1.4, which is likely her new baseline.

## 2018-04-01 NOTE — Therapy (Signed)
[facility] [address] [place], [place], [address] Phone: [phone]   Fax:  [phone]  Physical Therapy Treatment and D/C Summary   Patient Details  Name: Rebecca Hicks MRN: [id number] Date of Birth: 1945-07-29 Referring Provider (PT): Almedia Balls, MD   Encounter Date: 04/01/2018  PT End of Session - 04/01/18 1040    Visit Number  17    Number of Visits  22    Date for PT Re-Evaluation  04/05/18    Authorization Type  [employer] Medicare    PT Start Time  0935    PT Stop Time  1016    PT Time Calculation (min)  41 min    Equipment Utilized During Treatment  Gait belt    Activity Tolerance  Patient tolerated treatment well    Behavior During Therapy  [facility] for tasks assessed/performed       Past Medical History:  Diagnosis Date  . Abdominal pain 12/05/2011  . Abdominal pain, left upper quadrant 12/03/2012  . Acute on chronic renal failure ([place]) 12/25/2012  . Arthritis   . 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 Ataxia 12/15/2015  . Atypical chest pain 03/28/2016  . Cataract   . CERUMEN IMPACTION, RIGHT 01/31/2010   Qualifier: Diagnosis of  By: Dianah Field MD, Marcello Moores    . Chest tightness 02/23/2014  . Chronic kidney disease (CKD), stage II (mild) 03/05/2013   Looks like baseline creat = 1.2 -1.3.  Watch for overdiuresis.   . COLONIC POLYPS, ADENOMATOUS 05/03/2009   Every five year colonoscopy due to adenomatous polyp found 04/2009    . DDD (degenerative disc disease), cervical 11/24/2016  . Diabetes mellitus   . Diabetic neuropathy, type II diabetes mellitus ([place]) 05/28/2008   Qualifier: Diagnosis of  By: Andria Frames MD, Gwyndolyn Saxon    . Diarrhea 01/21/2015  . DM (diabetes mellitus), type 2, uncontrolled ([place]) 02/07/2007   Qualifier: Diagnosis of  By: Wynetta Emery RN, Doroteo Bradford    . 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 Essential hypertension 02/07/2007   Qualifier: Diagnosis of  By: Wynetta Emery RN, Doroteo Bradford    . Excessive sleepiness 04/07/2014   . Fatigue 05/04/2011  . Glaucoma   . GLAUCOMA NOS 02/25/2009   Qualifier: Diagnosis of  By: Andria Frames MD, Gwyndolyn Saxon    . Great toe pain 09/18/2012  . History of nuclear stress test    Myoview 11/17: EF 64, Normal pharmacologic nuclear stress test with no evidence of prior infarct or ischemia.  Marland Kitchen Hoarseness 10/31/2016  . Hyperlipidemia   . Hypertension   . LOW BACK PAIN SYNDROME 05/28/2008   Qualifier: Diagnosis of  By: Andria Frames MD, Gwyndolyn Saxon    . Morbid obesity ([place])   . Neck pain 11/24/2016  . Neuromuscular disorder ([place])   . Obesity hypoventilation syndrome ([place]) 11/10/2015  . Obstructive sleep apnea 11/17/2015  . Otalgia of right ear 11/24/2016  . Polyneuropathy in diabetes(357.2)   . Sarcoidosis   . Sinusitis acute 10/03/2010  . 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 - She was fitted with a Small size [employer] Full Face Mask Simplus mask and heated humidification.   . Snoring 09/05/2015  . TB SKIN TEST, POSITIVE 02/07/2007   Annotation: with clear CXR mid 1990's Qualifier: History of  By: Milana Obey, Doroteo Bradford    . TOBACCO USE, QUIT 02/07/2007   Qualifier: Diagnosis of  By: Andria Frames MD, Gwyndolyn Saxon    .  Tremor of both hands 12/15/2015  . U R I 03/15/2010   Qualifier: Diagnosis of  By: Andria Frames MD, Gwyndolyn Saxon      Past Surgical History:  Procedure Laterality Date  . ABDOMINAL HYSTERECTOMY    . APPENDECTOMY    . CATARACT EXTRACTION, BILATERAL  03/2010, 04/2010  . COLONOSCOPY  2011  . EYE SURGERY    . POLYPECTOMY  2011   polyps, hems   . SPINE SURGERY     lumbar laminectomy X 2  . TONSILLECTOMY      There were no vitals filed for this visit.  Subjective Assessment - 04/01/18 0941    Subjective  Pt reports that she got epidural injection, pain is much better.  Also reports that she is going to [place] this week to see family.      Pertinent History  DM, HTN, falls,  spinal stenosis of lumbar region, fibular fracture 6/3-mostly healed, has been getting injections in back    Limitations  Walking;Standing    Patient Stated Goals  "I want to be able to get some exercise to get this weight off so my back feels better."     Currently in Pain?  Yes    Pain Score  4     Pain Location  Leg    Pain Orientation  Right;Left   L>R   Pain Descriptors / Indicators  Aching    Pain Type  Chronic pain    Pain Onset  More than a month ago    Pain Frequency  Constant    Aggravating Factors   increasing activity    Pain Relieving Factors  Tylenol         [facility] PT Assessment - 04/01/18 0947      6 Minute Walk- Baseline   6 Minute Walk- Baseline  yes    BP (mmHg)  126/84    HR (bpm)  78    02 Sat (%RA)  94 %    Modified Borg Scale for Dyspnea  0- Nothing at all    Perceived Rate of Exertion (Borg)  6-      6 Minute walk- Post Test   6 Minute Walk Post Test  yes    BP (mmHg)  160/90    HR (bpm)  100    02 Sat (%RA)  93 %    Modified Borg Scale for Dyspnea  8-    Perceived Rate of Exertion (Borg)  15- Hard      6 minute walk test results    Aerobic Endurance Distance Walked  848      Standardized Balance Assessment   Standardized Balance Assessment  Dynamic Gait Index      Dynamic Gait Index   Level Surface  Mild Impairment    Change in Gait Speed  Mild Impairment    Gait with Horizontal Head Turns  Normal    Gait with Vertical Head Turns  Normal    Gait and Pivot Turn  Normal    Step Over Obstacle  Severe Impairment    Step Around Obstacles  Normal    Steps  Moderate Impairment    Total Score  17    DGI comment:  Indicative of high fall risk                   [facility] Adult PT Treatment/Exercise - 04/01/18 0947      Self-Care   Self-Care  Other Self-Care Comments    Other Self-Care Comments   Discussed  checking LTGs and D/C pt today as she is at end of POC and is going out of town for approx 1 week.  Discussed that PT feels she is at  a point to increase her responsibility with community fitness and continuing to work on strength and endurance as well as balance from HEP that was given.  Encouraged her to return in a 3-4 months if needed but would require her to begin community fitness prior to that time.  Pt verbalized understanding.  Also continue to encourage walking program at 5-6 consecutive minutes at least 3 times per day.  Pt verbalized understanding.              PT Education - 04/01/18 1040    Education Details  see self care    Person(s) Educated  Patient    Methods  Explanation    Comprehension  Verbalized understanding       PT Short Term Goals - 02/21/18 1042      PT SHORT TERM GOAL #1   Title  Pt will be independent with initial HEP in order to indicate improved functional mobility.  (Target Date: 02/14/18-goal date reflects 1 week delay in start)    Baseline  February 21, 2018- organized patients current HEP to be easier with comply with by stretching everday and strength exercises every other day.    Time  --    Period  --    Status  Achieved      PT SHORT TERM GOAL #2   Title  Will re-assess DGI and set appropriate LTG.     Baseline  02/21/18 15/24 indicating high fall risk     Time  --    Period  --    Status  Achieved      PT SHORT TERM GOAL #3   Title  Pt will be able to tolerate up to 6 minutes of activity without unsafe change in BP.     Baseline  02/21/18: pt's BP elevated with this activity    Time  --    Period  --    Status  Not Met      PT SHORT TERM GOAL #4   Title  Will assess 5TSS and set appropriate LTG to reflect improved LE strength and decreased fall risk.     Baseline  02/21/18 improved from baseline-today 16.75, baseline 2.4 sec    Time  --    Period  --    Status  Achieved      PT SHORT TERM GOAL #5   Title  Pt will ambulate x 300' w/ rollator at mod I level over indoor sufaces in order to indicate safe indoor community mobility.      Baseline  02/21/18 met today     Status  Achieved      PT SHORT TERM GOAL #6   Status  New        PT Long Term Goals - 04/01/18 0943      PT LONG TERM GOAL #1   Title  Pt will be independent with final HEP and report transition to [employer] program in order to maintain mobility gains made in therapy.  (Target Date: 04/05/18)    Baseline  met with HEP, planning to get back to [facility] following trip to [facility]    Time  4    Period  Weeks    Status  Partially [place] #2  Title  Pt will improve DGI >19/24 with LRAD in order to indicate decreased fall risk.      Baseline  17/24 on 04/01/18 (improved from 14, but not to goal)    Time  4    Period  Weeks    Status  Partially Met      PT LONG TERM GOAL #3   Title  Pt will improve 6MWT w/ LRAD by 50' in order to indicate improved functional endurance using rollator.     Baseline  11/14: Pt performs 884 ft in 6 min duration and may have been limited 2/2 to busy environment and muscle cramping requiring x2 standing rest breaks.  51' on 12/10, two very small standing rest breaks    Time  4    Period  Weeks    Status  Not Met      PT LONG TERM GOAL #4   Title  Pt will ambulate up to 500' w/ LRAD over indoor/level and unlevel outdoor paved surfaces at mod I level in order to indicate safe community mobility.      Baseline  Reports that when she does longer distances she uses rollator and has ambulated around neighborhood by herself.     Time  4    Period  Weeks    Status  Achieved      PT LONG TERM GOAL #5   Title  Pt will report return to shopping at farmer[employer] market and cooking at home in order to indicate functional improvement in endurance and standing tolerance.     Baseline  pt has partially met as she has returned to shopping, still needs breaks and doesn't do very long trips    Time  4    Period  Weeks    Status  Partially Met      PT LONG TERM GOAL #6   Title  Pt will perform 5xSTS from standard height chair using B UE's for  assistance in </= 15 seconds indicating reduction in fall risk potential and improvement in LE power     Baseline  10/11: 22.4 seconds from standard height chair using B UE's for assist     Status  Unable to assess            Plan - 04/01/18 1040    Clinical Impression Statement  Skilled session focused on assessing LTGs and D/C from PT at this time.  Note that pt is going on a trip to see family on Thursday and feel that it is good time to take a break from PT and allow her to take increased responsibility for initiating community fitness and walking program.  Pt has met 1/6 LTGs, partially meeting 3/6 goals, not meeting 6MWT goal and also not having time to assess 5TSS goal.  Pt agreeable to D/C at this time.     Rehab Potential  Good    Clinical Impairments Affecting Rehab Potential  co-morbidities including spinal stenosis and B knee pain    PT Frequency  2x / week    PT Duration  4 weeks    PT Treatment/Interventions  ADLs/Self Care Home Management;DME Instruction;Gait training;Stair training;Functional mobility training;Therapeutic activities;Therapeutic exercise;Balance training;Neuromuscular re-education;Patient/family education;Orthotic Fit/Training;Passive range of motion;Energy conservation;Vestibular    PT Next Visit Plan  --    PT Home Exercise Plan  [id number]     Consulted and Agree with Plan of Care  Patient       Patient will benefit from skilled therapeutic intervention in order to  improve the following deficits and impairments:  Abnormal gait, Decreased activity tolerance, Decreased balance, Decreased endurance, Decreased knowledge of use of DME, Decreased mobility, Decreased range of motion, Decreased strength, Difficulty walking, Impaired perceived functional ability, Impaired flexibility, Impaired sensation, Postural dysfunction, Obesity  Visit Diagnosis: Muscle weakness (generalized)  Unsteadiness on feet  Other abnormalities of gait and mobility  Other  disturbances of skin sensation    PHYSICAL THERAPY DISCHARGE SUMMARY  Visits from Start of Care: 17  Current functional level related to goals / functional outcomes: See LTGs above   Remaining deficits: Pt continues to have balance deficits, BLE weakness, and endurance deficits.  Has HEP and walking program to address, however feel that she has not been consistent in performing these and has not initiated return to community fitness at this time.    Education / Equipment: HEP, walking program   Plan: Patient agrees to discharge.  Patient goals were partially met. Patient is being discharged due to meeting the stated rehab goals.  ?????       Problem List Patient Active Problem List   Diagnosis Date Noted  . Shingles 10/31/2017  . Estrogen deficiency 10/31/2017  . Diabetic retinopathy ([place]) 10/28/2017  . Mixed stress and urge urinary incontinence 10/03/2017  . Closed right fibular fracture 10/03/2017  . Dependent edema 10/03/2017  . Lumbosacral radiculopathy 09/20/2017  . Diabetic polyneuropathy associated with type 2 diabetes mellitus ([place]) 09/20/2017  . Ulnar neuropathy at elbow of left upper extremity 09/20/2017  . Coronary artery calcification 02/28/2017  . Neck pain 11/24/2016  . Otalgia of right ear 11/24/2016  . DDD (degenerative disc disease), cervical 11/24/2016  . Arthropathy 11/24/2016  . Left arm numbness 10/31/2016  . Hoarseness 10/31/2016  . Atypical chest pain 03/28/2016  . Tremor of both hands 12/15/2015  . Ataxia 12/15/2015  . Obstructive sleep apnea 11/17/2015  . 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 Periodic limb movements of sleep 11/10/2015  . Obesity hypoventilation syndrome ([place]) 11/10/2015  . Chronic kidney disease (CKD), stage II (mild) 03/05/2013  . COLONIC POLYPS, ADENOMATOUS 05/03/2009  . GLAUCOMA NOS 02/25/2009  . Diabetic neuropathy, type II diabetes mellitus ([place]) 05/28/2008  . LOW BACK PAIN  SYNDROME 05/28/2008  . 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 DM (diabetes mellitus), type 2, uncontrolled ([place]) 02/07/2007  . Hyperlipidemia 02/07/2007  . Morbid obesity ([place]) 02/07/2007  . Essential hypertension 02/07/2007  . TB SKIN TEST, POSITIVE 02/07/2007  . TOBACCO USE, QUIT 02/07/2007  . HYSTERECTOMY, HX OF 02/07/2007    Cameron Sprang, PT, MPT [facility] [address] [place], [place], [address] Phone: [phone]   Fax:  [phone] 04/01/18, 10:45 AM  Name: NYOMIE EHRLICH MRN: [id number] Date of Birth: 08/26/1945

## 2018-04-01 NOTE — Telephone Encounter (Signed)
Called to share results of BMET, specifically that Scr returned back down to similar to readings throughout the year.  Patient and I discussed the potential for this to be due to the [place].  She was relieved to hear that the value had decreased

## 2018-04-03 ENCOUNTER — Ambulatory Visit: Payer: Medicare Other | Admitting: Physical Therapy

## 2018-04-03 ENCOUNTER — Ambulatory Visit: Payer: Medicare Other

## 2018-04-07 ENCOUNTER — Telehealth: Payer: Self-pay | Admitting: *Deleted

## 2018-04-07 MED ORDER — LOSARTAN POTASSIUM 100 MG PO TABS: 100.0000 mg | ORAL_TABLET | Freq: Every day | ORAL | 3 refills | Status: DC

## 2018-04-07 MED ORDER — HYDROCHLOROTHIAZIDE 12.5 MG PO TABS: 12.5000 mg | ORAL_TABLET | Freq: Every day | ORAL | 3 refills | Status: DC

## 2018-04-07 NOTE — Telephone Encounter (Signed)
Rx provided losartain 100 mg and HCTZ 12.5 mg

## 2018-04-07 NOTE — Telephone Encounter (Signed)
Received fax from [place].  Telmisartan-HCTZ 80-12.5 is on backorder for the next 6 - 12 weeks.  Message from [facility]:  Action requested: Please provide a new script, if possible, for the most therapeutic equivalent for your patient. Losartan 50 &100 mgs as well as HCTZ 12.5 & 25mg  are available.  Two separate scripts are required to make this change.  They also have all ACE1 and combos in stock.  Will forward to PCP. Audra Kagel, Salome Spotted, CMA

## 2018-04-09 ENCOUNTER — Ambulatory Visit: Payer: Medicare Other | Admitting: Physical Therapy

## 2018-04-15 IMAGING — MR MR LUMBAR SPINE W/O CM
4 of 5 series · 25 of 48 positions shown · non-contrast
Comparison: MRI lumbar spine January 31, 2015

CLINICAL DATA: Low back pain radiating to bilateral hips and legs,
feet numbness. History of spinal surgery 1559. Multiple spine
injections.

EXAM:
MRI LUMBAR SPINE WITHOUT CONTRAST
TECHNIQUE: Multiplanar, multisequence MR imaging of the lumbar spine was
performed. No intravenous contrast was administered.

[Series 3: T2 post-contrast · sagittal · 4.0mm · 0.55mm/px · 6 of 13 slices shown]
[im 1/13]
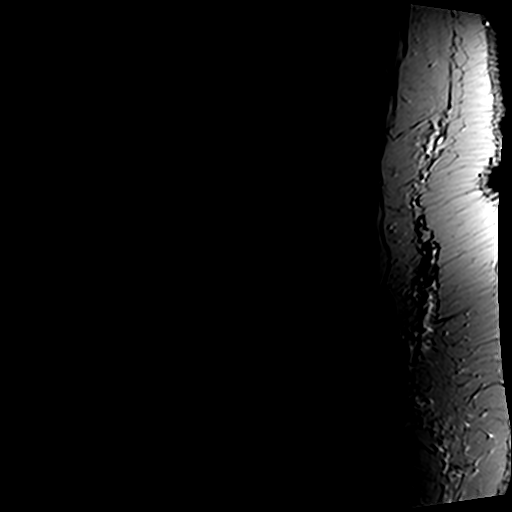
[im 3/13]
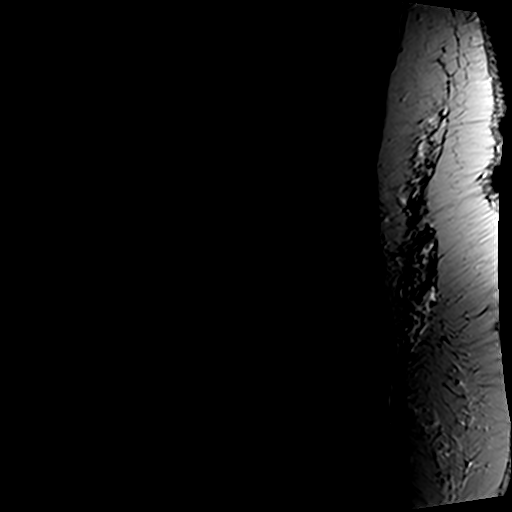
[im 5/13]
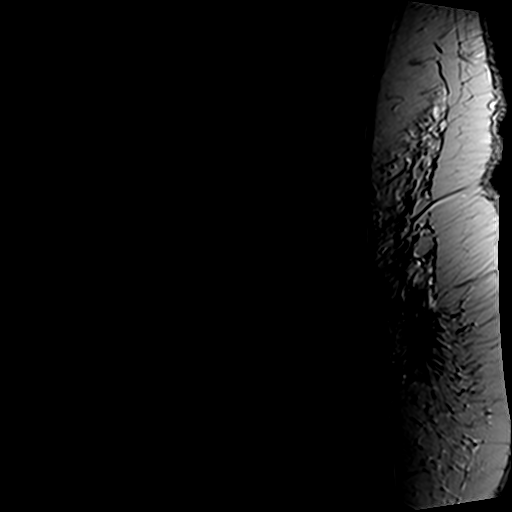
[im 8/13]
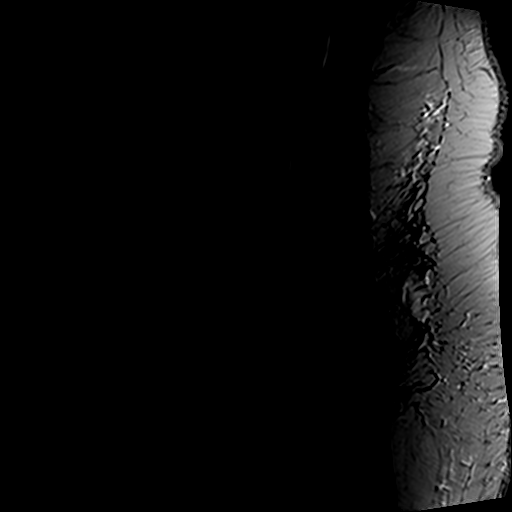
[im 10/13]
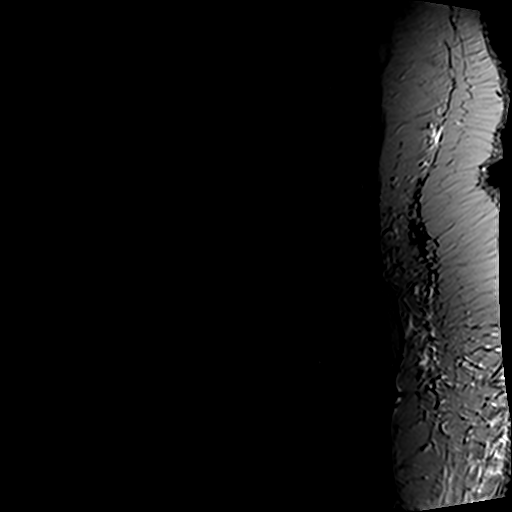
[im 13/13]
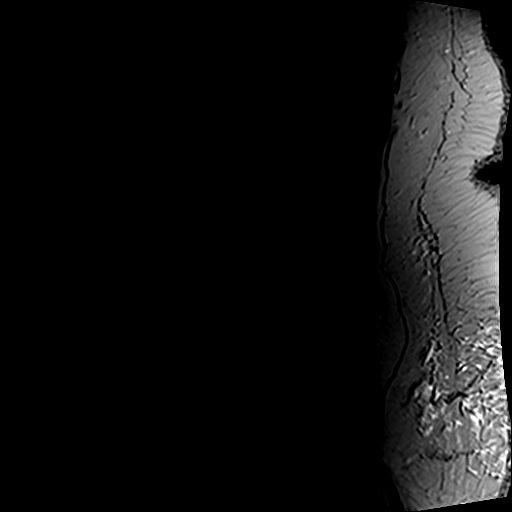

[Series 5: T1 · sagittal · 4.0mm · 0.55mm/px · 6 of 13 slices shown (1 of 2)]
[im 1/13]
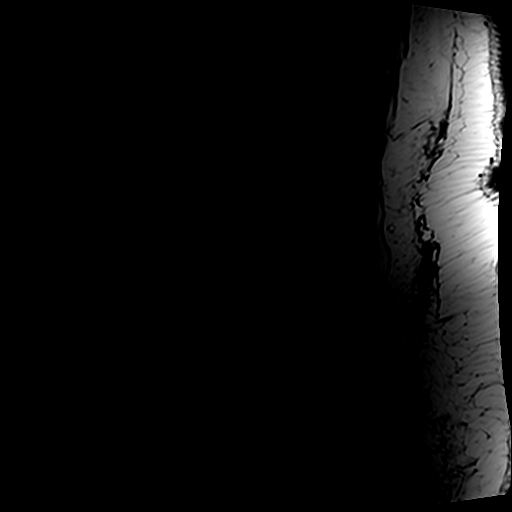
[im 3/13]
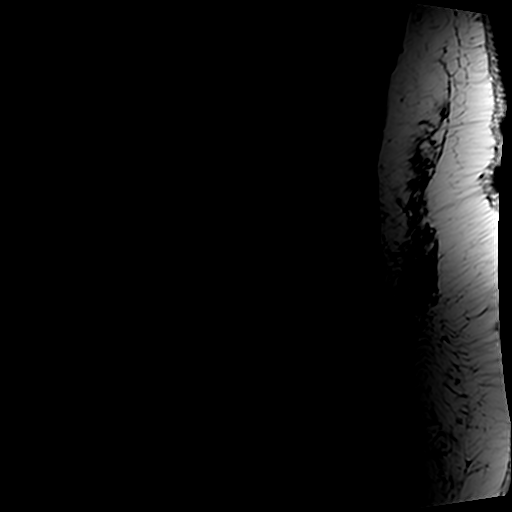
[im 5/13]
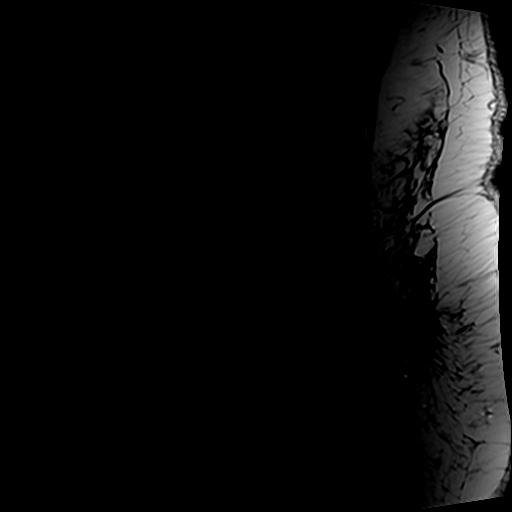
[im 8/13]
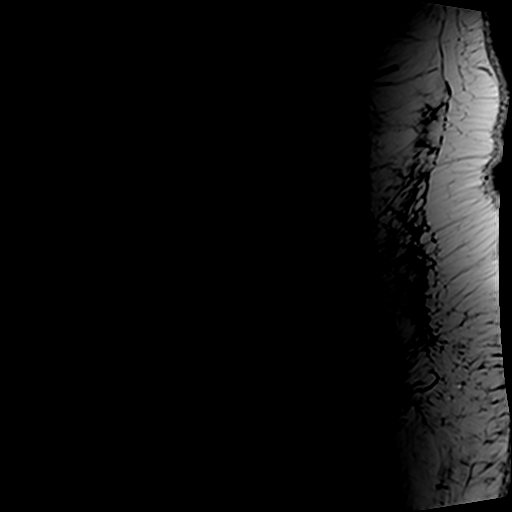
[im 10/13]
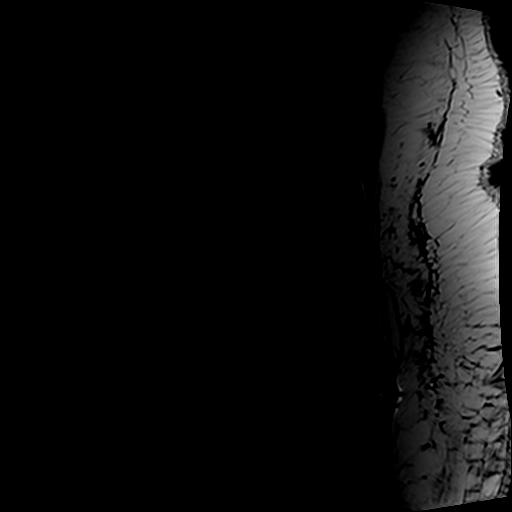
[im 13/13]
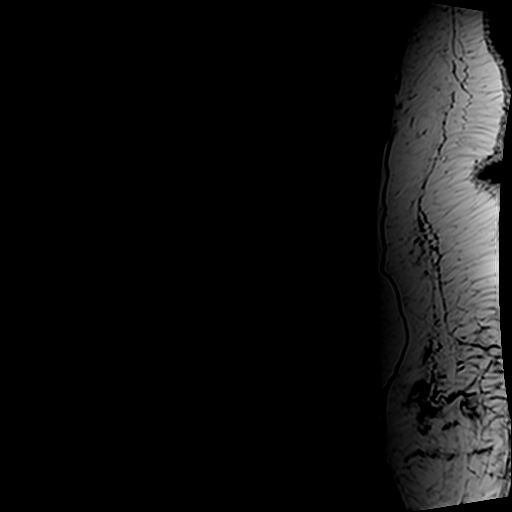

[Series 6: T1 · axial · 4.0mm · 0.35mm/px · z∈[-123,+43]mm · 4 of 35 slices shown (2 of 2)]
[im 1/35]
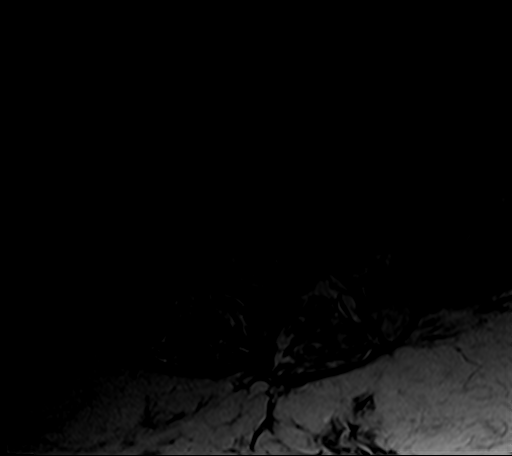
[im 5/35]
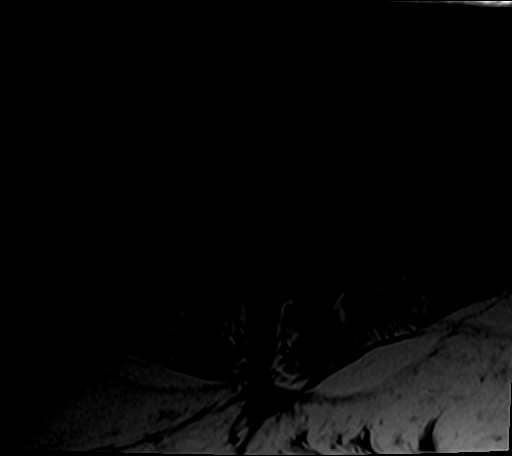
[im 18/35]
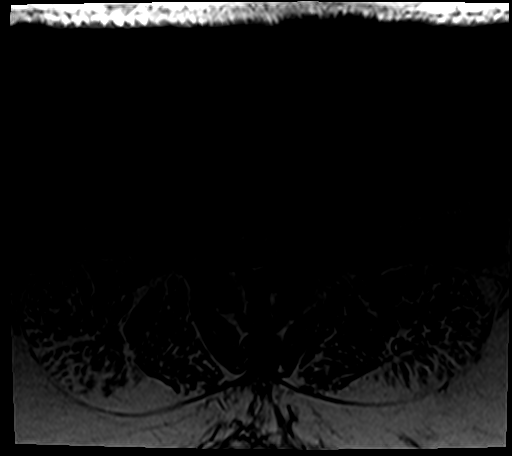
[im 30/35]
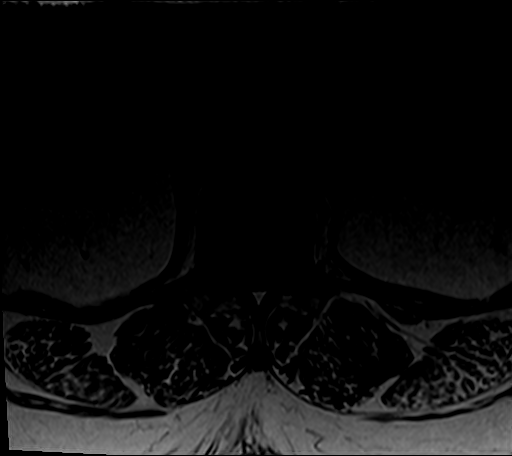

[Series 7: T2 · axial · 4.0mm · 0.70mm/px · z∈[-123,+69]mm · 9 of 35 slices shown]
[im 1/35]
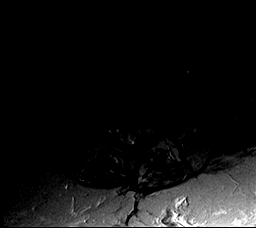
[im 5/35]
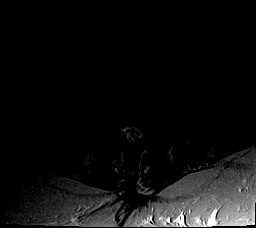
[im 10/35]
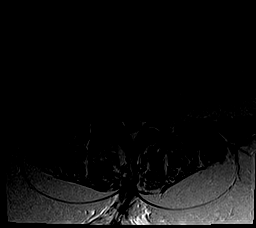
[im 15/35]
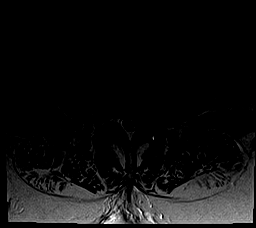
[im 18/35]
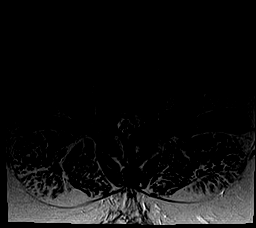
[im 20/35]
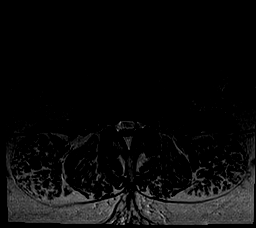
[im 25/35]
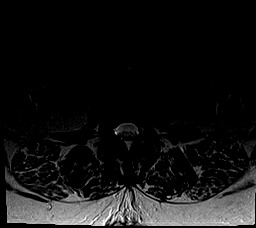
[im 30/35]
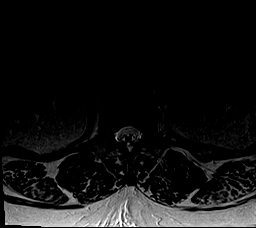
[im 35/35]
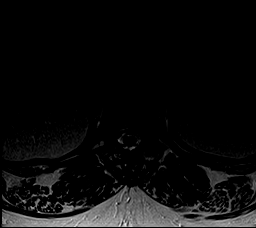

[25 of 48 positions shown; findings below may reference images not displayed]

FINDINGS: SEGMENTATION: For the purposes of this report, the last well-formed
intervertebral disc is reported as L5-S1.

ALIGNMENT: Maintained lumbar lordosis. Minimal grade 1 L3-4
anterolisthesis increased from prior examination.

VERTEBRAE:Vertebral bodies are intact. Severe L4-5 and L5-S1 disc
height loss, similar to progressed. Moderate L3-4 disc height loss.
Disc desiccation L3-4 through L5-S1 compatible with degenerative
discs. Mild chronic discogenic endplate changes L3-4, moderate
subacute to chronic discogenic endplate changes L4-5 and L5-S1. No
suspicious or acute bone marrow signal. The a congenital canal
narrowing on the basis of foreshortened pedicles. Mild epidural
lipomatosis.

CONUS MEDULLARIS AND CAUDA EQUINA: Conus medullaris terminates at L1
and demonstrates normal morphology and signal characteristics.
Central displacement of the cauda equina due to canal stenosis.

PARASPINAL AND OTHER SOFT TISSUES: Nonacute. Moderate symmetric
paraspinal muscle atrophy. Lower lumbar paraspinal soft tissue
scarring.

DISC LEVELS:

T12-L1, L1-2, L2-3: No disc bulge, canal stenosis nor neural
foraminal narrowing. Mild facet arthropathy.

L3-4: Anterolisthesis. 5 mm broad-based disc bulge, new large LEFT
subarticular to extraforaminal disc protrusion/extrusion without
migration. Moderate to severe facet arthropathy and ligamentum
flavum redundancy. Mild canal stenosis with narrowed LEFT lateral
recess likely affecting the traversing LEFT L4 nerve with potential
exited LEFT L3 nerve impingement. Stable moderate RIGHT, increasing,
now severe LEFT neural foraminal narrowing.

L4-5: Similar 4 mm broad-based disc bulge asymmetric to the RIGHT
possibly encroaching upon the exited RIGHT L4 nerve. Moderate to
severe RIGHT, mild LEFT facet arthropathy. Mild canal stenosis with
mild epidural lipomatosis narrowing the thecal sac. Stable severe
RIGHT, mild LEFT neural foraminal narrowing.

L5-S1: Similar 4 mm broad-based disc bulge. Status post LEFT
hemilaminectomy. Granulation tissue tenting the thecal sac toward
the LEFT. No canal stenosis. Moderate to severe RIGHT, severe LEFT
neural foraminal narrowing is similar.
IMPRESSION: 1. New large L3-4 subarticular to extraforaminal disc
protrusion/extrusion with possible exited LEFT L3 and traversing
LEFT L4 nerve impingement. New L3-4 grade 1 anterolisthesis without
spondylolysis.
2. Status post LEFT L5 hemilaminectomy. Progressed degenerative
change of the lumbar spine.
3. Mild canal stenosis L3-4 and L4-5. Severe L3-4 through L5-S1
neural foraminal narrowing.

## 2018-04-28 ENCOUNTER — Ambulatory Visit: Payer: Medicare Other | Admitting: Family Medicine

## 2018-05-01 ENCOUNTER — Encounter: Payer: Self-pay | Admitting: Family Medicine

## 2018-05-01 ENCOUNTER — Ambulatory Visit: Payer: Medicare Other

## 2018-05-05 ENCOUNTER — Other Ambulatory Visit: Payer: Self-pay

## 2018-05-05 ENCOUNTER — Ambulatory Visit: Payer: Medicare Other | Admitting: Family Medicine

## 2018-05-05 ENCOUNTER — Encounter: Payer: Self-pay | Admitting: Family Medicine

## 2018-05-05 DIAGNOSIS — E1140 Type 2 diabetes mellitus with diabetic neuropathy, unspecified: Secondary | ICD-10-CM

## 2018-05-05 DIAGNOSIS — I10 Essential (primary) hypertension: Secondary | ICD-10-CM | POA: Diagnosis not present

## 2018-05-05 DIAGNOSIS — D126 Benign neoplasm of colon, unspecified: Secondary | ICD-10-CM

## 2018-05-05 DIAGNOSIS — Z794 Long term (current) use of insulin: Secondary | ICD-10-CM

## 2018-05-05 NOTE — Assessment & Plan Note (Signed)
Neuropathy on foot exam.  Reminded of foot care.

## 2018-05-05 NOTE — Patient Instructions (Addendum)
I hope this is the year that you really start enjoying retirement.   No medicine changes.  I hope you make some changes with diet and exercise.   Keep working with Dr. Valentina Lucks See me in three months.  We will call that visit an annual physical.   Good luck with your colonoscopy.

## 2018-05-05 NOTE — Assessment & Plan Note (Signed)
Diet and exercise.   

## 2018-05-05 NOTE — Assessment & Plan Note (Signed)
No change for now.  Keep an eye on home BP.  Call to adjust meds if consistently high or low.

## 2018-05-05 NOTE — Progress Notes (Signed)
Established Patient Office Visit  Subjective:  Patient ID: Rebecca Hicks, female    DOB: 03/19/1946  Age: 73 y.o. MRN: [id number]  CC:  Chief Complaint  Patient presents with  . Follow-up    HPI Rebecca Hicks presents for multiple problems. 1. DM 2 with complications.  Lots of meds. Metformin, victoza, januvia plus short and long acting insulin.  A1C is still 10.  See number 2. 2. Tough year.  Recently retired.  Spent first year helping/enabling (her word) some family members.  Vows to take better care of self this year.  Had an eye opening visit with family in [place].  She comes back with a new resolve. 3. Cold x 10 days.  Improving.  Only dry cough now. 4. BP.  On meds.  Denies symptoms.  Noting low BP today, specifically denies lightheadedness.  She does monitor home BPs. 5. HPDP has colonoscopy already scheduled.  Due for diabetic foot exam. Past Medical History:  Diagnosis Date  . Abdominal pain 12/05/2011  . Abdominal pain, left upper quadrant 12/03/2012  . Acute on chronic renal failure ([place]) 12/25/2012  . Arthritis   . 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 Ataxia 12/15/2015  . Atypical chest pain 03/28/2016  . Cataract   . CERUMEN IMPACTION, RIGHT 01/31/2010   Qualifier: Diagnosis of  By: Dianah Field MD, Marcello Moores    . Chest tightness 02/23/2014  . Chronic kidney disease (CKD), stage II (mild) 03/05/2013   Looks like baseline creat = 1.2 -1.3.  Watch for overdiuresis.   . COLONIC POLYPS, ADENOMATOUS 05/03/2009   Every five year colonoscopy due to adenomatous polyp found 04/2009    . DDD (degenerative disc disease), cervical 11/24/2016  . Diabetes mellitus   . Diabetic neuropathy, type II diabetes mellitus ([facility]) 05/28/2008   Qualifier: Diagnosis of  By: Andria Frames MD, Gwyndolyn Saxon    . Diarrhea 01/21/2015  . DM (diabetes mellitus), type 2, uncontrolled ([place]) 02/07/2007   Qualifier: Diagnosis of  By: Wynetta Emery RN, Doroteo Bradford    . 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 Essential hypertension  02/07/2007   Qualifier: Diagnosis of  By: Wynetta Emery RN, Doroteo Bradford    . Excessive sleepiness 04/07/2014  . Fatigue 05/04/2011  . Glaucoma   . GLAUCOMA NOS 02/25/2009   Qualifier: Diagnosis of  By: Andria Frames MD, Gwyndolyn Saxon    . Great toe pain 09/18/2012  . History of nuclear stress test    Myoview 11/17: EF 64, Normal pharmacologic nuclear stress test with no evidence of prior infarct or ischemia.  Marland Kitchen Hoarseness 10/31/2016  . Hyperlipidemia   . Hypertension   . LOW BACK PAIN SYNDROME 05/28/2008   Qualifier: Diagnosis of  By: Andria Frames MD, Gwyndolyn Saxon    . Morbid obesity ([place])   . Neck pain 11/24/2016  . Neuromuscular disorder ([place])   . Obesity hypoventilation syndrome ([place]) 11/10/2015  . Obstructive sleep apnea 11/17/2015  . Otalgia of right ear 11/24/2016  . Polyneuropathy in diabetes(357.2)   . Sarcoidosis   . Sinusitis acute 10/03/2010  . 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 - She was fitted with a Small size [employer] Full Face Mask Simplus mask and heated humidification.   . Snoring 09/05/2015  . TB SKIN TEST, POSITIVE 02/07/2007   Annotation: with clear CXR mid 1990's Qualifier: History of  By: Milana Obey, Doroteo Bradford    . TOBACCO USE, QUIT 02/07/2007  Qualifier: Diagnosis of  By: Andria Frames MD, William    . Tremor of both hands 12/15/2015  . U R I 03/15/2010   Qualifier: Diagnosis of  By: Andria Frames MD, Gwyndolyn Saxon      Past Surgical History:  Procedure Laterality Date  . ABDOMINAL HYSTERECTOMY    . APPENDECTOMY    . CATARACT EXTRACTION, BILATERAL  03/2010, 04/2010  . COLONOSCOPY  2011  . EYE SURGERY    . POLYPECTOMY  2011   polyps, hems   . SPINE SURGERY     lumbar laminectomy X 2  . TONSILLECTOMY      Family History  Problem Relation Age of Onset  . Heart disease Brother   . Cervical cancer Mother   . Stroke Father   . Alcohol abuse Father   . Alcohol abuse Brother   .  Pancreatic cancer Maternal Uncle   . Hypertension Unknown        siblings  . Diabetes Unknown        siblings  . Breast cancer Maternal Aunt   . Stroke Paternal Grandmother   . Colon cancer Neg Hx   . Esophageal cancer Neg Hx   . Rectal cancer Neg Hx   . Stomach cancer Neg Hx     Social History   Socioeconomic History  . Marital status: Single    Spouse name: Not on file  . Number of children: 0  . Years of education: college  . Highest education level: Not on file  Occupational History  . Occupation: retired    Fish farm manager: [employer]  . Financial resource strain: Not on file  . Food insecurity:    Worry: Not on file    Inability: Not on file  . Transportation needs:    Medical: Not on file    Non-medical: Not on file  Tobacco Use  . Smoking status: Former Smoker    Packs/day: 1.00    Years: 20.00    Pack years: 20.00    Types: Cigarettes    Last attempt to quit: 04/24/1995    Years since quitting: 23.0  . Smokeless tobacco: Never Used  Substance and Sexual Activity  . Alcohol use: No    Alcohol/week: 0.0 standard drinks  . Drug use: No  . Sexual activity: Not on file  Lifestyle  . Physical activity:    Days per week: Not on file    Minutes per session: Not on file  . Stress: Not on file  Relationships  . Social connections:    Talks on phone: Not on file    Gets together: Not on file    Attends religious service: Not on file    Active member of club or organization: Not on file    Attends meetings of clubs or organizations: Not on file    Relationship status: Not on file  . Intimate partner violence:    Fear of current or ex partner: Not on file    Emotionally abused: Not on file    Physically abused: Not on file    Forced sexual activity: Not on file  Other Topics Concern  . Not on file  Social History Narrative   Lives with 2 sisters and a nephew in a 2 story home but stays on the first floor.  Retired from the [employer].  Education: college.     Outpatient Medications Prior to Visit  Medication Sig Dispense Refill  . rosuvastatin (CRESTOR) 10 MG tablet Take  10 mg by mouth daily.    Marland Kitchen acetaminophen (TYLENOL) 650 MG CR tablet Take 650 mg by mouth.    Marland Kitchen albuterol (PROAIR HFA) 108 (90 Base) MCG/ACT inhaler Inhale 2 puffs into the lungs every 6 (six) hours as needed for wheezing or shortness of breath. (Patient not taking: Reported on 03/27/2018) 1 Inhaler 2  . amLODipine (NORVASC) 10 MG tablet TAKE 1 TABLET BY MOUTH  DAILY 90 tablet 3  . aspirin 81 MG tablet Take 81 mg by mouth daily.      . Blood Glucose Monitoring Suppl (ONE TOUCH ULTRA 2) w/Device KIT Test blood sugar three times per day. 1 each 0  . Blood Pressure Monitoring (BLOOD PRESSURE KIT) DEVI 1 Act by Does not apply route daily. 1 Device 0  . budesonide-formoterol (SYMBICORT) 160-4.5 MCG/ACT inhaler INHALE 2 PUFFS INTO THE&nbsp;&nbsp;LUNGS TWO TIMES DAILY 30.6 g 2  . cloNIDine (CATAPRES) 0.1 MG tablet Take 0.1 mg by mouth 2 (two) times daily.    . diphenoxylate-atropine (LOMOTIL) 2.5-0.025 MG tablet Take 1 tablet by mouth 4 (four) times daily as needed for diarrhea or loose stools. 120 tablet 6  . DULoxetine (CYMBALTA) 60 MG capsule Take 1 capsule (60 mg total) by mouth daily. 90 capsule 3  . empagliflozin (JARDIANCE) 25 MG TABS tablet Take 25 mg by mouth daily. 90 tablet 3  . furosemide (LASIX) 40 MG tablet Take 40 mg by mouth as needed.    . hydrochlorothiazide (HYDRODIURIL) 12.5 MG tablet Take 1 tablet (12.5 mg total) by mouth daily. 90 tablet 3  . ibuprofen (ADVIL,MOTRIN) 200 MG tablet Take 200 mg by mouth every 8 (eight) hours as needed.    . insulin degludec (TRESIBA FLEXTOUCH) 100 UNIT/ML SOPN FlexTouch Pen Inject 0.4 mLs (40 Units total) into the skin daily. 36 ml should be 90 day supply.    . insulin lispro (HUMALOG) 100 UNIT/ML injection Inject 20-25 units twice daily with a meal 10 mL 3  . Insulin Pen Needle 31G X 8 MM MISC Use to  inject insulin and Victoza 4 times daily 300 each 2  . liraglutide (VICTOZA) 18 MG/3ML SOPN Inject 1.8 mg into the skin daily.    Marland Kitchen losartan (COZAAR) 100 MG tablet Take 1 tablet (100 mg total) by mouth daily. 90 tablet 3  . metFORMIN (GLUCOPHAGE XR) 750 MG 24 hr tablet Take 1 tablet (750 mg total) by mouth 2 (two) times daily. 90 tablet 3  . metoprolol succinate (TOPROL-XL) 50 MG 24 hr tablet Take 1 tablet (50 mg total) by mouth every evening. Take with or immediately following a meal. 90 tablet 3  . nitroGLYCERIN (NITROSTAT) 0.4 MG SL tablet Place 1 tablet (0.4 mg total) under the tongue every 5 (five) minutes as needed for chest pain. (Patient not taking: Reported on 03/27/2018) 25 tablet 3  . ONE TOUCH ULTRA TEST test strip USE TO TEST BLOOD SUGAR 3  TIMES DAILY 300 each 3  . pregabalin (LYRICA) 50 MG capsule Take 1 capsule (50 mg total) by mouth 3 (three) times daily. 90 capsule 2  . spironolactone (ALDACTONE) 25 MG tablet TAKE 1 TABLET BY MOUTH  DAILY 90 tablet 3  . telmisartan-hydrochlorothiazide (MICARDIS HCT) 80-12.5 MG tablet TAKE 1 TABLET BY MOUTH  DAILY 90 tablet 3  . timolol (TIMOPTIC) 0.25 % ophthalmic solution Place 1 drop into both eyes 2 (two) times daily. Per optho     . tolterodine (DETROL) 1 MG tablet Take 1 tablet (1 mg total) by mouth 2 (  two) times daily. 60 tablet 2  . rosuvastatin (CRESTOR) 40 MG tablet Take 1 tablet (40 mg total) by mouth daily. (Patient not taking: Reported on 03/27/2018) 30 tablet 11   No facility-administered medications prior to visit.     Allergies  Allergen Reactions  . Penicillins Rash and Other (See Comments)    Has patient had a PCN reaction causing immediate rash, facial/tongue/throat swelling, SOB or lightheadedness with hypotension: Yes Has patient had a PCN reaction causing severe rash involving mucus membranes or skin necrosis: No Has patient had a PCN reaction that required hospitalization: No Has patient had a PCN reaction occurring within  the last 10 years: Yes If all of the above answers are "NO", then may proceed with Cephalosporin use.     ROS Review of Systems    Objective:    Physical Exam  BP (!) 90/58   Pulse 82   Temp 98.2 F (36.8 C) (Oral)   Ht 5' 4" (1.626 m)   Wt 287 lb (130.2 kg)   SpO2 92%   BMI 49.26 kg/m  Wt Readings from Last 3 Encounters:  05/05/18 287 lb (130.2 kg)  03/27/18 289 lb 3.2 oz (131.2 kg)  03/03/18 286 lb 9.6 oz (130 kg)  Low BP noted Lungs clear Cardiac RRR without m or g Ext 2+ peripheral edema.  Diabetic foot exam done.   Health Maintenance Due  Topic Date Due  . FOOT EXAM  03/13/2018  . COLONOSCOPY  03/24/2018    There are no preventive care reminders to display for this patient.  Lab Results  Component Value Date   TSH 3.140 08/22/2016   Lab Results  Component Value Date   WBC 6.7 09/23/2017   HGB 13.3 09/23/2017   HCT 43.4 09/23/2017   MCV 89.3 09/23/2017   PLT 190 09/23/2017   Lab Results  Component Value Date   NA 136 03/27/2018   K 4.8 03/27/2018   CO2 21 03/27/2018   GLUCOSE 212 (H) 03/27/2018   BUN 33 (H) 03/27/2018   CREATININE 1.42 (H) 03/27/2018   BILITOT 0.2 05/09/2017   ALKPHOS 86 05/09/2017   AST 17 05/09/2017   ALT 13 05/09/2017   PROT 6.9 05/09/2017   ALBUMIN 3.8 05/09/2017   CALCIUM 9.3 03/27/2018   ANIONGAP 10 09/23/2017   GFR 53.85 (L) 08/23/2014   Lab Results  Component Value Date   CHOL 288 (H) 05/09/2017   Lab Results  Component Value Date   HDL 57 05/09/2017   Lab Results  Component Value Date   LDLCALC 210 (H) 05/09/2017   Lab Results  Component Value Date   TRIG 103 05/09/2017   Lab Results  Component Value Date   CHOLHDL 5.1 (H) 05/09/2017   Lab Results  Component Value Date   HGBA1C 10.1 (A) 02/27/2018      Assessment & Plan:   Problem List Items Addressed This Visit    None      No orders of the defined types were placed in this encounter.   Follow-up: No follow-ups on file.     Zenia Resides, MD

## 2018-05-05 NOTE — Assessment & Plan Note (Signed)
Has appointment for FU colonoscopy in next few weeks.

## 2018-05-05 NOTE — Assessment & Plan Note (Signed)
No med changes.  FU Rebecca Hicks.  She seems poised to markedly improve diet and exercise.

## 2018-05-07 ENCOUNTER — Ambulatory Visit (AMBULATORY_SURGERY_CENTER): Payer: Medicare Other

## 2018-05-07 ENCOUNTER — Encounter: Payer: Self-pay | Admitting: Gastroenterology

## 2018-05-07 VITALS — Ht 65.0 in | Wt 285.0 lb

## 2018-05-07 DIAGNOSIS — Z860100 Personal history of colon polyps, unspecified: Secondary | ICD-10-CM

## 2018-05-07 MED ORDER — PEG 3350-KCL-NA BICARB-NACL 420 G PO SOLR: 4000.0000 mL | Freq: Once | ORAL | 0 refills | Status: AC

## 2018-05-07 NOTE — Progress Notes (Signed)
No egg or soy allergy known to patient  No issues with past sedation with any surgeries  or procedures, no intubation problems  No diet pills per patient No home 02 use per patient  No blood thinners per patient  Pt denies issues with constipation  No A fib or A flutter  EMMI video sent to pt's e mail  Pt. declined 

## 2018-05-14 ENCOUNTER — Encounter: Payer: Self-pay | Admitting: Gastroenterology

## 2018-05-14 ENCOUNTER — Ambulatory Visit (AMBULATORY_SURGERY_CENTER): Payer: Medicare Other | Admitting: Gastroenterology

## 2018-05-14 VITALS — BP 117/75 | HR 63 | Temp 95.7°F | Resp 15 | Ht 65.0 in | Wt 285.0 lb

## 2018-05-14 DIAGNOSIS — D122 Benign neoplasm of ascending colon: Secondary | ICD-10-CM

## 2018-05-14 DIAGNOSIS — D125 Benign neoplasm of sigmoid colon: Secondary | ICD-10-CM

## 2018-05-14 DIAGNOSIS — D120 Benign neoplasm of cecum: Secondary | ICD-10-CM | POA: Diagnosis not present

## 2018-05-14 DIAGNOSIS — Z86010 Personal history of colonic polyps: Secondary | ICD-10-CM | POA: Diagnosis present

## 2018-05-14 MED ORDER — SODIUM CHLORIDE 0.9 % IV SOLN: 500.0000 mL | Freq: Once | INTRAVENOUS | Status: DC

## 2018-05-14 NOTE — Patient Instructions (Signed)
Discharge instructions given. Handout on polyps. Resume previous medications. YOU HAD AN ENDOSCOPIC PROCEDURE TODAY AT THE [facility]:   Refer to the procedure report that was given to you for any specific questions about what was found during the examination.  If the procedure report does not answer your questions, please call your gastroenterologist to clarify.  If you requested that your care partner not be given the details of your procedure findings, then the procedure report has been included in a sealed envelope for you to review at your convenience later.  YOU SHOULD EXPECT: Some feelings of bloating in the abdomen. Passage of more gas than usual.  Walking can help get rid of the air that was put into your GI tract during the procedure and reduce the bloating. If you had a lower endoscopy (such as a colonoscopy or flexible sigmoidoscopy) you may notice spotting of blood in your stool or on the toilet paper. If you underwent a bowel prep for your procedure, you may not have a normal bowel movement for a few days.  Please Note:  You might notice some irritation and congestion in your nose or some drainage.  This is from the oxygen used during your procedure.  There is no need for concern and it should clear up in a day or so.  SYMPTOMS TO REPORT IMMEDIATELY:   Following lower endoscopy (colonoscopy or flexible sigmoidoscopy):  Excessive amounts of blood in the stool  Significant tenderness or worsening of abdominal pains  Swelling of the abdomen that is new, acute  Fever of 100F or higher   For urgent or emergent issues, a gastroenterologist can be reached at any hour by calling [phone].   DIET:  We do recommend a small meal at first, but then you may proceed to your regular diet.  Drink plenty of fluids but you should avoid alcoholic beverages for 24 hours.  ACTIVITY:  You should plan to take it easy for the rest of today and you should NOT DRIVE or use heavy  machinery until tomorrow (because of the sedation medicines used during the test).    FOLLOW UP: Our staff will call the number listed on your records the next business day following your procedure to check on you and address any questions or concerns that you may have regarding the information given to you following your procedure. If we do not reach you, we will leave a message.  However, if you are feeling well and you are not experiencing any problems, there is no need to return our call.  We will assume that you have returned to your regular daily activities without incident.  If any biopsies were taken you will be contacted by phone or by letter within the next 1-3 weeks.  Please call us at [phone] if you have not heard about the biopsies in 3 weeks.    SIGNATURES/CONFIDENTIALITY: You and/or your care partner have signed paperwork which will be entered into your electronic medical record.  These signatures attest to the fact that that the information above on your After Visit Summary has been reviewed and is understood.  Full responsibility of the confidentiality of this discharge information lies with you and/or your care-partner. 

## 2018-05-14 NOTE — Op Note (Signed)
[facility] Patient Name: Rebecca Hicks Procedure Date: 05/14/2018 11:19 AM MRN: [id number] Endoscopist: Milus Banister , MD Age: 73 Referring MD:  Date of Birth: [birth year] Gender: Female Account #: [id number] Procedure:                Colonoscopy Indications:              High risk colon cancer surveillance: Personal                            history of colonic polyps; colonoscopy 2016 seven                            subCM adenomas removed Medicines:                Monitored Anesthesia Care Procedure:                Pre-Anesthesia Assessment:                           - Prior to the procedure, a History and Physical                            was performed, and patient medications and                            allergies were reviewed. The patient's tolerance of                            previous anesthesia was also reviewed. The risks                            and benefits of the procedure and the sedation                            options and risks were discussed with the patient.                            All questions were answered, and informed consent                            was obtained. Prior Anticoagulants: The patient has                            taken no previous anticoagulant or antiplatelet                            agents. ASA Grade Assessment: III - A patient with                            severe systemic disease. After reviewing the risks                            and benefits, the patient was deemed in  satisfactory condition to undergo the procedure.                           After obtaining informed consent, the colonoscope                            was passed under direct vision. Throughout the                            procedure, the patient's blood pressure, pulse, and                            oxygen saturations were monitored continuously. The                            Colonoscope was introduced through  the anus and                            advanced to the the cecum, identified by                            appendiceal orifice and ileocecal valve. The                            colonoscopy was performed without difficulty. The                            patient tolerated the procedure well. The quality                            of the bowel preparation was good. The ileocecal                            valve, appendiceal orifice, and rectum were                            photographed. Scope In: 11:23:43 AM Scope Out: 11:38:10 AM Scope Withdrawal Time: 0 hours 10 minutes 37 seconds  Total Procedure Duration: 0 hours 14 minutes 27 seconds  Findings:                 Three sessile polyps were found in the sigmoid                            colon, ascending colon and cecum. The polyps were 1                            to 3 mm in size. These polyps were removed with a                            cold snare. Resection and retrieval were complete.                           The exam was otherwise without abnormality on  direct and retroflexion views. Complications:            No immediate complications. Estimated blood loss:                            None. Estimated Blood Loss:     Estimated blood loss: none. Impression:               - Three 1 to 3 mm polyps in the sigmoid colon, in                            the ascending colon and in the cecum, removed with                            a cold snare. Resected and retrieved.                           - The examination was otherwise normal on direct                            and retroflexion views. Recommendation:           - Patient has a contact number available for                            emergencies. The signs and symptoms of potential                            delayed complications were discussed with the                            patient. Return to normal activities tomorrow.                             Written discharge instructions were provided to the                            patient.                           - Resume previous diet.                           - Continue present medications.                           You will receive a letter within 2-3 weeks with the                            pathology results and my final recommendations.                           If the polyp(s) is proven to be 'pre-cancerous' on                            pathology, you will need repeat colonoscopy  in 3-5                            years. Milus Banister, MD 05/14/2018 11:40:24 AM This report has been signed electronically.

## 2018-05-14 NOTE — Progress Notes (Signed)
Called to room to assist during endoscopic procedure.  Patient ID and intended procedure confirmed with present staff. Received instructions for my participation in the procedure from the performing physician.  

## 2018-05-14 NOTE — Progress Notes (Signed)
PT taken to PACU. Monitors in place. VSS. Report given to RN. 

## 2018-05-14 NOTE — Progress Notes (Signed)
Pt's states no medical or surgical changes since previsit or office visit. 

## 2018-05-15 ENCOUNTER — Telehealth: Payer: Self-pay

## 2018-05-15 NOTE — Telephone Encounter (Signed)
First post procedure follow up call, no answer 

## 2018-05-15 NOTE — Telephone Encounter (Signed)
  Follow up Call-  Call back number 05/14/2018  Post procedure Call Back phone  # [phone]  Permission to leave phone message Yes  Some recent data might be hidden     Patient questions:  Do you have a fever, pain , or abdominal swelling? No. Pain Score  0 *  Have you tolerated food without any problems? Yes.    Have you been able to return to your normal activities? Yes.    Do you have any questions about your discharge instructions: Diet   No. Medications  No. Follow up visit  No.  Do you have questions or concerns about your Care? No.  Actions: * If pain score is 4 or above: No action needed, pain <4.

## 2018-05-19 ENCOUNTER — Encounter: Payer: Self-pay | Admitting: Gastroenterology

## 2018-05-27 ENCOUNTER — Other Ambulatory Visit: Payer: Medicare Other

## 2018-06-04 ENCOUNTER — Ambulatory Visit: Payer: Medicare Other | Admitting: Family Medicine

## 2018-06-04 ENCOUNTER — Other Ambulatory Visit: Payer: Self-pay

## 2018-06-04 ENCOUNTER — Encounter: Payer: Self-pay | Admitting: Family Medicine

## 2018-06-04 DIAGNOSIS — R5383 Other fatigue: Secondary | ICD-10-CM

## 2018-06-04 DIAGNOSIS — N182 Chronic kidney disease, stage 2 (mild): Secondary | ICD-10-CM

## 2018-06-04 DIAGNOSIS — E1165 Type 2 diabetes mellitus with hyperglycemia: Secondary | ICD-10-CM | POA: Diagnosis not present

## 2018-06-04 DIAGNOSIS — D869 Sarcoidosis, unspecified: Secondary | ICD-10-CM

## 2018-06-04 DIAGNOSIS — R110 Nausea: Secondary | ICD-10-CM

## 2018-06-04 DIAGNOSIS — R112 Nausea with vomiting, unspecified: Secondary | ICD-10-CM | POA: Insufficient documentation

## 2018-06-04 DIAGNOSIS — Z794 Long term (current) use of insulin: Secondary | ICD-10-CM

## 2018-06-04 DIAGNOSIS — G4733 Obstructive sleep apnea (adult) (pediatric): Secondary | ICD-10-CM

## 2018-06-04 DIAGNOSIS — E1140 Type 2 diabetes mellitus with diabetic neuropathy, unspecified: Secondary | ICD-10-CM

## 2018-06-04 LAB — POCT GLYCOSYLATED HEMOGLOBIN (HGB A1C): HbA1c, POC (controlled diabetic range): 9 % — AB (ref 0.0–7.0)

## 2018-06-04 NOTE — Patient Instructions (Signed)
I will call with the x ray and blood test results.   Likely we will watch for another few weeks. Please do get your CPAP fixed: that may be contributing. Of course, we will do more testing if the problem persists or gets worse. The book I talked about was [employer] by [employer].  I don't know if you can find the book.  I think late 38's early 3's .

## 2018-06-05 ENCOUNTER — Encounter: Payer: Self-pay | Admitting: Family Medicine

## 2018-06-05 LAB — CMP14+EGFR
ALK PHOS: 97 IU/L (ref 39–117)
ALT: 10 IU/L (ref 0–32)
AST: 19 IU/L (ref 0–40)
Albumin/Globulin Ratio: 1.4 (ref 1.2–2.2)
Albumin: 4.1 g/dL (ref 3.7–4.7)
BILIRUBIN TOTAL: 0.3 mg/dL (ref 0.0–1.2)
BUN/Creatinine Ratio: 20 (ref 12–28)
BUN: 28 mg/dL — ABNORMAL HIGH (ref 8–27)
CO2: 22 mmol/L (ref 20–29)
Calcium: 9.5 mg/dL (ref 8.7–10.3)
Chloride: 100 mmol/L (ref 96–106)
Creatinine, Ser: 1.41 mg/dL — ABNORMAL HIGH (ref 0.57–1.00)
GFR calc Af Amer: 43 mL/min/{1.73_m2} — ABNORMAL LOW (ref >59)
GFR calc non Af Amer: 38 mL/min/{1.73_m2} — ABNORMAL LOW (ref >59)
Globulin, Total: 3 g/dL (ref 1.5–4.5)
Glucose: 217 mg/dL — ABNORMAL HIGH (ref 65–99)
POTASSIUM: 4.5 mmol/L (ref 3.5–5.2)
Sodium: 140 mmol/L (ref 134–144)
Total Protein: 7.1 g/dL (ref 6.0–8.5)

## 2018-06-05 LAB — CBC
Hematocrit: 40.8 % (ref 34.0–46.6)
Hemoglobin: 13.1 g/dL (ref 11.1–15.9)
MCH: 28.7 pg (ref 26.6–33.0)
MCHC: 32.1 g/dL (ref 31.5–35.7)
MCV: 90 fL (ref 79–97)
Platelets: 188 10*3/uL (ref 150–450)
RBC: 4.56 x10E6/uL (ref 3.77–5.28)
RDW: 14 % (ref 11.7–15.4)
WBC: 5.5 10*3/uL (ref 3.4–10.8)

## 2018-06-05 LAB — TSH: TSH: 2 u[IU]/mL (ref 0.450–4.500)

## 2018-06-05 NOTE — Assessment & Plan Note (Signed)
Recheck creat.  Doubt uremia.

## 2018-06-05 NOTE — Assessment & Plan Note (Signed)
Relatively new sx.  Longstanding DM so could be gastroparesis.  No pain so doubt pancreatitis or biliary disease.  Liver dysfunction could cause.

## 2018-06-05 NOTE — Assessment & Plan Note (Signed)
Will get CXR to RO flair.  Sx are atypical for her previous flairs.

## 2018-06-05 NOTE — Progress Notes (Signed)
Established Patient Office Visit  Subjective:  Patient ID: Rebecca Hicks, female    DOB: August 13, 1945  Age: 73 y.o. MRN: [id number]  CC:  Chief Complaint  Patient presents with  . chest congestion    HPI Rebecca Hicks presents for feeling bad for two months.  Illness begain in mid/late Dec with cough/bronchitis.  She coughed for about a month and still has a lingering mild cough and chest congestion.  (Was treated with antibiotics by Urgent Care.)Symptoms now are marked tiredness and sleepiness.  She sleeps 10-12 hours per day.  She also has nausea and perhaps some mild dysphagia.  Known DM with poor control.  Doing better recently.  Home FBS today was 179.  She has other chronic diseases that complicate the picture: 1. Sarcoidosis - had good check up with pulm in Nov prior to these sx beginning.  Normal CXR then.  Denies dyspnea which she usually gets with a sarcoid flair. 2. Known sleep apnea and her CPAP machine is malfunctioning.  When she said this, we both wondered if not using her CPAP may be causing poor sleep and excessive daytime sleepiness. No other focal sx on review of sx  Past Medical History:  Diagnosis Date  . Abdominal pain 12/05/2011  . Abdominal pain, left upper quadrant 12/03/2012  . Acute on chronic renal failure ([place]) 12/25/2012  . Arthritis   . 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 Ataxia 12/15/2015  . Atypical chest pain 03/28/2016  . Cataract   . CERUMEN IMPACTION, RIGHT 01/31/2010   Qualifier: Diagnosis of  By: Dianah Field MD, Marcello Moores    . Chest tightness 02/23/2014  . Chronic kidney disease (CKD), stage II (mild) 03/05/2013   Looks like baseline creat = 1.2 -1.3.  Watch for overdiuresis.   . COLONIC POLYPS, ADENOMATOUS 05/03/2009   Every five year colonoscopy due to adenomatous polyp found 04/2009    . DDD (degenerative disc disease), cervical 11/24/2016  . Diabetes mellitus   . Diabetic neuropathy, type II diabetes mellitus ([place]) 05/28/2008   Qualifier:  Diagnosis of  By: Andria Frames MD, Gwyndolyn Saxon    . Diarrhea 01/21/2015  . DM (diabetes mellitus), type 2, uncontrolled ([place]) 02/07/2007   Qualifier: Diagnosis of  By: Wynetta Emery RN, Doroteo Bradford    . 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 Essential hypertension 02/07/2007   Qualifier: Diagnosis of  By: Wynetta Emery RN, Doroteo Bradford    . Excessive sleepiness 04/07/2014  . Fatigue 05/04/2011  . Glaucoma   . GLAUCOMA NOS 02/25/2009   Qualifier: Diagnosis of  By: Andria Frames MD, Gwyndolyn Saxon    . Great toe pain 09/18/2012  . History of nuclear stress test    Myoview 11/17: EF 64, Normal pharmacologic nuclear stress test with no evidence of prior infarct or ischemia.  Marland Kitchen Hoarseness 10/31/2016  . Hyperlipidemia   . Hypertension   . LOW BACK PAIN SYNDROME 05/28/2008   Qualifier: Diagnosis of  By: Andria Frames MD, Gwyndolyn Saxon    . Morbid obesity ([place])   . Neck pain 11/24/2016  . Neuromuscular disorder ([place])   . Obesity hypoventilation syndrome ([place]) 11/10/2015  . Obstructive sleep apnea 11/17/2015  . Otalgia of right ear 11/24/2016  . Polyneuropathy in diabetes(357.2)   . Sarcoidosis   . Sinusitis acute 10/03/2010  . Sleep apnea    uses cpap  . 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 - She was fitted with  a Small size [employer] Full Face Mask Simplus mask and heated humidification.   . Snoring 09/05/2015  . TB SKIN TEST, POSITIVE 02/07/2007   Annotation: with clear CXR mid 1990's Qualifier: History of  By: Milana Obey, Doroteo Bradford    . TOBACCO USE, QUIT 02/07/2007   Qualifier: Diagnosis of  By: Andria Frames MD, Gwyndolyn Saxon    . Tremor of both hands 12/15/2015  . U R I 03/15/2010   Qualifier: Diagnosis of  By: Andria Frames MD, Gwyndolyn Saxon      Past Surgical History:  Procedure Laterality Date  . ABDOMINAL HYSTERECTOMY    . APPENDECTOMY    . CATARACT EXTRACTION, BILATERAL  03/2010, 04/2010  . COLONOSCOPY  2011  . EYE SURGERY    . POLYPECTOMY  2011    polyps, hems   . SPINE SURGERY     lumbar laminectomy X 2  . TONSILLECTOMY      Family History  Problem Relation Age of Onset  . Heart disease Brother   . Cervical cancer Mother   . Stroke Father   . Alcohol abuse Father   . Alcohol abuse Brother   . Pancreatic cancer Maternal Uncle   . Hypertension Other        siblings  . Diabetes Other        siblings  . Breast cancer Maternal Aunt   . Stroke Paternal Grandmother   . Colon cancer Neg Hx   . Esophageal cancer Neg Hx   . Rectal cancer Neg Hx   . Stomach cancer Neg Hx   . Colon polyps Neg Hx     Social History   Socioeconomic History  . Marital status: Single    Spouse name: Not on file  . Number of children: 0  . Years of education: college  . Highest education level: Not on file  Occupational History  . Occupation: retired    Fish farm manager: [employer]  . Financial resource strain: Not on file  . Food insecurity:    Worry: Not on file    Inability: Not on file  . Transportation needs:    Medical: Not on file    Non-medical: Not on file  Tobacco Use  . Smoking status: Former Smoker    Packs/day: 1.00    Years: 20.00    Pack years: 20.00    Types: Cigarettes    Last attempt to quit: 04/24/1995    Years since quitting: 23.1  . Smokeless tobacco: Never Used  Substance and Sexual Activity  . Alcohol use: Yes    Alcohol/week: 0.0 standard drinks    Comment: rarely  . Drug use: No  . Sexual activity: Not on file  Lifestyle  . Physical activity:    Days per week: Not on file    Minutes per session: Not on file  . Stress: Not on file  Relationships  . Social connections:    Talks on phone: Not on file    Gets together: Not on file    Attends religious service: Not on file    Active member of club or organization: Not on file    Attends meetings of clubs or organizations: Not on file    Relationship status: Not on file  . Intimate partner violence:    Fear of current or ex partner:  Not on file    Emotionally abused: Not on file    Physically abused: Not on file    Forced sexual activity: Not on file  Other Topics  Concern  . Not on file  Social History Narrative   Lives with 2 sisters and a nephew in a 2 story home but stays on the first floor.  Retired from the [employer].  Education: college.     Outpatient Medications Prior to Visit  Medication Sig Dispense Refill  . acetaminophen (TYLENOL) 650 MG CR tablet Take 650 mg by mouth.    Marland Kitchen albuterol (PROAIR HFA) 108 (90 Base) MCG/ACT inhaler Inhale 2 puffs into the lungs every 6 (six) hours as needed for wheezing or shortness of breath. 1 Inhaler 2  . amLODipine (NORVASC) 10 MG tablet TAKE 1 TABLET BY MOUTH  DAILY 90 tablet 3  . aspirin 81 MG tablet Take 81 mg by mouth daily.      . Blood Glucose Monitoring Suppl (ONE TOUCH ULTRA 2) w/Device KIT Test blood sugar three times per day. 1 each 0  . Blood Pressure Monitoring (BLOOD PRESSURE KIT) DEVI 1 Act by Does not apply route daily. 1 Device 0  . budesonide-formoterol (SYMBICORT) 160-4.5 MCG/ACT inhaler INHALE 2 PUFFS INTO THE&nbsp;&nbsp;LUNGS TWO TIMES DAILY 30.6 g 2  . cloNIDine (CATAPRES) 0.1 MG tablet Take 0.1 mg by mouth 2 (two) times daily.    . diphenoxylate-atropine (LOMOTIL) 2.5-0.025 MG tablet Take 1 tablet by mouth 4 (four) times daily as needed for diarrhea or loose stools. 120 tablet 6  . DULoxetine (CYMBALTA) 60 MG capsule Take 1 capsule (60 mg total) by mouth daily. 90 capsule 3  . empagliflozin (JARDIANCE) 25 MG TABS tablet Take 25 mg by mouth daily. 90 tablet 3  . furosemide (LASIX) 40 MG tablet Take 40 mg by mouth as needed.    . hydrochlorothiazide (HYDRODIURIL) 12.5 MG tablet Take 1 tablet (12.5 mg total) by mouth daily. 90 tablet 3  . ibuprofen (ADVIL,MOTRIN) 200 MG tablet Take 200 mg by mouth every 8 (eight) hours as needed.    . insulin degludec (TRESIBA FLEXTOUCH) 100 UNIT/ML SOPN FlexTouch Pen Inject 0.4 mLs (40 Units total) into the skin  daily. 36 ml should be 90 day supply.    . insulin lispro (HUMALOG) 100 UNIT/ML injection Inject 20-25 units twice daily with a meal 10 mL 3  . Insulin Pen Needle 31G X 8 MM MISC Use to inject insulin and Victoza 4 times daily 300 each 2  . liraglutide (VICTOZA) 18 MG/3ML SOPN Inject 1.8 mg into the skin daily.    Marland Kitchen losartan (COZAAR) 100 MG tablet Take 1 tablet (100 mg total) by mouth daily. 90 tablet 3  . metFORMIN (GLUCOPHAGE XR) 750 MG 24 hr tablet Take 1 tablet (750 mg total) by mouth 2 (two) times daily. 90 tablet 3  . metoprolol succinate (TOPROL-XL) 50 MG 24 hr tablet Take 1 tablet (50 mg total) by mouth every evening. Take with or immediately following a meal. 90 tablet 3  . nitroGLYCERIN (NITROSTAT) 0.4 MG SL tablet Place 1 tablet (0.4 mg total) under the tongue every 5 (five) minutes as needed for chest pain. (Patient not taking: Reported on 03/27/2018) 25 tablet 3  . ONE TOUCH ULTRA TEST test strip USE TO TEST BLOOD SUGAR 3  TIMES DAILY 300 each 3  . pregabalin (LYRICA) 50 MG capsule Take 1 capsule (50 mg total) by mouth 3 (three) times daily. 90 capsule 2  . rosuvastatin (CRESTOR) 10 MG tablet Take 10 mg by mouth daily.    Marland Kitchen spironolactone (ALDACTONE) 25 MG tablet TAKE 1 TABLET BY MOUTH  DAILY 90 tablet 3  .  telmisartan-hydrochlorothiazide (MICARDIS HCT) 80-12.5 MG tablet TAKE 1 TABLET BY MOUTH  DAILY (Patient not taking: Reported on 05/14/2018) 90 tablet 3  . timolol (TIMOPTIC) 0.25 % ophthalmic solution Place 1 drop into both eyes 2 (two) times daily. Per optho     . tolterodine (DETROL) 1 MG tablet Take 1 tablet (1 mg total) by mouth 2 (two) times daily. 60 tablet 2   No facility-administered medications prior to visit.     Allergies  Allergen Reactions  . Penicillins Rash and Other (See Comments)    Has patient had a PCN reaction causing immediate rash, facial/tongue/throat swelling, SOB or lightheadedness with hypotension: Yes Has patient had a PCN reaction causing severe rash  involving mucus membranes or skin necrosis: No Has patient had a PCN reaction that required hospitalization: No Has patient had a PCN reaction occurring within the last 10 years: Yes If all of the above answers are "NO", then may proceed with Cephalosporin use.     ROS Review of Systems    Objective:    Physical Exam  BP 130/66   Pulse 79   Temp 97.7 F (36.5 C) (Oral)   Ht 5' 5"  (1.651 m)   Wt 279 lb 12.8 oz (126.9 kg)   SpO2 97%   BMI 46.56 kg/m  Wt Readings from Last 3 Encounters:  06/04/18 279 lb 12.8 oz (126.9 kg)  05/14/18 285 lb (129.3 kg)  05/07/18 285 lb (129.3 kg)   VS noted.  Wt down a bit. No skin or conjunctival pallor Neck, no adenopathy Lungs clear Cardiac RRR without m or g Abd benign Ext bilateral 1+ edema.  There are no preventive care reminders to display for this patient.  There are no preventive care reminders to display for this patient.  Lab Results  Component Value Date   TSH 2.000 06/04/2018   Lab Results  Component Value Date   WBC 5.5 06/04/2018   HGB 13.1 06/04/2018   HCT 40.8 06/04/2018   MCV 90 06/04/2018   PLT 188 06/04/2018   Lab Results  Component Value Date   NA 140 06/04/2018   K 4.5 06/04/2018   CO2 22 06/04/2018   GLUCOSE 217 (H) 06/04/2018   BUN 28 (H) 06/04/2018   CREATININE 1.41 (H) 06/04/2018   BILITOT 0.3 06/04/2018   ALKPHOS 97 06/04/2018   AST 19 06/04/2018   ALT 10 06/04/2018   PROT 7.1 06/04/2018   ALBUMIN 4.1 06/04/2018   CALCIUM 9.5 06/04/2018   ANIONGAP 10 09/23/2017   GFR 53.85 (L) 08/23/2014   Lab Results  Component Value Date   CHOL 288 (H) 05/09/2017   Lab Results  Component Value Date   HDL 57 05/09/2017   Lab Results  Component Value Date   LDLCALC 210 (H) 05/09/2017   Lab Results  Component Value Date   TRIG 103 05/09/2017   Lab Results  Component Value Date   CHOLHDL 5.1 (H) 05/09/2017   Lab Results  Component Value Date   HGBA1C 9.0 (A) 06/04/2018      Assessment  & Plan:   Problem List Items Addressed This Visit    SARCOIDOSIS, PULMONARY   Relevant Orders   DG Chest 2 View   Nausea without vomiting   Relevant Orders   CBC (Completed)   Fatigue   Relevant Orders   TSH (Completed)   DM (diabetes mellitus), type 2, uncontrolled (HCC)   Relevant Orders   CMP14+EGFR (Completed)   POCT glycosylated hemoglobin (Hb A1C) (Completed)  No orders of the defined types were placed in this encounter.   Follow-up: No follow-ups on file.    Zenia Resides, MD

## 2018-06-05 NOTE — Assessment & Plan Note (Signed)
Still poor control.  She is working on it.

## 2018-06-05 NOTE — Assessment & Plan Note (Signed)
Likely that OSA and not using CPAP is contributing to some sx

## 2018-06-05 NOTE — Assessment & Plan Note (Signed)
Long differential.  Doubt this is all one illness.  Best clinical guess is that sx result from a series of minor clinical illnesses.  Plan is to do 1st order lab testing and if reassuring, follow.  Hopefully sx will resolve without further WU

## 2018-06-06 ENCOUNTER — Ambulatory Visit
Admission: RE | Admit: 2018-06-06 | Discharge: 2018-06-06 | Disposition: A | Discharge status: Home / Self Care | Payer: Medicare Other | Source: Ambulatory Visit | Attending: Family Medicine | Admitting: Family Medicine

## 2018-06-06 DIAGNOSIS — D869 Sarcoidosis, unspecified: Secondary | ICD-10-CM

## 2018-06-25 ENCOUNTER — Ambulatory Visit: Payer: Medicare Other

## 2018-06-25 ENCOUNTER — Other Ambulatory Visit: Payer: Medicare Other

## 2018-06-26 ENCOUNTER — Other Ambulatory Visit: Payer: Self-pay | Admitting: Family Medicine

## 2018-06-26 DIAGNOSIS — E11649 Type 2 diabetes mellitus with hypoglycemia without coma: Secondary | ICD-10-CM

## 2018-07-02 ENCOUNTER — Other Ambulatory Visit: Payer: Self-pay | Admitting: Family Medicine

## 2018-07-02 DIAGNOSIS — I10 Essential (primary) hypertension: Secondary | ICD-10-CM

## 2018-07-03 ENCOUNTER — Other Ambulatory Visit: Payer: Medicare Other

## 2018-07-04 ENCOUNTER — Other Ambulatory Visit: Payer: Medicare Other

## 2018-07-07 ENCOUNTER — Other Ambulatory Visit: Payer: Medicare Other

## 2018-07-08 ENCOUNTER — Telehealth: Payer: Self-pay | Admitting: Pharmacist

## 2018-07-08 DIAGNOSIS — E785 Hyperlipidemia, unspecified: Secondary | ICD-10-CM

## 2018-07-08 MED ORDER — ROSUVASTATIN CALCIUM 20 MG PO TABS: 20.0000 mg | ORAL_TABLET | Freq: Every day | ORAL | 11 refills | Status: DC

## 2018-07-08 NOTE — Telephone Encounter (Signed)
Spoke with pt regarding lipids - she did not tolerate rosuvastatin 40mg  daily and decreased her dose to 10mg  daily. She is tolerating well and willing to increase dose back to 20mg  daily. R/s labs for 3 months from now and can consider adding on Zetia or PCSK9i at that time if warranted.

## 2018-07-15 ENCOUNTER — Other Ambulatory Visit: Payer: Self-pay

## 2018-07-15 ENCOUNTER — Telehealth (INDEPENDENT_AMBULATORY_CARE_PROVIDER_SITE_OTHER): Payer: Medicare Other | Admitting: Student in an Organized Health Care Education/Training Program

## 2018-07-15 DIAGNOSIS — J069 Acute upper respiratory infection, unspecified: Secondary | ICD-10-CM | POA: Diagnosis not present

## 2018-07-15 MED ORDER — PREDNISONE 50 MG PO TABS: 50.0000 mg | ORAL_TABLET | Freq: Every day | ORAL | 0 refills | Status: AC

## 2018-07-15 MED ORDER — AZITHROMYCIN 500 MG PO TABS: ORAL_TABLET | ORAL | 0 refills | Status: DC

## 2018-07-15 NOTE — Assessment & Plan Note (Addendum)
In setting of current COVID-19 climate- I would consider patient very high risk for infection- precepted with Dr. Erin Hearing and based on symptoms described over the phone and review of treatments in the past for similar presentations-have decided to prescribe a course of steroids and antibiotics and recommend patient stay home and isolated as much as possible. Gave patient strict instructions of calling back if she develops a fever, any SOB or worsening breathing, or not improving with the treatment. Patient was very reasonable and agreeable to staying home and isolating which she has been trying to do already. -Prednisone 50mg  x3 days -Azithromycin 500mg  x3 days

## 2018-07-15 NOTE — Progress Notes (Signed)
[facility] Telemedicine Visit  Patient consented to have visit conducted via telephone.  Encounter participants: Patient: Rebecca Hicks  Provider: Richarda Osmond   Chief Complaint: sore throat/cough  HPI: Patient has had several weeks of dry cough. 1 week waking up with sore throat which seems to be getting worse last couple days. Cough has also gotten worse and is now producing a very thick yellow discharge which patient relates to presentation of frequent bronchitis infections. Since yesterday, she has felt increased chest congestion and has positive yellow nasal discharge as well. She has been able to maintain a good fluid intake and has tried gargling salt water with no relief.  Positive for chills. Negative for SOB/difficulty breathing/chest pain/diffiuclty swallowing/fevers. Positive sick contacts- lives with nephew recently diagnosed and treated for flu and strep throat. Also lives with sister who is currently hospitalized for unknown to patient infectious process.   ROS: denies GI symptoms, fever, travel  Pertinent PMHx: cpap- sleep apnea, pulmonary sarcoidosis and h/o frequent bronchitis.   Objective:  Patient was able to speak in full sentences without appearing SOB. She was alert and oriented.   Assessment/Plan:  Acute upper respiratory infection In setting of current COVID-19 climate- I would consider patient very high risk for infection- precepted with Dr. Erin Hearing and based on symptoms described over the phone and review of treatments in the past for similar presentations-have decided to prescribe a course of steroids and antibiotics and recommend patient stay home and isolated as much as possible. Gave patient strict instructions of calling back if she develops a fever, any SOB or worsening breathing, or not improving with the treatment. Patient was very reasonable and agreeable to staying home and isolating which she has been trying to do  already. -Prednisone 50mg  x3 days -Azithromycin 500mg  x3 days    Time spent on phone with patient: 20 minutes

## 2018-08-04 ENCOUNTER — Telehealth (INDEPENDENT_AMBULATORY_CARE_PROVIDER_SITE_OTHER): Payer: Medicare Other | Admitting: Family Medicine

## 2018-08-04 DIAGNOSIS — R05 Cough: Secondary | ICD-10-CM

## 2018-08-04 DIAGNOSIS — R059 Cough, unspecified: Secondary | ICD-10-CM | POA: Insufficient documentation

## 2018-08-04 MED ORDER — BENZONATATE 100 MG PO CAPS: 100.0000 mg | ORAL_CAPSULE | Freq: Three times a day (TID) | ORAL | 0 refills | Status: DC | PRN

## 2018-08-04 NOTE — Assessment & Plan Note (Signed)
Patient has lingering cough likely secondary to this now treated acute respiratory infection.  Suspect that patient's infection treated given improvement in her sputum.  No red flag symptoms.  Suspect sore throat is secondary to cough. -Honey as needed for cough, monitor blood sugar -Tessalon Perles 3 times daily as needed Rx sent -Patient given return precautions, she voiced understanding

## 2018-08-04 NOTE — Telephone Encounter (Signed)
[facility] Telemedicine Visit  Patient consented to have visit conducted via telephone.  Encounter participants: Patient: Rebecca Hicks  Provider: Bernita Raisin Meccariello  Others (if applicable): None  Chief Complaint: cough and sore throat  HPI: Patient recently treated for acute upper respiratory infection with 3 days of prednisone and azithromycin.  Patient states that she was feeling better after this, and that her sputum changed from thick and yellow to white and thinner.  She states that for the last week however she is continued to have cough and has had worsening sore throat, which seems to be around the same time as when her cough is worse.  She states that this is been making it difficult for her to wear her CPAP at night.  She denies any fevers, chest pain, shortness of breath or worsening breathing.  Patient has a history of sarcoidosis, but states that she does not have any worsening of her shortness of breath.  She has been using over-the-counter cough syrup without improvement.  Patient states "I know when I am getting bronchitis or pneumonia, and this does not feel like that."  ROS: Per HPI  Pertinent PMHx: Sarcoidosis, diabetes  Exam:  Respiratory: Speaking in complete sentences and no evidence of respiratory distress over the phone  Assessment/Plan:  Cough Patient has lingering cough likely secondary to this now treated acute respiratory infection.  Suspect that patient's infection treated given improvement in her sputum.  No red flag symptoms.  Suspect sore throat is secondary to cough. -Honey as needed for cough, monitor blood sugar -Tessalon Perles 3 times daily as needed Rx sent -Patient given return precautions, she voiced understanding    Time spent on phone with patient: 7 minutes

## 2018-09-02 ENCOUNTER — Telehealth: Payer: Self-pay

## 2018-09-02 NOTE — Telephone Encounter (Signed)
Called pt to set up evisit, pt had a death in the family and the service is on the day of the appt. Pt would like to be rescheduled for a date after 09/04/2018.

## 2018-09-05 ENCOUNTER — Telehealth: Payer: Self-pay | Admitting: Physician Assistant

## 2018-09-05 NOTE — Telephone Encounter (Signed)
Spoke with patient who confirmed all demographics. Patient has a smart phone and uses My Chart. Will have vitals ready for visit. °

## 2018-09-08 ENCOUNTER — Telehealth: Payer: Self-pay | Admitting: *Deleted

## 2018-09-08 ENCOUNTER — Telehealth (INDEPENDENT_AMBULATORY_CARE_PROVIDER_SITE_OTHER): Payer: Medicare Other | Admitting: Physician Assistant

## 2018-09-08 ENCOUNTER — Encounter: Payer: Self-pay | Admitting: Physician Assistant

## 2018-09-08 ENCOUNTER — Other Ambulatory Visit: Payer: Self-pay

## 2018-09-08 VITALS — BP 161/87 | HR 68 | Ht 66.0 in | Wt 277.0 lb

## 2018-09-08 DIAGNOSIS — I10 Essential (primary) hypertension: Secondary | ICD-10-CM

## 2018-09-08 DIAGNOSIS — E785 Hyperlipidemia, unspecified: Secondary | ICD-10-CM

## 2018-09-08 DIAGNOSIS — I2510 Atherosclerotic heart disease of native coronary artery without angina pectoris: Secondary | ICD-10-CM

## 2018-09-08 DIAGNOSIS — I2584 Coronary atherosclerosis due to calcified coronary lesion: Secondary | ICD-10-CM

## 2018-09-08 NOTE — Telephone Encounter (Signed)
..   Virtual Visit Pre-Appointment Phone Call  "(Name), I am calling you today to discuss your upcoming appointment. We are currently trying to limit exposure to the virus that causes COVID-19 by seeing patients at home rather than in the office."  1. "What is the BEST phone number to call the day of the visit?" - include this in appointment notes  2. Do you have or have access to (through a family member/friend) a smartphone with video capability that we can use for your visit?" a. If yes - list this number in appt notes as cell (if different from BEST phone #) and list the appointment type as a VIDEO visit in appointment notes b. If no - list the appointment type as a PHONE visit in appointment notes  3. Confirm consent - "In the setting of the current Covid19 crisis, you are scheduled for a (phone or video) visit with your provider on (date) at (time).  Just as we do with many in-office visits, in order for you to participate in this visit, we must obtain consent.  If you'd like, I can send this to your mychart (if signed up) or email for you to review.  Otherwise, I can obtain your verbal consent now.  All virtual visits are billed to your insurance company just like a normal visit would be.  By agreeing to a virtual visit, we'd like you to understand that the technology does not allow for your provider to perform an examination, and thus may limit your provider's ability to fully assess your condition. If your provider identifies any concerns that need to be evaluated in person, we will make arrangements to do so.  Finally, though the technology is pretty good, we cannot assure that it will always work on either your or our end, and in the setting of a video visit, we may have to convert it to a phone-only visit.  In either situation, we cannot ensure that we have a secure connection.  Are you willing to proceed?" STAFF: Did the patient verbally acknowledge consent to telehealth visit? Document  YES/NO here: YES  4. Advise patient to be prepared - "Two hours prior to your appointment, go ahead and check your blood pressure, pulse, oxygen saturation, and your weight (if you have the equipment to check those) and write them all down. When your visit starts, your provider will ask you for this information. If you have an Apple Watch or Kardia device, please plan to have heart rate information ready on the day of your appointment. Please have a pen and paper handy nearby the day of the visit as well."  5. Give patient instructions for MyChart download to smartphone OR Doximity/Doxy.me as below if video visit (depending on what platform provider is using)  6. Inform patient they will receive a phone call 15 minutes prior to their appointment time (may be from unknown caller ID) so they should be prepared to answer    TELEPHONE CALL NOTE  Rebecca Hicks has been deemed a candidate for a follow-up tele-health visit to limit community exposure during the Covid-19 pandemic. I spoke with the patient via phone to ensure availability of phone/video source, confirm preferred email & phone number, and discuss instructions and expectations.  I reminded SHANDRICKA MONROY to be prepared with any vital sign and/or heart rhythm information that could potentially be obtained via home monitoring, at the time of her visit. I reminded ALLAYAH RAINERI to expect a phone call prior  to her visit.  Juventino Slovak, CMA 09/08/2018 1:56 PM   IF USING DOXIMITY or DOXY.ME - The patient will receive a link just prior to their visit by text.     FULL LENGTH CONSENT FOR TELE-HEALTH VISIT   I hereby voluntarily request, consent and authorize [facility] and its employed or contracted physicians, physician assistants, nurse practitioners or other licensed health care professionals (the Practitioner), to provide me with telemedicine health care services (the Services") as deemed necessary by the treating  Practitioner. I acknowledge and consent to receive the Services by the Practitioner via telemedicine. I understand that the telemedicine visit will involve communicating with the Practitioner through live audiovisual communication technology and the disclosure of certain medical information by electronic transmission. I acknowledge that I have been given the opportunity to request an in-person assessment or other available alternative prior to the telemedicine visit and am voluntarily participating in the telemedicine visit.  I understand that I have the right to withhold or withdraw my consent to the use of telemedicine in the course of my care at any time, without affecting my right to future care or treatment, and that the Practitioner or I may terminate the telemedicine visit at any time. I understand that I have the right to inspect all information obtained and/or recorded in the course of the telemedicine visit and may receive copies of available information for a reasonable fee.  I understand that some of the potential risks of receiving the Services via telemedicine include:   Delay or interruption in medical evaluation due to technological equipment failure or disruption;  Information transmitted may not be sufficient (e.g. poor resolution of images) to allow for appropriate medical decision making by the Practitioner; and/or   In rare instances, security protocols could fail, causing a breach of personal health information.  Furthermore, I acknowledge that it is my responsibility to provide information about my medical history, conditions and care that is complete and accurate to the best of my ability. I acknowledge that Practitioner's advice, recommendations, and/or decision may be based on factors not within their control, such as incomplete or inaccurate data provided by me or distortions of diagnostic images or specimens that may result from electronic transmissions. I understand that the  practice of medicine is not an exact science and that Practitioner makes no warranties or guarantees regarding treatment outcomes. I acknowledge that I will receive a copy of this consent concurrently upon execution via email to the email address I last provided but may also request a printed copy by calling the office of [facility].    I understand that my insurance will be billed for this visit.   I have read or had this consent read to me.  I understand the contents of this consent, which adequately explains the benefits and risks of the Services being provided via telemedicine.   I have been provided ample opportunity to ask questions regarding this consent and the Services and have had my questions answered to my satisfaction.  I give my informed consent for the services to be provided through the use of telemedicine in my medical care  By participating in this telemedicine visit I agree to the above.

## 2018-09-08 NOTE — Patient Instructions (Signed)
Medication Instructions:  Your physician recommends that you continue on your current medications as directed. Please refer to the Current Medication list given to you today.   If you need a refill on your cardiac medications before your next appointment, please call your pharmacy.   Lab work: NONE ORDERED  TODAY   If you have labs (blood work) drawn today and your tests are completely normal, you will receive your results only by: Marland Kitchen MyChart Message (if you have MyChart) OR . A paper copy in the mail If you have any lab test that is abnormal or we need to change your treatment, we will call you to review the results.  Testing/Procedures: NONE ORDERED  TODAY   Follow-Up: At [facility], you and your health needs are our priority.  As part of our continuing mission to provide you with exceptional heart care, we have created designated Provider Care Teams.  These Care Teams include your primary Cardiologist (physician) and Advanced Practice Providers (APPs -  Physician Assistants and Nurse Practitioners) who all work together to provide you with the care you need, when you need it. You will need a follow up appointment in:  6 months.  Please call our office 2 months in advance to schedule this appointment.  You may see Mertie Moores, MD or one of the following Advanced Practice Providers on your designated Care Team: Richardson Dopp, PA-C [place], [place] . Daune Perch, NP  Any Other Special Instructions Will Be Listed Below (If Applicable).

## 2018-09-08 NOTE — Progress Notes (Signed)
Virtual Visit via Video Note   This visit type was conducted due to national recommendations for restrictions regarding the COVID-19 Pandemic (e.g. social distancing) in an effort to limit this patient's exposure and mitigate transmission in our community.  Due to her co-morbid illnesses, this patient is at least at moderate risk for complications without adequate follow up.  This format is felt to be most appropriate for this patient at this time.  All issues noted in this document were discussed and addressed.  A limited physical exam was performed with this format.  Please refer to the patient's chart for her consent to telehealth for [facility].   Date:  09/08/2018   ID:  Rebecca Hicks, DOB [birth year], MRN [id number]  Patient Location: Home Provider Location: Home  PCP:  Zenia Resides, MD  Cardiologist:  Mertie Moores, MD  Electrophysiologist:  None   Evaluation Performed:  Follow-Up Visit  Chief Complaint:  6 months follow up  History of Present Illness:    Rebecca Hicks is a 73 y.o. female with hx of HTN, HLD, DM, pulmonary sarcoidosis, CKD stage III and morbid obesity seen for follow up.   Normal myoview 02/2014 & 02/2016. Coronary artery calcification on CT of chest 09/2016.  Seen by me 12/2017 with some atypical chest pain. Echo with normal LVEF and grade 1 DD.   Dr. Redmond Baseman thought that her dysphonia was due to either gastroesophageal reflux disease or vocal cord atrophy.  Last seen by Dr. Acie Fredrickson 02/2017.  [name] has been doing well except under lots of family stress.  Many family member has been hospitalized recently due to medical condition other than COVID.  She is unable to take care of herself.  Missing medication intermittently.  She denies chest pain, shortness of breath, palpitation, lower extremity edema or melena.   The patient does not have symptoms concerning for COVID-19 infection (fever, chills, cough, or new shortness of breath).     Past Medical History:  Diagnosis Date  . Abdominal pain 12/05/2011  . Abdominal pain, left upper quadrant 12/03/2012  . Acute on chronic renal failure ([place]) 12/25/2012  . Arthritis   . 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 Ataxia 12/15/2015  . Atypical chest pain 03/28/2016  . Cataract   . CERUMEN IMPACTION, RIGHT 01/31/2010   Qualifier: Diagnosis of  By: Dianah Field MD, Marcello Moores    . Chest tightness 02/23/2014  . Chronic kidney disease (CKD), stage II (mild) 03/05/2013   Looks like baseline creat = 1.2 -1.3.  Watch for overdiuresis.   . COLONIC POLYPS, ADENOMATOUS 05/03/2009   Every five year colonoscopy due to adenomatous polyp found 04/2009    . DDD (degenerative disc disease), cervical 11/24/2016  . Diabetes mellitus   . Diabetic neuropathy, type II diabetes mellitus ([place]) 05/28/2008   Qualifier: Diagnosis of  By: Andria Frames MD, Gwyndolyn Saxon    . Diarrhea 01/21/2015  . DM (diabetes mellitus), type 2, uncontrolled ([place]) 02/07/2007   Qualifier: Diagnosis of  By: Wynetta Emery RN, Doroteo Bradford    . 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 Essential hypertension 02/07/2007   Qualifier: Diagnosis of  By: Wynetta Emery RN, Doroteo Bradford    . Excessive sleepiness 04/07/2014  . Fatigue 05/04/2011  . Glaucoma   . GLAUCOMA NOS 02/25/2009   Qualifier: Diagnosis of  By: Andria Frames MD, Gwyndolyn Saxon    . Great toe pain 09/18/2012  . History of nuclear stress test    Myoview 11/17: EF 64, Normal pharmacologic nuclear stress test with no evidence  of prior infarct or ischemia.  Marland Kitchen Hoarseness 10/31/2016  . Hyperlipidemia   . Hypertension   . LOW BACK PAIN SYNDROME 05/28/2008   Qualifier: Diagnosis of  By: Andria Frames MD, Gwyndolyn Saxon    . Morbid obesity ([place])   . Neck pain 11/24/2016  . Neuromuscular disorder ([place])   . Obesity hypoventilation syndrome ([place]) 11/10/2015  . Obstructive sleep apnea 11/17/2015  . Otalgia of right ear 11/24/2016  . Polyneuropathy in diabetes(357.2)   . Sarcoidosis   . Sinusitis acute 10/03/2010  . Sleep apnea    uses cpap  . Sleep-related  hypoventilation due to pulmonary parenchymal pathology 11/10/2015   Sleep study results from 08/02/16 Trial of CPAP therapy on 10 cm H2O and 3 liters oxygen. - She was fitted with a Small size [employer] Full Face Mask Simplus mask and heated humidification.   . Snoring 09/05/2015  . TB SKIN TEST, POSITIVE 02/07/2007   Annotation: with clear CXR mid 1990's Qualifier: History of  By: Milana Obey, Doroteo Bradford    . TOBACCO USE, QUIT 02/07/2007   Qualifier: Diagnosis of  By: Andria Frames MD, Gwyndolyn Saxon    . Tremor of both hands 12/15/2015  . U R I 03/15/2010   Qualifier: Diagnosis of  By: Andria Frames MD, Gwyndolyn Saxon     Past Surgical History:  Procedure Laterality Date  . ABDOMINAL HYSTERECTOMY    . APPENDECTOMY    . CATARACT EXTRACTION, BILATERAL  03/2010, 04/2010  . COLONOSCOPY  2011  . EYE SURGERY    . POLYPECTOMY  2011   polyps, hems   . SPINE SURGERY     lumbar laminectomy X 2  . TONSILLECTOMY       Current Meds  Medication Sig  . acetaminophen (TYLENOL) 650 MG CR tablet Take 650 mg by mouth.  Marland Kitchen albuterol (PROAIR HFA) 108 (90 Base) MCG/ACT inhaler Inhale 2 puffs into the lungs every 6 (six) hours as needed for wheezing or shortness of breath.  Marland Kitchen amLODipine (NORVASC) 10 MG tablet TAKE 1 TABLET BY MOUTH  DAILY  . aspirin 81 MG tablet Take 81 mg by mouth daily.    . benzonatate (TESSALON PERLES) 100 MG capsule Take 1 capsule (100 mg total) by mouth 3 (three) times daily as needed for cough.  . Blood Glucose Monitoring Suppl (ONE TOUCH ULTRA 2) w/Device KIT Test blood sugar three times per day.  . Blood Pressure Monitoring (BLOOD PRESSURE KIT) DEVI 1 Act by Does not apply route daily.  . budesonide-formoterol (SYMBICORT) 160-4.5 MCG/ACT inhaler INHALE 2 PUFFS INTO THE  LUNGS TWO TIMES DAILY  . cloNIDine (CATAPRES) 0.1 MG tablet Take 0.1 mg by mouth 2 (two) times daily.  . diphenoxylate-atropine (LOMOTIL) 2.5-0.025 MG tablet Take 1 tablet by mouth 4 (four) times daily as needed for diarrhea or loose stools.   . DULoxetine (CYMBALTA) 60 MG capsule Take 1 capsule (60 mg total) by mouth daily.  . empagliflozin (JARDIANCE) 25 MG TABS tablet Take 25 mg by mouth daily.  . furosemide (LASIX) 40 MG tablet Take 40 mg by mouth as needed.  Marland Kitchen HUMALOG KWIKPEN 100 UNIT/ML KwikPen INJECT 20 UNITS  SUBCUTANEOUSLY TWO TIMES  DAILY BEFORE MEALS  . hydrochlorothiazide (HYDRODIURIL) 12.5 MG tablet Take 1 tablet (12.5 mg total) by mouth daily.  Marland Kitchen ibuprofen (ADVIL,MOTRIN) 200 MG tablet Take 200 mg by mouth every 8 (eight) hours as needed.  . Insulin Pen Needle 31G X 8 MM MISC Use to inject insulin and Victoza 4 times daily  . losartan (COZAAR) 100 MG tablet  Take 1 tablet (100 mg total) by mouth daily.  . metFORMIN (GLUCOPHAGE XR) 750 MG 24 hr tablet Take 1 tablet (750 mg total) by mouth 2 (two) times daily.  . metoprolol succinate (TOPROL-XL) 50 MG 24 hr tablet Take 1 tablet (50 mg total) by mouth every evening. Take with or immediately following a meal.  . nitroGLYCERIN (NITROSTAT) 0.4 MG SL tablet Place 0.4 mg under the tongue every 5 (five) minutes as needed for chest pain.  . ONE TOUCH ULTRA TEST test strip USE TO TEST BLOOD SUGAR 3  TIMES DAILY  . pregabalin (LYRICA) 75 MG capsule Take 75 mg by mouth 2 (two) times daily.  . rosuvastatin (CRESTOR) 20 MG tablet Take 1 tablet (20 mg total) by mouth daily.  Marland Kitchen spironolactone (ALDACTONE) 25 MG tablet TAKE 1 TABLET BY MOUTH  DAILY  . timolol (TIMOPTIC) 0.5 % ophthalmic solution Place 1 drop into both eyes 2 times daily.  Marland Kitchen tolterodine (DETROL) 1 MG tablet Take 1 mg by mouth daily.  Tyler Aas FLEXTOUCH 100 UNIT/ML SOPN FlexTouch Pen Use once daily  . VICTOZA 18 MG/3ML SOPN INJECT SUBCUTANEOUSLY 1.8MG DAILY  . [DISCONTINUED] timolol (TIMOPTIC) 0.25 % ophthalmic solution Place 1 drop into both eyes 2 (two) times daily. Per optho   . [DISCONTINUED] tolterodine (DETROL) 1 MG tablet Take 1 tablet (1 mg total) by mouth 2 (two) times daily. (Patient taking differently: Take 1 mg by  mouth daily. )     Allergies:   Penicillins   Social History   Tobacco Use  . Smoking status: Former Smoker    Packs/day: 1.00    Years: 20.00    Pack years: 20.00    Types: Cigarettes    Last attempt to quit: 04/24/1995    Years since quitting: 23.3  . Smokeless tobacco: Never Used  Substance Use Topics  . Alcohol use: Yes    Alcohol/week: 0.0 standard drinks    Comment: rarely  . Drug use: No     Family Hx: The patient's family history includes Alcohol abuse in her brother and father; Breast cancer in her maternal aunt; Cervical cancer in her mother; Diabetes in an other family member; Heart disease in her brother; Hypertension in an other family member; Pancreatic cancer in her maternal uncle; Stroke in her father and paternal grandmother. There is no history of Colon cancer, Esophageal cancer, Rectal cancer, Stomach cancer, or Colon polyps.  ROS:   Please see the history of present illness.    All other systems reviewed and are negative.   Prior CV studies:   The following studies were reviewed today:  As summarized above  Labs/Other Tests and Data Reviewed:    EKG:  No ECG reviewed.  Recent Labs: 06/04/2018: ALT 10; BUN 28; Creatinine, Ser 1.41; Hemoglobin 13.1; Platelets 188; Potassium 4.5; Sodium 140; TSH 2.000   Recent Lipid Panel Lab Results  Component Value Date/Time   CHOL 288 (H) 05/09/2017 10:25 AM   TRIG 103 05/09/2017 10:25 AM   HDL 57 05/09/2017 10:25 AM   CHOLHDL 5.1 (H) 05/09/2017 10:25 AM   CHOLHDL 4.1 01/20/2015 10:03 AM   LDLCALC 210 (H) 05/09/2017 10:25 AM   LDLDIRECT 223 (H) 10/13/2015 09:32 AM    Wt Readings from Last 3 Encounters:  09/08/18 277 lb (125.6 kg)  06/04/18 279 lb 12.8 oz (126.9 kg)  05/14/18 285 lb (129.3 kg)     Objective:    Vital Signs:  BP (!) 161/87   Pulse 68  Ht 5' 6"  (1.676 m)   Wt 277 lb (125.6 kg)   BMI 44.71 kg/m    VITAL SIGNS:  reviewed GEN:  no acute distress EYES:  sclerae anicteric, EOMI -  Extraocular Movements Intact RESPIRATORY:  normal respiratory effort, symmetric expansion CARDIOVASCULAR:  no peripheral edema SKIN:  no rash, lesions or ulcers. MUSCULOSKELETAL:  no obvious deformities. NEURO:  alert and oriented x 3, no obvious focal deficit PSYCH:  normal affect  ASSESSMENT & PLAN:    1. HTN -Elevated.  Patient has been missing medication intermittently.  She will try to get herself situated.  She will keep log of her blood pressure and will give [place] a call if constant elevation.  2. Coronary artery calcification -No angina.  No change in therapy.  3. HLD - Hx of statin intolerance. Followed by lipid clinic.   4. Morbid obesity -Under a lot of family stress.  Encourage weight loss and regular exercise.   COVID-19 Education: The signs and symptoms of COVID-19 were discussed with the patient and how to seek care for testing (follow up with PCP or arrange E-visit). The importance of social distancing was discussed today.  Time:   Today, I have spent 10  minutes with the patient with telehealth technology discussing the above problems.     Medication Adjustments/Labs and Tests Ordered: Current medicines are reviewed at length with the patient today.  Concerns regarding medicines are outlined above.   Tests Ordered: No orders of the defined types were placed in this encounter.   Medication Changes: No orders of the defined types were placed in this encounter.   Disposition:  Follow up in 6 month(s)  Signed, Leanor Kail, PA  09/08/2018 2:22 PM    [facility]

## 2018-09-23 ENCOUNTER — Ambulatory Visit: Payer: Medicare Other | Admitting: Acute Care

## 2018-09-23 NOTE — Progress Notes (Deleted)
History of Present Illness Rebecca Hicks is a 72 y.o. female with ***   09/23/2018  Test Results:  CBC Latest Ref Rng & Units 06/04/2018 09/23/2017 08/22/2016  WBC 3.4 - 10.8 x10E3/uL 5.5 6.7 7.7  Hemoglobin 11.1 - 15.9 g/dL 13.1 13.3 13.0  Hematocrit 34.0 - 46.6 % 40.8 43.4 40.6  Platelets 150 - 450 x10E3/uL 188 190 190    BMP Latest Ref Rng & Units 06/04/2018 03/27/2018 03/03/2018  Glucose 65 - 99 mg/dL 217(H) 212(H) 171(H)  BUN 8 - 27 mg/dL 28(H) 33(H) 33(H)  Creatinine 0.57 - 1.00 mg/dL 1.41(H) 1.42(H) 1.68(H)  BUN/Creat Ratio 12 - 28 20 23 20   Sodium 134 - 144 mmol/L 140 136 141  Potassium 3.5 - 5.2 mmol/L 4.5 4.8 4.7  Chloride 96 - 106 mmol/L 100 98 103  CO2 20 - 29 mmol/L 22 21 20   Calcium 8.7 - 10.3 mg/dL 9.5 9.3 9.4    BNP    Component Value Date/Time   BNP 23.7 02/28/2016 1553    ProBNP No results found for: PROBNP  PFT No results found for: FEV1PRE, FEV1POST, FVCPRE, FVCPOST, TLC, DLCOUNC, PREFEV1FVCRT, PSTFEV1FVCRT  No results found.   Past medical hx Past Medical History:  Diagnosis Date  . Abdominal pain 12/05/2011  . Abdominal pain, left upper quadrant 12/03/2012  . Acute on chronic renal failure ([place]) 12/25/2012  . Arthritis   . 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 Ataxia 12/15/2015  . Atypical chest pain 03/28/2016  . Cataract   . CERUMEN IMPACTION, RIGHT 01/31/2010   Qualifier: Diagnosis of  By: Dianah Field MD, Marcello Moores    . Chest tightness 02/23/2014  . Chronic kidney disease (CKD), stage II (mild) 03/05/2013   Looks like baseline creat = 1.2 -1.3.  Watch for overdiuresis.   . COLONIC POLYPS, ADENOMATOUS 05/03/2009   Every five year colonoscopy due to adenomatous polyp found 04/2009    . DDD (degenerative disc disease), cervical 11/24/2016  . Diabetes mellitus   . Diabetic neuropathy, type II diabetes mellitus ([place]) 05/28/2008   Qualifier: Diagnosis of  By: Andria Frames MD, Gwyndolyn Saxon    . Diarrhea 01/21/2015  . DM (diabetes mellitus), type 2,  uncontrolled ([place]) 02/07/2007   Qualifier: Diagnosis of  By: Wynetta Emery RN, Doroteo Bradford    . 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 Essential hypertension 02/07/2007   Qualifier: Diagnosis of  By: Wynetta Emery RN, Doroteo Bradford    . Excessive sleepiness 04/07/2014  . Fatigue 05/04/2011  . Glaucoma   . GLAUCOMA NOS 02/25/2009   Qualifier: Diagnosis of  By: Andria Frames MD, Gwyndolyn Saxon    . Great toe pain 09/18/2012  . History of nuclear stress test    Myoview 11/17: EF 64, Normal pharmacologic nuclear stress test with no evidence of prior infarct or ischemia.  Marland Kitchen Hoarseness 10/31/2016  . Hyperlipidemia   . Hypertension   . LOW BACK PAIN SYNDROME 05/28/2008   Qualifier: Diagnosis of  By: Andria Frames MD, Gwyndolyn Saxon    . Morbid obesity ([place])   . Neck pain 11/24/2016  . Neuromuscular disorder ([place])   . Obesity hypoventilation syndrome ([place]) 11/10/2015  . Obstructive sleep apnea 11/17/2015  . Otalgia of right ear 11/24/2016  . Polyneuropathy in diabetes(357.2)   . Sarcoidosis   . Sinusitis acute 10/03/2010  . Sleep apnea    uses cpap  . 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 - She was  fitted with a Small size [employer] Full Face Mask Simplus mask and heated humidification.   . Snoring 09/05/2015  . TB SKIN TEST, POSITIVE 02/07/2007   Annotation: with clear CXR mid 1990's Qualifier: History of  By: Milana Obey, Doroteo Bradford    . TOBACCO USE, QUIT 02/07/2007   Qualifier: Diagnosis of  By: Andria Frames MD, Gwyndolyn Saxon    . Tremor of both hands 12/15/2015  . U R I 03/15/2010   Qualifier: Diagnosis of  By: Andria Frames MD, Gwyndolyn Saxon       Social History   Tobacco Use  . Smoking status: Former Smoker    Packs/day: 1.00    Years: 20.00    Pack years: 20.00    Types: Cigarettes    Last attempt to quit: 04/24/1995    Years since quitting: 23.4  . Smokeless tobacco: Never Used  Substance Use Topics  . Alcohol use: Yes    Alcohol/week:  0.0 standard drinks    Comment: rarely  . Drug use: No    Ms.Saidi reports that she quit smoking about 23 years ago. Her smoking use included cigarettes. She has a 20.00 pack-year smoking history. She has never used smokeless tobacco. She reports current alcohol use. She reports that she does not use drugs.  Tobacco Cessation: Counseling given: Not Answered   Past surgical hx, Family hx, Social hx all reviewed.  Current Outpatient Medications on File Prior to Visit  Medication Sig  . acetaminophen (TYLENOL) 650 MG CR tablet Take 650 mg by mouth.  Marland Kitchen albuterol (PROAIR HFA) 108 (90 Base) MCG/ACT inhaler Inhale 2 puffs into the lungs every 6 (six) hours as needed for wheezing or shortness of breath.  Marland Kitchen amLODipine (NORVASC) 10 MG tablet TAKE 1 TABLET BY MOUTH  DAILY  . aspirin 81 MG tablet Take 81 mg by mouth daily.    . benzonatate (TESSALON PERLES) 100 MG capsule Take 1 capsule (100 mg total) by mouth 3 (three) times daily as needed for cough.  . Blood Glucose Monitoring Suppl (ONE TOUCH ULTRA 2) w/Device KIT Test blood sugar three times per day.  . Blood Pressure Monitoring (BLOOD PRESSURE KIT) DEVI 1 Act by Does not apply route daily.  . budesonide-formoterol (SYMBICORT) 160-4.5 MCG/ACT inhaler INHALE 2 PUFFS INTO THE&nbsp;&nbsp;LUNGS TWO TIMES DAILY  . cloNIDine (CATAPRES) 0.1 MG tablet Take 0.1 mg by mouth 2 (two) times daily.  . diphenoxylate-atropine (LOMOTIL) 2.5-0.025 MG tablet Take 1 tablet by mouth 4 (four) times daily as needed for diarrhea or loose stools.  . DULoxetine (CYMBALTA) 60 MG capsule Take 1 capsule (60 mg total) by mouth daily.  . empagliflozin (JARDIANCE) 25 MG TABS tablet Take 25 mg by mouth daily.  . furosemide (LASIX) 40 MG tablet Take 40 mg by mouth as needed.  Marland Kitchen HUMALOG KWIKPEN 100 UNIT/ML KwikPen INJECT 20 UNITS  SUBCUTANEOUSLY TWO TIMES  DAILY BEFORE MEALS  . hydrochlorothiazide (HYDRODIURIL) 12.5 MG tablet Take 1 tablet (12.5 mg total) by mouth daily.  Marland Kitchen  ibuprofen (ADVIL,MOTRIN) 200 MG tablet Take 200 mg by mouth every 8 (eight) hours as needed.  . Insulin Pen Needle 31G X 8 MM MISC Use to inject insulin and Victoza 4 times daily  . losartan (COZAAR) 100 MG tablet Take 1 tablet (100 mg total) by mouth daily.  . metFORMIN (GLUCOPHAGE XR) 750 MG 24 hr tablet Take 1 tablet (750 mg total) by mouth 2 (two) times daily.  . metoprolol succinate (TOPROL-XL) 50 MG 24 hr tablet Take 1 tablet (50 mg total) by  mouth every evening. Take with or immediately following a meal.  . nitroGLYCERIN (NITROSTAT) 0.4 MG SL tablet Place 0.4 mg under the tongue every 5 (five) minutes as needed for chest pain.  . ONE TOUCH ULTRA TEST test strip USE TO TEST BLOOD SUGAR 3  TIMES DAILY  . pregabalin (LYRICA) 75 MG capsule Take 75 mg by mouth 2 (two) times daily.  . rosuvastatin (CRESTOR) 20 MG tablet Take 1 tablet (20 mg total) by mouth daily.  Marland Kitchen spironolactone (ALDACTONE) 25 MG tablet TAKE 1 TABLET BY MOUTH  DAILY  . timolol (TIMOPTIC) 0.5 % ophthalmic solution Place 1 drop into both eyes 2 times daily.  Marland Kitchen tolterodine (DETROL) 1 MG tablet Take 1 mg by mouth daily.  Tyler Aas FLEXTOUCH 100 UNIT/ML SOPN FlexTouch Pen Use once daily  . VICTOZA 18 MG/3ML SOPN INJECT SUBCUTANEOUSLY 1.8MG DAILY   No current facility-administered medications on file prior to visit.      Allergies  Allergen Reactions  . Penicillins Rash and Other (See Comments)    Has patient had a PCN reaction causing immediate rash, facial/tongue/throat swelling, SOB or lightheadedness with hypotension: Yes Has patient had a PCN reaction causing severe rash involving mucus membranes or skin necrosis: No Has patient had a PCN reaction that required hospitalization: No Has patient had a PCN reaction occurring within the last 10 years: Yes If all of the above answers are "NO", then may proceed with Cephalosporin use.     Review Of Systems:  Constitutional:   No  weight loss, night sweats,  Fevers, chills,  fatigue, or  lassitude.  HEENT:   No headaches,  Difficulty swallowing,  Tooth/dental problems, or  Sore throat,                No sneezing, itching, ear ache, nasal congestion, post nasal drip,   CV:  No chest pain,  Orthopnea, PND, swelling in lower extremities, anasarca, dizziness, palpitations, syncope.   GI  No heartburn, indigestion, abdominal pain, nausea, vomiting, diarrhea, change in bowel habits, loss of appetite, bloody stools.   Resp: No shortness of breath with exertion or at rest.  No excess mucus, no productive cough,  No non-productive cough,  No coughing up of blood.  No change in color of mucus.  No wheezing.  No chest wall deformity  Skin: no rash or lesions.  GU: no dysuria, change in color of urine, no urgency or frequency.  No flank pain, no hematuria   MS:  No joint pain or swelling.  No decreased range of motion.  No back pain.  Psych:  No change in mood or affect. No depression or anxiety.  No memory loss.   Vital Signs There were no vitals taken for this visit.   Physical Exam:  General- No distress,  A&Ox3 ENT: No sinus tenderness, TM clear, pale nasal mucosa, no oral exudate,no post nasal drip, no LAN Cardiac: S1, S2, regular rate and rhythm, no murmur Chest: No wheeze/ rales/ dullness; no accessory muscle use, no nasal flaring, no sternal retractions Abd.: Soft Non-tender Ext: No clubbing cyanosis, edema Neuro:  normal strength Skin: No rashes, warm and dry Psych: normal mood and behavior   Assessment/Plan  No problem-specific Assessment & Plan notes found for this encounter.    Magdalen Spatz, NP 09/23/2018  2:05 PM

## 2018-09-24 ENCOUNTER — Other Ambulatory Visit: Payer: Self-pay | Admitting: Family Medicine

## 2018-09-24 DIAGNOSIS — I10 Essential (primary) hypertension: Secondary | ICD-10-CM

## 2018-10-03 ENCOUNTER — Telehealth: Payer: Self-pay

## 2018-10-03 NOTE — Telephone Encounter (Signed)
    COVID-19 Pre-Screening Questions:  . In the past 7 to 10 days have you had a cough,  shortness of breath, headache, congestion, fever (100 or greater) body aches, chills, sore throat, or sudden loss of taste or sense of smell? . Have you been around anyone with known Covid 19. . Have you been around anyone who is awaiting Covid 19 test results in the past 7 to 10 days? . Have you been around anyone who has been exposed to Covid 19, or has mentioned symptoms of Covid 19 within the past 7 to 10 days?  If you have any concerns/questions about symptoms patients report during screening (either on the phone or at [facility]). Contact the provider seeing the patient or DOD for further guidance.  If neither are available contact a member of the leadership team.          Pt answered NO to all pre-screening questions. klb [id number] 10/03/2018

## 2018-10-06 ENCOUNTER — Other Ambulatory Visit: Payer: Self-pay

## 2018-10-06 ENCOUNTER — Encounter: Payer: Self-pay | Admitting: Family Medicine

## 2018-10-06 ENCOUNTER — Telehealth (INDEPENDENT_AMBULATORY_CARE_PROVIDER_SITE_OTHER): Payer: Medicare Other | Admitting: Family Medicine

## 2018-10-06 ENCOUNTER — Other Ambulatory Visit: Payer: Medicare Other

## 2018-10-06 ENCOUNTER — Telehealth: Payer: Self-pay | Admitting: *Deleted

## 2018-10-06 DIAGNOSIS — Z20828 Contact with and (suspected) exposure to other viral communicable diseases: Secondary | ICD-10-CM

## 2018-10-06 DIAGNOSIS — Z20822 Contact with and (suspected) exposure to covid-19: Secondary | ICD-10-CM | POA: Insufficient documentation

## 2018-10-06 DIAGNOSIS — Z794 Long term (current) use of insulin: Secondary | ICD-10-CM

## 2018-10-06 DIAGNOSIS — E1165 Type 2 diabetes mellitus with hyperglycemia: Secondary | ICD-10-CM

## 2018-10-06 DIAGNOSIS — E1140 Type 2 diabetes mellitus with diabetic neuropathy, unspecified: Secondary | ICD-10-CM | POA: Diagnosis not present

## 2018-10-06 DIAGNOSIS — E7800 Pure hypercholesterolemia, unspecified: Secondary | ICD-10-CM | POA: Diagnosis not present

## 2018-10-06 DIAGNOSIS — F4321 Adjustment disorder with depressed mood: Secondary | ICD-10-CM | POA: Insufficient documentation

## 2018-10-06 NOTE — Telephone Encounter (Signed)
Left VM for pt to CB for covid 19 testing.    Zenia Resides, MD  [facility] Testing Pool        COVID Drive-Up Test Referral Criteria   Patient age: 73 y.o.   Symptoms:  New loss of smell or taste   Underlying Conditions:  Heart failure, CAD, HTN, Severe obesity, and Diabetes   Is the patient a first responder?  No   Does the patient live or work in a high risk or high density environment:  No   Is the patient a COVID convalescent patient who is 14-28 days symptom-free and interested in donating plasma for use as a therapeutic product? No   Close contact (cousin) tested positve

## 2018-10-06 NOTE — Progress Notes (Signed)
Agrees to video visit, Doximity Several issues 1. Grief/adjustment reaction.  Younger brother was shot and killed (drug related?) 3 weeks ago.  Younger sister, mid-forties died yesterday of chronic heart problems.  Both deaths were sudden and unexpected.  She is obviously hurt and seems to be coping.   2. Exposed to [place] + cousin.  Now has anosmia.  Desires cOVID testing and I agree indicated.  No fever, chest tightness, cough or SOB. 3. Sarcoid flair?  Only skin lesions.  No pulm symptoms.  Will avoid steroids for now. 4. DM needs A1C.  Weight continues to come down despite recent comfort eating.  Diligent with insulin dosing based on blood sugar.  She will come in for A1C after COVID test is neg 5. High Cholesterol.  On crestor 20 mg daily.  Needs lipid panel.  Will come in after covid test neg  Duration of visit was 22 minutes.

## 2018-10-06 NOTE — Assessment & Plan Note (Signed)
A1c

## 2018-10-06 NOTE — Assessment & Plan Note (Signed)
Seems to be coping.  I will follow up with face to face in 2-4 weeks.

## 2018-10-06 NOTE — Assessment & Plan Note (Signed)
Will order test

## 2018-10-06 NOTE — Assessment & Plan Note (Signed)
Lipid panel 

## 2018-10-07 ENCOUNTER — Telehealth: Payer: Self-pay | Admitting: Family Medicine

## 2018-10-07 ENCOUNTER — Telehealth: Payer: Self-pay | Admitting: *Deleted

## 2018-10-07 ENCOUNTER — Other Ambulatory Visit: Payer: Self-pay

## 2018-10-07 DIAGNOSIS — Z20822 Contact with and (suspected) exposure to covid-19: Secondary | ICD-10-CM

## 2018-10-07 NOTE — Telephone Encounter (Signed)
See TE with Honor Loh for appointment and order information.

## 2018-10-07 NOTE — Telephone Encounter (Signed)
-----   Message from Zenia Resides, MD sent at 10/06/2018  9:47 AM EDT ----- COVID Drive-Up Test Referral Criteria  Patient age: 73 y.o.  Symptoms:  New loss of smell or taste  Underlying Conditions: Heart failure, CAD, HTN, Severe obesity, and Diabetes  Is the patient a first responder? No  Does the patient live or work in a high risk or high density environment: No  Is the patient a COVID convalescent patient who is 14-28 days symptom-free and interested in donating plasma for use as a therapeutic product? No  Close contact (cousin) tested positve

## 2018-10-07 NOTE — Telephone Encounter (Signed)
Scheduled for Covid testing today at 3:30 @ [facility]. Order placed.

## 2018-10-09 LAB — NOVEL CORONAVIRUS, NAA: SARS-CoV-2, NAA: NOT DETECTED

## 2018-10-23 ENCOUNTER — Other Ambulatory Visit: Payer: Medicare Other

## 2018-10-28 ENCOUNTER — Telehealth: Payer: Self-pay | Admitting: Pharmacist

## 2018-10-28 NOTE — Telephone Encounter (Signed)
Left message to reschedule lipid panel.

## 2018-10-31 NOTE — Telephone Encounter (Signed)
Left message again.

## 2018-11-03 ENCOUNTER — Other Ambulatory Visit (INDEPENDENT_AMBULATORY_CARE_PROVIDER_SITE_OTHER): Payer: Medicare Other

## 2018-11-03 ENCOUNTER — Other Ambulatory Visit: Payer: Self-pay

## 2018-11-03 DIAGNOSIS — Z794 Long term (current) use of insulin: Secondary | ICD-10-CM

## 2018-11-03 DIAGNOSIS — E1140 Type 2 diabetes mellitus with diabetic neuropathy, unspecified: Secondary | ICD-10-CM

## 2018-11-03 DIAGNOSIS — E7800 Pure hypercholesterolemia, unspecified: Secondary | ICD-10-CM

## 2018-11-03 LAB — POCT GLYCOSYLATED HEMOGLOBIN (HGB A1C): HbA1c, POC (controlled diabetic range): 9.6 % — AB (ref 0.0–7.0)

## 2018-11-04 LAB — LIPID PANEL
Chol/HDL Ratio: 3.3 ratio (ref 0.0–4.4)
Cholesterol, Total: 207 mg/dL — ABNORMAL HIGH (ref 100–199)
HDL: 62 mg/dL (ref >39)
LDL Calculated: 126 mg/dL — ABNORMAL HIGH (ref 0–99)
Triglycerides: 97 mg/dL (ref 0–149)
VLDL Cholesterol Cal: 19 mg/dL (ref 5–40)

## 2018-11-06 ENCOUNTER — Ambulatory Visit: Payer: Medicare Other | Admitting: Family Medicine

## 2018-11-10 ENCOUNTER — Telehealth: Payer: Self-pay | Admitting: Pharmacist

## 2018-11-10 MED ORDER — REPATHA SURECLICK 140 MG/ML ~~LOC~~ SOAJ: 1.0000 "pen " | SUBCUTANEOUS | 11 refills | Status: DC

## 2018-11-10 NOTE — Telephone Encounter (Signed)
Called pt to discuss lipid panel results. She is tolerating rosuvastatin 20mg  daily well (previously intolerant to 40mg  dose). LDL has improved from 210 to 126. LDL goal < 70 due to CAD and DM. Discussed PCSK9i with pt and she is agreeable to trying Repatha. Will submit prior authorization.

## 2018-11-10 NOTE — Telephone Encounter (Signed)
Prior authorization approved through 05/13/19. Rx sent to pharmacy, copay affordable at $24/month. Pt is aware. She will call clinic once she starts on injections so we can schedule follow up lab work.

## 2018-11-12 ENCOUNTER — Other Ambulatory Visit: Payer: Self-pay | Admitting: Family Medicine

## 2018-11-12 DIAGNOSIS — E11649 Type 2 diabetes mellitus with hypoglycemia without coma: Secondary | ICD-10-CM

## 2018-11-20 ENCOUNTER — Other Ambulatory Visit: Payer: Self-pay

## 2018-11-20 NOTE — Patient Outreach (Signed)
[facility]) Care Management  11/20/2018  Rebecca Hicks 30-Dec-1945 [id number]   Medication Adherence call to Rebecca Hicks [facility] Identifiers Verify spoke with patient she is past due on Losartan 100 mg and Rosuvastatin 20 mg patient explain she was only taking 1/2 of tablet on rosuvastatin for 2 month and on Losartan patient pick up 3 weeks ago and has plenty at this time. Rebecca Hicks is showing past due under [place].   [facility] Direct Dial [phone]  Fax [phone] [email or web address]

## 2018-11-25 ENCOUNTER — Other Ambulatory Visit: Payer: Self-pay

## 2018-11-25 ENCOUNTER — Ambulatory Visit: Payer: Medicare Other | Admitting: Emergency Medicine

## 2018-11-25 ENCOUNTER — Encounter: Payer: Self-pay | Admitting: Emergency Medicine

## 2018-11-25 DIAGNOSIS — D869 Sarcoidosis, unspecified: Secondary | ICD-10-CM | POA: Diagnosis not present

## 2018-11-25 DIAGNOSIS — G4733 Obstructive sleep apnea (adult) (pediatric): Secondary | ICD-10-CM | POA: Diagnosis not present

## 2018-11-25 NOTE — Progress Notes (Signed)
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  Chest x-ray done 05/27/2018 reviewed by me.  Did not show any infiltrates or progression of interstitial lung disease, there was some hypoinflation at the bases.  Her brother and sister have both died since last visit - very tough time for her family. She has some extra pressure on her, poor compliance w her medical regimens. She has lost 30+ lbs  She has some rash outbreaks since last time, has also had some increased dyspnea. She has received steroids for back pain, again for resp sx in March. Rash has cleared. She has not been using her O2 or CPAP for months. She has symbicort, is using about qd. Rare albuterol use. Uses zyrtec prn. Uses pepcid AC.    Exam:  Vitals:   11/25/18 1057  BP: 124/74  Pulse: 68  SpO2: 98%  Weight: 266 lb (120.7 kg)  Height: 5\' 6"  (1.676 m)    Gen: Pleasant, obese, in no distress,  normal affect, obese   ENT: No lesions,  mouth clear,  oropharynx clear, no postnasal drip  Neck: No JVD, no Stridor  Lungs: No use of accessory muscles, somewhat distant, no crackles or wheezing  Cardiovascular: RRR, heart sounds normal, no murmur or gallops, no peripheral edema  Musculoskeletal: No deformities, no cyanosis or clubbing  Neuro: alert, non focal  Skin: no rash   09/05/16 CT chest  COMPARISON:  Chest CT 03/01/2015.  FINDINGS: Cardiovascular: Heart size is normal. There is no significant pericardial fluid, thickening or pericardial calcification. There is aortic atherosclerosis, as well as atherosclerosis of the great vessels of the mediastinum and the coronary arteries, including calcified atherosclerotic plaque in the left main, left anterior descending, left circumflex and right coronary arteries.  Mediastinum/Nodes: No pathologically enlarged mediastinal or  hilar lymph nodes. Please note that accurate exclusion of hilar adenopathy is limited on noncontrast CT scans. Esophagus is unremarkable in appearance. No axillary lymphadenopathy.  Lungs/Pleura: Small calcified granuloma in the periphery of the left upper lobe is unchanged. No other suspicious appearing pulmonary nodules or masses. No perilymphatic nodularity noted to suggest underlying sarcoidosis. Mild diffuse bronchial wall thickening with very mild centrilobular and paraseptal emphysema. No acute consolidative airspace disease. No pleural effusions.  Upper Abdomen: Aortic atherosclerosis.  Musculoskeletal: There are no aggressive appearing lytic or blastic lesions noted in the visualized portions of the skeleton.  IMPRESSION: 1. No imaging stigmata of sarcoidosis. 2. No acute findings are noted in the thorax.  3. Mild diffuse bronchial wall thickening with very mild centrilobular and paraseptal emphysema; imaging findings suggestive of underlying COPD. 4. Aortic atherosclerosis, in addition to left main and 3 vessel coronary artery disease. Please note that although the presence of coronary artery calcium documents the presence of coronary artery disease, the severity of this disease and any potential stenosis cannot be assessed on this non-gated CT examination. Assessment for potential risk factor modification, dietary therapy or pharmacologic therapy may be warranted, if clinically indicated    SARCOIDOSIS, PULMONARY Please get back to using your Symbicort 2 puffs twice a day.  Rinse and gargle after you use it. Keep albuterol available to use 2 puffs if needed for shortness of breath, chest tightness, wheezing. Get the flu shot in the fall. Follow with Dr Lamonte Sakai in 3 months or sooner if you have any problems.  Obstructive sleep apnea Try restarting your CPAP every night.  Once you are using it reliably please call us and let us know.  At that time we will check an  overnight oximetry on just the CPAP to see if your oxygen levels are stable.  If so then we will be able to get rid of your supplemental oxygen.  If not then we will try to add the oxygen back into the CPAP. Congratulations on your weight loss.  Keep up the good work.   Baltazar Apo, MD, PhD 11/25/2018, 11:17 AM [facility] [phone] or if no answer [phone]

## 2018-11-25 NOTE — Assessment & Plan Note (Signed)
Please get back to using your Symbicort 2 puffs twice a day.  Rinse and gargle after you use it. Keep albuterol available to use 2 puffs if needed for shortness of breath, chest tightness, wheezing. Get the flu shot in the fall. Follow with Dr Lamonte Sakai in 3 months or sooner if you have any problems.

## 2018-11-25 NOTE — Assessment & Plan Note (Signed)
Try restarting your CPAP every night.  Once you are using it reliably please call us and let us know.  At that time we will check an overnight oximetry on just the CPAP to see if your oxygen levels are stable.  If so then we will be able to get rid of your supplemental oxygen.  If not then we will try to add the oxygen back into the CPAP. Congratulations on your weight loss.  Keep up the good work.

## 2018-11-25 NOTE — Patient Instructions (Addendum)
Please get back to using your Symbicort 2 puffs twice a day.  Rinse and gargle after you use it. Keep albuterol available to use 2 puffs if needed for shortness of breath, chest tightness, wheezing. Try restarting your CPAP every night.  Once you are using it reliably please call us and let us know.  At that time we will check an overnight oximetry on just the CPAP to see if your oxygen levels are stable.  If so then we will be able to get rid of your supplemental oxygen.  If not then we will try to add the oxygen back into the CPAP. Congratulations on your weight loss.  Keep up the good work. Continue your Pepcid [facility] as you have been taking it. Get the flu shot in the fall. Follow with Dr Lamonte Sakai in 3 months or sooner if you have any problems.

## 2018-12-04 ENCOUNTER — Telehealth: Payer: Self-pay

## 2018-12-04 DIAGNOSIS — E11649 Type 2 diabetes mellitus with hypoglycemia without coma: Secondary | ICD-10-CM

## 2018-12-04 MED ORDER — INSULIN LISPRO (1 UNIT DIAL) 100 UNIT/ML (KWIKPEN): PEN_INJECTOR | SUBCUTANEOUS | 3 refills | Status: DC

## 2018-12-04 NOTE — Telephone Encounter (Signed)
Refilled via mail pharm as requested.

## 2018-12-04 NOTE — Telephone Encounter (Signed)
Patient calls nurse line stating she will run out of humalog after today. Patient stated she has misplaced her last pen, however insurance will not fill until 8/27. Patient uses mail order and will get more pens then. In the mean time patient is a requesting a vail of humalog to be sent to her local pharmacy. Please advise.

## 2018-12-05 MED ORDER — INSULIN LISPRO (1 UNIT DIAL) 100 UNIT/ML (KWIKPEN): PEN_INJECTOR | SUBCUTANEOUS | 3 refills | Status: DC

## 2018-12-05 NOTE — Addendum Note (Signed)
Addended by: Dorna Bloom on: 12/05/2018 04:42 PM   Modules accepted: Orders

## 2018-12-05 NOTE — Telephone Encounter (Signed)
Patient calls nurse line stating she needs Humalog to go to her [place]. Will resend.

## 2018-12-11 ENCOUNTER — Encounter: Payer: Self-pay | Admitting: Family Medicine

## 2018-12-11 ENCOUNTER — Ambulatory Visit (INDEPENDENT_AMBULATORY_CARE_PROVIDER_SITE_OTHER): Payer: Medicare Other | Admitting: Family Medicine

## 2018-12-11 ENCOUNTER — Other Ambulatory Visit: Payer: Self-pay

## 2018-12-11 VITALS — BP 138/78 | HR 71 | Wt 266.0 lb

## 2018-12-11 DIAGNOSIS — N183 Chronic kidney disease, stage 3 unspecified: Secondary | ICD-10-CM

## 2018-12-11 DIAGNOSIS — E1140 Type 2 diabetes mellitus with diabetic neuropathy, unspecified: Secondary | ICD-10-CM

## 2018-12-11 DIAGNOSIS — I10 Essential (primary) hypertension: Secondary | ICD-10-CM

## 2018-12-11 DIAGNOSIS — E1165 Type 2 diabetes mellitus with hyperglycemia: Secondary | ICD-10-CM

## 2018-12-11 DIAGNOSIS — R1310 Dysphagia, unspecified: Secondary | ICD-10-CM | POA: Insufficient documentation

## 2018-12-11 DIAGNOSIS — R1319 Other dysphagia: Secondary | ICD-10-CM

## 2018-12-11 DIAGNOSIS — Z794 Long term (current) use of insulin: Secondary | ICD-10-CM

## 2018-12-11 NOTE — Progress Notes (Signed)
Date of Visit: 12/11/2018   CC: Nausea  HPI:  Rebecca Hicks is a 73 year old female with past history of diabetes and 20-pack-year smoking history presenting with 1 month history of nausea and vomiting. She reports nausea with every meal and non-bloody and non-bilious vomiting approximately 1x per week.  Nausea relived with Pepcid AC and some relief with ginger and mint tea.   She also reports feeling of food getting stuck in her throat. Some trouble with swalloing pills but mainly with solid foods. No problem with yogurt and liquids. She is eating less and reports early satiety. She is eating 1 meal per day. Eats mostly salads but no fired foods.   Bowel movement once every 2-3 days. Stools are pellet like and "oily." No straining with BM. Some diffuse, cramping abdominal pain relived with BM. No black/tarry or bloody stools. Some bloating and gassiness.   Reports 1 months history of fatigue. She recently lost her brother and other family members so that has added to her stress. She has been busy managing those affairs and helping with her nieces and nephews. She reports feeling like wanting to sleep more than 12 hours per day. Denies loss of interest in activities, she just has no time.   Reports horsness at night and some weight loss. Denies cough, headaches, changes in vision and changes in skin, hair or nails.   ROS: See HPI.   General: No fevers, headaches or changes in vision. Some changes in weight.  Cardio: No palpitations or chest pain. Respiratory: No SOB or wheezing.  GI: Nausea, vomiting, bloating, dysphagia. BM 1x every 2-3 days, non-bloody.  GU: No dysuria or increased frequency.  MSK: No arteralgia or myalgia.  Pysch: Stressed and tired.   [place]: Past Medical History:  Diagnosis Date  . Abdominal pain 12/05/2011  . Abdominal pain, left upper quadrant 12/03/2012  . Acute on chronic renal failure ([place]) 12/25/2012  . Arthritis   . 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  Ataxia 12/15/2015  . Atypical chest pain 03/28/2016  . Cataract   . CERUMEN IMPACTION, RIGHT 01/31/2010   Qualifier: Diagnosis of  By: Dianah Field MD, Marcello Moores    . Chest tightness 02/23/2014  . Chronic kidney disease (CKD), stage II (mild) 03/05/2013   Looks like baseline creat = 1.2 -1.3.  Watch for overdiuresis.   . COLONIC POLYPS, ADENOMATOUS 05/03/2009   Every five year colonoscopy due to adenomatous polyp found 04/2009    . DDD (degenerative disc disease), cervical 11/24/2016  . Diabetes mellitus   . Diabetic neuropathy, type II diabetes mellitus ([place]) 05/28/2008   Qualifier: Diagnosis of  By: Andria Frames MD, Gwyndolyn Saxon    . Diarrhea 01/21/2015  . DM (diabetes mellitus), type 2, uncontrolled ([place]) 02/07/2007   Qualifier: Diagnosis of  By: Wynetta Emery RN, Doroteo Bradford    . 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 Essential hypertension 02/07/2007   Qualifier: Diagnosis of  By: Wynetta Emery RN, Doroteo Bradford    . Excessive sleepiness 04/07/2014  . Fatigue 05/04/2011  . Glaucoma   . GLAUCOMA NOS 02/25/2009   Qualifier: Diagnosis of  By: Andria Frames MD, Gwyndolyn Saxon    . Great toe pain 09/18/2012  . History of nuclear stress test    Myoview 11/17: EF 64, Normal pharmacologic nuclear stress test with no evidence of prior infarct or ischemia.  Marland Kitchen Hoarseness 10/31/2016  . Hyperlipidemia   . Hypertension   . LOW BACK PAIN SYNDROME 05/28/2008   Qualifier: Diagnosis of  By: Andria Frames MD, Gwyndolyn Saxon    .  Morbid obesity ([place])   . Neck pain 11/24/2016  . Neuromuscular disorder ([place])   . Obesity hypoventilation syndrome ([place]) 11/10/2015  . Obstructive sleep apnea 11/17/2015  . Otalgia of right ear 11/24/2016  . Polyneuropathy in diabetes(357.2)   . Sarcoidosis   . Sinusitis acute 10/03/2010  . Sleep apnea    uses cpap  . 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 - She was fitted with a Small size [employer] Full Face Mask Simplus mask  and heated humidification.   . Snoring 09/05/2015  . TB SKIN TEST, POSITIVE 02/07/2007   Annotation: with clear CXR mid 1990's Qualifier: History of  By: Milana Obey, Doroteo Bradford    . TOBACCO USE, QUIT 02/07/2007   Qualifier: Diagnosis of  By: Andria Frames MD, Gwyndolyn Saxon    . Tremor of both hands 12/15/2015  . U R I 03/15/2010   Qualifier: Diagnosis of  By: Andria Frames MD, Gwyndolyn Saxon     Current Outpatient Medications on File Prior to Visit  Medication Sig Dispense Refill  . acetaminophen (TYLENOL) 650 MG CR tablet Take 650 mg by mouth.    Marland Kitchen albuterol (PROAIR HFA) 108 (90 Base) MCG/ACT inhaler Inhale 2 puffs into the lungs every 6 (six) hours as needed for wheezing or shortness of breath. 1 Inhaler 2  . amLODipine (NORVASC) 10 MG tablet TAKE 1 TABLET BY MOUTH  DAILY 90 tablet 3  . aspirin 81 MG tablet Take 81 mg by mouth daily.      . benzonatate (TESSALON PERLES) 100 MG capsule Take 1 capsule (100 mg total) by mouth 3 (three) times daily as needed for cough. 20 capsule 0  . Blood Glucose Monitoring Suppl (ONE TOUCH ULTRA 2) w/Device KIT Test blood sugar three times per day. 1 each 0  . Blood Pressure Monitoring (BLOOD PRESSURE KIT) DEVI 1 Act by Does not apply route daily. 1 Device 0  . budesonide-formoterol (SYMBICORT) 160-4.5 MCG/ACT inhaler INHALE 2 PUFFS INTO THE&nbsp;&nbsp;LUNGS TWO TIMES DAILY 30.6 g 2  . cloNIDine (CATAPRES) 0.1 MG tablet Take 0.1 mg by mouth 2 (two) times daily.    . diphenoxylate-atropine (LOMOTIL) 2.5-0.025 MG tablet Take 1 tablet by mouth 4 (four) times daily as needed for diarrhea or loose stools. 120 tablet 6  . DULoxetine (CYMBALTA) 60 MG capsule Take 1 capsule (60 mg total) by mouth daily. 90 capsule 3  . empagliflozin (JARDIANCE) 25 MG TABS tablet Take 25 mg by mouth daily. 90 tablet 3  . Evolocumab (REPATHA SURECLICK) 062 MG/ML SOAJ Inject 1 pen into the skin every 14 (fourteen) days. 2 pen 11  . furosemide (LASIX) 40 MG tablet Take 40 mg by mouth as needed.    .  hydrochlorothiazide (HYDRODIURIL) 12.5 MG tablet Take 1 tablet (12.5 mg total) by mouth daily. 90 tablet 3  . ibuprofen (ADVIL,MOTRIN) 200 MG tablet Take 200 mg by mouth every 8 (eight) hours as needed.    . insulin lispro (HUMALOG KWIKPEN) 100 UNIT/ML KwikPen INJECT 20 UNITS  SUBCUTANEOUSLY TWO TIMES  DAILY BEFORE MEALS 45 mL 3  . Insulin Pen Needle 31G X 8 MM MISC Use to inject insulin and Victoza 4 times daily 300 each 2  . losartan (COZAAR) 100 MG tablet Take 1 tablet (100 mg total) by mouth daily. 90 tablet 3  . metFORMIN (GLUCOPHAGE-XR) 750 MG 24 hr tablet Take 2 tablets (1,500 mg total) by mouth daily with  breakfast. 180 tablet 3  . metoprolol succinate (TOPROL-XL) 50 MG 24 hr tablet Take 1 tablet (50 mg total) by mouth every evening. Take with or immediately following a meal. 90 tablet 3  . nitroGLYCERIN (NITROSTAT) 0.4 MG SL tablet Place 0.4 mg under the tongue every 5 (five) minutes as needed for chest pain.    . ONE TOUCH ULTRA TEST test strip USE TO TEST BLOOD SUGAR 3  TIMES DAILY 300 each 3  . pregabalin (LYRICA) 75 MG capsule Take 75 mg by mouth 2 (two) times daily.    . rosuvastatin (CRESTOR) 20 MG tablet Take 1 tablet (20 mg total) by mouth daily. 30 tablet 11  . spironolactone (ALDACTONE) 25 MG tablet TAKE 1 TABLET BY MOUTH  DAILY 90 tablet 3  . timolol (TIMOPTIC) 0.5 % ophthalmic solution Place 1 drop into both eyes 2 times daily.    Marland Kitchen tolterodine (DETROL) 1 MG tablet Take 1 mg by mouth daily.    Tyler Aas FLEXTOUCH 100 UNIT/ML SOPN FlexTouch Pen Use once daily    . VICTOZA 18 MG/3ML SOPN INJECT SUBCUTANEOUSLY 1.8MG DAILY 27 mL 3   No current facility-administered medications on file prior to visit.    Social Hx: Mrs. Contino lost some family members recently that has had an impact on her mental health. Dealing with the affairs has kept her busy and with out any extra time to care for her self. She reports feeling tired all the time.   PHYSICAL EXAM: BP 138/78   Pulse 71    Wt 266 lb (120.7 kg)   SpO2 98%   BMI 42.93 kg/m  Gen: Well- appearing, pleasant 73 year old female  HEENT: Normocephalic head. MMM. PERRL.  Heart: S1S2 RRR no murmurs or gallops. Good capillary refill.  Lungs: CTAB with no  Abd: Soft, non-distended abdomen with normoactive bowel sounds.  Ext: Trace LEE.  ASSESSMENT/PLAN:   Rebecca Hicks, a 73 year old diabetic with 20 py smoking history, presenting with a 1 month hisotry of n/v, dysphagia, weight loss, fatigue, and constipation. Normal vitals and PE. Up-to-date on colonoscopy.   Dysphagia Large differential.  Likely esophageal dysmotility and perhaps Shotsky's ring.  Will check labs and get GI referral  FOLLOW UP: Follow up in 3 months for HgA1C.   Vernice Jefferson MS3, Medical Student

## 2018-12-11 NOTE — Assessment & Plan Note (Signed)
Large differential.  Likely esophageal dysmotility and perhaps Shotsky's ring.  Will check labs and get GI referral

## 2018-12-11 NOTE — Progress Notes (Signed)
a1 

## 2018-12-11 NOTE — Patient Instructions (Signed)
I will call tomorrow with blood test results.   Someone should call with to set up the GI appointment.

## 2018-12-12 ENCOUNTER — Encounter: Payer: Self-pay | Admitting: Family Medicine

## 2018-12-12 LAB — CBC
Hematocrit: 41.3 % (ref 34.0–46.6)
Hemoglobin: 13.5 g/dL (ref 11.1–15.9)
MCH: 28.7 pg (ref 26.6–33.0)
MCHC: 32.7 g/dL (ref 31.5–35.7)
MCV: 88 fL (ref 79–97)
Platelets: 169 10*3/uL (ref 150–450)
RBC: 4.7 x10E6/uL (ref 3.77–5.28)
RDW: 14.5 % (ref 11.7–15.4)
WBC: 6.1 10*3/uL (ref 3.4–10.8)

## 2018-12-12 LAB — CMP14+EGFR
ALT: 8 IU/L (ref 0–32)
AST: 12 IU/L (ref 0–40)
Albumin/Globulin Ratio: 1.2 (ref 1.2–2.2)
Albumin: 3.8 g/dL (ref 3.7–4.7)
Alkaline Phosphatase: 89 IU/L (ref 39–117)
BUN/Creatinine Ratio: 25 (ref 12–28)
BUN: 39 mg/dL — ABNORMAL HIGH (ref 8–27)
Bilirubin Total: 0.3 mg/dL (ref 0.0–1.2)
CO2: 23 mmol/L (ref 20–29)
Calcium: 9.9 mg/dL (ref 8.7–10.3)
Chloride: 99 mmol/L (ref 96–106)
Creatinine, Ser: 1.58 mg/dL — ABNORMAL HIGH (ref 0.57–1.00)
GFR calc Af Amer: 37 mL/min/{1.73_m2} — ABNORMAL LOW (ref >59)
GFR calc non Af Amer: 32 mL/min/{1.73_m2} — ABNORMAL LOW (ref >59)
Globulin, Total: 3.2 g/dL (ref 1.5–4.5)
Glucose: 178 mg/dL — ABNORMAL HIGH (ref 65–99)
Potassium: 5.3 mmol/L — ABNORMAL HIGH (ref 3.5–5.2)
Sodium: 137 mmol/L (ref 134–144)
Total Protein: 7 g/dL (ref 6.0–8.5)

## 2018-12-12 NOTE — Assessment & Plan Note (Signed)
BS seem better.  Although I am concerned about wt loss, it is good for her DM

## 2018-12-12 NOTE — Assessment & Plan Note (Signed)
Has lowish BP and some lightheadedness.  Decrease HCTZ dose.  May need to stop altogether.

## 2018-12-12 NOTE — Assessment & Plan Note (Signed)
CAlled to inform creat up a little.  Encouraged fluids.  Also with K+ slightly up, will recheck BMP in two weeks.

## 2018-12-22 ENCOUNTER — Telehealth: Payer: Self-pay | Admitting: Cardiovascular Disease

## 2018-12-22 NOTE — Telephone Encounter (Signed)
Niki from pt's pharmacy that deliver [place] calling stating that pt received damaged repatha and that can replace it with a verbal order. Pharmacy's PH# [phone]. Please address

## 2018-12-22 NOTE — Telephone Encounter (Signed)
Returned call to pharmacy and provided verbal order for replacement Repatha pen.

## 2018-12-24 ENCOUNTER — Encounter: Payer: Self-pay | Admitting: Nurse Practitioner

## 2018-12-24 ENCOUNTER — Ambulatory Visit (INDEPENDENT_AMBULATORY_CARE_PROVIDER_SITE_OTHER): Payer: Medicare Other | Admitting: Nurse Practitioner

## 2018-12-24 ENCOUNTER — Other Ambulatory Visit (INDEPENDENT_AMBULATORY_CARE_PROVIDER_SITE_OTHER): Payer: Medicare Other

## 2018-12-24 ENCOUNTER — Other Ambulatory Visit: Payer: Self-pay

## 2018-12-24 VITALS — BP 138/82 | HR 76 | Temp 98.0°F | Ht 65.0 in | Wt 267.6 lb

## 2018-12-24 DIAGNOSIS — R112 Nausea with vomiting, unspecified: Secondary | ICD-10-CM | POA: Diagnosis not present

## 2018-12-24 DIAGNOSIS — R634 Abnormal weight loss: Secondary | ICD-10-CM | POA: Diagnosis not present

## 2018-12-24 DIAGNOSIS — K219 Gastro-esophageal reflux disease without esophagitis: Secondary | ICD-10-CM

## 2018-12-24 DIAGNOSIS — E875 Hyperkalemia: Secondary | ICD-10-CM | POA: Diagnosis not present

## 2018-12-24 LAB — BASIC METABOLIC PANEL
BUN: 23 mg/dL (ref 6–23)
CO2: 26 mEq/L (ref 19–32)
Calcium: 9.5 mg/dL (ref 8.4–10.5)
Chloride: 102 mEq/L (ref 96–112)
Creatinine, Ser: 1.27 mg/dL — ABNORMAL HIGH (ref 0.40–1.20)
GFR: 50.03 mL/min — ABNORMAL LOW (ref >60.00)
Glucose, Bld: 201 mg/dL — ABNORMAL HIGH (ref 70–99)
Potassium: 5 mEq/L (ref 3.5–5.1)
Sodium: 136 mEq/L (ref 135–145)

## 2018-12-24 MED ORDER — OMEPRAZOLE 40 MG PO CPDR: 40.0000 mg | DELAYED_RELEASE_CAPSULE | ORAL | 5 refills | Status: DC

## 2018-12-24 NOTE — Progress Notes (Signed)
Chief Complaint:  Nausea / vomiting    IMPRESSION and PLAN:    1. 73 yo female with multiple medical problems here with progressive nausea (some vomiting) over last several months. Symptoms could be secondary to gastroparesis from uncontrolled diabetes.Of note she takes Victoza which has also been associated with gastroparesis.  However, she has been on Victoza for over a year and despite better control of blood sugars over last few weeks her symptoms haven't improved. Additionally, review of weights in Epic show an 18 pound weight loss since January which she says is unintentional -for further evaluation of weight loss, nausea / vomiting will proceed with EGD. The risks and benefits of EGD were discussed and the patient agrees to proceed. If negative consider holding Victoza for a while and see if symptoms improve.  2. Dysphagia. Description is vague. Sometimes food feel like it gets 'stuck" in esophagus, almost like she didn't chew well enough. She has some difficulty separating this out from the nausea and vomiting. Further evaluation at time of EGD  3. Hyperkalemia. K+ 5.3 on 8/20.  -repeat bmet today     4. ? GERD. Chronic hoarseness. Has been ENT before. No heartburn or regurgitation. 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 She is on multiple medications. Patient is followed by Dr. Ardis Hughs for hx of chronic diarrhea and colon polyps and had her last surveillance colonoscopy in Jan 2020. Due for recall colonoscopy Jan 2023.   Rebecca Hicks comes in today with complaints of nausea, vomiting and ? Dysphagia. She can't clearly separate dysphagia from the nausea / vomiting.  Main issue is near constant nausea. This has been going on for a few months she thinks but most noticeable over the last month. Emesis often consists of undigested food someone just after a meals,  other times later.   She is nauseated today and has had only an apple to eat  Data Reviewed:  12/11/18 CBC  nl K+ 5.3, Cr 1.58 , liver tests nl   Review of systems:     No chest pain, no SOB, no fevers, no urinary sx   Past Medical History:  Diagnosis Date  . Abdominal pain 12/05/2011  . Abdominal pain, left upper quadrant 12/03/2012  . Acute on chronic renal failure ([place]) 12/25/2012  . Arthritis   . 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 Ataxia 12/15/2015  . Atypical chest pain 03/28/2016  . Cataract   . CERUMEN IMPACTION, RIGHT 01/31/2010   Qualifier: Diagnosis of  By: Dianah Field MD, Marcello Moores    . Chest tightness 02/23/2014  . Chronic kidney disease (CKD), stage II (mild) 03/05/2013   Looks like baseline creat = 1.2 -1.3.  Watch for overdiuresis.   . COLONIC POLYPS, ADENOMATOUS 05/03/2009   Every five year colonoscopy due to adenomatous polyp found 04/2009    . DDD (degenerative disc disease), cervical 11/24/2016  . Diabetes mellitus   . Diabetic neuropathy, type II diabetes mellitus ([place]) 05/28/2008   Qualifier: Diagnosis of  By: Andria Frames MD, Gwyndolyn Saxon    . Diarrhea 01/21/2015  . DM (diabetes mellitus), type 2, uncontrolled ([place]) 02/07/2007   Qualifier: Diagnosis of  By: Wynetta Emery RN, Doroteo Bradford    . 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 Essential hypertension 02/07/2007   Qualifier: Diagnosis of  By: Wynetta Emery RN, Doroteo Bradford    . Excessive sleepiness 04/07/2014  .  Fatigue 05/04/2011  . Glaucoma   . GLAUCOMA NOS 02/25/2009   Qualifier: Diagnosis of  By: Andria Frames MD, Gwyndolyn Saxon    . Great toe pain 09/18/2012  . History of nuclear stress test    Myoview 11/17: EF 64, Normal pharmacologic nuclear stress test with no evidence of prior infarct or ischemia.  Marland Kitchen Hoarseness 10/31/2016  . Hyperlipidemia   . Hypertension   . LOW BACK PAIN SYNDROME 05/28/2008   Qualifier: Diagnosis of  By: Andria Frames MD, Gwyndolyn Saxon    . Morbid obesity ([place])   . Neck pain 11/24/2016  . Neuromuscular disorder ([place])   . Obesity hypoventilation  syndrome ([place]) 11/10/2015  . Obstructive sleep apnea 11/17/2015  . Otalgia of right ear 11/24/2016  . Polyneuropathy in diabetes(357.2)   . Sarcoidosis   . Sinusitis acute 10/03/2010  . Sleep apnea    uses cpap  . 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 - She was fitted with a Small size [employer] Full Face Mask Simplus mask and heated humidification.   . Snoring 09/05/2015  . TB SKIN TEST, POSITIVE 02/07/2007   Annotation: with clear CXR mid 1990's Qualifier: History of  By: Milana Obey, Doroteo Bradford    . TOBACCO USE, QUIT 02/07/2007   Qualifier: Diagnosis of  By: Andria Frames MD, Gwyndolyn Saxon    . Tremor of both hands 12/15/2015  . U R I 03/15/2010   Qualifier: Diagnosis of  By: Andria Frames MD, Gwyndolyn Saxon      Patient's surgical history, family medical history, social history, medications and allergies were all reviewed in Epic   Serum creatinine: 1.58 mg/dL (H) 12/11/18 1232 Estimated creatinine clearance: 42.1 mL/min (A)  Current Outpatient Medications  Medication Sig Dispense Refill  . acetaminophen (TYLENOL) 650 MG CR tablet Take 650 mg by mouth.    Marland Kitchen albuterol (PROAIR HFA) 108 (90 Base) MCG/ACT inhaler Inhale 2 puffs into the lungs every 6 (six) hours as needed for wheezing or shortness of breath. 1 Inhaler 2  . amLODipine (NORVASC) 10 MG tablet TAKE 1 TABLET BY MOUTH  DAILY 90 tablet 3  . aspirin 81 MG tablet Take 81 mg by mouth daily.      . Blood Glucose Monitoring Suppl (ONE TOUCH ULTRA 2) w/Device KIT Test blood sugar three times per day. 1 each 0  . Blood Pressure Monitoring (BLOOD PRESSURE KIT) DEVI 1 Act by Does not apply route daily. 1 Device 0  . budesonide-formoterol (SYMBICORT) 160-4.5 MCG/ACT inhaler INHALE 2 PUFFS INTO THE&nbsp;&nbsp;LUNGS TWO TIMES DAILY 30.6 g 2  . cloNIDine (CATAPRES) 0.1 MG tablet Take 0.1 mg by mouth 2 (two) times daily.    .  diphenoxylate-atropine (LOMOTIL) 2.5-0.025 MG tablet Take 1 tablet by mouth 4 (four) times daily as needed for diarrhea or loose stools. 120 tablet 6  . DULoxetine (CYMBALTA) 60 MG capsule Take 1 capsule (60 mg total) by mouth daily. 90 capsule 3  . empagliflozin (JARDIANCE) 25 MG TABS tablet Take 25 mg by mouth daily. 90 tablet 3  . furosemide (LASIX) 40 MG tablet Take 40 mg by mouth as needed.    . hydrochlorothiazide (HYDRODIURIL) 12.5 MG tablet Take 1 tablet (12.5 mg total) by mouth daily. 90 tablet 3  . ibuprofen (ADVIL,MOTRIN) 200 MG tablet Take 200 mg by mouth every 8 (eight) hours as needed.    . insulin lispro (HUMALOG KWIKPEN) 100 UNIT/ML KwikPen INJECT 20 UNITS  SUBCUTANEOUSLY  TWO TIMES  DAILY BEFORE MEALS 45 mL 3  . Insulin Pen Needle 31G X 8 MM MISC Use to inject insulin and Victoza 4 times daily 300 each 2  . losartan (COZAAR) 100 MG tablet Take 1 tablet (100 mg total) by mouth daily. 90 tablet 3  . metFORMIN (GLUCOPHAGE-XR) 750 MG 24 hr tablet Take 2 tablets (1,500 mg total) by mouth daily with breakfast. 180 tablet 3  . metoprolol succinate (TOPROL-XL) 50 MG 24 hr tablet Take 1 tablet (50 mg total) by mouth every evening. Take with or immediately following a meal. 90 tablet 3  . nitroGLYCERIN (NITROSTAT) 0.4 MG SL tablet Place 0.4 mg under the tongue every 5 (five) minutes as needed for chest pain.    . ONE TOUCH ULTRA TEST test strip USE TO TEST BLOOD SUGAR 3  TIMES DAILY 300 each 3  . pregabalin (LYRICA) 75 MG capsule Take 75 mg by mouth daily.     . rosuvastatin (CRESTOR) 20 MG tablet Take 1 tablet (20 mg total) by mouth daily. 30 tablet 11  . spironolactone (ALDACTONE) 25 MG tablet TAKE 1 TABLET BY MOUTH  DAILY 90 tablet 3  . timolol (TIMOPTIC) 0.5 % ophthalmic solution Place 1 drop into both eyes 2 times daily.    Marland Kitchen tolterodine (DETROL) 1 MG tablet Take 1 mg by mouth daily.    . TRESIBA FLEXTOUCH 100 UNIT/ML SOPN FlexTouch Pen Inject 40 Units into the skin daily. Use once daily     . VICTOZA 18 MG/3ML SOPN INJECT SUBCUTANEOUSLY 1.8MG DAILY 27 mL 3  . Evolocumab (REPATHA SURECLICK) 316 MG/ML SOAJ Inject 1 pen into the skin every 14 (fourteen) days. (Patient not taking: Reported on 12/24/2018) 2 pen 11   No current facility-administered medications for this visit.     Physical Exam:     BP 138/82   Pulse 76   Temp 98 F (36.7 C)   Ht _0  (1.651 m)   Wt 267 lb 9.6 oz (121.4 kg)   BMI 44.53 kg/m   GENERAL:  Pleasant obese female in NAD PSYCH: : Cooperative, normal affect EENT:  conjunctiva pink, mucous membranes moist, neck supple without masses CARDIAC:  RRR,  no peripheral edema PULM: Normal respiratory effort, lungs CTA bilaterally, no wheezing ABDOMEN:  Nondistended, soft, nontender. No obvious masses, no hepatomegaly,  normal bowel sounds SKIN:  turgor, no lesions seen Musculoskeletal:  Normal muscle tone, normal strength NEURO: Alert and oriented x 3, no focal neurologic deficits   Tye Savoy , NP 12/24/2018, 11:48 AM

## 2018-12-24 NOTE — Patient Instructions (Signed)
If you are age 72 or older, your body mass index should be between 23-30. Your Body mass index is 44.53 kg/m. If this is out of the aforementioned range listed, please consider follow up with your Primary Care Provider.  If you are age 22 or younger, your body mass index should be between 19-25. Your Body mass index is 44.53 kg/m. If this is out of the aformentioned range listed, please consider follow up with your Primary Care Provider.   You have been scheduled for an endoscopy. Please follow written instructions given to you at your visit today. If you use inhalers (even only as needed), please bring them with you on the day of your procedure. Your physician has requested that you go to www.startemmi.com and enter the access code given to you at your visit today. This web site gives a general overview about your procedure. However, you should still follow specific instructions given to you by our office regarding your preparation for the procedure.  Your provider has requested that you go to the basement level for lab work before leaving today. Press "B" on the elevator. The lab is located at the first door on the left as you exit the elevator.  We have sent the following medications to your pharmacy for you to pick up at your convenience: Omeprazole  STOP Pepcid.  Thank you for choosing me and [facility].   Tye Savoy, NP

## 2018-12-25 ENCOUNTER — Ambulatory Visit: Payer: Medicare Other | Admitting: Pharmacist

## 2018-12-25 ENCOUNTER — Encounter: Payer: Self-pay | Admitting: Internal Medicine

## 2018-12-25 ENCOUNTER — Encounter: Payer: Self-pay | Admitting: Gastroenterology

## 2018-12-30 ENCOUNTER — Telehealth: Payer: Self-pay

## 2018-12-30 NOTE — Telephone Encounter (Signed)
Covid-19 screening questions   Do you now or have you had a fever in the last 14 days? NO   Do you have any respiratory symptoms of shortness of breath or cough now or in the last 14 days? NO  Do you have any family members or close contacts with diagnosed or suspected Covid-19 in the past 14 days? NO  Have you been tested for Covid-19 and found to be positive? NO        

## 2018-12-31 ENCOUNTER — Encounter: Payer: Self-pay | Admitting: Gastroenterology

## 2018-12-31 ENCOUNTER — Other Ambulatory Visit: Payer: Self-pay

## 2018-12-31 ENCOUNTER — Ambulatory Visit (AMBULATORY_SURGERY_CENTER): Payer: Medicare Other | Admitting: Gastroenterology

## 2018-12-31 VITALS — BP 105/57 | HR 70 | Temp 97.6°F | Resp 12 | Ht 65.0 in | Wt 267.0 lb

## 2018-12-31 DIAGNOSIS — K2950 Unspecified chronic gastritis without bleeding: Secondary | ICD-10-CM | POA: Diagnosis not present

## 2018-12-31 DIAGNOSIS — R112 Nausea with vomiting, unspecified: Secondary | ICD-10-CM

## 2018-12-31 DIAGNOSIS — K2970 Gastritis, unspecified, without bleeding: Secondary | ICD-10-CM

## 2018-12-31 DIAGNOSIS — B9681 Helicobacter pylori [H. pylori] as the cause of diseases classified elsewhere: Secondary | ICD-10-CM

## 2018-12-31 MED ORDER — SODIUM CHLORIDE 0.9 % IV SOLN: 500.0000 mL | Freq: Once | INTRAVENOUS | Status: DC

## 2018-12-31 NOTE — Progress Notes (Signed)
Temp taken by JB VS taken by CW 

## 2018-12-31 NOTE — Patient Instructions (Signed)
YOU HAD AN ENDOSCOPIC PROCEDURE TODAY AT [facility]:   Refer to the procedure report that was given to you for any specific questions about what was found during the examination.  If the procedure report does not answer your questions, please call your gastroenterologist to clarify.  If you requested that your care partner not be given the details of your procedure findings, then the procedure report has been included in a sealed envelope for you to review at your convenience later.  YOU SHOULD EXPECT: Some feelings of bloating in the abdomen. Passage of more gas than usual.  Walking can help get rid of the air that was put into your GI tract during the procedure and reduce the bloating.   Please Note:  You might notice some irritation and congestion in your nose or some drainage.  This is from the oxygen used during your procedure.  There is no need for concern and it should clear up in a day or so.  SYMPTOMS TO REPORT IMMEDIATELY:   Following upper endoscopy (EGD)  Vomiting of blood or coffee ground material  New chest pain or pain under the shoulder blades  Painful or persistently difficult swallowing  New shortness of breath  Fever of 100F or higher  Black, tarry-looking stools  For urgent or emergent issues, a gastroenterologist can be reached at any hour by calling [phone].   DIET:  We do recommend a small meal at first, but then you may proceed to your regular diet.  Drink plenty of fluids but you should avoid alcoholic beverages for 24 hours.  ACTIVITY:  You should plan to take it easy for the rest of today and you should NOT DRIVE or use heavy machinery until tomorrow (because of the sedation medicines used during the test).    FOLLOW UP: Our staff will call the number listed on your records 48-72 hours following your procedure to check on you and address any questions or concerns that you may have regarding the information given to you following your  procedure. If we do not reach you, we will leave a message.  We will attempt to reach you two times.  During this call, we will ask if you have developed any symptoms of COVID 19. If you develop any symptoms (ie: fever, flu-like symptoms, shortness of breath, cough etc.) before then, please call [phone].  If you test positive for Covid 19 in the 2 weeks post procedure, please call and report this information to [place].    If any biopsies were taken you will be contacted by phone or by letter within the next 1-3 weeks.  Please call us at [phone] if you have not heard about the biopsies in 3 weeks.    SIGNATURES/CONFIDENTIALITY: You and/or your care partner have signed paperwork which will be entered into your electronic medical record.  These signatures attest to the fact that that the information above on your After Visit Summary has been reviewed and is understood.  Full responsibility of the confidentiality of this discharge information lies with you and/or your care-partner.  Await pathology  Continue your normal medications  Please read over handout about gastritis

## 2018-12-31 NOTE — Op Note (Signed)
[place] Patient Name: Jazelynn Froning Procedure Date: 12/31/2018 9:15 AM MRN: [id number] Endoscopist: Milus Banister , MD Age: 73 Referring MD:  Date of Birth: May 18, 1945 Gender: Female Account #: [id number] Procedure:                Upper GI endoscopy Indications:              Nausea with vomiting Medicines:                Monitored Anesthesia Care Procedure:                Pre-Anesthesia Assessment:                           - Prior to the procedure, a History and Physical                            was performed, and patient medications and                            allergies were reviewed. The patient's tolerance of                            previous anesthesia was also reviewed. The risks                            and benefits of the procedure and the sedation                            options and risks were discussed with the patient.                            All questions were answered, and informed consent                            was obtained. Prior Anticoagulants: The patient has                            taken no previous anticoagulant or antiplatelet                            agents. ASA Grade Assessment: III - A patient with                            severe systemic disease. After reviewing the risks                            and benefits, the patient was deemed in                            satisfactory condition to undergo the procedure.                           After obtaining informed consent, the endoscope was  passed under direct vision. Throughout the                            procedure, the patient's blood pressure, pulse, and                            oxygen saturations were monitored continuously. The                            Endoscope was introduced through the mouth, and                            advanced to the second part of duodenum. The upper                            GI endoscopy was accomplished  without difficulty.                            The patient tolerated the procedure well. Scope In: Scope Out: Findings:                 Non-nodular, slightly irregular Z line.                           Moderate inflammation characterized by erosions,                            erythema, friability and granularity was found in                            the entire examined stomach. Biopsies were taken                            with a cold forceps for histology.                           The exam was otherwise without abnormality. Complications:            No immediate complications. Estimated blood loss:                            None. Estimated Blood Loss:     Estimated blood loss: none. Impression:               - Irregular, non-nodular Z line.                           - Moderate pan-gastritis, biopsied to check for H.                            pylori.                           - The examination was otherwise normal. Recommendation:           - Patient has a contact number available for  emergencies. The signs and symptoms of potential                            delayed complications were discussed with the                            patient. Return to normal activities tomorrow.                            Written discharge instructions were provided to the                            patient.                           - Resume previous diet.                           - Continue present medications.                           - Await pathology results. Will treat with                            appropriate antibiotics if + for H. pylori.                           - Poorly controlled diabetes (10/2018 Hb A1c was                            9.6) and several medicines probably contribute to                            your nausea. Cymbalta, glucophage, aldactone and                            victoza all list nausea as fairly common side                             effects. Milus Banister, MD 12/31/2018 9:39:28 AM This report has been signed electronically.

## 2018-12-31 NOTE — Progress Notes (Signed)
To PACU, VSS. Report to [email or web address] 

## 2019-01-02 ENCOUNTER — Telehealth: Payer: Self-pay | Admitting: *Deleted

## 2019-01-02 NOTE — Telephone Encounter (Signed)
1. Have you developed a fever since your procedure? no  2.   Have you had an respiratory symptoms (SOB or cough) since your procedure? no  3.   Have you tested positive for COVID 19 since your procedure no  4.   Have you had any family members/close contacts diagnosed with the COVID 19 since your procedure?  no   If yes to any of these questions please route to Joylene John, RN and Alphonsa Gin, Therapist, sports.  Follow up Call-  Call back number 12/31/2018 05/14/2018  Post procedure Call Back phone  # [phone]  Permission to leave phone message Yes Yes  Some recent data might be hidden     Patient questions:  Do you have a fever, pain , or abdominal swelling? No. Pain Score  0 *  Have you tolerated food without any problems? Yes.    Have you been able to return to your normal activities? Yes.    Do you have any questions about your discharge instructions: Diet   No. Medications  No. Follow up visit  No.  Do you have questions or concerns about your Care? No.  Actions: * If pain score is 4 or above: No action needed, pain <4.

## 2019-01-05 ENCOUNTER — Other Ambulatory Visit: Payer: Self-pay

## 2019-01-05 MED ORDER — PYLERA 140-125-125 MG PO CAPS: 3.0000 | ORAL_CAPSULE | Freq: Three times a day (TID) | ORAL | 0 refills | Status: DC

## 2019-01-06 ENCOUNTER — Encounter: Payer: Self-pay | Admitting: Pharmacist

## 2019-01-06 ENCOUNTER — Other Ambulatory Visit: Payer: Self-pay

## 2019-01-06 ENCOUNTER — Other Ambulatory Visit: Payer: Self-pay | Admitting: Family Medicine

## 2019-01-06 ENCOUNTER — Ambulatory Visit (INDEPENDENT_AMBULATORY_CARE_PROVIDER_SITE_OTHER): Payer: Medicare Other | Admitting: Pharmacist

## 2019-01-06 DIAGNOSIS — F4321 Adjustment disorder with depressed mood: Secondary | ICD-10-CM | POA: Diagnosis not present

## 2019-01-06 DIAGNOSIS — E1142 Type 2 diabetes mellitus with diabetic polyneuropathy: Secondary | ICD-10-CM

## 2019-01-06 DIAGNOSIS — E11649 Type 2 diabetes mellitus with hypoglycemia without coma: Secondary | ICD-10-CM | POA: Diagnosis not present

## 2019-01-06 MED ORDER — TRAZODONE HCL 50 MG PO TABS: 50.0000 mg | ORAL_TABLET | Freq: Every evening | ORAL | 3 refills | Status: DC | PRN

## 2019-01-06 MED ORDER — PREGABALIN 75 MG PO CAPS: 75.0000 mg | ORAL_CAPSULE | Freq: Every day | ORAL | 3 refills | Status: DC

## 2019-01-06 MED ORDER — INSULIN PEN NEEDLE 31G X 8 MM MISC: 2 refills | Status: DC

## 2019-01-06 MED ORDER — VICTOZA 18 MG/3ML ~~LOC~~ SOPN: 1.2000 mg | PEN_INJECTOR | Freq: Every day | SUBCUTANEOUS | 3 refills | Status: DC

## 2019-01-06 MED ORDER — METFORMIN HCL ER 500 MG PO TB24: 500.0000 mg | ORAL_TABLET | Freq: Three times a day (TID) | ORAL | 3 refills | Status: DC

## 2019-01-06 MED ORDER — JARDIANCE 25 MG PO TABS: 25.0000 mg | ORAL_TABLET | Freq: Every day | ORAL | 3 refills | Status: DC

## 2019-01-06 NOTE — Progress Notes (Signed)
Reviewed: Agree with Dr. Koval's documentation and management. 

## 2019-01-06 NOTE — Progress Notes (Signed)
S:     Chief Complaint  Patient presents with  . Medication Management    diabetes    Patient arrives in fair spirits, ambulating with the assistance of a cane.  Presents for diabetes evaluation, education, and management. Patient was referred and last seen by Primary Care Provider, Dr. Andria Frames on 12/11/2018.  Family/Social History recently has been very challenging.  She has lost multiple family members and is now the guardian of an 72 year-old nephew.   Insurance coverage/medication affordability: [facility]  Patient reports adherence with medications.  Current diabetes medications include: Tresiba 40 units daily, Humalog 20 units BID, metformin 1500mg  daily, Jardiance 25mg  daily,  Current hypertension medications include: HCTZ 12.5mg  daily, losartan 100mg  daily, amlodipine 10mg  daily, metoprolol 50mg  daily Current hyperlipidemia medications include: Crestor 20mg  and repatha   Patient denies hypoglycemic events. However, she does report sometimes seeing home CBGs below 100. She reports the lowest reading she had recently was 62 in the afternoon but this was explained by the fact that she had not eaten all day. She has not been sleeping well lately due to life stressors. In May her brother was killed.  Her sister passed away in 10/30/2022. She complains of insomnia and increasing frequency of violent dreams.  Patient reported dietary habits: Eats 2 meals/day (lunch and dinner) Breakfast: none Lunch: sometimes fast food, pizza, salads, dinner leftovers Dinner: [employer], baked chicken, potatoes, rice, vegetables (no bread) Snacks: oranges, apples, bananas, peanut butter crackers, pop tarts Drinks: increased water intake, no soda, sweet tea   Pt reports a desire for sweet foods. However, she reports trying to eat more vegetables and other healthy options such as oatmeal.  Patient-reported exercise habits: limited   Patient reports neuropathy. Patient denies visual changes.   O:   Physical Exam Vitals signs reviewed.  Constitutional:      Appearance: Normal appearance. She is obese.  Pulmonary:     Effort: Pulmonary effort is normal.    Review of Systems  Gastrointestinal: Positive for nausea.  Musculoskeletal: Positive for joint pain.  Neurological: Positive for sensory change.  Psychiatric/Behavioral: Positive for depression. The patient has insomnia.        Violent dreams reported     Lab Results  Component Value Date   HGBA1C 9.6 (A) 11/03/2018   There were no vitals filed for this visit.  Lipid Panel     Component Value Date/Time   CHOL 207 (H) 11/03/2018 0947   TRIG 97 11/03/2018 0947   HDL 62 11/03/2018 0947   CHOLHDL 3.3 11/03/2018 0947   CHOLHDL 4.1 01/20/2015 1003   VLDL 10 01/20/2015 1003   LDLCALC 126 (H) 11/03/2018 0947   LDLDIRECT 223 (H) 10/13/2015 0932    Pt reports mostly seeing home CBGs in the 200s.  Clinical ASCVD: Yes  The 10-year ASCVD risk score Mikey Bussing DC Jr., et al., 2013) is: 22.4%   Values used to calculate the score:     Age: 52 years     Sex: Female     Is Non-Hispanic African American: Yes     Diabetic: Yes     Tobacco smoker: No     Systolic Blood Pressure: 123456 mmHg     Is BP treated: Yes     HDL Cholesterol: 62 mg/dL     Total Cholesterol: 207 mg/dL    A/P: Diabetes longstanding currently uncontrolled. Patient is able to verbalize appropriate hypoglycemia management plan. Patient is adherent with medication. Control is suboptimal due to life stressors  and diet. -Continued basal insulin Tresiba (insulin degludec) at 40 units once daily.  -Adjusted dose of  rapid insulin Humalog (insulin lispro) to: 80-150 - take 15units  151-200 - take 20 units > 200 take 25 units PLUS additional 5 units if your next meal has a large amount of carbohydrate (>15 grams) -Decreased dose of GLP-1 Victoza (generic name liraglutide) from 1.8 to 1.2 mg daily due to nausea concerns.   -Continued SGLT2-I Jardiance (generic  nameempagliflozin) at 25mg   Counseled on sick day rules for use. . -Extensively discussed pathophysiology of DM, recommended lifestyle interventions, dietary effects on glycemic control Switched Metformin 750 XR 2 daily to 500mg  XR 3 daily to improve ease of swallowing.  -Counseled on s/sx of and management of hypoglycemia -Next A1C anticipated October 2020.   Mood decreased with recent stressors including 2 deaths in the family.  Insomnia and vivid dreams discussed with PCP and initiated trazodone 50mg . New Rx provided to local pharmacy to facilitate starting today (tonight).   Written patient instructions provided.  Total time in face to face counseling 55 minutes.   Follow up Pharmacist/PCP either in 2 weeks.  Rx clinic Visit in no more than 1 month. Patient seen with Murlean Iba, PharmD candidate, [facility] PGY1 pharmacy resident.

## 2019-01-06 NOTE — Assessment & Plan Note (Signed)
Diabetes longstanding currently uncontrolled. Patient is able to verbalize appropriate hypoglycemia management plan. Patient is adherent with medication. Control is suboptimal due to life stressors and diet. -Continued basal insulin Tresiba (insulin degludec) at 40 units once daily.  -Adjusted dose of  rapid insulin Humalog (insulin lispro) to: 80-150 - take 15units  151-200 - take 20 units > 200 take 25 units PLUS additional 5 units if your next meal has a large amount of carbohydrate (>15 grams) -Decreased dose of GLP-1 Victoza (generic name liraglutide) from 1.8 to 1.2 mg daily due to nausea concerns.   -Continued SGLT2-I Jardiance (generic nameempagliflozin) at 25mg   Counseled on sick day rules for use. . -Extensively discussed pathophysiology of DM, recommended lifestyle interventions, dietary effects on glycemic control Switched Metformin 750 XR 2 daily to 500mg  XR 3 daily to improve ease of swallowing.  -Counseled on s/sx of and management of hypoglycemia.   Request for new refill of pregabalin - deferred to PCP, Dr. Andria Frames.

## 2019-01-06 NOTE — Patient Instructions (Addendum)
It was nice meeting with you today.  We sent a refill to the pharmacy for your Jardiance. Continue taking this once daily.  We have switched your metformin from the 750mg  tablets to the 500mg  tablets so that you can swallow them easier.   Start Trazodone 50mg  daily at night.   Continue to take Tresiba 40 units once daily  Decrease victoza from 1.8 to 1.2mg  daily.   Increase your Humalog to 80-150 - take 15units  151-200 - take 20 units > 200 take 25 units PLUS additional 5 units if your next meal has a large amount of carbohydrate (>15 grams)

## 2019-02-06 ENCOUNTER — Telehealth: Payer: Self-pay | Admitting: Pharmacist

## 2019-02-06 NOTE — Telephone Encounter (Signed)
Called patient to discuss Metformin XR notice sent from "[facility]" to Dr. Andria Frames, PCP.   Informed patient that she may have received a recalled lot number of Metformin XR per the company letter.  She was unaware that her medication may have been recalled.    I instructed her to call [facility] directly and request a new supply of metformin XR.  She plans to contact [facility] and inquire about her supply.   She shared that she was doing "better" on blood sugar control. I plan to see her next week in Rx clinic.

## 2019-02-06 NOTE — Telephone Encounter (Signed)
Noted and agree. 

## 2019-02-10 ENCOUNTER — Ambulatory Visit: Payer: Medicare Other | Admitting: Pharmacist

## 2019-02-19 ENCOUNTER — Other Ambulatory Visit: Payer: Self-pay

## 2019-02-19 ENCOUNTER — Encounter: Payer: Self-pay | Admitting: Family Medicine

## 2019-02-19 ENCOUNTER — Ambulatory Visit: Payer: Medicare Other | Admitting: Family Medicine

## 2019-02-19 VITALS — BP 150/78 | HR 70 | Ht 65.0 in | Wt 267.5 lb

## 2019-02-19 DIAGNOSIS — R1311 Dysphagia, oral phase: Secondary | ICD-10-CM | POA: Diagnosis not present

## 2019-02-19 DIAGNOSIS — I10 Essential (primary) hypertension: Secondary | ICD-10-CM

## 2019-02-19 DIAGNOSIS — Z23 Encounter for immunization: Secondary | ICD-10-CM | POA: Diagnosis not present

## 2019-02-19 DIAGNOSIS — E1165 Type 2 diabetes mellitus with hyperglycemia: Secondary | ICD-10-CM | POA: Diagnosis not present

## 2019-02-19 LAB — POCT GLYCOSYLATED HEMOGLOBIN (HGB A1C): HbA1c, POC (controlled diabetic range): 10.3 % — AB (ref 0.0–7.0)

## 2019-02-19 NOTE — Progress Notes (Signed)
Subjective  CC: Follow-up  Rebecca Hicks is a 73 y.o. female who presents today with the following problems:  DYSPHAGIA Patient was experiencing dysphagia with associated nausea, vomiting and abdominal pain during previous encounter on 8/20. She was subsequently treated for gastritis secondary to a H. Pylori infection (completed a course of pylera in September) following an endoscopy. During today's encounter patient reports improvement of her abdominal pain and vomiting. States that she still experiences some mild dysphagia and associated nausea, which she believes is secondary to her dental issues and inability to chew properly.   ORAL SURGERY Patient is scheduled to receive oral surgery on 02/24/19. States that surgery is very important to her as she thinks it will help her eat properly and reduce her nausea. She is concerned about her diabetes (A1c of 10.3 during today's encounter) and how it might impact her upcoming procedure.   HYPERTENSION Patient has been monitoring blood pressures at home twice weekly. States that measurements are much lower at home (130s/80) than clinic measurement (150/78). She has been compliant with most of her medications but occasionally misses her nightly medications (amlodipine, clonidine, and metoprolol). Denies chest pain, dyspnea, lightheadedness, edema.     DIABETES  Patient has a long-standing history of diabetes. She has been monitoring her blood sugars at home (twice daily). States that measurements range from 180s during the day to >200 at night. She reports feeling stressed in recent months due to a number of recent deaths in the family and other family-related concerns. These stressors have driven her to "stress-eat" with sweets and sugary foods. She also typically only eats one large meal at mid-day, skipping breakfast. She was seen by Dr. Valentina Lucks on 9/15 for medication management. She has not taken rapid acting insulin (Humalog) as frequently as  recommended due to skipping meals but has been compliant with all other medications. She has also experienced polyuria in the past several months. Denies dysuria, urinary urgency, vision changes, and hypoglycemic symptoms.   ROS See HPI above   PMH Smoking Status noted  . Objective  Physical Exam:  BP (!) 150/78   Pulse 70   Ht 5\' 5"  (1.651 m)   Wt 267 lb 8 oz (121.3 kg)   SpO2 96%   BMI 44.51 kg/m  Gen: NAD, resting comfortably HEENT: NCAT, PERRL, clear conjunctiva, oropharynx clear, supple neck CV: RRR with no murmurs appreciated Pulm: NWOB, CTAB with no crackles, wheezes, or rhonchi GI: Normal bowel sounds present. Soft, Nontender, Nondistended. MSK: no edema Skin: warm, dry Neuro: grossly normal, moves all extremities Psych: normal affect and thought content  Pertinent Labs:   Office Visit on 02/19/2019  Component Date Value Ref Range Status  . HbA1c, POC (controlled diabetic ra* 02/19/2019 10.3* 0.0 - 7.0 % Final    Assessment & Plan    Dysphagia Patient's dysphagia is improving following treatment for H. Pylori infection. Current mild dysphagia is likely secondary to patient's dental issues. She is scheduled for oral surgery next week (11/03) which may provide additional relief.  - Will monitor patient's dysphagia and associated nausea following surgery   DM (diabetes mellitus), type 2, uncontrolled Poorly controlled. Trending upwards with A1c of 10.3 during today's encounter. Blood sugar measurements at home ranged from 180-200s. Will need to get patient's blood sugars well-controlled before her surgery next week.  - Advised patient to monitor blood sugars 4x daily and to follow sliding scale insulin schedule with examples provided in after visit summary. Told to contact me if she  has any questions around schedule.  - Counseled patient on reducing sugary food intake  - Patient is interested in re-educating herself around diabetes. Will discuss this further at next  visit and provide additional information.  - Will monitor A1c and blood glucose levels at follow-up visit   Essential hypertension Blood pressure of 150/78 during today's encounter. Home blood pressure measurements (130s/80) are at target. No longer experiencing lightheadedness.  - Advised to continue current medication regimen  - Will monitor at future encounters   Health Maintenance discussed with patient. Patient received flu vaccine during's today encounter.   Orders Placed This Encounter  Procedures  . Flu Vaccine QUAD High Dose(Fluad)  . HgB A1c    Gwynneth Albright, Medical Student

## 2019-02-19 NOTE — Assessment & Plan Note (Signed)
Poorly controlled. Trending upwards with A1c of 10.3 during today's encounter. Blood sugar measurements at home ranged from 180-200s. Will need to get patient's blood sugars well-controlled before her surgery next week.  - Advised patient to monitor blood sugars 4x daily and to follow sliding scale insulin schedule with examples provided in after visit summary. Told to contact me if she has any questions around schedule.  - Counseled patient on reducing sugary food intake  - Patient is interested in re-educating herself around diabetes. Will discuss this further at next visit and provide additional information.  - Will monitor A1c and blood glucose levels at follow-up visit

## 2019-02-19 NOTE — Assessment & Plan Note (Addendum)
Patient's dysphagia is improving following treatment for H. Pylori infection. Current mild dysphagia is likely secondary to patient's dental issues. She is scheduled for oral surgery next week (11/03) which may provide additional relief.  - Will monitor patient's dysphagia and associated nausea following surgery

## 2019-02-19 NOTE — Patient Instructions (Signed)
Keep taking your long acting treseba as you have been.  By that I mean be consistent in taking it.  Starting right away and continuing at least ten days after your surgery - maybe more. Check your blood sugar four times per day.  It doesn't have to be exact, but it would be best if it is close to every six hours.  Lets say your regular dose with a regular meal is 15 units of short acting. Add 5 units if it is the big meal. Subtract 10 units if no food.   Subtract 5 units if your blood sugar is between 80 and 120. Do not give any insulin (if you are not eating if your blood sugar is below 80.  Subtract 10 units if you are eating.  Add 3 units for each time your blood sugar is 50 points above 120.  Examples  Breakfast regular mean start dose 15  Blood sugar in 90  Give 10 instead Blood sugar is 150 give 15 Blood sugar is 250 21  Lunch big meal Start dose is 20 because big meal Blood sugar 70  Give 10 units Blood sugar 130 20 units Blood sugar 300 35 units.  Nighttime no food. Starting dose is 5  Blood sugar 120 or below nothing. Blood sugar 200.10 units.  This is complicated.  Call me if questions or if you follow this and your blood sugars are too high or too low.  We can make adjustments together.

## 2019-02-19 NOTE — Assessment & Plan Note (Addendum)
Blood pressure of 150/78 during today's encounter. Home blood pressure measurements (130s/80) are at target. No longer experiencing lightheadedness.  - Advised to continue current medication regimen  - Will monitor at future encounters

## 2019-02-20 ENCOUNTER — Encounter: Payer: Self-pay | Admitting: Family Medicine

## 2019-03-09 ENCOUNTER — Other Ambulatory Visit: Payer: Self-pay | Admitting: Family Medicine

## 2019-03-12 ENCOUNTER — Other Ambulatory Visit: Payer: Self-pay | Admitting: Family Medicine

## 2019-03-18 ENCOUNTER — Ambulatory Visit: Payer: Medicare Other | Admitting: Emergency Medicine

## 2019-04-03 ENCOUNTER — Ambulatory Visit: Payer: Medicare Other | Admitting: Emergency Medicine

## 2019-04-26 ENCOUNTER — Other Ambulatory Visit: Payer: Self-pay | Admitting: Family Medicine

## 2019-06-01 ENCOUNTER — Other Ambulatory Visit: Payer: Self-pay | Admitting: Family Medicine

## 2019-06-01 DIAGNOSIS — I10 Essential (primary) hypertension: Secondary | ICD-10-CM

## 2019-06-01 DIAGNOSIS — M5440 Lumbago with sciatica, unspecified side: Secondary | ICD-10-CM

## 2019-06-01 DIAGNOSIS — G8929 Other chronic pain: Secondary | ICD-10-CM

## 2019-06-13 ENCOUNTER — Ambulatory Visit: Payer: Medicare Other | Attending: Internal Medicine

## 2019-06-13 DIAGNOSIS — Z23 Encounter for immunization: Secondary | ICD-10-CM | POA: Insufficient documentation

## 2019-06-13 NOTE — Progress Notes (Signed)
   Covid-19 Vaccination Clinic  Name:  Rebecca Hicks    MRN: [id number] DOB: 19-Sep-1945  06/13/2019  Rebecca Hicks was observed post Covid-19 immunization for 30 minutes based on pre-vaccination screening without incidence. She was provided with Vaccine Information Sheet and instruction to access the V-Safe system.   Rebecca Hicks was instructed to call 911 with any severe reactions post vaccine: Marland Kitchen Difficulty breathing  . Swelling of your face and throat  . A fast heartbeat  . A bad rash all over your body  . Dizziness and weakness    Immunizations Administered    Name Date Dose VIS Date Route   Pfizer COVID-19 Vaccine 06/13/2019 10:31 AM 0.3 mL 04/03/2019 Intramuscular   Manufacturer: [place]   Lot: [id number]   [place]: [id number]

## 2019-06-24 ENCOUNTER — Telehealth (INDEPENDENT_AMBULATORY_CARE_PROVIDER_SITE_OTHER): Payer: Medicare Other | Admitting: Primary Care

## 2019-06-24 DIAGNOSIS — Z9989 Dependence on other enabling machines and devices: Secondary | ICD-10-CM

## 2019-06-24 DIAGNOSIS — Z7951 Long term (current) use of inhaled steroids: Secondary | ICD-10-CM

## 2019-06-24 DIAGNOSIS — D869 Sarcoidosis, unspecified: Secondary | ICD-10-CM | POA: Diagnosis not present

## 2019-06-24 DIAGNOSIS — R058 Other specified cough: Secondary | ICD-10-CM

## 2019-06-24 DIAGNOSIS — G4733 Obstructive sleep apnea (adult) (pediatric): Secondary | ICD-10-CM

## 2019-06-24 DIAGNOSIS — R05 Cough: Secondary | ICD-10-CM | POA: Diagnosis not present

## 2019-06-24 DIAGNOSIS — Z87891 Personal history of nicotine dependence: Secondary | ICD-10-CM

## 2019-06-24 MED ORDER — AZELASTINE HCL 0.1 % NA SOLN: 2.0000 | Freq: Two times a day (BID) | NASAL | 3 refills | Status: DC

## 2019-06-24 NOTE — Progress Notes (Signed)
  Virtual Visit via Video Note  I connected with Rebecca Hicks on 06/24/19 at  2:30 PM EST by a video enabled telemedicine application and verified that I am speaking with the correct person using two identifiers.  Location: Patient: Home Provider: Office   I discussed the limitations of evaluation and management by telemedicine and the availability of in person appointments. The patient expressed understanding and agreed to proceed.  History of Present Illness: 74 year female, former smoker quit 1997 (20 pack year hx). PMH significant for pulmonary and cutaneous sarcoidosis, obstructive lung disease, OSA/OHS, HTN, CAD, type 2 DM, CKD stage 3, dysphagia, colon polyps, HLD, hysterectomy. Patient of Dr. Lamonte Sakai.   06/24/2019 Patient contacted today for acute video visit. Reports cough and voice hoarseness returned 1 months ago. Cough is non-productive. She has only been taking her Symbicort in the evening. Not currently using CPAP. Goes to bed around midnight and falls asleep at 4am. Wakes up around 10-11am and states that the morning can get away from her so she forgets to take her inhaler. She is compliant with omeprazole. She does drink peppermint tea and lemon tea. Denies trouble breathing, chest tightness or pain.     Observations/Objective:  - Appears well; no shortness of breath or wheezing - Mild voice hoarseness and cough  Assessment and Plan:  Sarcoidosis with obstructive lung disease: - Cough returned 1 month ago. NO shortness of breath or chest pain.  - Resume Symbicort 2 puffs morning and evening   Upper airway cough: - Start Astelin nasal spray for post nasal drip- take 1-2 sprays twice daily - Continue Omeprazole for acid reflux  - Continue Zyrtec 10mg  daily for allergy symptoms  - Avoid mint or menthol products   OSA/OHA: - Encouraged patient to resume CPAP use  - Recommend establishing a set bedtime routine  - Aim to go to bed around 11pm and wake up around  9-10am  Follow Up Instructions:  - 3 months with Dr. Lamonte Sakai (June)   I discussed the assessment and treatment plan with the patient. The patient was provided an opportunity to ask questions and all were answered. The patient agreed with the plan and demonstrated an understanding of the instructions.   The patient was advised to call back or seek an in-person evaluation if the symptoms worsen or if the condition fails to improve as anticipated.  I provided 22 minutes of non-face-to-face time during this encounter.   Martyn Ehrich, NP

## 2019-06-24 NOTE — Patient Instructions (Signed)
Recommendations: Resume Symbicort 2 puffs morning and evening (set reminder) Start Astelin nasal spray for post nasal drip- take 1-2 sprays twice daily Avoid mint or menthol products  Continue Omeprazole for acid reflux  Continue Zyrtec 10mg  daily for allergy symptoms  Resume CPAP use, aim to go to bed around 11pm   Follow-up: - 3 months with Dr. Lamonte Sakai (June)

## 2019-06-29 ENCOUNTER — Other Ambulatory Visit: Payer: Self-pay

## 2019-06-29 ENCOUNTER — Telehealth (INDEPENDENT_AMBULATORY_CARE_PROVIDER_SITE_OTHER): Payer: Medicare Other | Admitting: Family Medicine

## 2019-06-29 ENCOUNTER — Encounter: Payer: Self-pay | Admitting: Family Medicine

## 2019-06-29 DIAGNOSIS — N183 Chronic kidney disease, stage 3 unspecified: Secondary | ICD-10-CM

## 2019-06-29 DIAGNOSIS — F4323 Adjustment disorder with mixed anxiety and depressed mood: Secondary | ICD-10-CM | POA: Insufficient documentation

## 2019-06-29 DIAGNOSIS — E1140 Type 2 diabetes mellitus with diabetic neuropathy, unspecified: Secondary | ICD-10-CM

## 2019-06-29 DIAGNOSIS — Z794 Long term (current) use of insulin: Secondary | ICD-10-CM

## 2019-06-29 NOTE — Progress Notes (Signed)
Patient agrees to phone visit.  At the end of the visit, she indicated that she did have video capability, but by that time it was too late.  Has had first cOVID vaccine.  Gets second next week.  We agreed next visit would be in person.  She has been having considerable trouble since her dental surgery.  Has mostly been on a liquid/soft diet.  Because she has also been depressed (see below), she is eating more and has gained several pounds.  Issues 1. Diabetes.  Compliance with meds is good.  Compliance with diet is poor.  She has been using the sliding scale prescribed last fall.  She has had seveal episodes of low blood sugars (<70) when she has used 25 or more units of humalog.  Average blood sugar reading by her meter is 160.  Indicates A1C has likely improved since Oct reading of 10.  We capped her top sliding scale dose at 20 units.  She will come for lab only check of A1C  2. CKD: Due for recheck of CKD.  Will get BMP with her lab only visit.  3. Depression.  Several losses recently.  Has not previously struggled with depression.  Mentioned multiple times during discussion of dietary compliance.  No SI or HI.  Feels she would benefit from a counselor.  Given Zella Ball [phone]. [phone]  Future orders entered for BMP and A1C.   Duration of call 25 minutes.  FU by me in one month.   Referral for depression.

## 2019-06-29 NOTE — Assessment & Plan Note (Signed)
Actually has gained wt on a liquid diet.  Discussed compliance.

## 2019-06-29 NOTE — Assessment & Plan Note (Signed)
Check BMP .  

## 2019-06-29 NOTE — Assessment & Plan Note (Signed)
Decrease max dose of Humalog to 20 units.

## 2019-06-29 NOTE — Assessment & Plan Note (Signed)
Refer to Dr. Gwenlyn Saran.

## 2019-06-30 ENCOUNTER — Telehealth: Payer: Self-pay | Admitting: Family Medicine

## 2019-06-30 DIAGNOSIS — E7800 Pure hypercholesterolemia, unspecified: Secondary | ICD-10-CM

## 2019-06-30 MED ORDER — EZETIMIBE 10 MG PO TABS: 10.0000 mg | ORAL_TABLET | Freq: Every day | ORAL | 3 refills | Status: DC

## 2019-06-30 NOTE — Assessment & Plan Note (Signed)
Add zetia 

## 2019-06-30 NOTE — Telephone Encounter (Signed)
Called in FU to visit yesterday.  I meant to address cholesterol and did not.  Patient informed of goal LDL <70.  Agrees to add zetia to her crestor.

## 2019-07-07 ENCOUNTER — Other Ambulatory Visit: Payer: Medicare Other

## 2019-07-07 ENCOUNTER — Ambulatory Visit: Payer: Medicare Other | Attending: Internal Medicine

## 2019-07-07 DIAGNOSIS — Z23 Encounter for immunization: Secondary | ICD-10-CM

## 2019-07-07 NOTE — Progress Notes (Signed)
   Covid-19 Vaccination Clinic  Name:  Rebecca Hicks    MRN: [id number] DOB: [birth year]  07/07/2019  [name] was observed post Covid-19 immunization for 15 minutes without incident. She was provided with Vaccine Information Sheet and instruction to access the V-Safe system.   [name] was instructed to call 911 with any severe reactions post vaccine: Marland Kitchen Difficulty breathing  . Swelling of face and throat  . A fast heartbeat  . A bad rash all over body  . Dizziness and weakness   Immunizations Administered    Name Date Dose VIS Date Route   Pfizer COVID-19 Vaccine 07/07/2019 11:19 AM 0.3 mL 04/03/2019 Intramuscular   Manufacturer: [place]   Lot: [id number]   [place]: [id number]

## 2019-07-10 ENCOUNTER — Other Ambulatory Visit: Payer: Medicare Other

## 2019-07-13 ENCOUNTER — Other Ambulatory Visit: Payer: Self-pay | Admitting: Nurse Practitioner

## 2019-07-21 ENCOUNTER — Other Ambulatory Visit: Payer: Self-pay | Admitting: Family Medicine

## 2019-07-21 DIAGNOSIS — E11649 Type 2 diabetes mellitus with hypoglycemia without coma: Secondary | ICD-10-CM

## 2019-08-03 ENCOUNTER — Other Ambulatory Visit: Payer: Self-pay

## 2019-08-03 MED ORDER — ROSUVASTATIN CALCIUM 20 MG PO TABS: 20.0000 mg | ORAL_TABLET | Freq: Every day | ORAL | 1 refills | Status: DC

## 2019-08-11 LAB — HM DIABETES EYE EXAM

## 2019-08-19 ENCOUNTER — Other Ambulatory Visit: Payer: Self-pay

## 2019-08-19 ENCOUNTER — Other Ambulatory Visit: Payer: Medicare Other

## 2019-08-19 DIAGNOSIS — E1140 Type 2 diabetes mellitus with diabetic neuropathy, unspecified: Secondary | ICD-10-CM

## 2019-08-19 DIAGNOSIS — N183 Chronic kidney disease, stage 3 unspecified: Secondary | ICD-10-CM

## 2019-08-20 ENCOUNTER — Telehealth: Payer: Self-pay | Admitting: Family Medicine

## 2019-08-20 ENCOUNTER — Encounter: Payer: Self-pay | Admitting: Family Medicine

## 2019-08-20 LAB — BASIC METABOLIC PANEL
BUN/Creatinine Ratio: 23 (ref 12–28)
BUN: 32 mg/dL — ABNORMAL HIGH (ref 8–27)
CO2: 21 mmol/L (ref 20–29)
Calcium: 9.4 mg/dL (ref 8.7–10.3)
Chloride: 103 mmol/L (ref 96–106)
Creatinine, Ser: 1.38 mg/dL — ABNORMAL HIGH (ref 0.57–1.00)
GFR calc Af Amer: 44 mL/min/{1.73_m2} — ABNORMAL LOW (ref >59)
GFR calc non Af Amer: 38 mL/min/{1.73_m2} — ABNORMAL LOW (ref >59)
Glucose: 183 mg/dL — ABNORMAL HIGH (ref 65–99)
Potassium: 4.5 mmol/L (ref 3.5–5.2)
Sodium: 138 mmol/L (ref 134–144)

## 2019-08-20 NOTE — Telephone Encounter (Signed)
Called and told BMP OK but A1C is up.  We agreed to discuss further when she sees me and Dr. Valentina Lucks next week.   Also, has not yet connected with counselor and still depressed.  Will discuss next visit.

## 2019-08-27 ENCOUNTER — Ambulatory Visit: Payer: Medicare Other | Admitting: Pharmacist

## 2019-08-27 ENCOUNTER — Ambulatory Visit: Payer: Medicare Other | Admitting: Family Medicine

## 2019-08-27 ENCOUNTER — Encounter: Payer: Self-pay | Admitting: Family Medicine

## 2019-08-27 ENCOUNTER — Other Ambulatory Visit: Payer: Self-pay

## 2019-08-27 VITALS — BP 142/78 | HR 69 | Ht 65.0 in | Wt 267.0 lb

## 2019-08-27 DIAGNOSIS — E1165 Type 2 diabetes mellitus with hyperglycemia: Secondary | ICD-10-CM | POA: Diagnosis not present

## 2019-08-27 DIAGNOSIS — Z794 Long term (current) use of insulin: Secondary | ICD-10-CM | POA: Diagnosis not present

## 2019-08-27 DIAGNOSIS — F4323 Adjustment disorder with mixed anxiety and depressed mood: Secondary | ICD-10-CM

## 2019-08-27 DIAGNOSIS — E1140 Type 2 diabetes mellitus with diabetic neuropathy, unspecified: Secondary | ICD-10-CM

## 2019-08-27 DIAGNOSIS — K009 Disorder of tooth development, unspecified: Secondary | ICD-10-CM | POA: Diagnosis not present

## 2019-08-27 LAB — POCT GLYCOSYLATED HEMOGLOBIN (HGB A1C): HbA1c, POC (controlled diabetic range): 9.2 % — AB (ref 0.0–7.0)

## 2019-08-27 MED ORDER — OZEMPIC (0.25 OR 0.5 MG/DOSE) 2 MG/1.5ML ~~LOC~~ SOPN: 0.5000 mg | PEN_INJECTOR | SUBCUTANEOUS | 3 refills | Status: DC

## 2019-08-27 MED ORDER — OZEMPIC (0.25 OR 0.5 MG/DOSE) 2 MG/1.5ML ~~LOC~~ SOPN: 0.2500 mg | PEN_INJECTOR | SUBCUTANEOUS | 0 refills | Status: DC

## 2019-08-27 NOTE — Patient Instructions (Signed)
We are leaving your lyrica and your cymbalta the same. Dr. Valentina Lucks is switching you to ozempic Diabetes is moving in the right direction.   I am sorry about the struggles you are having.  I have great confidence that you will bounce back to your normal.  I hope that Dr. Hartford Poli is a good match for you. I will see you in six weeks to make sure you are headed in the right direction.

## 2019-08-27 NOTE — Patient Instructions (Signed)
It was great to see you again today!   Great job with improving your A1c. We discussed that we would like to see it be under 8 and discussed some dietary changes.   Stop using your Victoza. Start using Ozempic (semaglutide) 0.25 mg once weekly. I will call you to follow up in 10-14 days to see how you are doing.

## 2019-08-27 NOTE — Progress Notes (Signed)
Reviewed: I agree with Dr. Koval's documentation and management. 

## 2019-08-27 NOTE — Progress Notes (Signed)
S:     Chief Complaint  Patient presents with  . Medication Management    Diabetes    Patient arrives by herself in good spirits with a cane.  Presents for diabetes evaluation, education, and management Patient was referred and last seen by Primary Care Provider, Dr. Andria Frames on 06/29/2019.    Patient reports Diabetes was diagnosed in 2008.    Insurance coverage/medication affordability: UHC Medicare  Medication adherence reported Good .   Current diabetes medications include: metformin XR 1000mg  q am, metformin XR 500 mg q pm, Jardiance (empagliflozin) 10 mg daily, Victoza (liraglutide) 1.2 mg daily, Tresiba (insulin degludec) 40 units SQ daily, Humalog (insulin lispro) 10-20 units BID before meals (pt reports avg dose 15 units, using four times daily) Current hypertension medications include: amlodipine 10 mg daily, clonidine 0.1 mg BID, HCTZ 12.5 mg daily, losartan 100 mg daily, metoprolol succinate 50 mg daily, spironolactone 25 mg daily Current hyperlipidemia medications include: rosuvastatin 20 mg daily, ezetimibe 10 mg daily  Patient denies hypoglycemic events.  Patient reported dietary habits: Eats 2-3 meals/day  Patient-reported exercise habits: limited   Patient denies nocturia (nighttime urination).  Patient denies neuropathy (nerve pain). Patient denies visual changes. Patient denies self foot exams.     O:  Physical Exam Vitals reviewed.  Constitutional:      Appearance: She is obese.  Pulmonary:     Effort: Pulmonary effort is normal.  Neurological:     Mental Status: She is alert.  Psychiatric:        Mood and Affect: Mood normal.        Thought Content: Thought content normal.        Judgment: Judgment normal.      Review of Systems  Musculoskeletal: Positive for joint pain.     Lab Results  Component Value Date   HGBA1C 9.2 (A) 08/27/2019   There were no vitals filed for this visit.  Lipid Panel     Component Value Date/Time   CHOL  207 (H) 11/03/2018 0947   TRIG 97 11/03/2018 0947   HDL 62 11/03/2018 0947   CHOLHDL 3.3 11/03/2018 0947   CHOLHDL 4.1 01/20/2015 1003   VLDL 10 01/20/2015 1003   LDLCALC 126 (H) 11/03/2018 0947   LDLDIRECT 223 (H) 10/13/2015 0932    Home fasting and 2 hour post prandial blood sugars: 137-280   Clinical Atherosclerotic Cardiovascular Disease (ASCVD): Coronary Calcification The 10-year ASCVD risk score Mikey Bussing DC Jr., et al., 2013) is: 35.2%   Values used to calculate the score:     Age: 74 years     Sex: Female     Is Non-Hispanic African American: Yes     Diabetic: Yes     Tobacco smoker: No     Systolic Blood Pressure: [id number] mmHg     Is BP treated: Yes     HDL Cholesterol: 62 mg/dL     Total Cholesterol: 207 mg/dL    A/P: Diabetes longstanding and currently uncontrolled.  Medication adherence appears good. Control is suboptimal due to diet and other life stressors. -Continued Tresiba FlexTouch (insulin degludec) 40 units daily.  -Continued  Humalog (insulin lispro) 10-20 units before meals depending on BG.  -Discontinued Victoza (generic name liraglutide).   -Initiated Ozempic (semaglutide) 0.25 mg weekly. Plan to follow-up in 2 weeks.  -Continued Jardiance (empagliflozin) 10 mg daily. Counseled on sick day rules. -Extensively discussed pathophysiology of diabetes, recommended lifestyle interventions, dietary effects on blood sugar control -Counseled on s/sx of  and management of hypoglycemia -Next A1C anticipated August.   ASCVD risk - primary prevention in patient with diabetes. Last LDL is not controlled. ASCVD risk score is >20%  - high intensity statin indicated. Aspirin is not indicated.  -Continued rosuvastatin 20 mg daily.   Hypertension longstanding and currently uncontrolled.  Short-term blood pressure goal = SBP < 140 mmHg. Once achieved, can considering lowering SBP goal <130 mmHg Medication adherence good.  Blood pressure control is suboptimal due to diet, limited  mobility, and pain  Written patient instructions provided.  Total time in face to face counseling 30 minutes.   Follow up Pharmacist phone call  in 2 weeks.   Patient seen with Sherre Poot, PharmD Candidate.

## 2019-08-27 NOTE — Assessment & Plan Note (Signed)
Diabetes longstanding and currently uncontrolled.  Medication adherence appears good. Control is suboptimal due to diet and other life stressors. -Continued Tresiba FlexTouch (insulin degludec) 40 units daily.  -Continued  Humalog (insulin lispro) 10-20 units before meals depending on BG.  -Discontinued Victoza (generic name liraglutide).   -Initiated Ozempic (semaglutide) 0.25 mg weekly. Plan to follow-up in 2 weeks.  -Continued Jardiance (empagliflozin) 10 mg daily. Counseled on sick day rules. -Extensively discussed pathophysiology of diabetes, recommended lifestyle interventions, dietary effects on blood sugar control -Counseled on s/sx of and management of hypoglycemia

## 2019-08-27 NOTE — Progress Notes (Signed)
21 

## 2019-08-28 ENCOUNTER — Encounter: Payer: Self-pay | Admitting: Family Medicine

## 2019-08-28 DIAGNOSIS — K009 Disorder of tooth development, unspecified: Secondary | ICD-10-CM | POA: Insufficient documentation

## 2019-08-28 NOTE — Assessment & Plan Note (Signed)
Changes per Dr. Valentina Lucks for meds.  She feels improvement in mood and dental problems will allow improved diet and exercise.

## 2019-08-28 NOTE — Assessment & Plan Note (Signed)
No change in neuropathy therapy.

## 2019-08-28 NOTE — Assessment & Plan Note (Signed)
She would like to talk to counselor.  I envision short term therapy.  Introduced to Dr. Hartford Poli while patient here.  Does not want any change in medications (cymbalta for both depression and neuropathy.)  Long term prognosis is excellent.

## 2019-08-28 NOTE — Progress Notes (Signed)
    SUBJECTIVE:   CHIEF COMPLAINT / HPI:   FU diabetes and more.  When she made the appointment, she had a rash under her breasts.  Since then, the rash has resolved.  Issues: 1. DM  Improving and still needs work.  A1C is 9.2.  Largely handled today by Dr. Valentina Lucks from a medication standpoint.  See adjustment reaction and dental below for lifestyle factors. 2. Thre month ordeal with dental implant surgery x 2 with complications.  Unable to chew food.  "I have eaten a lot of ice cream. 3. Depression/adjustment reaction.  Events have conspired to make this a very difficult time for Rebecca Hicks even though she has no previous mental health issues.  Events include: 1. Retirement and COVID isolation.  She has lost her sense of purpose - always a driving force in her life. 2. Dental issues as above. 3. Family continues to be dependant on her.  She had hoped they would be independent and, in fact, care for her. 4. Aniversary reactions.  Brother died one year ago 2022-08-07, Mothers Day.  Sister died 89 months ago.   OBJECTIVE:   BP (!) 142/78   Pulse 69   Ht 5\' 5"  (1.651 m)   Wt 267 lb (121.1 kg)   SpO2 95%   BMI 44.43 kg/m   Affect, mood, cognition normal throughout.   Lungs clear. Cardiac RRR without m or g  ASSESSMENT/PLAN:   Adjustment reaction with anxiety and depression She would like to talk to counselor.  I envision short term therapy.  Introduced to Dr. Hartford Poli while patient here.  Does not want any change in medications (cymbalta for both depression and neuropathy.)  Long term prognosis is excellent.  Diabetic neuropathy, type II diabetes mellitus No change in neuropathy therapy.  DM (diabetes mellitus), type 2, uncontrolled Changes per Dr. Valentina Lucks for meds.  She feels improvement in mood and dental problems will allow improved diet and exercise.    Dental anomaly Problems related to her dental surgery have influenced both her mood and diet/diabetes.     Zenia Resides, MD [place]

## 2019-08-28 NOTE — Assessment & Plan Note (Signed)
Problems related to her dental surgery have influenced both her mood and diet/diabetes.

## 2019-08-31 ENCOUNTER — Other Ambulatory Visit: Payer: Self-pay | Admitting: *Deleted

## 2019-08-31 DIAGNOSIS — E7800 Pure hypercholesterolemia, unspecified: Secondary | ICD-10-CM

## 2019-08-31 MED ORDER — EZETIMIBE 10 MG PO TABS: 10.0000 mg | ORAL_TABLET | Freq: Every day | ORAL | 3 refills | Status: DC

## 2019-09-04 ENCOUNTER — Ambulatory Visit (INDEPENDENT_AMBULATORY_CARE_PROVIDER_SITE_OTHER): Payer: Medicare Other | Admitting: Psychology

## 2019-09-04 ENCOUNTER — Other Ambulatory Visit: Payer: Self-pay

## 2019-09-04 DIAGNOSIS — F4323 Adjustment disorder with mixed anxiety and depressed mood: Secondary | ICD-10-CM

## 2019-09-04 NOTE — BH Specialist Note (Signed)
Integrated Behavioral Health Visit 09/04/2019 Rebecca Hicks [id number]   Session Start time: 9 Session End time: 945 Total time: 60   Referring Provider: Dr. Andria Frames    PRESENTING CONCERNS: Patient and/or family reports the following symptoms/concerns: Pt reported the past year she has experienced several losses of family members.  Pt share that she is a caretaker for many people in her family.  Pt shared depressive syx of lack of motivation, loss or extra sleep, feeling down and overwhelmed. Pt denied SI.  Pt shared she retired recently and is adjusting to not working after working for 40 years.    Pt and Dr. Hartford Poli talked about giving to others can be draining and not giving back to self.  Pt and Dr. Hartford Poli discussed putting her health first and what steps that might mean.  Duration of problem: past year; Severity of problem: moderate  STRENGTHS (Protective Factors/Coping Skills): Supportive family, high insight  GOALS ADDRESSED: Patient will: 1.  Reduce symptoms of: depression: lack of motivation, issues with sleep 2.  Increase knowledge and/or ability of: self-management skills : engaging in walking, gardening re-engagement  3.  Demonstrate ability to: Increase healthy adjustment to current life circumstances  INTERVENTIONS: Interventions utilized:  Supportive Counseling Standardized Assessments completed: Not Needed  ASSESSMENT: Patient currently experiencing adjustment reaction to recent losses and   Patient may benefit from supportive therapy and engaging in self-care.   PLAN: 1. Follow up with behavioral health clinician on : thinking about ways to put self first to recharge  2. Behavioral recommendations: engage in self-care  3. Referral(s): [place] [facility]In [facility])  Erlinda Hong, PhD., LMFT-A

## 2019-09-08 ENCOUNTER — Encounter: Payer: Self-pay | Admitting: Family Medicine

## 2019-09-10 ENCOUNTER — Telehealth: Payer: Self-pay | Admitting: Pharmacist

## 2019-09-10 NOTE — Telephone Encounter (Signed)
Contacted patient for follow-up of blood sugar.  She reports receiving a steroid injection in her knees on Tuesday which has resulted in elevated blood sugars to the 300s.  She has increased her humalog dose to 20 units in the AM and has used 25 units in the evening with a sliding scale of as much as 10 additional units.  She denied any hypoglycemia and reports a fast blood sugar of 189 this AM.   She has not yet transitioned from Victoza to [place].  She will finish her supply of Victoza in the next few days and plans to start the Ozempic this weekend (3-4 days).   Overall, her mood subjectively was slightly improved. She was pleased with the counseling from Dr. Hartford Poli and plans to return for another counseling session next week.   I plan to follow-up with another phone call in 2 weeks.

## 2019-09-10 NOTE — Telephone Encounter (Signed)
Noted and agree. 

## 2019-09-16 ENCOUNTER — Ambulatory Visit: Payer: Medicare Other | Admitting: Psychology

## 2019-09-18 ENCOUNTER — Other Ambulatory Visit: Payer: Self-pay | Admitting: Physical Medicine and Rehabilitation

## 2019-09-18 DIAGNOSIS — M7918 Myalgia, other site: Secondary | ICD-10-CM

## 2019-09-18 DIAGNOSIS — R29898 Other symptoms and signs involving the musculoskeletal system: Secondary | ICD-10-CM

## 2019-09-22 ENCOUNTER — Other Ambulatory Visit: Payer: Self-pay

## 2019-09-22 MED ORDER — ROSUVASTATIN CALCIUM 20 MG PO TABS: 20.0000 mg | ORAL_TABLET | Freq: Every day | ORAL | 0 refills | Status: DC

## 2019-09-23 ENCOUNTER — Ambulatory Visit: Payer: Medicare Other | Admitting: Psychology

## 2019-09-25 ENCOUNTER — Telehealth: Payer: Self-pay | Admitting: Pharmacist

## 2019-09-25 NOTE — Telephone Encounter (Addendum)
Contacted patient for follow-up of blood sugar. She reports starting Ozempic 0.25mg  injection one week ago. She states that she is unable to gauge the efficacy since "it has not been a normal week." Her sister recently had a kidney transplant on 09/21/19, and she has not been able to stick to her normal diet. She reports one low BG reading of 60 and thinks she may have taken two doses of Tresiba accidentally. She also reports one high BG reading of 322 on the day of her sister's surgery. She had not taken her medications that morning and thinks stress may have been a factor as well. She has decreased her humalog dose to 10-20 unit with every meal and eats 2-3 times daily. She denies any adverse effects from her Ozempic and is confident that once her sister is discharged from the hospital tomorrow, she will be able to return to her normal diet.  Plan to continue Ozempic 0.25mg  injections once weekly until the end of the month. Likely will increase to 0.5mg  weekly injections pending BGs. Will plan to follow-up with another phone call at the end of the month.

## 2019-10-01 ENCOUNTER — Other Ambulatory Visit: Payer: Self-pay | Admitting: Family Medicine

## 2019-10-01 DIAGNOSIS — I10 Essential (primary) hypertension: Secondary | ICD-10-CM

## 2019-10-08 ENCOUNTER — Other Ambulatory Visit: Payer: Self-pay

## 2019-10-08 ENCOUNTER — Ambulatory Visit
Admission: RE | Admit: 2019-10-08 | Discharge: 2019-10-08 | Disposition: A | Discharge status: Home / Self Care | Payer: Medicare Other | Source: Ambulatory Visit | Attending: Physical Medicine and Rehabilitation | Admitting: Physical Medicine and Rehabilitation

## 2019-10-08 DIAGNOSIS — R29898 Other symptoms and signs involving the musculoskeletal system: Secondary | ICD-10-CM

## 2019-10-08 DIAGNOSIS — M7918 Myalgia, other site: Secondary | ICD-10-CM

## 2019-10-15 ENCOUNTER — Telehealth: Payer: Self-pay | Admitting: Pharmacist

## 2019-10-15 DIAGNOSIS — E11649 Type 2 diabetes mellitus with hypoglycemia without coma: Secondary | ICD-10-CM

## 2019-10-15 MED ORDER — INSULIN LISPRO (1 UNIT DIAL) 100 UNIT/ML (KWIKPEN): 10.0000 [IU] | PEN_INJECTOR | Freq: Two times a day (BID) | SUBCUTANEOUS | Status: DC

## 2019-10-15 MED ORDER — TRESIBA FLEXTOUCH 100 UNIT/ML ~~LOC~~ SOPN: 20.0000 [IU] | PEN_INJECTOR | Freq: Every day | SUBCUTANEOUS | Status: DC

## 2019-10-15 NOTE — Telephone Encounter (Signed)
Called patient for follow-up of blood sugar. Patient continues to tolerate Ozempic 0.25mg  weekly injections well. Denies side effects including nausea/vomiting. She has noticed a decrease in her appetite. Blood sugars running mostly in the 200s. Lowest blood sugar reading was 95, highest reading in the 400s when she did not take her medications. Currently taking Tresiba 40 units daily and Humalog 20 units BID.  Will increase Ozempic dose to 0.5 mg injection weekly. To avoid hypoglycemia, will also decrease Tresiba to 20 units daily and decrease Humalog to 10 units BID. Will follow-up via telephone call in 2-3 weeks to assess blood sugar and Ozempic tolerability.  Telephone call conducted by Richardine Service, PGY1 Pharmacy Resident.

## 2019-10-18 ENCOUNTER — Other Ambulatory Visit: Payer: Self-pay | Admitting: Family Medicine

## 2019-10-18 DIAGNOSIS — E11649 Type 2 diabetes mellitus with hypoglycemia without coma: Secondary | ICD-10-CM

## 2019-10-21 NOTE — Telephone Encounter (Signed)
Noted and agree. 

## 2019-10-22 ENCOUNTER — Other Ambulatory Visit: Payer: Medicare Other

## 2019-10-29 ENCOUNTER — Telehealth: Payer: Self-pay | Admitting: Pharmacist

## 2019-10-29 DIAGNOSIS — E11649 Type 2 diabetes mellitus with hypoglycemia without coma: Secondary | ICD-10-CM

## 2019-10-29 MED ORDER — INSULIN LISPRO (1 UNIT DIAL) 100 UNIT/ML (KWIKPEN): 15.0000 [IU] | PEN_INJECTOR | Freq: Two times a day (BID) | SUBCUTANEOUS | Status: DC

## 2019-10-29 MED ORDER — TRESIBA FLEXTOUCH 100 UNIT/ML ~~LOC~~ SOPN: 30.0000 [IU] | PEN_INJECTOR | Freq: Every day | SUBCUTANEOUS | Status: DC

## 2019-10-29 NOTE — Telephone Encounter (Signed)
Contacted patient RE blood sugar control.  Telephone call made with Lorel Monaco, PharmD conducting the interview.   Patient reports blood sugars have been "extremely high in the mornings" range 200-260 in the AM (denies night time snacks).  Subsequently in the mid-day (150-250) prior to mid-day and only large meal of the day.   Meds:   Ozempic increased from 0.25mg  to 0.5mg  this past Sunday  Tresiba dose is 25 units QPM if reading is > 200mg /dl  Takes 20 units if < 200.  Humalog dose is 15 units prior to meals (when she eats 1-2 meals per day).  Continue Ozempic 0.5mg  weekly.  Increase Tresbia from 25 to 30 units each evening.  Continue Humalog 15 units prior to meals.   Medication Samples have been provided to the patient.  Drug name: Ozempic             Qty: 2 pens  LOT: [id number]  Exp.Date: 02/20/2022  Dosing instructions: 0.5mg  weekly  The patient has been instructed regarding the correct time, dose, and frequency of taking this medication, including desired effects and most common side effects.   Janeann Forehand 11:20 AM 10/29/2019  Follow-up by phone in 4 weeks.

## 2019-10-29 NOTE — Telephone Encounter (Signed)
Noted and agree. 

## 2019-11-05 ENCOUNTER — Other Ambulatory Visit: Payer: Self-pay

## 2019-11-05 MED ORDER — ROSUVASTATIN CALCIUM 20 MG PO TABS: 20.0000 mg | ORAL_TABLET | Freq: Every day | ORAL | 0 refills | Status: DC

## 2019-11-26 ENCOUNTER — Telehealth: Payer: Self-pay | Admitting: Pharmacist

## 2019-11-26 DIAGNOSIS — E1165 Type 2 diabetes mellitus with hyperglycemia: Secondary | ICD-10-CM

## 2019-11-26 MED ORDER — OZEMPIC (1 MG/DOSE) 2 MG/1.5ML ~~LOC~~ SOPN: 1.0000 mg | PEN_INJECTOR | SUBCUTANEOUS | 5 refills | Status: DC

## 2019-11-26 NOTE — Telephone Encounter (Signed)
Contacted patient regarding blood sugar control. Patient reports sugars are still elevated in the mornings, ranging between 180-230 (fasting). Denies hypoglycemia. Reports no sugary bedtime snacks, however has been eating dinner late around 8:30PM. Reports medication adherence with no side effects.   Current DM medication regimen:  -Ozempic 0.5 mg once weekly -Tresiba 30 units daily at bedtime -Humalog 15 units with meals.  -Jardiance 25 mg daily -Metformin XR 1000mg  q am, metformin XR 500 mg q pm  Plan: Increase Ozempic from 0.5 mg to 1 mg once weekly. Prescription sent to [facility]. Continue other DM medications.  Follow-up by phone in 1 week to assess blood sugar and Ozempic tolerability.  Telephone call conducted by Lorel Monaco, PGY2 Pharmacy Resident.

## 2019-11-27 NOTE — Telephone Encounter (Signed)
Noted and agree. 

## 2019-12-03 ENCOUNTER — Encounter: Payer: Self-pay | Admitting: Emergency Medicine

## 2019-12-03 ENCOUNTER — Ambulatory Visit: Payer: Medicare Other | Admitting: Emergency Medicine

## 2019-12-03 ENCOUNTER — Other Ambulatory Visit: Payer: Self-pay

## 2019-12-03 DIAGNOSIS — G4733 Obstructive sleep apnea (adult) (pediatric): Secondary | ICD-10-CM

## 2019-12-03 DIAGNOSIS — D869 Sarcoidosis, unspecified: Secondary | ICD-10-CM

## 2019-12-03 NOTE — Progress Notes (Signed)
HPI:   ROV 12/03/19 --follow-up visit for 74 year old woman with a history of pulmonary cutaneous sarcoidosis, associated obstructive lung disease. She also has obstructive sleep apnea, GERD, allergic rhinitis. Her last visit with our office was in March of this year (video visit) at which time she was only using her Symbicort once a day, was not on CPAP. Her most recent chest CT was 09/05/2016, most recent chest x-ray 06/06/2018. She has not had any evidence for interstitial disease on imaging. She has nocturnal o2 - does not use it, unsure whether she needs it. She has not had any flares that have required pred. She has intermittently had some skin breakouts consistent with her sarcoid. She has albuterol, uses about 1x a week when she has heavier exertion or a long walk.  Remains on omeprazole 40mg , still has some hoarseness, no real cough.   Echocardiogram 02/12/2018 reviewed, shows intact LV function, evidence for grade 1 diastolic dysfunction, normal RV size and function, normal PAP   Exam:  Vitals:   12/03/19 0948  BP: (!) 168/86  Pulse: 93  Temp: 98 F (36.7 C)  TempSrc: Temporal  SpO2: 93%  Weight: 262 lb 3.2 oz (118.9 kg)  Height: 5\' 6"  (1.676 m)    Gen: Pleasant, obese, in no distress,  normal affect, obese   ENT: No lesions,  mouth clear,  oropharynx clear, no postnasal drip  Neck: No JVD, no Stridor  Lungs: No use of accessory muscles, somewhat distant, no crackles or wheezing  Cardiovascular: RRR, heart sounds normal, no murmur or gallops, no peripheral edema  Musculoskeletal: No deformities, no cyanosis or clubbing  Neuro: alert, non focal  Skin: Some old hyperpigmentation but no evidence of active sarcoid rash    09/05/16 CT chest  COMPARISON:  Chest CT 03/01/2015.  FINDINGS: Cardiovascular: Heart size is normal. There is no significant pericardial fluid, thickening or pericardial calcification. There is aortic atherosclerosis, as well as atherosclerosis of  the great vessels of the mediastinum and the coronary arteries, including calcified atherosclerotic plaque in the left main, left anterior descending, left circumflex and right coronary arteries.  Mediastinum/Nodes: No pathologically enlarged mediastinal or hilar lymph nodes. Please note that accurate exclusion of hilar adenopathy is limited on noncontrast CT scans. Esophagus is unremarkable in appearance. No axillary lymphadenopathy.  Lungs/Pleura: Small calcified granuloma in the periphery of the left upper lobe is unchanged. No other suspicious appearing pulmonary nodules or masses. No perilymphatic nodularity noted to suggest underlying sarcoidosis. Mild diffuse bronchial wall thickening with very mild centrilobular and paraseptal emphysema. No acute consolidative airspace disease. No pleural effusions.  Upper Abdomen: Aortic atherosclerosis.  Musculoskeletal: There are no aggressive appearing lytic or blastic lesions noted in the visualized portions of the skeleton.  IMPRESSION: 1. No imaging stigmata of sarcoidosis. 2. No acute findings are noted in the thorax.  3. Mild diffuse bronchial wall thickening with very mild centrilobular and paraseptal emphysema; imaging findings suggestive of underlying COPD. 4. Aortic atherosclerosis, in addition to left main and 3 vessel coronary artery disease. Please note that although the presence of coronary artery calcium documents the presence of coronary artery disease, the severity of this disease and any potential stenosis cannot be assessed on this non-gated CT examination. 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 She is due for CT chest as well as pulmonary function  testing. Unclear that she needs  the Symbicort, doesn't take reliably. She does occasionally use her albuterol we will continue this. Plan to stop the Symbicort for now. Walking oximetry today. COVID-19 vaccine up-to-date  We will plan to repeat your pulmonary function testing We will repeat your CT scan of the chest without contrast to evaluate sarcoidosis Walking oximetry today on room air. Stop Symbicort altogether Keep albuterol available to use 2 puffs if needed for shortness of breath, chest tightness, wheezing. COVID-19 vaccine is up-to-date Follow with Dr Lamonte Sakai next available after the testing is done to review the results together   Obstructive sleep apnea She has not used CPAP for months. She is concerned that the primary reason is poor mask fit. She also notes that when her oxygen is bled in to the CPAP she has a smothering feeling, unclear why this is the case. We will start with a best fit mask study through her DME, hopefully obtain a mask that is adequate. For now I will stop her supplemental oxygen at night although we will likely need to repeat an overnight oximetry if she can get back on CPAP to see if she requires. She has lost about 40 pounds so her needs may have changed.   Baltazar Apo, MD, PhD 12/03/2019, 10:10 AM [facility] [phone] or if no answer [phone]

## 2019-12-03 NOTE — Addendum Note (Signed)
Addended by: Gavin Potters R on: 12/03/2019 11:17 AM   Modules accepted: Orders

## 2019-12-03 NOTE — Assessment & Plan Note (Signed)
She has not used CPAP for months. She is concerned that the primary reason is poor mask fit. She also notes that when her oxygen is bled in to the CPAP she has a smothering feeling, unclear why this is the case. We will start with a best fit mask study through her DME, hopefully obtain a mask that is adequate. For now I will stop her supplemental oxygen at night although we will likely need to repeat an overnight oximetry if she can get back on CPAP to see if she requires. She has lost about 40 pounds so her needs may have changed.

## 2019-12-03 NOTE — Addendum Note (Signed)
Addended by: Gavin Potters R on: 12/03/2019 10:52 AM   Modules accepted: Orders

## 2019-12-03 NOTE — Patient Instructions (Signed)
We will plan to repeat your pulmonary function testing We will repeat your CT scan of the chest without contrast to evaluate sarcoidosis We will arrange for CPAP mask fit evaluation with your medical equipment company. Once we do this we will make it a priority to get you back on your CPAP every night. If we can get you back on CPAP then we will arrange for an overnight oximetry test to see if you require oxygen bled in with your positive pressure. For now we can stop your nighttime oxygen Walking oximetry today on room air. Stop Symbicort altogether Keep albuterol available to use 2 puffs if needed for shortness of breath, chest tightness, wheezing. COVID-19 vaccine is up-to-date Follow with Dr Lamonte Sakai next available after the testing is done to review the results together

## 2019-12-03 NOTE — Assessment & Plan Note (Addendum)
Sarcoidosis with associated obstructive lung disease although her most recent spirometry showed normal airflows. She is due for CT chest as well as pulmonary function testing. Unclear that she needs the Symbicort, doesn't take reliably. She does occasionally use her albuterol we will continue this. Plan to stop the Symbicort for now. Walking oximetry today. COVID-19 vaccine up-to-date  We will plan to repeat your pulmonary function testing We will repeat your CT scan of the chest without contrast to evaluate sarcoidosis Walking oximetry today on room air. Stop Symbicort altogether Keep albuterol available to use 2 puffs if needed for shortness of breath, chest tightness, wheezing. COVID-19 vaccine is up-to-date Follow with Dr Lamonte Sakai next available after the testing is done to review the results together

## 2019-12-10 ENCOUNTER — Telehealth: Payer: Self-pay | Admitting: Pharmacist

## 2019-12-10 DIAGNOSIS — E1165 Type 2 diabetes mellitus with hyperglycemia: Secondary | ICD-10-CM

## 2019-12-10 MED ORDER — OZEMPIC (1 MG/DOSE) 2 MG/1.5ML ~~LOC~~ SOPN: 0.5000 mg | PEN_INJECTOR | SUBCUTANEOUS | Status: DC

## 2019-12-10 NOTE — Telephone Encounter (Signed)
Noted and agree. 

## 2019-12-10 NOTE — Telephone Encounter (Addendum)
Contacted patient regarding blood glucose control. Patient reports average BG for the week was 238 and BG this morning was 178. Reports 1 episode of hypoglycemia (BG 52); patient explained low reading was due to sugar initially in the 300s and then took 25 units of Humalog.   Patient also reports since starting Ozempic 1 mg once weekly, she has experienced "2 dizzy spells" including today in which she felt disoriented to the point she couldn't drive. Patient states she has a history of vertigo, however has not had any episodes for a while. BG at the time was 178. Denies GI issues.  Current DM medication regimen:  -Ozempic 1 mg once weekly -Tresiba 30 units daily at bedtime -Humalog 15 units with meals (takes 10 units, if BG > 150 - takes 15 units) -Jardiance 25 mg daily -Metformin XR 1000mg  q am, metformin XR 500 mg q pm  Plan: Decreased Ozempic from 1 mg to 0.5 mg once weekly. Continued all other medications. Counseled patient not take 25 units of Humalog.  Follow-up by phone in 1 week to assess blood sugar and Ozempic tolerability.  Telephone call conducted by Lorel Monaco, PGY2 Pharmacy Resident.

## 2019-12-17 ENCOUNTER — Other Ambulatory Visit: Payer: Self-pay

## 2019-12-17 ENCOUNTER — Ambulatory Visit (INDEPENDENT_AMBULATORY_CARE_PROVIDER_SITE_OTHER)
Admission: RE | Admit: 2019-12-17 | Discharge: 2019-12-17 | Disposition: A | Discharge status: Home / Self Care | Payer: Medicare Other | Source: Ambulatory Visit | Attending: Emergency Medicine | Admitting: Emergency Medicine

## 2019-12-17 ENCOUNTER — Telehealth: Payer: Self-pay | Admitting: Pharmacist

## 2019-12-17 DIAGNOSIS — D869 Sarcoidosis, unspecified: Secondary | ICD-10-CM | POA: Diagnosis not present

## 2019-12-17 DIAGNOSIS — E1165 Type 2 diabetes mellitus with hyperglycemia: Secondary | ICD-10-CM

## 2019-12-17 NOTE — Telephone Encounter (Signed)
Contacted patient regarding blood glucose control. Patient went on vacation last week and forgot to take her Ozempic with her, so she was unable to administer last week's dose. Reports sugars have been consistently under 200 and denies sugars less than 70.   Current DM medication regimen:  -Ozempic1 mg once weekly -Tresiba 30 units daily at bedtime -Humalog 15 units with meals (takes 10 units, if BG > 150 - takes 15 units) -Jardiance 25 mg daily -Metformin XR 1000mg  q am, metformin XR 500 mg q pm  Plan: No changes to medications  Follow-up by phone in 1 week toassess blood sugar and Ozempic tolerability.  Telephone call conducted Caprice Beaver, PGY2Pharmacy Resident.

## 2019-12-23 ENCOUNTER — Other Ambulatory Visit (HOSPITAL_BASED_OUTPATIENT_CLINIC_OR_DEPARTMENT_OTHER): Payer: Medicare Other | Admitting: Pulmonary Disease

## 2019-12-24 ENCOUNTER — Telehealth: Payer: Self-pay | Admitting: Pharmacist

## 2019-12-24 DIAGNOSIS — E1140 Type 2 diabetes mellitus with diabetic neuropathy, unspecified: Secondary | ICD-10-CM

## 2019-12-24 DIAGNOSIS — Z794 Long term (current) use of insulin: Secondary | ICD-10-CM

## 2019-12-24 NOTE — Telephone Encounter (Signed)
Contacted patient regarding blood glucose control. Patient tolerating Ozempic 0.5mg  well with no complaints and appears adherent with current DM medications. Reports receiving a steroid injection last week which caused elevated sugars for a couple of days. This has now resolved and average sugars have been between 150-200s. Post-prandial sugar this morning was 170. Patient reports one symptomatic hypoglycemic event last week at the grocery store; treated with juice and fruits and sugar increased to 110.   Additionally, patient reports nausea and vomiting for the past two weeks. Patient previously treated for H.Pylori and is worried that she may have infection again. Patient plans to contact PCP to determine if she needs to make an appointment with her gastrologist.   Current DM medication regimen:  -Ozempic0.5mg  once weekly -Tresiba 30 units daily at bedtime -Humalog 15 units with meals(takes 10 units, if BG > 150 - takes 15 units) -Jardiance 25 mg daily -Metformin XR 1000mg  q am, metformin XR 500 mg q pm  Plan: No changes to medications; Contact PCP for further GI work-up.  Follow-up by phone in 1 month toassess blood sugars.  Telephone call conducted Caprice Beaver, PGY2Pharmacy Resident.

## 2019-12-24 NOTE — Telephone Encounter (Signed)
Noted and agree. 

## 2019-12-30 ENCOUNTER — Ambulatory Visit (INDEPENDENT_AMBULATORY_CARE_PROVIDER_SITE_OTHER): Payer: Medicare Other | Admitting: Emergency Medicine

## 2019-12-30 ENCOUNTER — Ambulatory Visit: Payer: Medicare Other | Admitting: Emergency Medicine

## 2019-12-30 ENCOUNTER — Encounter: Payer: Self-pay | Admitting: Emergency Medicine

## 2019-12-30 ENCOUNTER — Other Ambulatory Visit: Payer: Self-pay

## 2019-12-30 VITALS — BP 124/74 | HR 74 | Temp 96.8°F | Ht 65.0 in | Wt 260.0 lb

## 2019-12-30 DIAGNOSIS — D869 Sarcoidosis, unspecified: Secondary | ICD-10-CM | POA: Diagnosis not present

## 2019-12-30 DIAGNOSIS — G4733 Obstructive sleep apnea (adult) (pediatric): Secondary | ICD-10-CM | POA: Diagnosis not present

## 2019-12-30 LAB — PULMONARY FUNCTION TEST
DL/VA % pred: 91 %
DL/VA: 3.72 ml/min/mmHg/L
DLCO cor % pred: 91 %
DLCO cor: 18.31 ml/min/mmHg
DLCO unc % pred: 91 %
DLCO unc: 18.31 ml/min/mmHg
FEF 25-75 Post: 3.79 L/sec
FEF 25-75 Pre: 3.39 L/sec
FEF2575-%Change-Post: 11 %
FEF2575-%Pred-Post: 229 %
FEF2575-%Pred-Pre: 205 %
FEV1-%Change-Post: 3 %
FEV1-%Pred-Post: 147 %
FEV1-%Pred-Pre: 143 %
FEV1-Post: 2.73 L
FEV1-Pre: 2.65 L
FEV1FVC-%Change-Post: 1 %
FEV1FVC-%Pred-Pre: 112 %
FEV6-%Change-Post: 1 %
FEV6-%Pred-Post: 136 %
FEV6-%Pred-Pre: 134 %
FEV6-Post: 3.12 L
FEV6-Pre: 3.07 L
FEV6FVC-%Pred-Post: 104 %
FEV6FVC-%Pred-Pre: 104 %
FVC-%Change-Post: 1 %
FVC-%Pred-Post: 130 %
FVC-%Pred-Pre: 128 %
FVC-Post: 3.12 L
FVC-Pre: 3.07 L
Post FEV1/FVC ratio: 88 %
Post FEV6/FVC ratio: 100 %
Pre FEV1/FVC ratio: 86 %
Pre FEV6/FVC Ratio: 100 %
RV % pred: 109 %
RV: 2.53 L
TLC % pred: 118 %
TLC: 6.19 L

## 2019-12-30 NOTE — Progress Notes (Signed)
HPI:   ROV 12/03/19 --follow-up visit for 74 year old woman with a history of pulmonary cutaneous sarcoidosis, associated obstructive lung disease. She also has obstructive sleep apnea, GERD, allergic rhinitis. Her last visit with our office was in March of this year (video visit) at which time she was only using her Symbicort once a day, was not on CPAP. Her most recent chest CT was 09/05/2016, most recent chest x-ray 06/06/2018. She has not had any evidence for interstitial disease on imaging. She has nocturnal o2 - does not use it, unsure whether she needs it. She has not had any flares that have required pred. She has intermittently had some skin breakouts consistent with her sarcoid. She has albuterol, uses about 1x a week when she has heavier exertion or a long walk.  Remains on omeprazole 40mg , still has some hoarseness, no real cough.   Echocardiogram 02/12/2018 reviewed, shows intact LV function, evidence for grade 1 diastolic dysfunction, normal RV size and function, normal PAP  ROV 12/30/19 --Ms. Rigel is 47, follows up today for her sarcoidosis and associated obstructive lung disease, OSA and GERD, allergic rhinitis.  At her last visit we discussed trying to get back on CPAP.  I tried to arrange for a best fit mask study - had to cancel but is planning to reschedule.  She is not currently using it.  We repeated her pulmonary function testing and CT scan of the chest to follow her sarcoidosis is below.  She was not taking Symbicort reliably so we stopped it and she reports.  CT chest done 12/17/2019 reviewed by me, shows some persistent, stable subcentimeter calcified granulomatous disease, scattered cylindrical bronchiectatic change especially in the right middle lobe, right lower lobe, left lower lobe with some scattered mild groundglass opacity and reticulation Pulmonary function testing done today, reviewed by me showed normal lung volumes, spirometry suggestive of hyperinflation but  without any overt obstruction.  There was no bronchodilator response.  Diffusion capacity normal. MDM: Reviewed ophthalmology notes from 9/7 Reviewed CT chest, PFT  Exam:  Vitals:   12/30/19 1331  BP: 124/74  Pulse: 74  Temp: (!) 96.8 F (36 C)  TempSrc: Temporal  SpO2: 96%  Weight: 260 lb (117.9 kg)  Height: 5\' 5"  (1.651 m)    Gen: Pleasant, obese, in no distress,  normal affect, obese   ENT: No lesions,  mouth clear,  oropharynx clear, no postnasal drip  Neck: No JVD, no Stridor  Lungs: No use of accessory muscles, somewhat distant, no crackles or wheezing   Cardiovascular: RRR, heart sounds normal, no murmur or gallops, no peripheral edema  Musculoskeletal: No deformities, no cyanosis or clubbing  Neuro: alert, non focal  Skin: Some old hyperpigmentation but no evidence of active sarcoid rash    Obstructive sleep apnea Not using either her CPAP or her oxygen at this time.  She cannot work on try to get back on CPAP with a mask fitting assessment.  She is going to reschedule this.  For now she would like to discontinue the oxygen so she is not using it.  We may have to repeat an ONO on CPAP if she can get it restarted  SARCOIDOSIS, PULMONARY Reassuring PFT and CT chest.  No significant obstruction on spirometry.  She had stopped Symbicort and is not missing it.  We can stay off of it, keep albuterol available   Baltazar Apo, MD, PhD 12/30/2019, 1:48 PM [facility] [phone] or if no answer [phone]

## 2019-12-30 NOTE — Assessment & Plan Note (Signed)
Reassuring PFT and CT chest.  No significant obstruction on spirometry.  She had stopped Symbicort and is not missing it.  We can stay off of it, keep albuterol available

## 2019-12-30 NOTE — Assessment & Plan Note (Signed)
Not using either her CPAP or her oxygen at this time.  She cannot work on try to get back on CPAP with a mask fitting assessment.  She is going to reschedule this.  For now she would like to discontinue the oxygen so she is not using it.  We may have to repeat an ONO on CPAP if she can get it restarted

## 2019-12-30 NOTE — Patient Instructions (Addendum)
Your CT scan of the chest and pulmonary function testing are both stable compared with priors.  There is no evidence of active sarcoidosis in the lungs right now.  This is good news. We will not restart Symbicort at this time. Keep albuterol available use 2 puffs if needed for shortness of breath, chest tightness, wheezing. We will discontinue your home oxygen Reschedule your CPAP mask fitting session.  Our goal will be to get back on CPAP every night. Follow with Dr Lamonte Sakai in 6 months or sooner if you have any problems

## 2019-12-30 NOTE — Progress Notes (Signed)
Full PFT performed today. °

## 2020-01-06 ENCOUNTER — Other Ambulatory Visit: Payer: Self-pay | Admitting: Family Medicine

## 2020-01-09 ENCOUNTER — Other Ambulatory Visit: Payer: Self-pay | Admitting: Family Medicine

## 2020-01-09 DIAGNOSIS — F4321 Adjustment disorder with depressed mood: Secondary | ICD-10-CM

## 2020-01-12 ENCOUNTER — Other Ambulatory Visit: Payer: Self-pay | Admitting: Family Medicine

## 2020-01-12 DIAGNOSIS — E11649 Type 2 diabetes mellitus with hypoglycemia without coma: Secondary | ICD-10-CM

## 2020-01-13 ENCOUNTER — Other Ambulatory Visit: Payer: Self-pay | Admitting: Family Medicine

## 2020-01-13 DIAGNOSIS — E11649 Type 2 diabetes mellitus with hypoglycemia without coma: Secondary | ICD-10-CM

## 2020-01-19 ENCOUNTER — Encounter: Payer: Self-pay | Admitting: Family Medicine

## 2020-01-19 ENCOUNTER — Other Ambulatory Visit (HOSPITAL_BASED_OUTPATIENT_CLINIC_OR_DEPARTMENT_OTHER): Payer: Medicare Other | Admitting: Pulmonary Disease

## 2020-01-21 ENCOUNTER — Telehealth: Payer: Self-pay | Admitting: Pharmacist

## 2020-01-21 DIAGNOSIS — E1165 Type 2 diabetes mellitus with hyperglycemia: Secondary | ICD-10-CM

## 2020-01-21 NOTE — Telephone Encounter (Signed)
Contacted patient regarding blood glucose control. Patient reports high level of stress and anxiety, and low appetite/carbs due to an emergent [place] Vitrectomy procedure scheduled on October 1st (tomorrow). Reports morning sugars have been between 106-150s and 200s in the afternoon/evenings. Denies any sugars < 70 mg/dL. Reports medication adherence with Ozempic 0.5 mg weekly, Tresiba 30 units daily, metformin, and using 10-15 units of Humalog two times daily w/meals. Reports retina eye doctor instructed her to hold Jardiance starting September 29th and to hold all other DM medications the day of surgery (October 1st).   Current DM medication regimen:  -Ozempic0.5mg  once weekly  -Tresiba 30 units daily at bedtime -Humalog 15 units with meals(takes 10 units, if BG > 150 - takes 15 units) -Jardiance 25 mg daily -Metformin XR 1000mg  q am, metformin XR 500 mg q pm  Plan: No medication changes today. Instructed patient to discuss with retina doctor when to resume DM medications post-procedure.   Follow-up by phone in 1 week to assess sugars post procedure.   Rebecca Hicks, PharmD PGY2 Ambulatory Care Resident [place]

## 2020-01-21 NOTE — Telephone Encounter (Signed)
Noted and agree.  I appreciate the excellent pharmacy help.

## 2020-03-08 ENCOUNTER — Ambulatory Visit: Payer: Medicare Other | Admitting: Cardiovascular Disease

## 2020-03-28 ENCOUNTER — Other Ambulatory Visit: Payer: Self-pay | Admitting: Family Medicine

## 2020-03-29 ENCOUNTER — Other Ambulatory Visit: Payer: Self-pay | Admitting: Nurse Practitioner

## 2020-03-30 ENCOUNTER — Ambulatory Visit: Payer: Medicare Other

## 2020-04-05 ENCOUNTER — Other Ambulatory Visit: Payer: Self-pay

## 2020-04-05 ENCOUNTER — Ambulatory Visit (INDEPENDENT_AMBULATORY_CARE_PROVIDER_SITE_OTHER): Payer: Medicare Other

## 2020-04-05 DIAGNOSIS — Z23 Encounter for immunization: Secondary | ICD-10-CM

## 2020-04-05 NOTE — Progress Notes (Signed)
° °  Covid-19 Vaccination Clinic  Name:  Rebecca Hicks    MRN: [id number] DOB: 07-28-1945  04/05/2020  [name] was observed post Covid-19 immunization for 15 minutes without incident. She was provided with Vaccine Information Sheet and instruction to access the V-Safe system.   [name] was instructed to call 911 with any severe reactions post vaccine:  Difficulty breathing   Swelling of face and throat   A fast heartbeat   A bad rash all over body   Dizziness and weakness   Booster administered LD without complication.   Flu Vaccine administered RD without complication.

## 2020-04-18 ENCOUNTER — Telehealth: Payer: Self-pay

## 2020-04-18 DIAGNOSIS — E1142 Type 2 diabetes mellitus with diabetic polyneuropathy: Secondary | ICD-10-CM

## 2020-04-18 DIAGNOSIS — E1165 Type 2 diabetes mellitus with hyperglycemia: Secondary | ICD-10-CM

## 2020-04-18 DIAGNOSIS — E1140 Type 2 diabetes mellitus with diabetic neuropathy, unspecified: Secondary | ICD-10-CM

## 2020-04-18 DIAGNOSIS — E11319 Type 2 diabetes mellitus with unspecified diabetic retinopathy without macular edema: Secondary | ICD-10-CM

## 2020-04-18 MED ORDER — INSULIN LISPRO 100 UNIT/ML ~~LOC~~ SOLN: 20.0000 [IU] | Freq: Two times a day (BID) | SUBCUTANEOUS | 1 refills | Status: DC

## 2020-04-18 NOTE — Telephone Encounter (Signed)
Patient calls nurse line with concerns for receiving insulin. Patient was supposed to receive Humalog Kwikpen from mail pharmacy, however, shipment was delayed and has been out of insulin for two days now.   Patient is requesting that medication be sent to [facility] on [address]. Also, patient would like humalog vial in place of [facility], as this is the most cost effective option.   Will forward to PCP and Dr. Valentina Lucks.   Talbot Grumbling, RN

## 2020-04-18 NOTE — Telephone Encounter (Signed)
Humalog vial Rx sent to [facility]

## 2020-04-28 ENCOUNTER — Telehealth: Payer: Self-pay

## 2020-04-28 NOTE — Telephone Encounter (Signed)
Regardless of symptoms, if the appointment is non urgent, she should reschedule for later in the month.  If urgent and she is asymptomatic by 1/8, she can keep the appointment.  She must be symptom free for at least 48 hours before any face-to-face visit.  We can also offer her a virtual visit.   BH

## 2020-04-28 NOTE — Telephone Encounter (Signed)
Patient calls nurse line reporting a sore throat, cough, and congestion since 1/4. Patient denies fevers, chills, or SOB. Patient reports "the whole house is sick with something," however no one has been tested. Patient has an apt with PCP on 01/10 and with [facility]. Patient is trying to get tested somewhere, however no luck. Patient did get the booster on 12/14. With the new guidelines what are your thoughts on her coming to scheduled apts next week on 01/10 if her symptoms have resolved? Patient is actively trying to find a test in the meantime. Please advise.

## 2020-04-29 NOTE — Telephone Encounter (Signed)
Patient has been rescheduled for later in January.

## 2020-05-02 ENCOUNTER — Ambulatory Visit: Payer: Medicare Other | Admitting: Family Medicine

## 2020-05-02 ENCOUNTER — Ambulatory Visit: Payer: Medicare Other | Admitting: Pharmacist

## 2020-05-06 ENCOUNTER — Encounter: Payer: Self-pay | Admitting: Family Medicine

## 2020-05-12 ENCOUNTER — Other Ambulatory Visit: Payer: Self-pay

## 2020-05-12 DIAGNOSIS — E1165 Type 2 diabetes mellitus with hyperglycemia: Secondary | ICD-10-CM

## 2020-05-12 MED ORDER — OZEMPIC (1 MG/DOSE) 2 MG/1.5ML ~~LOC~~ SOPN: 0.5000 mg | PEN_INJECTOR | SUBCUTANEOUS | 6 refills | Status: DC

## 2020-05-13 ENCOUNTER — Telehealth: Payer: Self-pay | Admitting: *Deleted

## 2020-05-13 NOTE — Telephone Encounter (Signed)
Received fax requesting clarification for pts Ozempic. NOte says: Please clarify the DIRECTIONS for Ozempic pen 1mg  dose.  Is the pt injecting 0.5mg  or 1mg ? Doctor wrote for 1mg  pen.  Please clarify. Jadon Ressler Zimmerman Rumple, CMA

## 2020-05-15 NOTE — Progress Notes (Signed)
This encounter was created in error - please disregard.

## 2020-05-16 ENCOUNTER — Encounter: Payer: Medicare Other | Admitting: Cardiovascular Disease

## 2020-05-16 ENCOUNTER — Other Ambulatory Visit: Payer: Self-pay | Admitting: Family Medicine

## 2020-05-16 DIAGNOSIS — E1165 Type 2 diabetes mellitus with hyperglycemia: Secondary | ICD-10-CM

## 2020-05-16 MED ORDER — OZEMPIC (1 MG/DOSE) 2 MG/1.5ML ~~LOC~~ SOPN: 1.0000 mg | PEN_INJECTOR | SUBCUTANEOUS | 6 refills | Status: DC

## 2020-05-16 NOTE — Telephone Encounter (Signed)
Called patient and verified dose. 

## 2020-05-18 ENCOUNTER — Encounter: Payer: Self-pay | Admitting: Family Medicine

## 2020-05-18 ENCOUNTER — Other Ambulatory Visit: Payer: Self-pay

## 2020-05-18 ENCOUNTER — Ambulatory Visit: Payer: Medicare Other | Admitting: Family Medicine

## 2020-05-18 VITALS — BP 134/82 | HR 91 | Ht 66.0 in | Wt 263.0 lb

## 2020-05-18 DIAGNOSIS — N183 Chronic kidney disease, stage 3 unspecified: Secondary | ICD-10-CM

## 2020-05-18 DIAGNOSIS — E1165 Type 2 diabetes mellitus with hyperglycemia: Secondary | ICD-10-CM

## 2020-05-18 DIAGNOSIS — Z794 Long term (current) use of insulin: Secondary | ICD-10-CM

## 2020-05-18 DIAGNOSIS — E7800 Pure hypercholesterolemia, unspecified: Secondary | ICD-10-CM

## 2020-05-18 DIAGNOSIS — I10 Essential (primary) hypertension: Secondary | ICD-10-CM

## 2020-05-18 DIAGNOSIS — E1140 Type 2 diabetes mellitus with diabetic neuropathy, unspecified: Secondary | ICD-10-CM | POA: Diagnosis not present

## 2020-05-18 LAB — POCT GLYCOSYLATED HEMOGLOBIN (HGB A1C): Hemoglobin A1C: 9.1 % — AB (ref 4.0–5.6)

## 2020-05-18 NOTE — Patient Instructions (Signed)
I will call, likely Friday with blood results.   Get back to a regular routine of eating right and being more active. Come in 8:30 tomorrow for blood work.

## 2020-05-19 ENCOUNTER — Encounter: Payer: Self-pay | Admitting: Family Medicine

## 2020-05-19 ENCOUNTER — Other Ambulatory Visit: Payer: Medicare Other

## 2020-05-19 NOTE — Assessment & Plan Note (Signed)
Recheck lipids

## 2020-05-19 NOTE — Assessment & Plan Note (Signed)
Neuropathy on foot exam. On cymbalta.  No change

## 2020-05-19 NOTE — Progress Notes (Signed)
    SUBJECTIVE:   CHIEF COMPLAINT / HPI:   Follow up of chronic medical problems 1. DM A1C is 9.1  Only on higher dose of Ozempic for 1 week.  She also states that she has not been good with diet and exercize of late 2. Recent COVID infection.  Ripped through family.  Fortunately all were immunized and had mild cases.  She feels that she is fully recovered at this point. 3. Hypertension.  Denies CP or SOB. 4. Recent eye surg to repair macular hole.  This contributes to her decreased activity. 5 HPDP needs lipids and mammo and foot exam.  She knows she is due for tetanus but no indications this visit.  Knows she is due for a mammo.  Desires to make own appointment.    OBJECTIVE:   BP 134/82   Pulse 91   Ht 5\' 6"  (1.676 m)   Wt 263 lb (119.3 kg)   SpO2 95%   BMI 42.45 kg/m   VS and wt noted Lungs clear Cardiac RRR without m or g Ext 1+ edema bilaterally.  Foot exam done.  Note: because it is late in the day and she is non fasting, she will return in the morning for all labs.  ASSESSMENT/PLAN:   DM (diabetes mellitus), type 2, uncontrolled Still poor control.  Two changes are recent increase in Ozempic dose and she will double down on lifestyle changes.  Diabetic neuropathy, type II diabetes mellitus Neuropathy on foot exam. On cymbalta.  No change  Essential hypertension Stable on curent meds  Hyperlipidemia Recheck lipids.  CKD (chronic kidney disease) stage 3, GFR 30-59 ml/min ([place]) Recheck labs.       Zenia Resides, MD [place]

## 2020-05-19 NOTE — Assessment & Plan Note (Signed)
Recheck labs 

## 2020-05-19 NOTE — Assessment & Plan Note (Signed)
Still poor control.  Two changes are recent increase in Ozempic dose and she will double down on lifestyle changes.

## 2020-05-19 NOTE — Assessment & Plan Note (Signed)
Stable on curent meds

## 2020-05-23 ENCOUNTER — Other Ambulatory Visit: Payer: Medicare Other

## 2020-05-31 ENCOUNTER — Other Ambulatory Visit: Payer: Medicare Other

## 2020-05-31 ENCOUNTER — Other Ambulatory Visit: Payer: Self-pay

## 2020-05-31 DIAGNOSIS — I10 Essential (primary) hypertension: Secondary | ICD-10-CM

## 2020-05-31 DIAGNOSIS — E7800 Pure hypercholesterolemia, unspecified: Secondary | ICD-10-CM

## 2020-06-01 LAB — LIPID PANEL
Chol/HDL Ratio: 2.9 ratio (ref 0.0–4.4)
Cholesterol, Total: 165 mg/dL (ref 100–199)
HDL: 57 mg/dL (ref >39)
LDL Chol Calc (NIH): 90 mg/dL (ref 0–99)
Triglycerides: 98 mg/dL (ref 0–149)
VLDL Cholesterol Cal: 18 mg/dL (ref 5–40)

## 2020-06-01 LAB — CMP14+EGFR
ALT: 7 IU/L (ref 0–32)
AST: 16 IU/L (ref 0–40)
Albumin/Globulin Ratio: 1.2 (ref 1.2–2.2)
Albumin: 4 g/dL (ref 3.7–4.7)
Alkaline Phosphatase: 87 IU/L (ref 44–121)
BUN/Creatinine Ratio: 16 (ref 12–28)
BUN: 22 mg/dL (ref 8–27)
Bilirubin Total: 0.3 mg/dL (ref 0.0–1.2)
CO2: 22 mmol/L (ref 20–29)
Calcium: 9.5 mg/dL (ref 8.7–10.3)
Chloride: 104 mmol/L (ref 96–106)
Creatinine, Ser: 1.41 mg/dL — ABNORMAL HIGH (ref 0.57–1.00)
GFR calc Af Amer: 43 mL/min/{1.73_m2} — ABNORMAL LOW (ref >59)
GFR calc non Af Amer: 37 mL/min/{1.73_m2} — ABNORMAL LOW (ref >59)
Globulin, Total: 3.3 g/dL (ref 1.5–4.5)
Glucose: 128 mg/dL — ABNORMAL HIGH (ref 65–99)
Potassium: 4.8 mmol/L (ref 3.5–5.2)
Sodium: 143 mmol/L (ref 134–144)
Total Protein: 7.3 g/dL (ref 6.0–8.5)

## 2020-06-01 LAB — CBC
Hematocrit: 39.6 % (ref 34.0–46.6)
Hemoglobin: 12.4 g/dL (ref 11.1–15.9)
MCH: 26.4 pg — ABNORMAL LOW (ref 26.6–33.0)
MCHC: 31.3 g/dL — ABNORMAL LOW (ref 31.5–35.7)
MCV: 84 fL (ref 79–97)
Platelets: 180 10*3/uL (ref 150–450)
RBC: 4.69 x10E6/uL (ref 3.77–5.28)
RDW: 14.7 % (ref 11.7–15.4)
WBC: 6.2 10*3/uL (ref 3.4–10.8)

## 2020-06-06 ENCOUNTER — Encounter: Payer: Self-pay | Admitting: Cardiovascular Disease
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
## 2020-06-07 ENCOUNTER — Ambulatory Visit: Payer: Medicare Other | Admitting: Cardiovascular Disease

## 2020-06-07 ENCOUNTER — Other Ambulatory Visit: Payer: Self-pay

## 2020-06-07 ENCOUNTER — Encounter: Payer: Self-pay | Admitting: Cardiovascular Disease

## 2020-06-07 VITALS — BP 122/88 | HR 67 | Ht 66.0 in | Wt 261.8 lb

## 2020-06-07 DIAGNOSIS — I259 Chronic ischemic heart disease, unspecified: Secondary | ICD-10-CM | POA: Diagnosis not present

## 2020-06-07 DIAGNOSIS — E785 Hyperlipidemia, unspecified: Secondary | ICD-10-CM | POA: Diagnosis not present

## 2020-06-07 DIAGNOSIS — R0602 Shortness of breath: Secondary | ICD-10-CM

## 2020-06-07 MED ORDER — NITROGLYCERIN 0.4 MG SL SUBL: 0.4000 mg | SUBLINGUAL_TABLET | SUBLINGUAL | 3 refills | Status: AC | PRN

## 2020-06-07 MED ORDER — FUROSEMIDE 20 MG PO TABS: 20.0000 mg | ORAL_TABLET | ORAL | 3 refills | Status: DC | PRN

## 2020-06-07 NOTE — Patient Instructions (Addendum)
Medication Instructions:  Your physician recommends that you continue on your current medications as directed. Please refer to the Current Medication list given to you today.  *If you need a refill on your cardiac medications before your next appointment, please call your pharmacy*   Lab Work: none If you have labs (blood work) drawn today and your tests are completely normal, you will receive your results only by: Marland Kitchen MyChart Message (if you have MyChart) OR . A paper copy in the mail If you have any lab test that is abnormal or we need to change your treatment, we will call you to review the results.   Testing/Procedures: Your physician has requested that you have a lexiscan myoview. For further information please visit [email or web address]. Please follow instruction sheet, as given.   Follow-Up: At [facility], you and your health needs are our priority.  As part of our continuing mission to provide you with exceptional heart care, we have created designated Provider Care Teams.  These Care Teams include your primary Cardiologist (physician) and Advanced Practice Providers (APPs -  Physician Assistants and Nurse Practitioners) who all work together to provide you with the care you need, when you need it.  We recommend signing up for the patient portal called "MyChart".  Sign up information is provided on this After Visit Summary.  MyChart is used to connect with patients for Virtual Visits (Telemedicine).  Patients are able to view lab/test results, encounter notes, upcoming appointments, etc.  Non-urgent messages can be sent to your provider as well.   To learn more about what you can do with MyChart, go to [email or web address].    Your next appointment:   3 month(s)  The format for your next appointment:   In Person  Provider:    Richardson Dopp, PA-C  Robbie Lis, PA-C

## 2020-06-09 ENCOUNTER — Other Ambulatory Visit: Payer: Self-pay | Admitting: Family Medicine

## 2020-06-09 DIAGNOSIS — E11649 Type 2 diabetes mellitus with hypoglycemia without coma: Secondary | ICD-10-CM

## 2020-06-17 ENCOUNTER — Other Ambulatory Visit: Payer: Self-pay | Admitting: Family Medicine

## 2020-06-17 DIAGNOSIS — M5440 Lumbago with sciatica, unspecified side: Secondary | ICD-10-CM

## 2020-06-17 DIAGNOSIS — I10 Essential (primary) hypertension: Secondary | ICD-10-CM

## 2020-06-17 DIAGNOSIS — G8929 Other chronic pain: Secondary | ICD-10-CM

## 2020-06-23 ENCOUNTER — Telehealth (HOSPITAL_COMMUNITY): Payer: Self-pay | Admitting: *Deleted

## 2020-06-23 NOTE — Telephone Encounter (Signed)
Patient given detailed instructions per Myocardial Perfusion Study Information Sheet for the test on 06/27/20. Patient notified to arrive 15 minutes early and that it is imperative to arrive on time for appointment to keep from having the test rescheduled.  If you need to cancel or reschedule your appointment, please call the office within 24 hours of your appointment. . Patient verbalized understanding. Kirstie Peri

## 2020-06-27 ENCOUNTER — Ambulatory Visit (HOSPITAL_COMMUNITY): Payer: Medicare Other | Attending: Internal Medicine

## 2020-06-27 ENCOUNTER — Other Ambulatory Visit: Payer: Self-pay

## 2020-06-27 VITALS — Ht 66.0 in | Wt 261.0 lb

## 2020-06-27 DIAGNOSIS — I259 Chronic ischemic heart disease, unspecified: Secondary | ICD-10-CM | POA: Diagnosis present

## 2020-06-27 DIAGNOSIS — R0602 Shortness of breath: Secondary | ICD-10-CM | POA: Diagnosis present

## 2020-06-27 DIAGNOSIS — R110 Nausea: Secondary | ICD-10-CM | POA: Diagnosis present

## 2020-06-27 MED ORDER — AMINOPHYLLINE 25 MG/ML IV SOLN
75.0000 mg | Freq: Once | INTRAVENOUS | Status: AC
  Administered 2020-06-27: 75 mg via INTRAVENOUS

## 2020-06-27 MED ORDER — TECHNETIUM TC 99M TETROFOSMIN IV KIT
31.6000 | PACK | Freq: Once | INTRAVENOUS | Status: AC | PRN
  Administered 2020-06-27: 31.6 via INTRAVENOUS
  Filled 2020-06-27: qty 32

## 2020-06-27 MED ORDER — REGADENOSON 0.4 MG/5ML IV SOLN
0.4000 mg | Freq: Once | INTRAVENOUS | Status: AC
  Administered 2020-06-27: 0.4 mg via INTRAVENOUS

## 2020-06-27 NOTE — Progress Notes (Signed)
Patient ID: Rebecca Hicks                 DOB: Aug 23, 1945                    MRN: [id number]     HPI: Rebecca Hicks is a 75 y.o. female patient referred to lipid clinic by Dr. Acie Fredrickson. PMH is significant for diastolic CHF, HTN, Y6RS, HLD, CKD-III, morbid obesity, pulmonary sarcoidosis, and 3V coronary calcifications. Pt last seen by Dr. Acie Fredrickson on 06/07/20 and pt reported occasional episodes of chest pain and sudden dyspnea which may be secondary to angina. Pt scheduled for appt in lipid clinic today as well as stress test which was low risk overall and showed LVEF of 58%.  Patient was previously seen in lipid clinic in October 2019 at which time rosuvastatin was increased from 10 mg 3 days per week to 20 mg daily and eventually to 40 mg daily. However, she later reported intolerance with 40 mg and dose was reduced backed to 20 mg daily with no complaints. Follow-up labs in July 2020 showed mild LDL improvement from 210 to 126, therefore patient was agreeable to start [place]. PA was approved through 05/13/19 and copay cost was $24/month previously. However, pt was lost to follow-up, and it was unclear whether patient continued on therapy.  Patient presents today in good spirits ambulating with a cane. Reports medication adherence with rosuvastatin and ezetimibe daily in the evenings. Denies myalgia. Reports previously prescribed Repatha and had difficulty administering injection and does not want to take any additional injections. Discussed current diet and exercise regimen as detailed below.   Current Medications: rosuvastatin 20 mg daily, ezetimibe 10 mg daily  Intolerances: atorvastatin and rosuvastatin 40 mg daily (myalgias)  Risk Factors: coronary calcifications, DM, HTN, obesity, CKD, prior tobacco abuse, Fhx heart disease  LDL goal: <70 mg/dL  Diet:  -Breakfast: usually skips; scrambled egg and toast;  -Lunch/dinner: [place], mashed potatoes, baked apples, [place] sandwich, salad,  meatloaf, spaghetti, pork chops, green vegetables -Snacks: fruits  Exercise: none due to back/knee pain but will to try to walk again with walker around the neighborhood  Family History: Alcohol abuse in her brother and father; Breast cancer in her maternal aunt; Cervical cancer in her mother; Diabetes in an other family member; Heart disease in her brother (died of MI at age 33); MI in sister in late 51s; Hypertension in an other family member; Pancreatic cancer in her maternal uncle; Stroke in her father and paternal grandmother  Social History: former smoker (quit 1997 - 1 ppd x20 years)  Labs: 05/31/20: LDL 90, TG 98, TC 165, HDL 57 (rosuvastatin 20 mg daily, ezetimibe 10 mg daily)  01/20/15: LDL 272, TG 51, TC 372, HDL 90 (no therapy)  Past Medical History:  Diagnosis Date  . Abdominal pain 12/05/2011  . Abdominal pain, left upper quadrant 12/03/2012  . Acute on chronic renal failure ([place]) 12/25/2012  . Arthritis   . 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 Ataxia 12/15/2015  . Atypical chest pain 03/28/2016  . Cataract   . CERUMEN IMPACTION, RIGHT 01/31/2010   Qualifier: Diagnosis of  By: Dianah Field MD, Marcello Moores    . Chest tightness 02/23/2014  . Chronic kidney disease (CKD), stage II (mild) 03/05/2013   Looks like baseline creat = 1.2 -1.3.  Watch for overdiuresis.   . COLONIC POLYPS, ADENOMATOUS 05/03/2009   Every five year colonoscopy due to adenomatous polyp found 04/2009    .  DDD (degenerative disc disease), cervical 11/24/2016  . Diabetes mellitus   . Diabetic neuropathy, type II diabetes mellitus ([place]) 05/28/2008   Qualifier: Diagnosis of  By: Andria Frames MD, Gwyndolyn Saxon    . Diarrhea 01/21/2015  . DM (diabetes mellitus), type 2, uncontrolled ([place]) 02/07/2007   Qualifier: Diagnosis of  By: Wynetta Emery RN, Doroteo Bradford    . 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 Essential hypertension 02/07/2007   Qualifier: Diagnosis of  By: Wynetta Emery RN, Doroteo Bradford    . Excessive sleepiness 04/07/2014  . Fatigue 05/04/2011  .  Glaucoma   . GLAUCOMA NOS 02/25/2009   Qualifier: Diagnosis of  By: Andria Frames MD, Gwyndolyn Saxon    . Great toe pain 09/18/2012  . History of nuclear stress test    Myoview 11/17: EF 64, Normal pharmacologic nuclear stress test with no evidence of prior infarct or ischemia.  Marland Kitchen Hoarseness 10/31/2016  . Hyperlipidemia   . Hypertension   . LOW BACK PAIN SYNDROME 05/28/2008   Qualifier: Diagnosis of  By: Andria Frames MD, Gwyndolyn Saxon    . Morbid obesity ([place])   . Neck pain 11/24/2016  . Neuromuscular disorder ([place])   . Obesity hypoventilation syndrome ([place]) 11/10/2015  . Obstructive sleep apnea 11/17/2015  . Otalgia of right ear 11/24/2016  . Polyneuropathy in diabetes(357.2)   . Sarcoidosis   . Sinusitis acute 10/03/2010  . Sleep apnea    uses cpap  . 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 - She was fitted with a Small size [employer] Full Face Mask Simplus mask and heated humidification.   . Snoring 09/05/2015  . TB SKIN TEST, POSITIVE 02/07/2007   Annotation: with clear CXR mid 1990's Qualifier: History of  By: Milana Obey, Doroteo Bradford    . TOBACCO USE, QUIT 02/07/2007   Qualifier: Diagnosis of  By: Andria Frames MD, Gwyndolyn Saxon    . Tremor of both hands 12/15/2015  . U R I 03/15/2010   Qualifier: Diagnosis of  By: Andria Frames MD, Gwyndolyn Saxon      Current Outpatient Medications on File Prior to Visit  Medication Sig Dispense Refill  . TRESIBA FLEXTOUCH 100 UNIT/ML FlexTouch Pen INJECT SUBCUTANEOUSLY 40  UNITS DAILY 45 mL 3  . acetaminophen (TYLENOL) 650 MG CR tablet Take 650 mg by mouth every 8 (eight) hours as needed.     Marland Kitchen albuterol (PROAIR HFA) 108 (90 Base) MCG/ACT inhaler Inhale 2 puffs into the lungs every 6 (six) hours as needed for wheezing or shortness of breath. 1 Inhaler 2  . amLODipine (NORVASC) 10 MG tablet TAKE 1 TABLET BY MOUTH  DAILY 90 tablet 3  . aspirin 81 MG tablet Take 81 mg by mouth  daily.    . Blood Glucose Monitoring Suppl (ONE TOUCH ULTRA 2) w/Device KIT Test blood sugar three times per day. 1 each 0  . Blood Pressure Monitoring (BLOOD PRESSURE KIT) DEVI 1 Act by Does not apply route daily. 1 Device 0  . diphenoxylate-atropine (LOMOTIL) 2.5-0.025 MG tablet Take 1 tablet by mouth 4 (four) times daily as needed for diarrhea or loose stools. 120 tablet 6  . DULoxetine (CYMBALTA) 60 MG capsule TAKE 1 CAPSULE BY MOUTH  DAILY 90 capsule 3  . ezetimibe (ZETIA) 10 MG tablet Take 1 tablet (10 mg total) by mouth daily. 90 tablet 3  . furosemide (LASIX) 20 MG tablet Take 1 tablet (20 mg total) by mouth as needed. 30 tablet 3  .  HUMALOG KWIKPEN 100 UNIT/ML KwikPen INJECT SUBCUTANEOUSLY 20  UNITS TWICE DAILY BEFORE  MEALS 45 mL 3  . hydrochlorothiazide (HYDRODIURIL) 12.5 MG tablet TAKE 1 TABLET BY MOUTH  DAILY 90 tablet 3  . insulin lispro (HUMALOG) 100 UNIT/ML injection Inject 0.2 mLs (20 Units total) into the skin 2 (two) times daily before a meal. 10 mL 1  . Insulin Pen Needle (B-D ULTRAFINE III SHORT PEN) 31G X 8 MM MISC USE TO INJECT INSULIN AND  VICTOZA 4 TIMES DAILY 360 each 3  . JARDIANCE 25 MG TABS tablet TAKE 25 MG BY MOUTH DAILY 90 tablet 3  . losartan (COZAAR) 100 MG tablet TAKE 1 TABLET BY MOUTH  DAILY 90 tablet 3  . metFORMIN (GLUCOPHAGE-XR) 500 MG 24 hr tablet TAKE 1 TABLET BY MOUTH 3  TIMES DAILY BEFORE MEALS 270 tablet 3  . metoprolol succinate (TOPROL-XL) 50 MG 24 hr tablet TAKE 1 TABLET BY MOUTH  EVERY EVENING. TAKE WITH OR IMMEDIATELY FOLLOWING A  MEAL. 90 tablet 3  . nitroGLYCERIN (NITROSTAT) 0.4 MG SL tablet Place 1 tablet (0.4 mg total) under the tongue every 5 (five) minutes as needed for chest pain. 25 tablet 3  . omeprazole (PRILOSEC) 40 MG capsule TAKE 1 CAPSULE(40 MG) BY MOUTH EVERY MORNING 30 MINUTES BEFORE BREAKFAST 90 capsule 0  . ONETOUCH ULTRA test strip CHECK BLOOD SUGAR 3 TIMES  DAILY 300 strip 3  . rosuvastatin (CRESTOR) 20 MG tablet Take 1 tablet (20  mg total) by mouth daily. Please make yearly appt with Dr. Acie Fredrickson for May for future refills. 3rd and Final attempt 15 tablet 0  . Semaglutide, 1 MG/DOSE, (OZEMPIC, 1 MG/DOSE,) 2 MG/1.5ML SOPN Inject 1 mg into the skin once a week. Clarifying dose of previous Rx.  Correct dose is 1 mg weekly 4.5 mL 6  . spironolactone (ALDACTONE) 25 MG tablet TAKE 1 TABLET BY MOUTH ONCE DAILY 90 tablet 3  . timolol (TIMOPTIC) 0.5 % ophthalmic solution Place 1 drop into both eyes 2 times daily.    Marland Kitchen tolterodine (DETROL) 1 MG tablet Take 1 mg by mouth daily as needed.     . traZODone (DESYREL) 50 MG tablet TAKE 1 TABLET(50 MG) BY MOUTH AT BEDTIME AS NEEDED FOR SLEEP 90 tablet 3  . Vitamin D, Ergocalciferol, (DRISDOL) 1.25 MG (50000 UNIT) CAPS capsule Take 50,000 Units by mouth once a week.     No current facility-administered medications on file prior to visit.    Allergies  Allergen Reactions  . Penicillins Rash and Other (See Comments)    Has patient had a PCN reaction causing immediate rash, facial/tongue/throat swelling, SOB or lightheadedness with hypotension: Yes Has patient had a PCN reaction causing severe rash involving mucus membranes or skin necrosis: No Has patient had a PCN reaction that required hospitalization: No Has patient had a PCN reaction occurring within the last 10 years: Yes If all of the above answers are "NO", then may proceed with Cephalosporin use.     Assessment/Plan:  1. Hyperlipidemia - LDL not at goal <70 mg/dL. Medication adherence appears optimal with rosuvastatin 46m daily and ezetimibe 130mdaily and denies myalgia. Pt not willing to retry PCSK-9 inhibitors due to difficulty self-injecting. PA approved for Nexlizet 180-10 mg daily with a copay cost $227.32 for 90-day supply. Patient verbalized that she may be approaching the donut hole and would like to call her insurance prior to applying for [employer] assistance. Will plan to discontinue ezetimibe upon  starting Nexlizet  and continue rosuvastatin 53m daily. Encouraged patient to aim for a diet full of vegetables, fruit and lean meats (chicken, t[place] fish) and to limit carbs (bread, pasta, sugar, rice) and red meat consumption. Encouraged patient to exercise 20-30 minutes daily with the goal of 150 minutes per week. Patient verbalized understanding. Will call Friday, 3/11 to follow-up with Nexlizet and schedule fasting lipid panel and LFTs in 3 months.  ILorel Monaco PharmD, [id number] [place]  [address]Phone: [phone] Fax: [phone]

## 2020-06-28 ENCOUNTER — Ambulatory Visit (HOSPITAL_COMMUNITY): Payer: Medicare Other | Attending: Cardiology

## 2020-06-28 ENCOUNTER — Ambulatory Visit (INDEPENDENT_AMBULATORY_CARE_PROVIDER_SITE_OTHER): Payer: Medicare Other | Admitting: Pharmacist

## 2020-06-28 DIAGNOSIS — I2584 Coronary atherosclerosis due to calcified coronary lesion: Secondary | ICD-10-CM

## 2020-06-28 DIAGNOSIS — E785 Hyperlipidemia, unspecified: Secondary | ICD-10-CM

## 2020-06-28 DIAGNOSIS — I2510 Atherosclerotic heart disease of native coronary artery without angina pectoris: Secondary | ICD-10-CM | POA: Diagnosis not present

## 2020-06-28 LAB — MYOCARDIAL PERFUSION IMAGING
LV dias vol: 84 mL (ref 46–106)
LV sys vol: 36 mL
Peak HR: 106 {beats}/min
Rest HR: 75 {beats}/min
SDS: 0
SRS: 0
SSS: 0
TID: 0.91

## 2020-06-28 MED ORDER — ROSUVASTATIN CALCIUM 20 MG PO TABS: 20.0000 mg | ORAL_TABLET | Freq: Every day | ORAL | 3 refills | Status: DC

## 2020-06-28 MED ORDER — NEXLIZET 180-10 MG PO TABS: 1.0000 | ORAL_TABLET | Freq: Every day | ORAL | 3 refills | Status: DC

## 2020-06-28 MED ORDER — TECHNETIUM TC 99M TETROFOSMIN IV KIT
32.4000 | PACK | Freq: Once | INTRAVENOUS | Status: AC | PRN
  Administered 2020-06-28: 32.4 via INTRAVENOUS
  Filled 2020-06-28: qty 33

## 2020-06-28 NOTE — Patient Instructions (Addendum)
Nice to see you today!  Keep up the good work with diet and exercise. Aim for a diet full of vegetables, fruit and lean meats (chicken, [place], fish). Try to limit carbs (bread, pasta, sugar, rice) and red meat consumption.  Your goal LDL is <55 mg/dL, you're currently at 90 mg/dL  Medication Changes: Will contact you once the Nexlizet is ready the pharmacy.  - You will start Nexlizet (bempedoic acid 180 mg/ezetimibe 10 mg) once daily  - You will discontinue ezetimibe  Please give [place] a call at [phone] with any questions or concerns.

## 2020-06-29 ENCOUNTER — Other Ambulatory Visit: Payer: Self-pay | Admitting: Nurse Practitioner

## 2020-07-01 ENCOUNTER — Encounter: Payer: Self-pay | Admitting: Pharmacist

## 2020-07-01 ENCOUNTER — Ambulatory Visit (INDEPENDENT_AMBULATORY_CARE_PROVIDER_SITE_OTHER): Payer: Medicare Other | Admitting: Pharmacist

## 2020-07-01 ENCOUNTER — Other Ambulatory Visit: Payer: Self-pay

## 2020-07-01 ENCOUNTER — Telehealth: Payer: Self-pay | Admitting: Pharmacist

## 2020-07-01 DIAGNOSIS — I10 Essential (primary) hypertension: Secondary | ICD-10-CM | POA: Diagnosis not present

## 2020-07-01 DIAGNOSIS — E11649 Type 2 diabetes mellitus with hypoglycemia without coma: Secondary | ICD-10-CM

## 2020-07-01 DIAGNOSIS — E1165 Type 2 diabetes mellitus with hyperglycemia: Secondary | ICD-10-CM | POA: Diagnosis not present

## 2020-07-01 DIAGNOSIS — E11319 Type 2 diabetes mellitus with unspecified diabetic retinopathy without macular edema: Secondary | ICD-10-CM

## 2020-07-01 DIAGNOSIS — E1140 Type 2 diabetes mellitus with diabetic neuropathy, unspecified: Secondary | ICD-10-CM

## 2020-07-01 DIAGNOSIS — Z794 Long term (current) use of insulin: Secondary | ICD-10-CM

## 2020-07-01 DIAGNOSIS — E1142 Type 2 diabetes mellitus with diabetic polyneuropathy: Secondary | ICD-10-CM

## 2020-07-01 MED ORDER — TRESIBA FLEXTOUCH 100 UNIT/ML ~~LOC~~ SOPN: 24.0000 [IU] | PEN_INJECTOR | Freq: Every day | SUBCUTANEOUS | 3 refills | Status: DC

## 2020-07-01 MED ORDER — DEXCOM G6 TRANSMITTER MISC: 1.0000 | 3 refills | Status: DC

## 2020-07-01 MED ORDER — OZEMPIC (1 MG/DOSE) 2 MG/1.5ML ~~LOC~~ SOPN: 1.0000 mg | PEN_INJECTOR | SUBCUTANEOUS | 6 refills | Status: DC

## 2020-07-01 MED ORDER — AMLODIPINE BESYLATE 10 MG PO TABS: 10.0000 mg | ORAL_TABLET | Freq: Every day | ORAL | 3 refills | Status: DC

## 2020-07-01 MED ORDER — INSULIN LISPRO 100 UNIT/ML ~~LOC~~ SOLN: 10.0000 [IU] | Freq: Two times a day (BID) | SUBCUTANEOUS | Status: DC

## 2020-07-01 MED ORDER — DEXCOM G6 SENSOR MISC: 1.0000 | 11 refills | Status: DC

## 2020-07-01 MED ORDER — DEXCOM G6 RECEIVER DEVI: 1.0000 | 1 refills | Status: DC

## 2020-07-01 NOTE — Progress Notes (Signed)
S:     Chief Complaint  Patient presents with  . Medication Management    Diabetes - Lipids- Hypertension    Patient arrives in good spirits.  Presents for diabetes evaluation, education, and management Patient was referred and last seen by Primary Care Provider on 06/15/2020.   Patient reports Diabetes was diagnosed in 2008.   Insurance coverage/medication affordability: [employer] Medicare  Medication adherence reported good.   Current diabetes medications include: metformin, semaglutide (Ozempic), empagliflozin (Jardiance), insulin degludec Tyler Aas FlexTouch), insulin lispro (Humalog) Current hypertension medications include: amlodipine (not taking X 1 month), hydrochlorothiazide, losartan, metoprolol succinate, spironolactone  Current hyperlipidemia medications include: bempedoic acid-ezetimibe (Nexlizet), rosuvastatin   Patient reports hypoglycemic events. She recalls waking up feeling shaky during the night about 3 times in the last month. Pt checked blood glucose during those episodes and reports readings between 50-65 mg/dL.   Patient reported dietary habits: Eats 1 meal/day  Patient reports neuropathy (nerve pain). She says that her feet have been really painful recently. She states that duloxetine is helping moderately with pain, but she still experiences "tingling and burning" in her feet. Pt denies achy/crampy pain.    O:  Physical Exam Constitutional:      Appearance: Normal appearance.  Pulmonary:     Effort: Pulmonary effort is normal.  Musculoskeletal:     Right lower leg: No edema.     Left lower leg: No edema.  Neurological:     Mental Status: She is alert.  Psychiatric:        Mood and Affect: Mood normal.        Behavior: Behavior normal.        Thought Content: Thought content normal.        Judgment: Judgment normal.    Review of Systems  Cardiovascular: Negative for chest pain.  Neurological: Positive for tingling (bilateral feet).  All other  systems reviewed and are negative.    Lab Results  Component Value Date   HGBA1C 9.1 (A) 05/18/2020   Vitals:   07/01/20 1011  BP: (!) 152/88  Pulse: 70  SpO2: 98%    Lipid Panel     Component Value Date/Time   CHOL 165 05/31/2020 1346   TRIG 98 05/31/2020 1346   HDL 57 05/31/2020 1346   CHOLHDL 2.9 05/31/2020 1346   CHOLHDL 4.1 01/20/2015 1003   VLDL 10 01/20/2015 1003   LDLCALC 90 05/31/2020 1346   LDLDIRECT 223 (H) 10/13/2015 0932    Home fasting blood sugars: pt reports that her blood glucose readings have been staying in the 100's.    Clinical Atherosclerotic Cardiovascular Disease (ASCVD): Yes   A/P: Diabetes longstanding and currently improved control with higher dose Ozempic (semaglutide) 75md once weekly.  Recent decrease in appetite is concerning.  Patient reports multiple episodes of nocturnal hypoglycemia likely related to appetite and reduced calorie intake.  Patient is able  verbalize appropriate hypoglycemia management plan. Medication adherence appears improved and has lead to improved glycemic control. Patient has been taking 3-4 insulin shots and performing home blood testing 3-4 times daily.  -Decreased dose of basal insulin Tresiba (insulin degludec) from 30 to 24 units once daily in the AM.  -Continued  rapid insulin Humalog (insulin lispro) at 10-15 units prior to meals 3 times daily -Decreased dose of GLP-1 Ozempic (generic name semaglutide) by a few clicks from 1mg  due to decreased PO intake and skipping meals.  -Continued SGLT2-I Jardiance (generic name empagliflozin) at 25mg  daily.  -Extensively discussed pathophysiology  of diabetes, recommended lifestyle interventions, dietary effects on blood sugar control -Counseled on s/sx of and management of hypoglycemia - Plan to initiate DEXCOM CGM in 2 weeks.    ASCVD risk - secondary prevention in patient with diabetes.  -Continued aspirin 81 mg  -Continued rosuvastatin 20 mg.  Patient planning to  start Nexlizet in the next few days.    Hypertension longstanding and currently above goal due to discontinuation of amlodipine after she ran out of medication supply.   - restart amlodipine 10mg  once daily  Written patient instructions provided.  Total time in face to face counseling 40 minutes.   Follow up Pharmacist Clinic Visit in 2 weeks. Patient seen with Inis Sizer, PharmD Candidate, and Shauna Hugh, PharmD - PGY-1 Resident.

## 2020-07-01 NOTE — Telephone Encounter (Signed)
Left HIPAA complaint voicemail requesting call back regarding Nexlizet initiation for hyperlipidemia management. Will call patient again Tuesday, 3/15 to follow-up.

## 2020-07-01 NOTE — Assessment & Plan Note (Signed)
Diabetes longstanding and currently improved control with higher dose Ozempic (semaglutide) 45md once weekly.  Recent decrease in appetite is concerning.  Patient reports multiple episodes of nocturnal hypoglycemia likely related to appetite and reduced calorie intake.  Patient is able  verbalize appropriate hypoglycemia management plan. Medication adherence appears improved and has lead to improved glycemic control. Patient has been taking 3-4 insulin shots and performing home blood testing 3-4 times daily.  -Decreased dose of basal insulin Tresiba (insulin degludec) from 30 to 24 units once daily in the AM.  -Continued  rapid insulin Humalog (insulin lispro) at 10-15 units prior to meals 3 times daily -Decreased dose of GLP-1 Ozempic (generic name semaglutide) by a few clicks from 1mg  due to decreased PO intake and skipping meals.  -Continued SGLT2-I Jardiance (generic name empagliflozin) at 25mg  daily.  -Extensively discussed pathophysiology of diabetes, recommended lifestyle interventions, dietary effects on blood sugar control -Counseled on s/sx of and management of hypoglycemia - Plan to initiate DEXCOM CGM in 2 weeks.

## 2020-07-01 NOTE — Patient Instructions (Addendum)
It was nice seeing you today.  Blood Pressure: - Restart amlodipine 10 mg by mouth once daily   Cholesterol: - Start bempedoic acid/ezetimibe 180/10 mg by mouth once daily  Blood Sugar: - Decrease Ozempic dose by a few clicks (dial up to 1 mg then dial back by ~4 clicks) - Decrease insulin degludec Tyler Aas) to 24 units under the skin in the morning - Continue insulin lispro (Humalog) before meals, taking no more than 15 units at a time - Try to increase number of meals per day as able  We will submit for you to get a continuous glucose monitor (CGM).  We will see you in 2 weeks. Please call if you have any questions. Thank you!

## 2020-07-04 NOTE — Progress Notes (Signed)
Reviewed: I agree with Dr. Koval's documentation and management. 

## 2020-07-05 ENCOUNTER — Telehealth: Payer: Self-pay | Admitting: Pharmacist

## 2020-07-05 NOTE — Telephone Encounter (Signed)
Contacted patient regarding hyperlipidemia management. Patient reports she has received Nexlizet from her mail-order pharmacy and has started therapy. Dicussed if copay cost becomes unaffordable/expensive, to give [place] call to see if she qualifies for [employer] patient assistance. Patient verbalized understanding. Follow-up fasting lipid panel and LFTs labs scheduled in 3 months.

## 2020-07-06 ENCOUNTER — Telehealth: Payer: Self-pay

## 2020-07-06 NOTE — Telephone Encounter (Signed)
[employer] form for [employer] faxed to [employer] with supporting documentation. Original order in green folder in RN room.

## 2020-07-27 ENCOUNTER — Ambulatory Visit: Payer: Medicare Other | Admitting: Emergency Medicine

## 2020-07-29 ENCOUNTER — Telehealth: Payer: Self-pay

## 2020-07-29 NOTE — Telephone Encounter (Signed)
Patient calls nurse line requesting to speak with Dr. Valentina Lucks regarding setting up CGM. Patient has questions regarding what her settings should be.   Routing to pharmacy team.   Talbot Grumbling, RN

## 2020-08-01 NOTE — Telephone Encounter (Signed)
Returned call to patient with multiple questions.   CGM - setting discussed setting low alarm at 80 and high alarm at 250  Insulin/Tresiba supply Patient believes she may be in the "donut hole" She plans to contact [employer] today to determine process of eligibility.  She believes she may be able to get help.  I shared with her that I would have Hortencia Pilar follow-up tomorrow to determine if she needed assistance for her medications which were > $1000 per recent month supply  She states she has a 1 month supply of all DM medications.

## 2020-08-01 NOTE — Telephone Encounter (Signed)
Noted and agree. 

## 2020-08-02 NOTE — Telephone Encounter (Signed)
Just spoke to patient about patient assistance. She did speak with [employer] today and they mentioned she may not qualify due to income, but they told her do an application anyway to be sure. I will have an application in my box for her to sign. She said she could most likely come by next week (& provide proof of income as well).   Tyler Aas is the only medication shes needing assistance with, correct?

## 2020-08-02 NOTE — Telephone Encounter (Signed)
Awesome, thank you 

## 2020-08-18 NOTE — Progress Notes (Signed)
Submitted application for TRESIBA U-100 to [place] for patient assistance.   Phone: [phone]

## 2020-08-24 ENCOUNTER — Telehealth: Payer: Self-pay

## 2020-08-24 NOTE — Telephone Encounter (Signed)
Per MD [name], this is the only medication she needed assistance for, however the pt said she spoke with you about also needing assistance on Ozempic. Being that her copay was over $600 I believe. Should I move forward with trying to get that added to assistance with [employer]?

## 2020-08-24 NOTE — Telephone Encounter (Signed)
Received notification from [place] regarding approval for TRESIBA U-100. Patient assistance approved from 08/23/20 to 03/22/21.  A 4 month supply of medication will ship to clinic in 10-14 business days, and will auto refill.  Phone: [phone]

## 2020-08-25 NOTE — Telephone Encounter (Signed)
Perfect, I placed the app in your box for a signature. Thank you!

## 2020-08-26 NOTE — Progress Notes (Signed)
Submitted application for OZEMPIC 3ML to [place] for patient assistance.   Phone: [phone]

## 2020-08-30 ENCOUNTER — Other Ambulatory Visit: Payer: Self-pay | Admitting: Family Medicine

## 2020-08-30 DIAGNOSIS — E11649 Type 2 diabetes mellitus with hypoglycemia without coma: Secondary | ICD-10-CM

## 2020-08-31 ENCOUNTER — Ambulatory Visit: Payer: Medicare Other | Admitting: Cardiovascular Disease

## 2020-09-01 NOTE — Progress Notes (Addendum)
Received notification from [place] regarding approval for OZEMPIC 4MG /3ML (INJECT 1MG ) Patient assistance approved until 03/22/21.  Medication will auto ship to clinic (family medicine).  Phone: [phone]

## 2020-09-01 NOTE — Progress Notes (Signed)
Noted and agree. 

## 2020-09-06 ENCOUNTER — Telehealth: Payer: Self-pay

## 2020-09-06 NOTE — Telephone Encounter (Signed)
Patient calls nurse line requesting to speak with [name] regarding questions with Ozempic rx.   Routing to [place].   Talbot Grumbling, RN

## 2020-09-06 NOTE — Telephone Encounter (Signed)
Thank you :)

## 2020-09-08 ENCOUNTER — Encounter: Payer: Self-pay | Admitting: Family Medicine

## 2020-09-16 NOTE — Progress Notes (Signed)
Patient's sister presents to nurse clinic to pick up medication for her sister. Provided with medication shipment and pen needles.   Talbot Grumbling, RN

## 2020-09-27 ENCOUNTER — Other Ambulatory Visit: Payer: Self-pay | Admitting: Nurse Practitioner

## 2020-10-03 ENCOUNTER — Other Ambulatory Visit: Payer: Medicare Other

## 2020-10-21 ENCOUNTER — Other Ambulatory Visit: Payer: Medicare Other | Admitting: *Deleted

## 2020-10-21 ENCOUNTER — Other Ambulatory Visit: Payer: Self-pay

## 2020-10-21 DIAGNOSIS — E785 Hyperlipidemia, unspecified: Secondary | ICD-10-CM

## 2020-10-21 LAB — LIPID PANEL
Chol/HDL Ratio: 2.8 ratio (ref 0.0–4.4)
Cholesterol, Total: 181 mg/dL (ref 100–199)
HDL: 64 mg/dL (ref >39)
LDL Chol Calc (NIH): 102 mg/dL — ABNORMAL HIGH (ref 0–99)
Triglycerides: 84 mg/dL (ref 0–149)
VLDL Cholesterol Cal: 15 mg/dL (ref 5–40)

## 2020-10-21 LAB — HEPATIC FUNCTION PANEL
ALT: 15 IU/L (ref 0–32)
AST: 18 IU/L (ref 0–40)
Albumin: 4.3 g/dL (ref 3.7–4.7)
Alkaline Phosphatase: 61 IU/L (ref 44–121)
Bilirubin Total: 0.2 mg/dL (ref 0.0–1.2)
Bilirubin, Direct: 0.1 mg/dL (ref 0.00–0.40)
Total Protein: 7.1 g/dL (ref 6.0–8.5)

## 2020-10-23 ENCOUNTER — Other Ambulatory Visit: Payer: Self-pay | Admitting: Family Medicine

## 2020-10-23 DIAGNOSIS — I10 Essential (primary) hypertension: Secondary | ICD-10-CM

## 2020-10-27 ENCOUNTER — Telehealth: Payer: Self-pay | Admitting: Pharmacist

## 2020-10-27 DIAGNOSIS — E785 Hyperlipidemia, unspecified: Secondary | ICD-10-CM

## 2020-10-27 NOTE — Telephone Encounter (Signed)
Pt returned call to clinic. States she has been missing her doses of rosuvastatin and Nexlizet about 3 days a week. She was taking her meds in the evening and she has trouble remembering these doses. Advised pt to move both meds to AM dosing which she remembers more consistently. Will recheck lipids in another month.

## 2020-11-02 ENCOUNTER — Ambulatory Visit: Payer: Medicare Other | Admitting: Family Medicine

## 2020-11-02 ENCOUNTER — Other Ambulatory Visit: Payer: Self-pay

## 2020-11-02 ENCOUNTER — Encounter: Payer: Self-pay | Admitting: Family Medicine

## 2020-11-02 VITALS — BP 133/80 | HR 87 | Wt 249.6 lb

## 2020-11-02 DIAGNOSIS — R110 Nausea: Secondary | ICD-10-CM | POA: Diagnosis not present

## 2020-11-02 DIAGNOSIS — B0229 Other postherpetic nervous system involvement: Secondary | ICD-10-CM | POA: Diagnosis not present

## 2020-11-02 MED ORDER — GABAPENTIN 100 MG PO CAPS: 100.0000 mg | ORAL_CAPSULE | Freq: Two times a day (BID) | ORAL | 3 refills | Status: DC | PRN

## 2020-11-02 MED ORDER — ONDANSETRON 4 MG PO TBDP: 4.0000 mg | ORAL_TABLET | Freq: Three times a day (TID) | ORAL | 0 refills | Status: DC | PRN

## 2020-11-02 NOTE — Patient Instructions (Signed)
It was wonderful seeing you today.  I am sorry you are having problems with the pain on your side as well as the nausea.  For the nausea I have sent a prescription for Zofran.  You can take this every 8 hours as needed.  I only sent in 20 tablets and you will let these dissolve on your tongue.  I want you to follow-up with gastroenterology for further evaluation for your nausea.  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  I am sending in a medication called gabapentin which she can take daily and increase to twice daily if needed.  If you have any worsening symptoms, questions, concerns please call the clinic.  I hope you have a wonderful afternoon!

## 2020-11-02 NOTE — Progress Notes (Signed)
    SUBJECTIVE:   CHIEF COMPLAINT / HPI:   Nausea  Patient reports that over the last month she has had worsening nausea.  Initially it would happen every few days and be associated with eating.  For the last week it is better with every meal she was having nausea and vomiting.  She reports that she had an episode of this last year and was evaluated by GI doctor and had to take an antibiotic for 14 days and it resolved.  It looks like she may have had H. pylori a.  She was debating on following up with her gastroenterologist versus coming here for further evaluation.  Rash? Patient reports that she feels like she has a rash on her right side.  She reports that it is a burning sensation.  I asked if she had had shingles in the past and she reported yes she recently had shingles that was a little bit further back on that same side near that location.  She reports that this does actually feel similar to that same pain.  OBJECTIVE:   BP 133/80   Pulse 87   Wt 249 lb 9.6 oz (113.2 kg)   SpO2 98%   BMI 40.29 kg/m   General: Well-appearing 75 year old female in no acute distress Cardiac: Regular rate and rhythm with no murmurs appreciated.  Chest is nontender to palpation Respiratory: Normal work of breathing with lungs clear to auscultation bilaterally Abdomen: Soft, nontender, positive bowel sounds, no masses appreciated MSK: No gross abnormalities Derm: No rash appreciated.  Patient reports that she can feel a burning in her right side along the 6-seventh rib in a dermatomal distribution.  ASSESSMENT/PLAN:   Nausea without vomiting Patient with 1 month history which is progressively worsened.  Reports that it is associated with eating.  Had  seen GI in the past and is planning on following up with him.  She is calling to make an appointment with gastroenterology for further evaluation.  Provided prescription for Zofran ODT to help with the nausea.  Strict ED and return precautions  given.  Postherpetic neuralgia Burning sensation however an area where the patient had zoster in the past.  No rash appreciated today although the patient suspected that she did have a rash.  Signs and symptoms consistent with either postherpetic neuralgia versus a nerve compression.  Gave prescription for Neurontin 100 mg twice daily and patient can follow-up as needed.  Strict return precautions given.     Gifford Shave, MD [place]

## 2020-11-03 DIAGNOSIS — B0229 Other postherpetic nervous system involvement: Secondary | ICD-10-CM | POA: Insufficient documentation

## 2020-11-03 NOTE — Assessment & Plan Note (Signed)
Burning sensation however an area where the patient had zoster in the past.  No rash appreciated today although the patient suspected that she did have a rash.  Signs and symptoms consistent with either postherpetic neuralgia versus a nerve compression.  Gave prescription for Neurontin 100 mg twice daily and patient can follow-up as needed.  Strict return precautions given.

## 2020-11-03 NOTE — Assessment & Plan Note (Signed)
Patient with 1 month history which is progressively worsened.  Reports that it is associated with eating.  Had  seen GI in the past and is planning on following up with him.  She is calling to make an appointment with gastroenterology for further evaluation.  Provided prescription for Zofran ODT to help with the nausea.  Strict ED and return precautions given.

## 2020-11-05 ENCOUNTER — Encounter: Payer: Self-pay | Admitting: Cardiovascular Disease
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
## 2020-11-07 ENCOUNTER — Ambulatory Visit: Payer: Medicare Other | Admitting: Cardiovascular Disease

## 2020-11-07 ENCOUNTER — Other Ambulatory Visit: Payer: Self-pay

## 2020-11-07 ENCOUNTER — Encounter: Payer: Self-pay | Admitting: Cardiovascular Disease

## 2020-11-07 VITALS — BP 118/76 | HR 72 | Ht 66.0 in | Wt 249.6 lb

## 2020-11-07 DIAGNOSIS — I2510 Atherosclerotic heart disease of native coronary artery without angina pectoris: Secondary | ICD-10-CM

## 2020-11-07 DIAGNOSIS — E7800 Pure hypercholesterolemia, unspecified: Secondary | ICD-10-CM

## 2020-11-07 DIAGNOSIS — I2584 Coronary atherosclerosis due to calcified coronary lesion: Secondary | ICD-10-CM | POA: Diagnosis not present

## 2020-11-07 NOTE — Patient Instructions (Signed)
Medication Instructions:  Your physician recommends that you continue on your current medications as directed. Please refer to the Current Medication list given to you today.  Labwork: None ordered.  Testing/Procedures: None ordered.  Follow-Up: Your physician recommends that you schedule a follow-up appointment in:   12 months with Richardson Dopp, PA  Any Other Special Instructions Will Be Listed Below (If Applicable).     If you need a refill on your cardiac medications before your next appointment, please call your pharmacy.

## 2020-11-11 ENCOUNTER — Encounter: Payer: Self-pay | Admitting: Family Medicine

## 2020-11-24 ENCOUNTER — Other Ambulatory Visit: Payer: Medicare Other

## 2020-11-30 ENCOUNTER — Ambulatory Visit: Payer: Medicare Other | Admitting: Nurse Practitioner

## 2020-11-30 ENCOUNTER — Telehealth: Payer: Self-pay

## 2020-11-30 NOTE — Telephone Encounter (Signed)
LEFT VOICEMAIL REGARDING PAP MEDICATION PICKUP.  TRESIBA IS READY & LABELED IN MED ROOM [facility]

## 2020-12-07 ENCOUNTER — Telehealth: Payer: Self-pay

## 2020-12-07 NOTE — Telephone Encounter (Signed)
LVM REGARDING OTHER NOVO NORDISK MEDICATION (OZEMPIC) BEING READY FOR PICKUP.  MEDICATION LABELED & READY IN MED ROOM [facility].

## 2020-12-08 ENCOUNTER — Telehealth (INDEPENDENT_AMBULATORY_CARE_PROVIDER_SITE_OTHER): Payer: Medicare Other | Admitting: Pharmacist

## 2020-12-08 DIAGNOSIS — Z794 Long term (current) use of insulin: Secondary | ICD-10-CM

## 2020-12-08 DIAGNOSIS — E1140 Type 2 diabetes mellitus with diabetic neuropathy, unspecified: Secondary | ICD-10-CM

## 2020-12-08 NOTE — Progress Notes (Signed)
Subjective:    Patient ID: Rebecca Hicks, female    DOB: April 02, 1946, 75 y.o.   MRN: [id number]  HPI Patient is a 75 y.o. female who presents for diabetes management. She is in good spirits and presents via telephone. Patient was referred on 12/07/20 and last seen by provider, Dr. Caron Presume, on 11/02/20.   I connected with Rebecca Hicks by telephone and verified that I am speaking with the correct person using two identifiers.  Patient: home Provider: in clinic   I discussed the limitations, risks, security and privacy concerns of performing an evaluation and management service by telephone and the availability of in person appointments. I also discussed with the patient that there may be a patient responsible charge related to this service. The patient expressed understanding and agreed to proceed.  Insurance coverage/medication affordability: U[employer] Medicare  Current diabetes medications include: Tresiba 25-30 units once daily in the morning, Humalog 10-15 units 3-4x/day, jardiance '25mg'$ , metformin XR '500mg'$  TID, Ozempic '1mg'$  once weekly on Wednesdays Current hypertension medications include: amlodipine '10mg'$ , HCTZ 12.'5mg'$ , losartan '100mg'$ , metoprolol succinate '50mg'$ , spironolactone '25mg'$ ,  Current hyperlipidemia medications include: Nexlizet 180-'10mg'$ , rosuvastatin '20mg'$  Patient states that She is taking her medications as prescribed. Patient reports adherence with medications.  Do you feel that your medications are working for you?  Yes "they are working better than they were a year ago"  Have you been experiencing any side effects to the medications prescribed? no  Do you have any problems obtaining medications due to transportation or finances?  no     Patient reports hypoglycemic event in which her blood glucose went low due to not eating Patient reports polyuria (increased urination).  Patient denies polyphagia (increased appetite).  Patient denies polydipsia (increased thirst).   Patient reports neuropathy (nerve pain) in feet. Patient denies visual changes. Patient reports self foot exams.   Patient unable to pull up Dexcom data but states most of the time her blood glucose runs between 80-250.  Objective:   Labs:   Physical Exam Neurological:     Mental Status: She is alert and oriented to person, place, and time.   Lab Results  Component Value Date   HGBA1C 9.1 (A) 05/18/2020   HGBA1C 9.2 (A) 08/27/2019   HGBA1C 10.3 (A) 02/19/2019    Lab Results  Component Value Date   MICRALBCREAT 1,123.6 (H) 05/09/2017    Lipid Panel     Component Value Date/Time   CHOL 181 10/21/2020 1123   TRIG 84 10/21/2020 1123   HDL 64 10/21/2020 1123   CHOLHDL 2.8 10/21/2020 1123   CHOLHDL 4.1 01/20/2015 1003   VLDL 10 01/20/2015 1003   LDLCALC 102 (H) 10/21/2020 1123   LDLDIRECT 223 (H) 10/13/2015 0932    Clinical Atherosclerotic Cardiovascular Disease (ASCVD): Yes  The 10-year ASCVD risk score Mikey Bussing DC Jr., et al., 2013) is: 27.3%   Values used to calculate the score:     Age: 71 years     Sex: Female     Is Non-Hispanic African American: Yes     Diabetic: Yes     Tobacco smoker: No     Systolic Blood Pressure: 123456 mmHg     Is BP treated: Yes     HDL Cholesterol: 64 mg/dL     Total Cholesterol: 181 mg/dL   Assessment/Plan:   T2DM is not controlled likely due to lack of consistent follow-up. Medication adherence appears optimal. Will obtain updated A1C before discussing adjustments to therapy. Following instruction patient  verbalized understanding of treatment plan.    Continued basal insulin Tresiba 25-30 units once daily in the morning Continued  rapid insulin Humalog 10-15 units 3-4x/day Continued GLP-1 Ozempic '1mg'$  once weekly on Wednesdays Continued SGLT2-I Jardiance '25mg'$  Continued metformin XR '500mg'$  TID, Extensively discussed pathophysiology of diabetes, dietary effects on blood sugar control, and recommended lifestyle interventions.  Counseled  on s/sx of and management of hypoglycemia Next A1C anticipated at next visit.   Follow-up appointment two weeks to review sugar readings. Written patient instructions provided.  This appointment required 26 minutes of non-face to face patient care.  Thank you for involving pharmacy to assist in providing this patient's care.

## 2020-12-12 ENCOUNTER — Other Ambulatory Visit: Payer: Medicare Other

## 2020-12-14 ENCOUNTER — Other Ambulatory Visit: Payer: Medicare Other | Admitting: *Deleted

## 2020-12-14 ENCOUNTER — Other Ambulatory Visit: Payer: Self-pay

## 2020-12-14 DIAGNOSIS — E785 Hyperlipidemia, unspecified: Secondary | ICD-10-CM

## 2020-12-15 LAB — LIPID PANEL
Chol/HDL Ratio: 3 ratio (ref 0.0–4.4)
Cholesterol, Total: 180 mg/dL (ref 100–199)
HDL: 60 mg/dL (ref >39)
LDL Chol Calc (NIH): 100 mg/dL — ABNORMAL HIGH (ref 0–99)
Triglycerides: 110 mg/dL (ref 0–149)
VLDL Cholesterol Cal: 20 mg/dL (ref 5–40)

## 2020-12-21 ENCOUNTER — Encounter: Payer: Self-pay | Admitting: Family Medicine

## 2020-12-21 ENCOUNTER — Ambulatory Visit: Payer: Medicare Other | Admitting: Pharmacist

## 2020-12-21 ENCOUNTER — Other Ambulatory Visit: Payer: Self-pay

## 2020-12-21 ENCOUNTER — Ambulatory Visit: Payer: Medicare Other | Admitting: Family Medicine

## 2020-12-21 VITALS — BP 138/82 | HR 85 | Ht 66.0 in | Wt 250.0 lb

## 2020-12-21 DIAGNOSIS — E11649 Type 2 diabetes mellitus with hypoglycemia without coma: Secondary | ICD-10-CM | POA: Diagnosis not present

## 2020-12-21 DIAGNOSIS — R5383 Other fatigue: Secondary | ICD-10-CM

## 2020-12-21 DIAGNOSIS — E1140 Type 2 diabetes mellitus with diabetic neuropathy, unspecified: Secondary | ICD-10-CM

## 2020-12-21 DIAGNOSIS — Z794 Long term (current) use of insulin: Secondary | ICD-10-CM

## 2020-12-21 DIAGNOSIS — E1165 Type 2 diabetes mellitus with hyperglycemia: Secondary | ICD-10-CM

## 2020-12-21 DIAGNOSIS — R112 Nausea with vomiting, unspecified: Secondary | ICD-10-CM

## 2020-12-21 DIAGNOSIS — I2510 Atherosclerotic heart disease of native coronary artery without angina pectoris: Secondary | ICD-10-CM

## 2020-12-21 DIAGNOSIS — I2584 Coronary atherosclerosis due to calcified coronary lesion: Secondary | ICD-10-CM

## 2020-12-21 LAB — POCT GLYCOSYLATED HEMOGLOBIN (HGB A1C): HbA1c, POC (controlled diabetic range): 7.9 % — AB (ref 0.0–7.0)

## 2020-12-21 MED ORDER — OZEMPIC (2 MG/DOSE) 8 MG/3ML ~~LOC~~ SOPN: 2.0000 mg | PEN_INJECTOR | SUBCUTANEOUS | 0 refills | Status: DC

## 2020-12-21 NOTE — Patient Instructions (Signed)
Ms. Demske it was a pleasure seeing you today.   Please do the following:  Continue your current medications and we will send in a new prescription for Ozempic '2mg'$  to the drug company as directed today during your appointment. If you have any questions or if you believe something has occurred because of this change, call me or your doctor to let one of [place] know.  Continue checking blood sugars at home. It's really important that you record these and bring these in to your next doctor's appointment.  Continue making the lifestyle changes we've discussed together during our visit. Diet and exercise play a significant role in improving your blood sugars.  Follow-up with me in six weeks.    Hypoglycemia or low blood sugar:   Low blood sugar can happen quickly and may become an emergency if not treated right away.   While this shouldn't happen often, it can be brought upon if you skip a meal or do not eat enough. Also, if your insulin or other diabetes medications are dosed too high, this can cause your blood sugar to go to low.   Warning signs of low blood sugar include: Feeling shaky or dizzy Feeling weak or tired  Excessive hunger Feeling anxious or upset  Sweating even when you aren't exercising  What to do if I experience low blood sugar? Follow the Rule of 15 Check your blood sugar with your meter. If lower than 70, proceed to step 2.  Treat with 15 grams of fast acting carbs which is found in 3-4 glucose tablets. If none are available you can try hard candy, 1 tablespoon of sugar or honey,4 ounces of fruit juice, or 6 ounces of REGULAR soda.  Re-check your sugar in 15 minutes. If it is still below 70, do what you did in step 2 again. If your blood sugar has come back up, go ahead and eat a snack or small meal made up of complex carbs (ex. Whole grains) and protein at this time to avoid recurrence of low blood sugar.

## 2020-12-21 NOTE — Progress Notes (Signed)
Subjective:    Patient ID: Rebecca Hicks, female    DOB: 04-21-1946, 75 y.o.   MRN: [id number]  HPI Patient is a 75 y.o. female who presents for diabetes management. She is in good spirits and presents via telephone. Patient was referred on 12/07/20 and last seen by provider today before appointment. Last seen in pharmacy clinic on 12/08/20.  Insurance coverage/medication affordability: [employer] Medicare  Current diabetes medications include: Tresiba 30 units once daily in the morning, Humalog 10 units if BG <150 and for every 50 units over add 5 to max of 20 units, jardiance '25mg'$ , metformin XR '500mg'$  TID, Ozempic '1mg'$  once weekly on Wednesdays Current hypertension medications include: amlodipine '10mg'$ , HCTZ 12.'5mg'$ , losartan '100mg'$ , metoprolol succinate '50mg'$ , spironolactone '25mg'$ ,  Current hyperlipidemia medications include: Nexlizet 180-'10mg'$ , rosuvastatin '20mg'$  Patient states that She is taking her medications as prescribed. Patient reports adherence with medications.  Do you feel that your medications are working for you?  Yes "they are working better than they were a year ago"  Have you been experiencing any side effects to the medications prescribed? no  Do you have any problems obtaining medications due to transportation or finances?  no     Patient reports hypoglycemic event in which her blood glucose went low due to not eating Patient reports polyuria (increased urination).  Patient denies polyphagia (increased appetite).  Patient denies polydipsia (increased thirst).  Patient reports neuropathy (nerve pain) in feet. Patient denies visual changes. Patient reports self foot exams.   Objective:     Labs:   Physical Exam Neurological:     Mental Status: She is alert and oriented to person, place, and time.   Lab Results  Component Value Date   HGBA1C 7.9 (A) 12/21/2020   HGBA1C 9.1 (A) 05/18/2020   HGBA1C 9.2 (A) 08/27/2019    Lab Results  Component Value Date   MICRALBCREAT  1,123.6 (H) 05/09/2017    Lipid Panel     Component Value Date/Time   CHOL 180 12/14/2020 1403   TRIG 110 12/14/2020 1403   HDL 60 12/14/2020 1403   CHOLHDL 3.0 12/14/2020 1403   CHOLHDL 4.1 01/20/2015 1003   VLDL 10 01/20/2015 1003   LDLCALC 100 (H) 12/14/2020 1403   LDLDIRECT 223 (H) 10/13/2015 0932    Clinical Atherosclerotic Cardiovascular Disease (ASCVD): Yes  The 10-year ASCVD risk score Mikey Bussing DC Jr., et al., 2013) is: 32.8%   Values used to calculate the score:     Age: 28 years     Sex: Female     Is Non-Hispanic African American: Yes     Diabetic: Yes     Tobacco smoker: No     Systolic Blood Pressure: 0000000 mmHg     Is BP treated: Yes     HDL Cholesterol: 60 mg/dL     Total Cholesterol: 180 mg/dL   Assessment/Plan:   T2DM control is improving likely due to patient being adherent to medications and following up more consistently. Patient would benefit from increasing Ozempic to '2mg'$  to obtain slightly better control of blood glucose. Patient tends to experience afternoon hyperglycemia and will continue to re-visit ways to improve this if increase in ozempic does not help. Following instruction patient verbalized understanding of treatment plan.    Continued basal insulin Tresiba 30 units once daily in the morning Continued  rapid insulin Humalog 10-20 units with meals Increased dose of GLP-1 Ozempic to '2mg'$  once weekly on Wednesdays Continued SGLT2-I Jardiance '25mg'$  Continued metformin XR '500mg'$  TID  Extensively discussed pathophysiology of diabetes, dietary effects on blood sugar control, and recommended lifestyle interventions.  Counseled on s/sx of and management of hypoglycemia Next A1C anticipated in November 2022  Follow-up appointment six weeks to review sugar readings. Written patient instructions provided.  This appointment required 30 minutes of direct face to face patient care.  Thank you for involving pharmacy to assist in providing this patient's  care.  Meyer Russel, PharmD Candidate

## 2020-12-21 NOTE — Assessment & Plan Note (Signed)
T2DM control is improving likely due to patient being adherent to medications and following up more consistently. Patient would benefit from increasing Ozempic to '2mg'$  to obtain slightly better control of blood glucose. Patient tends to experience afternoon hyperglycemia and will continue to re-visit ways to improve this if increase in ozempic does not help. Following instruction patient verbalized understanding of treatment plan.    1. Continued basal insulin Tresiba 30 units once daily in the morning 2. Continued  rapid insulin Humalog 10-20 units with meals 3. Increased dose of GLP-1 Ozempic to '2mg'$  once weekly on Wednesdays 4. Continued SGLT2-I Jardiance '25mg'$  5. Continued metformin XR '500mg'$  TID 6. Extensively discussed pathophysiology of diabetes, dietary effects on blood sugar control, and recommended lifestyle interventions.  7. Counseled on s/sx of and management of hypoglycemia 8. Next A1C anticipated in November 2022

## 2020-12-21 NOTE — Patient Instructions (Signed)
I will call with blood test results.  I may want to order a liver ultrasound depending on the results.   Let me know if you develop new symptoms that point me in another direction.  Right now, I am most focused on your stomach. Good job on the diabetes.   I will ask Dr. Georgina Peer about ideas for your cholesterol.

## 2020-12-22 ENCOUNTER — Encounter: Payer: Self-pay | Admitting: Family Medicine

## 2020-12-22 LAB — CMP14+EGFR
ALT: 13 IU/L (ref 0–32)
AST: 20 IU/L (ref 0–40)
Albumin/Globulin Ratio: 1.6 (ref 1.2–2.2)
Albumin: 4.2 g/dL (ref 3.7–4.7)
Alkaline Phosphatase: 70 IU/L (ref 44–121)
BUN/Creatinine Ratio: 21 (ref 12–28)
BUN: 33 mg/dL — ABNORMAL HIGH (ref 8–27)
Bilirubin Total: 0.2 mg/dL (ref 0.0–1.2)
CO2: 20 mmol/L (ref 20–29)
Calcium: 9.3 mg/dL (ref 8.7–10.3)
Chloride: 99 mmol/L (ref 96–106)
Creatinine, Ser: 1.59 mg/dL — ABNORMAL HIGH (ref 0.57–1.00)
Globulin, Total: 2.7 g/dL (ref 1.5–4.5)
Glucose: 154 mg/dL — ABNORMAL HIGH (ref 65–99)
Potassium: 5.2 mmol/L (ref 3.5–5.2)
Sodium: 136 mmol/L (ref 134–144)
Total Protein: 6.9 g/dL (ref 6.0–8.5)
eGFR: 34 mL/min/{1.73_m2} — ABNORMAL LOW (ref >59)

## 2020-12-22 LAB — CBC
Hematocrit: 38.9 % (ref 34.0–46.6)
Hemoglobin: 12.7 g/dL (ref 11.1–15.9)
MCH: 28.1 pg (ref 26.6–33.0)
MCHC: 32.6 g/dL (ref 31.5–35.7)
MCV: 86 fL (ref 79–97)
Platelets: 208 10*3/uL (ref 150–450)
RBC: 4.52 x10E6/uL (ref 3.77–5.28)
RDW: 14.3 % (ref 11.7–15.4)
WBC: 6.2 10*3/uL (ref 3.4–10.8)

## 2020-12-22 LAB — LIPASE: Lipase: 33 U/L (ref 14–85)

## 2020-12-22 NOTE — Assessment & Plan Note (Signed)
Check labs, which are all reassuring.  Will get RUQ ultrasound in advance of GI consult

## 2020-12-22 NOTE — Progress Notes (Signed)
    SUBJECTIVE:   CHIEF COMPLAINT / HPI:   Multiple issues DM.  Finally we have a downward trend in A1C.  Patient is also being seen by our CPP Georgina Peer today, who has adjustment plans in regimen Secondary ASCVD prevention, not at goal on combo of rosuvastatin and Nexilient.  Followed by cards who is managing.   Recent painful rash on torso.  Rash has resolved but skin discomfort delivers.  She thinks it may have been shingles. I agree with her likely diagnosis.   Tired.  Falls to sleep easily.  Sleeps well at night.  Known OSA and uses CPAP  Only focal sign is some N&V.  Previous H pylori treated.  Has appointment with GI on 9/13.  No blood when vomits.  No change in stools.    OBJECTIVE:   BP 138/82   Pulse 85   Ht '5\' 6"'$  (1.676 m)   Wt 250 lb (113.4 kg)   SpO2 95%   BMI 40.35 kg/m   Lungs clear Cardiac RRR without m or g Abd benign  ASSESSMENT/PLAN:   Diabetic neuropathy, type II diabetes mellitus Improved.  Further adjustments via CPP Georgina Peer.  Fatigue Etiology unclear.  Focal sx suggest possible GI origin.  Nausea & vomiting Check labs, which are all reassuring.  Will get RUQ ultrasound in advance of GI consult     Zenia Resides, [place]

## 2020-12-22 NOTE — Assessment & Plan Note (Signed)
Etiology unclear.  Focal sx suggest possible GI origin.

## 2020-12-22 NOTE — Assessment & Plan Note (Signed)
Cholesterol not at goal for secondary prevention.  Adjustments per cards.

## 2020-12-22 NOTE — Assessment & Plan Note (Signed)
Improved.  Further adjustments via CPP Georgina Peer.

## 2021-01-03 ENCOUNTER — Other Ambulatory Visit: Payer: Medicare Other

## 2021-01-03 NOTE — Progress Notes (Signed)
01/03/2021 Rebecca Hicks [id number] 08/17/1945   Chief Complaint: Nausea and vomiting   History of Present Illness: Rebecca Hicks is a 75 year old female with a past medical history of hypertension, DM II, CKD stage III, obesity, sleep apnea, sarcoidosis, chronic diarrhea and colon polyps.  She is followed by Dr. Ardis Hicks.  She complains of having episodes of nausea and vomiting which started about 1 month ago.  The first episode occurred while she was in one of her doctors offices and she abruptly vomited partially digested food.  Since then, she continues to vomit 2-3 times weekly which occurs randomly.  Her emesis consisted of partially digested food.  No bilious emesis or hematemesis.  Several of the episodes of vomiting occurred at nighttime when she went to the bathroom to urinate then developed nausea with abrupt vomiting.  No associated abdominal pain with these episodes.  Her PCP ordered a RUQ sonogram which is scheduled on 01/05/2021.  No recent diarrhea.  She passes hard knot like stools daily.  No rectal bleeding or black stools.  She started Ozempic for her diabetes approximately 6 months ago, no other new medications.  She stated her nausea and vomiting episodes are similar to the symptoms she had when she was diagnosed with Helicobacter pylori.  She is concerned she has recurrence of H. pylori and request to be retested for this infection.  She underwent an EGD 12/31/2018 which identified H. pylori gastritis she was treated with Pylera for 2 weeks.  Her most recent colonoscopy was 05/14/2018 which identified 3 tubular adenomatous polyps which were removed from the sigmoid, ascending and cecum.  She was advised to repeat a colonoscopy in 3 years.    EGD 12/31/2018: - Irregular, non-nodular Z line. - Moderate pan-gastritis, biopsied to check for H. pylori. - The examination was otherwise normal. - CHRONIC ACTIVE GASTRITIS - H. PYLORI ORGANISMS PRESENT - NO INTESTINAL METAPLASIA  IDENTIFIED - SEE COMMENT  Colonoscopy 05/14/2018: - Three 1 to 3 mm polyps in the sigmoid colon, in the ascending colon and in the cecum, removed with a cold snare. Resected and retrieved. - The examination was otherwise normal on direct and retroflexion views. - 3 year colonoscopy recall  - TUBULAR ADENOMA (THREE) - NO HIGH GRADE DYSPLASIA OR MALIGNANCY.  Current Outpatient Medications on File Prior to Visit  Medication Sig Dispense Refill   acetaminophen (TYLENOL) 650 MG CR tablet Take 650 mg by mouth every 8 (eight) hours as needed.      albuterol (PROAIR HFA) 108 (90 Base) MCG/ACT inhaler Inhale 2 puffs into the lungs every 6 (six) hours as needed for wheezing or shortness of breath. 1 Inhaler 2   amLODipine (NORVASC) 10 MG tablet Take 1 tablet (10 mg total) by mouth daily. 90 tablet 3   aspirin 81 MG tablet Take 81 mg by mouth daily.     Bempedoic Acid-Ezetimibe (NEXLIZET) 180-10 MG TABS Take 1 tablet by mouth daily. 90 tablet 3   Blood Glucose Monitoring Suppl (ONE TOUCH ULTRA 2) w/Device KIT Test blood sugar three times per day. 1 each 0   Blood Pressure Monitoring (BLOOD PRESSURE KIT) DEVI 1 Act by Does not apply route daily. 1 Device 0   Cholecalciferol (VITAMIN D) 125 MCG (5000 UT) CAPS Take 5,000 Int'l Units/day by mouth daily.     Continuous Blood Gluc Receiver (DEXCOM G6 RECEIVER) DEVI 1 Device by Does not apply route as directed. Use with Dexcom G6 sensor and transmitter 1 each  1   Continuous Blood Gluc Sensor (DEXCOM G6 SENSOR) MISC Inject 1 applicator into the skin as directed. Change sensor every 10 days. 3 each 11   Continuous Blood Gluc Transmit (DEXCOM G6 TRANSMITTER) MISC Inject 1 Device into the skin as directed. Reuse 8 times with sensor changes. 1 each 3   DULoxetine (CYMBALTA) 60 MG capsule TAKE 1 CAPSULE BY MOUTH  DAILY 90 capsule 3   furosemide (LASIX) 20 MG tablet Take 1 tablet (20 mg total) by mouth as needed. 30 tablet 3   gabapentin (NEURONTIN) 100 MG capsule  Take 1 capsule (100 mg total) by mouth 2 (two) times daily as needed. 90 capsule 3   hydrochlorothiazide (HYDRODIURIL) 12.5 MG tablet TAKE 1 TABLET BY MOUTH  DAILY 90 tablet 3   insulin degludec (TRESIBA FLEXTOUCH) 100 UNIT/ML FlexTouch Pen Inject 24 Units into the skin daily. 45 mL 3   insulin lispro (HUMALOG) 100 UNIT/ML injection Inject 0.1-0.15 mLs (10-15 Units total) into the skin 2 (two) times daily before a meal.     Insulin Pen Needle (B-D ULTRAFINE III SHORT PEN) 31G X 8 MM MISC USE TO INJECT INSULIN AND  VICTOZA 4 TIMES DAILY 360 each 3   JARDIANCE 25 MG TABS tablet TAKE 25 MG BY MOUTH DAILY 90 tablet 3   losartan (COZAAR) 100 MG tablet TAKE 1 TABLET BY MOUTH  DAILY 90 tablet 3   metFORMIN (GLUCOPHAGE-XR) 500 MG 24 hr tablet TAKE 1 TABLET BY MOUTH 3  TIMES DAILY BEFORE MEALS 270 tablet 3   metoprolol succinate (TOPROL-XL) 50 MG 24 hr tablet TAKE 1 TABLET BY MOUTH  EVERY EVENING. TAKE WITH OR IMMEDIATELY FOLLOWING A  MEAL. 90 tablet 3   nitroGLYCERIN (NITROSTAT) 0.4 MG SL tablet Place 1 tablet (0.4 mg total) under the tongue every 5 (five) minutes as needed for chest pain. 25 tablet 3   omeprazole (PRILOSEC) 40 MG capsule TAKE 1 CAPSULE(40 MG) BY MOUTH DAILY 90 capsule 0   ondansetron (ZOFRAN ODT) 4 MG disintegrating tablet Take 1 tablet (4 mg total) by mouth every 8 (eight) hours as needed for nausea or vomiting. 20 tablet 0   ONETOUCH ULTRA test strip CHECK BLOOD SUGAR 3 TIMES  DAILY 300 strip 3   rosuvastatin (CRESTOR) 20 MG tablet Take 1 tablet (20 mg total) by mouth daily. 90 tablet 3   Semaglutide, 2 MG/DOSE, (OZEMPIC, 2 MG/DOSE,) 8 MG/3ML SOPN Inject 2 mg into the skin once a week. 3 mL 0   spironolactone (ALDACTONE) 25 MG tablet TAKE 1 TABLET BY MOUTH  DAILY 90 tablet 3   timolol (TIMOPTIC) 0.5 % ophthalmic solution Place 1 drop into both eyes 2 times daily.     traZODone (DESYREL) 50 MG tablet TAKE 1 TABLET(50 MG) BY MOUTH AT BEDTIME AS NEEDED FOR SLEEP 90 tablet 3    diphenoxylate-atropine (LOMOTIL) 2.5-0.025 MG tablet Take 1 tablet by mouth 4 (four) times daily as needed for diarrhea or loose stools. (Patient not taking: Reported on 01/04/2021) 120 tablet 6   tolterodine (DETROL) 1 MG tablet Take 1 mg by mouth daily as needed.  (Patient not taking: No sig reported)     traMADol (ULTRAM) 50 MG tablet Take 50 mg by mouth 3 (three) times daily as needed. (Patient not taking: No sig reported)     No current facility-administered medications on file prior to visit.   Allergies  Allergen Reactions   Penicillins Rash and Other (See Comments)    Has patient had a PCN  reaction causing immediate rash, facial/tongue/throat swelling, SOB or lightheadedness with hypotension: Yes Has patient had a PCN reaction causing severe rash involving mucus membranes or skin necrosis: No Has patient had a PCN reaction that required hospitalization: No Has patient had a PCN reaction occurring within the last 10 years: Yes If all of the above answers are "NO", then may proceed with Cephalosporin use.    Current Medications, Allergies, Past Medical History, Past Surgical History, Family History and Social History were reviewed in [employer] record.  Review of Systems:   Constitutional: Negative for fever, sweats, chills or weight loss.  Respiratory: Negative for shortness of breath.   Cardiovascular: Negative for chest pain, palpitations and leg swelling.  Gastrointestinal: See HPI.  Musculoskeletal: Negative for back pain or muscle aches.  Neurological: Negative for dizziness, headaches or paresthesias.    Physical Exam: BP (!) 158/100   Pulse 76   Ht _0  (1.676 m)   Wt 259 lb 4 oz (117.6 kg)   SpO2 97%   BMI 41.84 kg/m  General: 75 year old female in no acute distress. Head: Normocephalic and atraumatic. Eyes: No scleral icterus. Conjunctiva pink . Ears: Normal auditory acuity. Mouth: Dentition intact. No ulcers or lesions.  Lungs: Clear  throughout to auscultation. Heart: Regular rate and rhythm, no murmur. Abdomen: Soft, nontender and nondistended. No masses or hepatomegaly. Normal bowel sounds x 4 quadrants.  Rectal: Deferred.  Musculoskeletal: Symmetrical with no gross deformities. Extremities: No edema. Neurological: Alert oriented x 4. No focal deficits.  Psychological: Alert and cooperative. Normal mood and affect  Assessment and Recommendations:  34) 75 year old female with DM II with episodic N/V x 1 month, possibly associated with Ozempic versus gastroparesis -Await RUQ sonogram result as ordered by PCP -If RUQ sonogram unrevealing I will order a gastric emptying study to rule out diabetic gastroparesis -Recommended 3-4 small snacks sized meals daily.  Avoid eating late at night.  2) History of H. pylori gastritis -H. Pylori stool ag. Patient is quite concerned regarding her risk of having recurrent H. pylori gastritis.  She wishes to hold Prilosec for 2 weeks in order to complete H. pylori stool antigen test.  She will call our office if she develops active GERD symptoms while holding Prilosec.    3) Constipation -MiraLAX nightly as needed -TSH  4) CKD with rising creatinine level -BMP  5) History of tubular adenomatous colon polyps per colonoscopy 04/2018 -Next colonoscopy due 04/2021  Further recommendations to be determined after the above evaluation completed

## 2021-01-03 NOTE — Progress Notes (Signed)
Received notification from [place] regarding approval for DOSE INCREASE OZEMPIC '8MG'$ /3ML (INJECT '2MG'$ ). Patient assistance approved from 12/2020 to 03/22/21.  Phone: [phone]

## 2021-01-04 ENCOUNTER — Other Ambulatory Visit (INDEPENDENT_AMBULATORY_CARE_PROVIDER_SITE_OTHER): Payer: Medicare Other

## 2021-01-04 ENCOUNTER — Ambulatory Visit: Payer: Medicare Other | Admitting: Nurse Practitioner

## 2021-01-04 ENCOUNTER — Encounter: Payer: Self-pay | Admitting: Nurse Practitioner

## 2021-01-04 VITALS — BP 158/100 | HR 76 | Ht 66.0 in | Wt 259.2 lb

## 2021-01-04 DIAGNOSIS — R112 Nausea with vomiting, unspecified: Secondary | ICD-10-CM | POA: Diagnosis not present

## 2021-01-04 DIAGNOSIS — N183 Chronic kidney disease, stage 3 unspecified: Secondary | ICD-10-CM

## 2021-01-04 DIAGNOSIS — E1165 Type 2 diabetes mellitus with hyperglycemia: Secondary | ICD-10-CM

## 2021-01-04 DIAGNOSIS — Z8619 Personal history of other infectious and parasitic diseases: Secondary | ICD-10-CM

## 2021-01-04 DIAGNOSIS — K5900 Constipation, unspecified: Secondary | ICD-10-CM

## 2021-01-04 LAB — BASIC METABOLIC PANEL
BUN: 32 mg/dL — ABNORMAL HIGH (ref 6–23)
CO2: 23 mEq/L (ref 19–32)
Calcium: 9.4 mg/dL (ref 8.4–10.5)
Chloride: 105 mEq/L (ref 96–112)
Creatinine, Ser: 1.62 mg/dL — ABNORMAL HIGH (ref 0.40–1.20)
GFR: 31.18 mL/min — ABNORMAL LOW (ref >60.00)
Glucose, Bld: 171 mg/dL — ABNORMAL HIGH (ref 70–99)
Potassium: 5.2 mEq/L — ABNORMAL HIGH (ref 3.5–5.1)
Sodium: 136 mEq/L (ref 135–145)

## 2021-01-04 LAB — TSH: TSH: 2.78 u[IU]/mL (ref 0.35–5.50)

## 2021-01-04 NOTE — Patient Instructions (Addendum)
LABS:  A stool test has been ordered for you today. Our lab is located in the basement. Press "B" on the elevator. The lab is located at the first door on the left as you exit the elevator.  HEALTHCARE LAWS AND MY CHART RESULTS: Due to recent changes in healthcare laws, you may see the results of your imaging and laboratory studies on MyChart before your provider has had a chance to review them.   We understand that in some cases there may be results that are confusing or concerning to you. Not all laboratory results come back in the same time frame and the provider may be waiting for multiple results in order to interpret others.  Please give [place] 48 hours in order for your provider to thoroughly review all the results before contacting the office for clarification of your results.   Stop Prilosec 2 weeks before completing your stool test then restart after.  RECOMMENDATIONS: Eat 3-4 snack size meals. We will await the results of the ultrasound. Miralax- Dissolve one capful in 8 ounces of water and drink before bed as needed.  Please call our office if your symptoms worsen.  It was great seeing you today! Thank you for entrusting me with your care and choosing [facility].  Noralyn Pick, CRNP  The [place] [facility] providers would like to encourage you to use [facility] to communicate with providers for non-urgent requests or questions.  Due to long hold times on the telephone, sending your provider a message by [facility] may be faster and more efficient way to get a response. Please allow 48 business hours for a response.  Please remember that this is for non-urgent requests/questions.  If you are age 74 or older, your body mass index should be between 23-30. Your Body mass index is 41.84 kg/m. If this is out of the aforementioned range listed, please consider follow up with your Primary Care Provider.  If you are age 74 or younger, your body mass index should be between 19-25.  Your Body mass index is 41.84 kg/m. If this is out of the aformentioned range listed, please consider follow up with your Primary Care Provider.

## 2021-01-05 ENCOUNTER — Other Ambulatory Visit: Payer: Medicare Other

## 2021-01-05 NOTE — Progress Notes (Signed)
I agree with the above note, plan 

## 2021-01-06 ENCOUNTER — Other Ambulatory Visit (HOSPITAL_COMMUNITY): Payer: Self-pay

## 2021-01-10 ENCOUNTER — Ambulatory Visit
Admission: RE | Admit: 2021-01-10 | Discharge: 2021-01-10 | Disposition: A | Discharge status: Home / Self Care | Payer: Medicare Other | Source: Ambulatory Visit | Attending: Family Medicine | Admitting: Family Medicine

## 2021-01-10 DIAGNOSIS — R112 Nausea with vomiting, unspecified: Secondary | ICD-10-CM

## 2021-01-16 ENCOUNTER — Other Ambulatory Visit: Payer: Self-pay

## 2021-01-16 DIAGNOSIS — R112 Nausea with vomiting, unspecified: Secondary | ICD-10-CM

## 2021-01-16 DIAGNOSIS — E1165 Type 2 diabetes mellitus with hyperglycemia: Secondary | ICD-10-CM

## 2021-01-16 DIAGNOSIS — R197 Diarrhea, unspecified: Secondary | ICD-10-CM

## 2021-01-18 ENCOUNTER — Telehealth: Payer: Self-pay

## 2021-01-18 ENCOUNTER — Telehealth: Payer: Self-pay | Admitting: Nurse Practitioner

## 2021-01-18 NOTE — Telephone Encounter (Signed)
Inbound call from patient requesting a call from a nurse please in regards to the stool sample she needs to have done.

## 2021-01-18 NOTE — Telephone Encounter (Signed)
**Note De-Identified Ahri Olson Obfuscation** I started a Nexlizet PA through covermymeds. Key: [id number]

## 2021-01-18 NOTE — Telephone Encounter (Signed)
Spoke with pt regarding med pickup. Medication labeled and ready in med room fridge.

## 2021-01-19 ENCOUNTER — Other Ambulatory Visit: Payer: Medicare Other

## 2021-01-19 DIAGNOSIS — N183 Chronic kidney disease, stage 3 unspecified: Secondary | ICD-10-CM

## 2021-01-19 DIAGNOSIS — E1165 Type 2 diabetes mellitus with hyperglycemia: Secondary | ICD-10-CM

## 2021-01-19 DIAGNOSIS — K5900 Constipation, unspecified: Secondary | ICD-10-CM

## 2021-01-19 DIAGNOSIS — Z8619 Personal history of other infectious and parasitic diseases: Secondary | ICD-10-CM

## 2021-01-19 DIAGNOSIS — R112 Nausea with vomiting, unspecified: Secondary | ICD-10-CM

## 2021-01-19 NOTE — Telephone Encounter (Signed)
Called the patient back. No answer. Left a message in her voicemail including the option of contacting us via My Chart and the number for the lab if the questions are about collecting, storing or returning the specimen.

## 2021-01-20 ENCOUNTER — Telehealth: Payer: Self-pay | Admitting: Emergency Medicine

## 2021-01-20 NOTE — Telephone Encounter (Signed)
Left message for patient to call back  

## 2021-01-21 LAB — H. PYLORI ANTIGEN, STOOL: H pylori Ag, Stl: NEGATIVE

## 2021-01-25 NOTE — Telephone Encounter (Signed)
Patient presents to clinic to pick up medication. Provided medication to patient.   Talbot Grumbling, RN

## 2021-01-26 NOTE — Telephone Encounter (Signed)
Appt made.   Nothing further needed at this time.  

## 2021-01-27 NOTE — Telephone Encounter (Signed)
**Note De-Identified Micai Apolinar Obfuscation** [facility] Key: [id number] - PA Case ID: [id number] - Rx #: [id number] Outcome: Approved on September 28 NEXLIZET TAB 180/10MG  is approved through 04/22/2021. Your patient may now fill this prescription and it will be covered. Drug: Nexlizet 180-10MG  tablets Form: [employer] Medicare Part D Electronic   [employer] mail pharmacy is aware of this approval.

## 2021-02-01 ENCOUNTER — Ambulatory Visit: Payer: Medicare Other | Admitting: Pharmacist

## 2021-02-13 ENCOUNTER — Other Ambulatory Visit: Payer: Self-pay

## 2021-02-13 ENCOUNTER — Ambulatory Visit (HOSPITAL_COMMUNITY)
Admission: RE | Admit: 2021-02-13 | Discharge: 2021-02-13 | Disposition: A | Discharge status: Home / Self Care | Payer: Medicare Other | Source: Ambulatory Visit | Attending: Nurse Practitioner | Admitting: Nurse Practitioner

## 2021-02-13 DIAGNOSIS — E1165 Type 2 diabetes mellitus with hyperglycemia: Secondary | ICD-10-CM | POA: Insufficient documentation

## 2021-02-13 DIAGNOSIS — R197 Diarrhea, unspecified: Secondary | ICD-10-CM | POA: Insufficient documentation

## 2021-02-13 DIAGNOSIS — R112 Nausea with vomiting, unspecified: Secondary | ICD-10-CM | POA: Insufficient documentation

## 2021-02-13 MED ORDER — TECHNETIUM TC 99M SULFUR COLLOID
2.0000 | Freq: Once | INTRAVENOUS | Status: AC | PRN
  Administered 2021-02-13: 2 via INTRAVENOUS

## 2021-02-23 ENCOUNTER — Ambulatory Visit: Payer: Medicare Other | Admitting: Emergency Medicine

## 2021-02-24 LAB — HM DIABETES EYE EXAM

## 2021-03-08 ENCOUNTER — Ambulatory Visit: Payer: Medicare Other | Admitting: Family Medicine

## 2021-03-10 ENCOUNTER — Other Ambulatory Visit: Payer: Self-pay | Admitting: Family Medicine

## 2021-03-10 DIAGNOSIS — E11649 Type 2 diabetes mellitus with hypoglycemia without coma: Secondary | ICD-10-CM

## 2021-03-23 ENCOUNTER — Ambulatory Visit (INDEPENDENT_AMBULATORY_CARE_PROVIDER_SITE_OTHER): Payer: Medicare Other

## 2021-03-23 ENCOUNTER — Ambulatory Visit: Payer: Medicare Other | Admitting: Family Medicine

## 2021-03-23 ENCOUNTER — Other Ambulatory Visit: Payer: Self-pay

## 2021-03-23 ENCOUNTER — Encounter: Payer: Self-pay | Admitting: Family Medicine

## 2021-03-23 VITALS — BP 148/93 | HR 72 | Ht 66.0 in | Wt 258.4 lb

## 2021-03-23 DIAGNOSIS — R112 Nausea with vomiting, unspecified: Secondary | ICD-10-CM

## 2021-03-23 DIAGNOSIS — N183 Chronic kidney disease, stage 3 unspecified: Secondary | ICD-10-CM

## 2021-03-23 DIAGNOSIS — Z23 Encounter for immunization: Secondary | ICD-10-CM | POA: Diagnosis not present

## 2021-03-23 DIAGNOSIS — E7800 Pure hypercholesterolemia, unspecified: Secondary | ICD-10-CM

## 2021-03-23 DIAGNOSIS — I10 Essential (primary) hypertension: Secondary | ICD-10-CM

## 2021-03-23 DIAGNOSIS — E1140 Type 2 diabetes mellitus with diabetic neuropathy, unspecified: Secondary | ICD-10-CM

## 2021-03-23 DIAGNOSIS — Z794 Long term (current) use of insulin: Secondary | ICD-10-CM

## 2021-03-23 DIAGNOSIS — N1832 Chronic kidney disease, stage 3b: Secondary | ICD-10-CM

## 2021-03-23 DIAGNOSIS — E1122 Type 2 diabetes mellitus with diabetic chronic kidney disease: Secondary | ICD-10-CM

## 2021-03-23 LAB — POCT GLYCOSYLATED HEMOGLOBIN (HGB A1C): HbA1c, POC (controlled diabetic range): 8 % — AB (ref 0.0–7.0)

## 2021-03-23 NOTE — Assessment & Plan Note (Signed)
Restart Ozempic 

## 2021-03-23 NOTE — Progress Notes (Signed)
0

## 2021-03-23 NOTE — Assessment & Plan Note (Signed)
Restart Ozempic and check BMP

## 2021-03-23 NOTE — Assessment & Plan Note (Signed)
Check BMP .  

## 2021-03-23 NOTE — Assessment & Plan Note (Signed)
Uncertain control.  She will check blood pressures in her health journal to decide need to increase meds.

## 2021-03-23 NOTE — Assessment & Plan Note (Signed)
Reassured by normal GI [facility].  Doubt Ozempic causing.  She will keep journal to as she restarts Ozempic.

## 2021-03-23 NOTE — Patient Instructions (Addendum)
Start back on the Ozempic at the 0.5 mg dose.  Build slowly.  Keep a journal to see how it affects the nausea. I hope you get back to losing weight.   The A1C of 8.0 is not bad, getting back on the Ozempic should help it reach goal. I will call with the kidney and cholesterol blood test. You got both flu and covid shots today. You can get the shingles vaccine anytime.   See me in three months if things are going well.   Also check blood pressure daily and enter into the journal.  Ideally, we want your BP consistently below 140/90.  I will take the top number creeping up to below 150.

## 2021-03-23 NOTE — Progress Notes (Signed)
    SUBJECTIVE:   CHIEF COMPLAINT / HPI:   FU multiple problems: Nausea: GI has done a thorough evaluation.  No clear etiology.  GI recommended be off Ozempic, which she has done x 1 month.  She has not noted any improvement in the nausea and also notes that nausea preceded Ozempic therapy. DM:  Off Ozempic as above.  Also note 9 lb weight gain since August.  She and I are delighted that A1C only increased to 8.0.  She wants to restart Ozempic. Morbid obesity.  Ozempic helped curb appetite.  Anxious to restart.  She knows the importance of weight loss for her diabetic control.   High cholesterol.  Compliant with meds now, not before. Wants flu and covid vaccines today.      OBJECTIVE:   BP (!) 148/93   Pulse 72   Ht 5\' 6"  (1.676 m)   Wt 258 lb 6 oz (117.2 kg)   SpO2 97%   BMI 41.70 kg/m   Lungs clear Cardiac rRR without m or g  ASSESSMENT/PLAN:   Morbid obesity Restart Ozempic.    DM (diabetes mellitus), type 2 with renal complications (HCC) Restart Ozempic and check BMP  Nausea & vomiting Reassured by normal GI WU.  Doubt Ozempic causing.  She will keep journal to as she restarts Ozempic.   Essential hypertension Uncertain control.  She will check blood pressures in her health journal to decide need to increase meds.  CKD (chronic kidney disease) stage 3, GFR 30-59 ml/min (HCC) Check BMP     Zenia Resides, MD [place]

## 2021-03-24 ENCOUNTER — Encounter: Payer: Self-pay | Admitting: Family Medicine

## 2021-03-24 LAB — BASIC METABOLIC PANEL
BUN/Creatinine Ratio: 19 (ref 12–28)
BUN: 29 mg/dL — ABNORMAL HIGH (ref 8–27)
CO2: 24 mmol/L (ref 20–29)
Calcium: 9.6 mg/dL (ref 8.7–10.3)
Chloride: 103 mmol/L (ref 96–106)
Creatinine, Ser: 1.55 mg/dL — ABNORMAL HIGH (ref 0.57–1.00)
Glucose: 174 mg/dL — ABNORMAL HIGH (ref 70–99)
Potassium: 4.7 mmol/L (ref 3.5–5.2)
Sodium: 141 mmol/L (ref 134–144)
eGFR: 35 mL/min/{1.73_m2} — ABNORMAL LOW (ref >59)

## 2021-03-24 LAB — LDL CHOLESTEROL, DIRECT: LDL Direct: 96 mg/dL (ref 0–99)

## 2021-03-26 ENCOUNTER — Other Ambulatory Visit: Payer: Self-pay | Admitting: Nurse Practitioner

## 2021-03-28 ENCOUNTER — Ambulatory Visit: Payer: Medicare Other | Admitting: Pharmacist

## 2021-03-30 ENCOUNTER — Ambulatory Visit: Payer: Medicare Other | Admitting: Pharmacist

## 2021-03-30 ENCOUNTER — Other Ambulatory Visit: Payer: Self-pay

## 2021-03-30 DIAGNOSIS — E1122 Type 2 diabetes mellitus with diabetic chronic kidney disease: Secondary | ICD-10-CM

## 2021-03-30 DIAGNOSIS — E7800 Pure hypercholesterolemia, unspecified: Secondary | ICD-10-CM | POA: Diagnosis not present

## 2021-03-30 DIAGNOSIS — N1832 Chronic kidney disease, stage 3b: Secondary | ICD-10-CM

## 2021-03-30 DIAGNOSIS — E11649 Type 2 diabetes mellitus with hypoglycemia without coma: Secondary | ICD-10-CM

## 2021-03-30 DIAGNOSIS — I10 Essential (primary) hypertension: Secondary | ICD-10-CM

## 2021-03-30 DIAGNOSIS — E1165 Type 2 diabetes mellitus with hyperglycemia: Secondary | ICD-10-CM | POA: Diagnosis not present

## 2021-03-30 MED ORDER — BD PEN NEEDLE SHORT U/F 31G X 8 MM MISC: 3 refills | Status: DC

## 2021-03-30 MED ORDER — METFORMIN HCL ER 500 MG PO TB24: 1000.0000 mg | ORAL_TABLET | Freq: Two times a day (BID) | ORAL | 3 refills | Status: DC

## 2021-03-30 MED ORDER — TRESIBA FLEXTOUCH 100 UNIT/ML ~~LOC~~ SOPN: 30.0000 [IU] | PEN_INJECTOR | Freq: Every day | SUBCUTANEOUS | 0 refills | Status: DC

## 2021-03-30 MED ORDER — INSULIN LISPRO (1 UNIT DIAL) 100 UNIT/ML (KWIKPEN): 10.0000 [IU] | PEN_INJECTOR | Freq: Three times a day (TID) | SUBCUTANEOUS | 3 refills | Status: DC

## 2021-03-30 MED ORDER — OZEMPIC (1 MG/DOSE) 4 MG/3ML ~~LOC~~ SOPN: 1.0000 mg | PEN_INJECTOR | SUBCUTANEOUS | 11 refills | Status: DC

## 2021-03-30 NOTE — Patient Instructions (Addendum)
Continue Tresiba 30 units daily.  Continue Humalog 10-20 units three times daily before meals.  Continue Ozempic 0.5 mg weekly then increase to 1 mg weekly after 4 weeks total of 0.5 mg.  Continue Jardiance 25 mg daily. Continue metformin 1000 mg twice daily.    Continue all blood pressure medications.   Switch rosuvastatin and Nexlizet to morning dosing to try to be better at taking it every day.   [place]: Have your pharmacy run this as secondary insurance for your Nexlizet to bring the cost to $0.  ID: [id number] BIN: [id number] PCN: [id number] GROUP: [id number]

## 2021-03-30 NOTE — Progress Notes (Signed)
Reviewed: I agree with the documentation and management of Dr. Koval. 

## 2021-03-30 NOTE — Assessment & Plan Note (Signed)
Diabetes longstanding currently uncontrolled with most recent A1c 8.0 on 03/23/21 and with elevated home readings per CGM. Patient is able to verbalize appropriate hypoglycemia management plan. Medication adherence appears appropriate. Control is suboptimal due to dietary indiscretions and retitrating Ozempic. Will work on renewing patient assistance for her branded medications. Can consider switching Humalog to Novolog when applying for NovoNordisk patient assistance.  -Continue Tresiba (insulin degludec) 30 units daily.  -Continue Humalog (insulin lispro) 10-20 units three times daily before meals.  -Continue Ozempic (semaglutide) 0.5 mg weekly then increase to 1 mg weekly after 4 weeks total of 0.5 mg.  -Continue Jardiance (empagliflozin) 25 mg daily. -Continue metformin XR 1000 mg twice daily.   -Extensively discussed pathophysiology of diabetes, recommended lifestyle interventions, dietary effects on blood sugar control -Counseled on s/sx of and management of hypoglycemia -Next A1C anticipated 06/2021.

## 2021-03-30 NOTE — Assessment & Plan Note (Signed)
Hypertension longstanding currently controlled.  Blood pressure goal = 130/80 mmHg. Medication adherence reported.  -Continue current medications: amlodipine 10 mg daily, hydrochlorothiazide 12.5 mg daily, losartan 100 mg daily, metoprolol succinate 50 mg daily, spironolactone 25 mg daily

## 2021-03-30 NOTE — Progress Notes (Signed)
S:     Chief Complaint  Patient presents with   Medication Management    DM f/u   Patient arrives in good spirits ambulating with use of a cane. Presents for diabetes evaluation, education, and management. Patient was referred and last seen by Primary Care Provider, Dr. Andria Frames, on 03/23/21. At that time, Ozempic was resumed at 0.5 mg weekly. She had taken a month off Ozempic per GI after having otherwise unexplained nausea and a normal GI work up. She reports liking Ozempic because it helps her BG and weight but she did notice some improvement in N/V while stopping it. She is willing to titrate the dose up to 1 mg but does not want to resume 2 mg which is what she was on previously. She has now been taking 0.5 mg of Ozempic for 1 week. She is using old 1 mg pens and clicking to half the clicks it takes to get to 1 mg to equal approximately 0.5 mg (52 clicks in 1 mg, 26 clicks in 0.5 mg). She is taking Humalog after she eats rather than before.   Insurance coverage/medication affordability: She is paying $290 for Nexlizet. She currently gets [place] and Ozempic from [employer] through patient assistance [employer]) but has had issues with re-enrolling.   Medication adherence reported.   Current diabetes medications include: Humalog (insulin lispro) 10-22 units TIDAC, Tresiba (insulin degludec) 30 units daily, Jardiance (empagliflozin) 25 mg daily, metformin XR 1000 mg BID, Ozempic (semaglutide) 0.5 mg weekly Current hypertension medications include: amlodipine 10 mg daily, hydrochlorothiazide 12.5 mg daily, losartan 100 mg daily, metoprolol succinate 50 mg daily, spironolactone 25 mg daily Current hyperlipidemia medications include: rosuvastatin 20 mg daily, bempedoic acid-ezetimibe (Nexlizet) 180-10 mg daily  Patient reports hypoglycemic events. Two in the last week. Tends to be after she has given more Humalog than usual. 68 yesterday, down to nearly 40 a few days ago (believes she took  her insulin twice by accident). May treat with orange juice or pb and crackers.   Patient reported dietary habits: Eats 3 smaller meals/day. More carbs (sandwiches) than usual and more snacks lately.   Patient-reported exercise habits: no exercise, recently had injections in knees which have been helping, trying to do more walking   Patient reports nocturia (nighttime urination). 3 per night lately; used to be 1-2.  Patient reports neuropathy (nerve pain). Stable from previous.  Patient denies visual changes. Patient reports self foot exams.     O:  Physical Exam Vitals reviewed.  Constitutional:      Appearance: She is obese.  Cardiovascular:     Rate and Rhythm: Normal rate.  Pulmonary:     Effort: Pulmonary effort is normal.  Neurological:     Mental Status: She is alert.  Psychiatric:        Mood and Affect: Mood normal.        Behavior: Behavior normal.        Thought Content: Thought content normal.   Review of Systems  All other systems reviewed and are negative.  Lab Results  Component Value Date   HGBA1C 8.0 (A) 03/23/2021   Vitals:   03/30/21 1128  BP: 128/76  Pulse: 70  SpO2: 97%    Lipid Panel     Component Value Date/Time   CHOL 180 12/14/2020 1403   TRIG 110 12/14/2020 1403   HDL 60 12/14/2020 1403   CHOLHDL 3.0 12/14/2020 1403   CHOLHDL 4.1 01/20/2015 1003   VLDL 10 01/20/2015  1003   LDLCALC 100 (H) 12/14/2020 1403   LDLDIRECT 96 03/23/2021 0931   LDLDIRECT 223 (H) 10/13/2015 0932   Home BP readings: reading today - 173/107, 137/79 30 minutes later. She uses a wrist cuff and has been using it incorrectly - resting hand at side rather than across chest which could result in higher than actual readings.   Date of Download: 03/30/21 % Time CGM is active: 73% Average Glucose: 214 mg/dL Glucose Management Indicator: n/a  Glucose Variability: 27.9 (goal <36%) Time in Goal:  - Time in range 70-180: 26.2%% - Time above range: 73.6%% - Time below  range: 0.2%% Observed patterns: peaks in afternoon  Clinical Atherosclerotic Cardiovascular Disease (ASCVD): No  The 10-year ASCVD risk score (Arnett DK, et al., 2019) is: 29.7%   Values used to calculate the score:     Age: 75 years     Sex: Female     Is Non-Hispanic African American: Yes     Diabetic: Yes     Tobacco smoker: No     Systolic Blood Pressure: 832 mmHg     Is BP treated: Yes     HDL Cholesterol: 60 mg/dL     Total Cholesterol: 180 mg/dL   A/P: Diabetes longstanding currently uncontrolled with most recent A1c 8.0 on 03/23/21 and with elevated home readings per CGM. Patient is able to verbalize appropriate hypoglycemia management plan. Medication adherence appears appropriate. Control is suboptimal due to dietary indiscretions and retitrating Ozempic. We have applied for patient assistance for Mirian Mo (will switch from Humalog when approved), Ozempic, and Jardiance with the help of [name].  -Continue Tresiba (insulin degludec) 30 units daily.  -Continue Humalog (insulin lispro) 10-20 units three times daily before meals.  -Continue Ozempic (semaglutide) 0.5 mg weekly then increase to 1 mg weekly after 4 weeks total of 0.5 mg.  -Continue Jardiance (empagliflozin) 25 mg daily.  -Continue metformin XR 1000 mg twice daily.  -Extensively discussed pathophysiology of diabetes, recommended lifestyle interventions, dietary effects on blood sugar control -Counseled on s/sx of and management of hypoglycemia -Next A1C anticipated 06/2021.   ASCVD risk - primary prevention in patient with diabetes. Last LDL is not controlled. LDL goal <70 mg/dL given coronary calcifications and risk factors including DM, HTN, CKD. She reported difficulty remembering to take her antihyperlipidemic medications because she takes them at night. Cost of Nexlizet is also expensive.  -Continue rosuvastatin 20 mg daily and bempedoic acid-ezetimibe (Nexlizet) 180-10 mg daily but switch dosing to the  morning.  -Signed her up for the [employer] which she is now approved for, to bring the cost of Nexlizet from $290 to $0.   Hypertension longstanding currently controlled.  Blood pressure goal = 130/80 mmHg. Medication adherence reported.  -Continue current medications: amlodipine 10 mg daily, hydrochlorothiazide 12.5 mg daily, losartan 100 mg daily, metoprolol succinate 50 mg daily, spironolactone 25 mg daily  Written patient instructions provided. Total time in face to face counseling 60 minutes.   Follow up Pharmacist Clinic Visit in 1 month.  Patient seen with Rebbeca Paul, PharmD - PGY2 Pharmacy Resident.

## 2021-03-30 NOTE — Assessment & Plan Note (Signed)
ASCVD risk - primary prevention in patient with diabetes. Last LDL is not controlled. LDL goal <70 mg/dL given coronary calcifications and risk factors including DM, HTN, CKD. She reported difficulty remembering to take her antihyperlipidemic medications because she takes them at night. Cost of Nexlizet is also expensive.  -Continue rosuvastatin 20 mg daily and bempedoic acid-ezetimibe (Nexlizet) 180-10 mg daily but switch dosing to the morning.  -Signed her up for the [employer] which she is now approved for, to bring the cost of Nexlizet from $290 to $0.

## 2021-04-06 ENCOUNTER — Ambulatory Visit: Payer: Medicare Other | Admitting: Emergency Medicine

## 2021-04-19 NOTE — Progress Notes (Signed)
Submitted RE-ENROLLMENT application for [place] to [place] for patient assistance.   Phone: [phone]

## 2021-04-21 ENCOUNTER — Telehealth: Payer: Self-pay | Admitting: Family Medicine

## 2021-04-21 NOTE — Telephone Encounter (Signed)
**  After Hours/ Emergency Line Call**  Received a call to report that Daryll Brod has a cold and was told by her family members that she is having difficulty breathing. She does not feel as if she is having trouble breathing and she thinks they are referring to the congestion. Her other associated symptoms are sore throat. Taking OTC medications and none are working. She has tried coricidin, theraflu, and Copywriter, [employer]. She would like a suggestion to deal with the congestion. Recommended that she tried a teaspoon of honey for the throat and flonase for congestion. Also suggested if worsens follow up with UC or ED over the weekend. Encouraged calling the office Monday for an appointment if symptoms continue. Red flags discussed.  Will forward to PCP.  Gerlene Fee, DO PGY-3, [facility] 04/21/2021 10:59 PM

## 2021-04-25 NOTE — Telephone Encounter (Signed)
Called patient in follow up.  Seen at Urgent Care yesterday.  Told pneumonia and treated with levofloxacin.  (Accessed CXR in [place].) She feels better today.  She will make an appointment with me next week.

## 2021-04-28 ENCOUNTER — Ambulatory Visit: Payer: Medicare Other | Admitting: Family Medicine

## 2021-04-28 ENCOUNTER — Telehealth: Payer: Self-pay

## 2021-04-28 ENCOUNTER — Other Ambulatory Visit: Payer: Self-pay

## 2021-04-28 ENCOUNTER — Encounter: Payer: Self-pay | Admitting: Family Medicine

## 2021-04-28 VITALS — BP 155/77 | HR 86 | Ht 66.0 in | Wt 259.2 lb

## 2021-04-28 DIAGNOSIS — J189 Pneumonia, unspecified organism: Secondary | ICD-10-CM

## 2021-04-28 DIAGNOSIS — R042 Hemoptysis: Secondary | ICD-10-CM

## 2021-04-28 NOTE — Progress Notes (Signed)
° ° °  SUBJECTIVE:   CHIEF COMPLAINT / HPI: Hemoptysis  Recently seen in urgent care on 1/2, diagnosed with pneumonia and started on antibiotics.  See chart for further details.  Patient reports initial symptoms of fever, night sweats, productive cough, fatigue, nausea, shortness of breath.  Symptoms are overall improving, fever has resolved and shortness of breath has also resolved.  She is primarily concerned about hemoptysis that has occurred at night over the past 2 nights.  Sounds like possibly 1 to 2 ounces of blood in her sputum by her estimation.  She has not had night sweats prior to this current illness.  Denies leg swelling.  Used to smoke about 1 pack/day for 20 years but quit 25 years ago.  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  No further evaluation since then.  PERTINENT  PMH / PSH: HTN, T2DM, positive TB skin test, sarcoidosis, former smoker  OBJECTIVE:   BP (!) 155/77    Pulse 86    Ht 5\' 6"  (1.676 m)    Wt 259 lb 3.2 oz (117.6 kg)    SpO2 95%    BMI 41.84 kg/m   General: Alert elderly female, NAD CV: RRR, no murmurs Pulm: CTAB, no wheezes or rales, good air movement Extremities: No edema  ASSESSMENT/PLAN:   Left upper lobe pneumonia Hemoptysis Left upper lobe pneumonia demonstrated on CXR at urgent care, unable to see images.  Appears to be overall improving on antibiotics with exception of new development of hemoptysis.  I feel this is likely secondary to underlying pneumonia which should improve with continued treatment of pneumonia.  Low concern for PE, wells score 1.  Could consider further work-up for TB if not improving.  She already has follow-up with PCP next week.  She will need repeat chest x-ray in about 6 weeks.   Zola Button, MD [place]

## 2021-04-28 NOTE — Telephone Encounter (Signed)
Patient calls nurse line reporting continued cough. Patient reports she is coughing up blood all throughout the night. Patient reports a "good teaspoon size amount" each coughing "fit." Patient reports low grade fever (99.0) and night sweats. Patient reports she has 5 days left of Levofloxacin after today's dose and feels she should be feeling better, however is not. Patient reports she is most concerned about coughing up blood. Patient denies any dizziness or increased fatigue.   Patient does have an apt next week with PCP on 1/12. Patient scheduled with resident this morning for evaluation.

## 2021-04-28 NOTE — Patient Instructions (Signed)
It was nice seeing you today!  Your lungs sound good today.  Recommend using honey with or without tea for cough.  I recommend you get a repeat chest X-ray in 6-8 weeks.  Please let us know things are not getting better.  Please arrive at least 15 minutes prior to your scheduled appointments.  Stay well, Zola Button, MD [place] [phone]

## 2021-04-30 ENCOUNTER — Encounter: Payer: Self-pay | Admitting: Gastroenterology

## 2021-05-04 ENCOUNTER — Encounter: Payer: Self-pay | Admitting: Family Medicine

## 2021-05-04 ENCOUNTER — Other Ambulatory Visit: Payer: Self-pay | Admitting: Family Medicine

## 2021-05-04 ENCOUNTER — Ambulatory Visit: Payer: Medicare Other | Admitting: Pharmacist

## 2021-05-04 ENCOUNTER — Other Ambulatory Visit: Payer: Self-pay

## 2021-05-04 ENCOUNTER — Ambulatory Visit: Payer: Medicare Other | Admitting: Family Medicine

## 2021-05-04 DIAGNOSIS — N1832 Chronic kidney disease, stage 3b: Secondary | ICD-10-CM | POA: Diagnosis not present

## 2021-05-04 DIAGNOSIS — J984 Other disorders of lung: Secondary | ICD-10-CM | POA: Diagnosis not present

## 2021-05-04 DIAGNOSIS — Z794 Long term (current) use of insulin: Secondary | ICD-10-CM

## 2021-05-04 DIAGNOSIS — J189 Pneumonia, unspecified organism: Secondary | ICD-10-CM | POA: Diagnosis not present

## 2021-05-04 DIAGNOSIS — E1122 Type 2 diabetes mellitus with diabetic chronic kidney disease: Secondary | ICD-10-CM | POA: Diagnosis not present

## 2021-05-04 DIAGNOSIS — E11649 Type 2 diabetes mellitus with hypoglycemia without coma: Secondary | ICD-10-CM | POA: Diagnosis not present

## 2021-05-04 DIAGNOSIS — G4736 Sleep related hypoventilation in conditions classified elsewhere: Secondary | ICD-10-CM

## 2021-05-04 DIAGNOSIS — E1140 Type 2 diabetes mellitus with diabetic neuropathy, unspecified: Secondary | ICD-10-CM | POA: Diagnosis not present

## 2021-05-04 MED ORDER — EMPAGLIFLOZIN 25 MG PO TABS: 25.0000 mg | ORAL_TABLET | Freq: Every day | ORAL | 3 refills | Status: DC

## 2021-05-04 MED ORDER — ALBUTEROL SULFATE HFA 108 (90 BASE) MCG/ACT IN AERS: 2.0000 | INHALATION_SPRAY | Freq: Four times a day (QID) | RESPIRATORY_TRACT | 2 refills | Status: DC | PRN

## 2021-05-04 NOTE — Patient Instructions (Signed)
Please keep getting better so that I don't worry about you. Get your chest x ray in mid Feb.  I will call with results.   Call me sooner if you are getting worse.

## 2021-05-04 NOTE — Progress Notes (Signed)
S:    Chief Complaint  Patient presents with   Medication Management    Diabetes   Patient presents for diabetes evaluation, education, and management. Patient was also seen by her PCP, Dr. Andria Frames, today for evaluation of pneumonia. She reports her BG have increased since being sick which started the week of Christmas. She is starting to feel better. She increased her Tyler Aas to help control BG in the meantime.   Insurance coverage/medication affordability: She is picking up her Nexlizet today and will use the [employer] that I signed her up for last time to bring the cost to $0.   Medication adherence reported.   Current diabetes medications include: Humalog (insulin lispro) 10-20 units TIDAC (if <150, none; 150-200 10-12 units, >200 15-20 units), Tresiba (insulin degludec) 30 units daily (taking 35 units since being sick), Jardiance (empagliflozin) 25 mg daily, metformin XR 1000 mg BID, Ozempic (semaglutide) 0.5 mg weekly (was scheduled to start 1 mg today) Current hypertension medications include: amlodipine 10 mg daily, hydrochlorothiazide 12.5 mg daily, losartan 100 mg daily, metoprolol succinate 50 mg daily, spironolactone 25 mg daily Current hyperlipidemia medications include: rosuvastatin 20 mg daily, bempedoic acid-ezetimibe (Nexlizet) 180-10 mg daily  Patient reports hypoglycemic events. 3 in the past month likely due to her BG being high and overcorrecting with insulin.   O:  Physical Exam Vitals reviewed.  Neurological:     Mental Status: She is alert.  Psychiatric:        Mood and Affect: Mood normal.        Behavior: Behavior normal.        Thought Content: Thought content normal.   Review of Systems  Respiratory:  Positive for cough.   All other systems reviewed and are negative.  Lab Results  Component Value Date   HGBA1C 8.0 (A) 03/23/2021   There were no vitals filed for this visit.  Lipid Panel     Component Value Date/Time   CHOL 180 12/14/2020  1403   TRIG 110 12/14/2020 1403   HDL 60 12/14/2020 1403   CHOLHDL 3.0 12/14/2020 1403   CHOLHDL 4.1 01/20/2015 1003   VLDL 10 01/20/2015 1003   LDLCALC 100 (H) 12/14/2020 1403   LDLDIRECT 96 03/23/2021 0931   LDLDIRECT 223 (H) 10/13/2015 0932    Dexcom: before getting sick BG <200. Since being sick they have been mid 43s.   Clinical Atherosclerotic Cardiovascular Disease (ASCVD): No  The 10-year ASCVD risk score (Arnett DK, et al., 2019) is: 31%   Values used to calculate the score:     Age: 76 years     Sex: Female     Is Non-Hispanic African American: Yes     Diabetic: Yes     Tobacco smoker: No     Systolic Blood Pressure: 250 mmHg     Is BP treated: Yes     HDL Cholesterol: 60 mg/dL     Total Cholesterol: 180 mg/dL   A/P: Diabetes longstanding currently with elevated BG due to illness. Patient is able to verbalize appropriate hypoglycemia management plan. Will continue her current regimen and delay increasing Ozempic to 1 mg until next week. We will touch base with her by phone to ensure she is feeling better by then and adjust insulin if needed.  -Continue current medications: Humalog (insulin lispro) 10-20 units TIDAC, Tresiba (insulin degludec) 35 units daily, Jardiance (empagliflozin) 25 mg daily, metformin XR 1000 mg BID, Ozempic (semaglutide) 0.5 mg weekly -Extensively discussed pathophysiology of diabetes, recommended  lifestyle interventions, dietary effects on blood sugar control -Counseled on s/sx of and management of hypoglycemia -Next A1C anticipated March 2023.   Will follow up on the status of her patient assistance applications for Tresiba, [place], [place], and Ozempic at that time when she has the documents in front of her. She has received some information in the mail but doesn't remember what it said.   Written patient instructions provided. Total time in face to face counseling 20 minutes.   Follow up by pharmacist in 1 week by phone. Patient seen with  Gala Murdoch, PharmD Candidate, and Rebbeca Paul, PharmD - PGY2 Pharmacy Resident.

## 2021-05-04 NOTE — Patient Instructions (Signed)
Nice seeing you today!  Continue your current medications. I hope you start to feel better soon!  I will give you a call next Thursday 1/19 to see how you are feeling and we will decide if we will increase your Ozempic at that time.

## 2021-05-04 NOTE — Assessment & Plan Note (Signed)
Clearly needs a FU CXR which I have ordered.  She is responding to treatment as a typical bacterial pneumonia.  I will investigate if I need to [place] further for the much less likely possibility of TB pneumonia.

## 2021-05-04 NOTE — Progress Notes (Signed)
° ° °  SUBJECTIVE:   CHIEF COMPLAINT / HPI:   FU pneumonia.  First felt sick on 12/28 with fever and cough.  Went to urgent care on 1/2 and diagnosed with Left upper lobe pneumonia on CXR.  Covid neg.  Flu not tested.  Given levofloxacin.  Finished course.  Still weak and coughing.  Definitely feeling better.    Confounder is that a coworker had active TB in the late 80s, early 90s.  The health dept came in and tested everyone.  She was the only one who was TB skin test positive.  After a normal CXR, she and health department doc decided no treatment under the premise that the cure might be worse than the disease.  In 2023, my understanding is that this positive TB skin test would be treated.  Not immunosuppressed.  No recent steroids.  Lives with her sister who has had a kidney transplant and is immunosuppressed.     OBJECTIVE:   BP 132/83    Pulse 86    Ht 5\' 6"  (1.676 m)    Wt 258 lb 3.2 oz (117.1 kg)    SpO2 95%    BMI 41.67 kg/m   Lungs clear, no wheeze Cardiac 2/6 SEM  ASSESSMENT/PLAN:   Pneumonia Clearly needs a FU CXR which I have ordered.  She is responding to treatment as a typical bacterial pneumonia.  I will investigate if I need to [place] further for the much less likely possibility of TB pneumonia.       Rebecca Resides, MD [place]

## 2021-05-04 NOTE — Assessment & Plan Note (Signed)
Diabetes longstanding currently with elevated BG due to illness. Patient is able to verbalize appropriate hypoglycemia management plan. Will continue her current regimen and delay increasing Ozempic to 1 mg until next week. We will touch base with her by phone to ensure she is feeling better by then and adjust insulin if needed.  -Continue current medications: Humalog (insulin lispro) 10-20 units TIDAC, Tresiba (insulin degludec) 35 units daily, Jardiance (empagliflozin) 25 mg daily, metformin XR 1000 mg BID, Ozempic (semaglutide) 0.5 mg weekly -Extensively discussed pathophysiology of diabetes, recommended lifestyle interventions, dietary effects on blood sugar control -Counseled on s/sx of and management of hypoglycemia -Next A1C anticipated March 2023.   Will follow up on the status of her patient assistance applications for Tresiba, [place], [place], and Ozempic at that time when she has the documents in front of her. She has received some information in the mail but doesn't remember what it said.

## 2021-05-04 NOTE — Progress Notes (Signed)
Noted and agree. 

## 2021-05-10 NOTE — Progress Notes (Signed)
Received notification from [place] regarding approval for TRESIBA, NOVOLOG FLEXPEN, & OZEMPIC. Patient assistance approved until 03/22/22.  [place] [facility] ARE [facility] PROCESSING CENTER  Phone: [phone]

## 2021-05-11 ENCOUNTER — Telehealth (INDEPENDENT_AMBULATORY_CARE_PROVIDER_SITE_OTHER): Payer: Medicare Other | Admitting: Pharmacist

## 2021-05-11 DIAGNOSIS — N1832 Chronic kidney disease, stage 3b: Secondary | ICD-10-CM

## 2021-05-11 DIAGNOSIS — E1122 Type 2 diabetes mellitus with diabetic chronic kidney disease: Secondary | ICD-10-CM | POA: Diagnosis not present

## 2021-05-11 NOTE — Patient Instructions (Signed)
°-  Increase Ozempic (semaglutide) gradually to 1 mg. Instructed patient to dial up clicks halfway between 0.5 and 1 mg, then increase by 2-3 clicks each week until she is at 1 mg.  -Decrease Tresiba (insulin degludec) to 24 units daily

## 2021-05-11 NOTE — Assessment & Plan Note (Signed)
Diabetes longstanding currently with BG improving as she continues to recover from pnuemonia. She is back down to using [place] 30 units (had been using 35 units while sick). Patient is able to verbalize appropriate hypoglycemia management plan. Medication adherence appears appropriate. She feels like she is doing well enough to start increasing Ozempic to a max dose of 1 mg which we will do slowly given her hx with GI upset on Ozempic. As we increase Ozempic, will also decrease insulin to prevent hypoglycemia given BG are normalizing.  -Increase Ozempic (semaglutide) gradually to 1 mg. Instructed patient to dial up clicks halfway between 0.5 and 1 mg, then increase by 2-3 clicks each week until she is at 1 mg.  -Decrease Tresiba (insulin degludec) to 24 units daily

## 2021-05-11 NOTE — Progress Notes (Signed)
S:     Chief Complaint  Patient presents with   Medication Management    Diabetes - F/U Med Adjust   Patient visit conducted by phone due to recent illness and patient preference.  Visit for diabetes evaluation, education, and management. Patient was referred and last seen by Primary Care Provider, Dr. Andria Frames, on 05/04/2021.  Patient reports her blood sugars are improved, blood pressure remains mildly elevated and her pneumonia is gradually improving.  She reports that she just recently got out of the house to perform some errands.   Insurance coverage/medication affordability: Continues to work on Copy for medication.  Reported recently approved by [employer] for coverage 2023 - Tresiba, Ozempic and Novolog She needs to drop off paperwork with [name] for [employer] which she will do today, but she just filled a 90 DS at her pharmacy so she will not run out in the meantime.  She got an approval in the mail for [employer] to cover the cost of her Nexlizet.   Medication adherence reported good - adequate supply at this time.   Jardiance was filled yesterday.  Current diabetes medications include: Tresiba (insulin degludec), Ozempic (semaglutide), Novolog (insulin aspart), Jardiance (empagliflozin), and metformin Current hypertension medications include: amlodipine, hydrochlorothiazide, losartan, metoprolol succinate, spironolactone Current hyperlipidemia medications include: rosuvastatin, Nexlizet  Patient denies hypoglycemic events.   O:  Physical Exam - unable to assess due to telephone visit  Review of Systems  Respiratory:  Positive for cough and sputum production. Negative for shortness of breath.   Neurological:  Positive for headaches.   Lab Results  Component Value Date   HGBA1C 8.0 (A) 03/23/2021   Lipid Panel     Component Value Date/Time   CHOL 180 12/14/2020 1403   TRIG 110 12/14/2020 1403   HDL 60 12/14/2020 1403   CHOLHDL 3.0 12/14/2020 1403    CHOLHDL 4.1 01/20/2015 1003   VLDL 10 01/20/2015 1003   LDLCALC 100 (H) 12/14/2020 1403   LDLDIRECT 96 03/23/2021 0931   LDLDIRECT 223 (H) 10/13/2015 0932    Home fasting blood sugars: averaging 175-200 on Dexcom. Fasting AM BG 112-130s.  Clinical Atherosclerotic Cardiovascular Disease (ASCVD): No  The 10-year ASCVD risk score (Arnett DK, et al., 2019) is: 31%   Values used to calculate the score:     Age: 3 years     Sex: Female     Is Non-Hispanic African American: Yes     Diabetic: Yes     Tobacco smoker: No     Systolic Blood Pressure: 742 mmHg     Is BP treated: Yes     HDL Cholesterol: 60 mg/dL     Total Cholesterol: 180 mg/dL   A/P: Diabetes longstanding currently with BG improving as she continues to recover from pnuemonia. She is back down to using [place] 30 units (had been using 35 units while sick). Patient is able to verbalize appropriate hypoglycemia management plan. Medication adherence appears appropriate. She feels like she is doing well enough to start increasing Ozempic to a max dose of 1 mg which we will do slowly given her hx with GI upset on Ozempic. As we increase Ozempic, will also decrease insulin to prevent hypoglycemia given BG are normalizing.  -Increase Ozempic (semaglutide) gradually to 1 mg. Instructed patient to dial up clicks halfway between 0.5 and 1 mg, then increase by 2-3 clicks each week until she is at 1 mg.  -Decrease Tresiba (insulin degludec) to 24 units daily -Continue all other medications  at current doses -Extensively discussed pathophysiology of diabetes, recommended lifestyle interventions, dietary effects on blood sugar control -Counseled on s/sx of and management of hypoglycemia  Hypertension longstanding currently fluctuating at home likely due to illness per patient report.  Blood pressure goal = <130/80 mmHg. Medication adherence reported.  -Continue current medications -Patient instructed to continue monitoring BP and let us know  if BP becomes too low or too high, or if she is having symptoms like dizziness or headache  Verbal patient instructions provided.  Total time in telephonic counseling 20 minutes.   Follow up Pharmacist follow up by phone in 2 weeks.   Patient seen with Gala Murdoch, PharmD Candidate.

## 2021-05-12 NOTE — Progress Notes (Signed)
Reviewed: I agree with Dr. Koval's documentation and management. 

## 2021-05-18 ENCOUNTER — Other Ambulatory Visit (HOSPITAL_COMMUNITY): Payer: Self-pay

## 2021-05-23 ENCOUNTER — Telehealth: Payer: Self-pay

## 2021-05-23 ENCOUNTER — Other Ambulatory Visit (HOSPITAL_COMMUNITY): Payer: Self-pay

## 2021-05-23 NOTE — Telephone Encounter (Signed)
Informed pt that one box of ozempic is ready for pickup

## 2021-05-25 ENCOUNTER — Other Ambulatory Visit: Payer: Self-pay

## 2021-05-25 ENCOUNTER — Encounter: Payer: Self-pay | Admitting: Pharmacist

## 2021-05-25 ENCOUNTER — Telehealth (INDEPENDENT_AMBULATORY_CARE_PROVIDER_SITE_OTHER): Payer: Medicare Other | Admitting: Pharmacist

## 2021-05-25 DIAGNOSIS — E1165 Type 2 diabetes mellitus with hyperglycemia: Secondary | ICD-10-CM

## 2021-05-25 DIAGNOSIS — I10 Essential (primary) hypertension: Secondary | ICD-10-CM

## 2021-05-25 DIAGNOSIS — N1832 Chronic kidney disease, stage 3b: Secondary | ICD-10-CM

## 2021-05-25 DIAGNOSIS — E11649 Type 2 diabetes mellitus with hypoglycemia without coma: Secondary | ICD-10-CM | POA: Diagnosis not present

## 2021-05-25 DIAGNOSIS — E1122 Type 2 diabetes mellitus with diabetic chronic kidney disease: Secondary | ICD-10-CM | POA: Diagnosis not present

## 2021-05-25 MED ORDER — TRESIBA FLEXTOUCH 100 UNIT/ML ~~LOC~~ SOPN: 25.0000 [IU] | PEN_INJECTOR | Freq: Every day | SUBCUTANEOUS | Status: DC

## 2021-05-25 NOTE — Assessment & Plan Note (Signed)
Hypertension longstanding currently improved since last phone visit when she reported fluctuating BP.  Blood pressure goal = <130/80 mmHg. Medication adherence is optimal. Log of recent home readings are at goal.  -Continue hydrochlorothiazide 12.5 mg daily, amlodipine 10 mg daily, losartan 100 mg daily, and metoprolol succinate 50 mg daily.  -Continue checking BP daily and bring log of readings to next visit.

## 2021-05-25 NOTE — Progress Notes (Signed)
Noted and agree. 

## 2021-05-25 NOTE — Progress Notes (Signed)
Received notification from [employer]) regarding patient assistance DENIAL for [employer].   PT EXCEEDS PROGRAM INCOME  [phone]

## 2021-05-25 NOTE — Patient Instructions (Signed)
Nice to talk to you today.   Glad to hear that you are feeling better.

## 2021-05-25 NOTE — Assessment & Plan Note (Signed)
Diabetes longstanding currently with BG improving as she continues to recover from her recent illness. She is tolerating Ozempic 0.75 mg well. Patient is able to verbalize appropriate hypoglycemia management plan. Medication adherence is optimal.  -Increase Ozempic to 1 mg weekly. If she starts to have any hypoglycemia, decrease Tresiba by 1-2 units.  -Continue metformin XR 1000 mg BID, Jardiance 25 mg daily, Humalog 10-20 units before meals (skip if pre-meal BG <150 mg/dL), and Tresiba 25 units daily.  -Extensively discussed pathophysiology of diabetes, recommended lifestyle interventions, dietary effects on blood sugar control -Counseled on s/sx of and management of hypoglycemia -Next A1C anticipated March 2023.

## 2021-05-25 NOTE — Progress Notes (Signed)
S:  Phone - telehealth visit.  Patient at home.  Conducted from [facility] office  Chief Complaint  Patient presents with   Medication Management    Diabetes Follow-up   Visit for diabetes evaluation, education, and management Patient follow-up of last Rx Clinic visit following Primary Care Provider, Dr. Andria Frames, on 05/04/2021. Rx Clinic last followed up with patient by phone on 05/11/21.   Patient reports diabetes control is much improved. She also reports feeling much better, although she continues to feel fatigued. Her breathing has improved and she is only coughing now at night. She dropped off a log of BP and Dexcom readings for [place] to review today. She has only been using Humalog before her dinner now. If her BG is <150 before breakfast and lunch she skips the Humalog dose. She took 0.75 mg of Ozempic last week and had no nausea. She asks if she can go ahead and increase to 1 mg today.   Medication adherence reported excellent.   Current diabetes medications include: Metformin, Ozempic 0.75mg , Jardiance, Tresiba and Humalog (transitioning to Novolog for patient assistance purposes). Current hypertension medications include: Amlodipine 10mg , HCTZ 12.5mg , Metoprolol-XL 50mg , Spironolactone 25mg  Current hyperlipidemia medications include: Rosuvastatin 20mg ,  Ezetimibe 10mg  + Bempedoic Acid 180mg  (Nexlizet)  Insurance coverage/medication affordability: She is approved for [employer] patient assistance for 2023 which she will receive [place], Novolog, and Ozempic through. Jardiance patient assistance still pending but she recently filled a 90 DS so she will not run out in the interim. She successfully used her [place] to bring cost of Nexlizet to $0.   Patient denies hypoglycemic events.  Patient reported dietary habits: Appetite is returning to normal. Reports she could be improving her diet. She has had more sugary drinks like juices lately as she has recovered from her cold.    O:   Physical Exam - unable to be assessed due to telephone visit  ROS - coughing at night. Denies dizziness, lightheadedness, SOB, problems breathing.   Lab Results  Component Value Date   HGBA1C 8.0 (A) 03/23/2021   Lipid Panel     Component Value Date/Time   CHOL 180 12/14/2020 1403   TRIG 110 12/14/2020 1403   HDL 60 12/14/2020 1403   CHOLHDL 3.0 12/14/2020 1403   CHOLHDL 4.1 01/20/2015 1003   VLDL 10 01/20/2015 1003   LDLCALC 100 (H) 12/14/2020 1403   LDLDIRECT 96 03/23/2021 0931   LDLDIRECT 223 (H) 10/13/2015 0932   Date of Dexcom Download: 05/25/21 % Time CGM is active: 67% Average Glucose: 194 mg/dL Glucose Management Indicator: 8.0  Time in Goal:  - Time in range 70-180: 42% - Time above range: 57% - Time below range: <1%  Home BP readings from the past week: 111/66, 112/63, 105/59, 126/78, 110/72, 128/78, 133/87  Clinical Atherosclerotic Cardiovascular Disease (ASCVD): No  The 10-year ASCVD risk score (Arnett DK, et al., 2019) is: 31%   Values used to calculate the score:     Age: 76 years     Sex: Female     Is Non-Hispanic African American: Yes     Diabetic: Yes     Tobacco smoker: No     Systolic Blood Pressure: 865 mmHg     Is BP treated: Yes     HDL Cholesterol: 60 mg/dL     Total Cholesterol: 180 mg/dL   A/P: Diabetes longstanding currently with BG improving as she continues to recover from her recent illness. She is tolerating Ozempic 0.75 mg well.  Patient is able to verbalize appropriate hypoglycemia management plan. Medication adherence is optimal.  -Increase Ozempic to 1 mg weekly. If she starts to have any hypoglycemia, decrease Tresiba by 1-2 units.  -Continue metformin XR 1000 mg BID, Jardiance 25 mg daily, Humalog 10-20 units before meals (skip if pre-meal BG <150 mg/dL), and Tresiba 25 units daily.  -Extensively discussed pathophysiology of diabetes, recommended lifestyle interventions, dietary effects on blood sugar control -Counseled on s/sx of  and management of hypoglycemia -Next A1C anticipated March 2023.   ASCVD risk - She was able to successfully use her [place] to bring cost of Nexlizet to $0.  -Continue rosuvastatin 20 mg daily and bempedoic acid-ezetimibe (Nexlizet) 180-10 mg daily   Hypertension longstanding currently improved since last phone visit when she reported fluctuating BP.  Blood pressure goal = <130/80 mmHg. Medication adherence is optimal. Log of recent home readings are at goal.  -Continue hydrochlorothiazide 12.5 mg daily, amlodipine 10 mg daily, losartan 100 mg daily, and metoprolol succinate 50 mg daily.  -Continue checking BP daily and bring log of readings to next visit.   She asked if she should get her shingles vaccine soon as all lesions and neuropathy have resolved from her last shingles infection. Recommended waiting until she feels back to normal in terms of fatigue since that vaccine in particular can cause fatigue. She will plan to schedule an appt for the vaccine series once she is feeling better.   Verbal patient instructions provided. Total time in telephone counseling 18 minutes.    Follow up Pharmacist Clinic Visit in 3 weeks.  Patient seen with Rebbeca Paul, PharmD - PGY2 Pharmacy Resident.

## 2021-05-30 ENCOUNTER — Telehealth: Payer: Self-pay

## 2021-05-30 NOTE — Telephone Encounter (Signed)
Left voicemail regarding med pickup (ozempic). Call back [phone].   4 boxes of 4mg  pens are ready & labeled in med room fridge (2 month supply). Pt will need to inject twice to get 2mg  weekly dose. Still waiting on dose increase order (8mg  pen).

## 2021-06-05 ENCOUNTER — Telehealth: Payer: Self-pay

## 2021-06-05 DIAGNOSIS — E1122 Type 2 diabetes mellitus with diabetic chronic kidney disease: Secondary | ICD-10-CM

## 2021-06-05 MED ORDER — NOVOLOG FLEXPEN 100 UNIT/ML ~~LOC~~ SOPN: PEN_INJECTOR | SUBCUTANEOUS | 11 refills | Status: DC

## 2021-06-05 NOTE — Telephone Encounter (Signed)
Pt calling to f/u on novolog flexpen shipment and inform me that shes going to continue to take 1mg  weekly of ozempic.   Informed pt that novo nordisk shipments are about 6 weeks out at the moment. Pt is almost out of humalog and is aware she will be starting novolog once shipment comes in. She would like to request a rx of the novolog flexpen be sent to her pharmacy ([facility] on [address]) in the meantime. Told pt I would forward to her pcp.

## 2021-06-05 NOTE — Addendum Note (Signed)
Addended by: Zenia Resides on: 06/05/2021 09:22 AM   Modules accepted: Orders

## 2021-06-05 NOTE — Telephone Encounter (Signed)
Rx sent as requested.  Patient notified.

## 2021-06-06 ENCOUNTER — Ambulatory Visit
Admission: RE | Admit: 2021-06-06 | Discharge: 2021-06-06 | Disposition: A | Discharge status: Home / Self Care | Payer: Medicare Other | Source: Ambulatory Visit | Attending: Family Medicine | Admitting: Family Medicine

## 2021-06-06 ENCOUNTER — Other Ambulatory Visit: Payer: Self-pay

## 2021-06-06 DIAGNOSIS — J189 Pneumonia, unspecified organism: Secondary | ICD-10-CM

## 2021-06-12 NOTE — Telephone Encounter (Signed)
Patient's nephew, Merlene Laughter, presents to front office to pick up Ozempic and Novolog. Called patient and verified that nephew could pick up medication. Provided with samples per previous notes.   Talbot Grumbling, RN

## 2021-06-19 ENCOUNTER — Encounter: Payer: Self-pay | Admitting: Pharmacist

## 2021-06-19 ENCOUNTER — Encounter: Payer: Self-pay | Admitting: Family Medicine

## 2021-06-19 ENCOUNTER — Ambulatory Visit: Payer: Medicare Other | Admitting: Family Medicine

## 2021-06-19 ENCOUNTER — Ambulatory Visit: Payer: Medicare Other | Admitting: Pharmacist

## 2021-06-19 ENCOUNTER — Other Ambulatory Visit: Payer: Self-pay

## 2021-06-19 DIAGNOSIS — Z9289 Personal history of other medical treatment: Secondary | ICD-10-CM | POA: Diagnosis not present

## 2021-06-19 DIAGNOSIS — E1122 Type 2 diabetes mellitus with diabetic chronic kidney disease: Secondary | ICD-10-CM

## 2021-06-19 DIAGNOSIS — I10 Essential (primary) hypertension: Secondary | ICD-10-CM | POA: Diagnosis not present

## 2021-06-19 DIAGNOSIS — N1832 Chronic kidney disease, stage 3b: Secondary | ICD-10-CM

## 2021-06-19 NOTE — Assessment & Plan Note (Signed)
Diabetes longstanding currently decently controlled. Patient is  able to verbalize appropriate hypoglycemia management plan. Medication adherence reported as optimal. Control is suboptimal due to lack of medication optimization. -Continued basal insulin Tresiba (insulin degludec) 24 units. -Continued rapid insulin Novolog (insulin aspart) 10-15 units TID. -Continued GLP-1 Ozempic (generic name semaglutide) 4 clicks below 1mg  weekly.  -Continued SGLT2-I Jardiance (generic name empagliflozin) 25mg . Counseled on sick day rules. -Continued metformin 500mg  TID before meals.  -Patient educated on purpose, proper use, and potential adverse effects.  -Extensively discussed pathophysiology of diabetes, recommended lifestyle interventions, dietary effects on blood sugar control.  -Counseled on s/sx of and management of hypoglycemia.  -Next A1c anticipated March 2023.

## 2021-06-19 NOTE — Assessment & Plan Note (Signed)
Hypertension longstanding currently uncontrolled. Blood pressure goal of <130/80 mmHg. Medication adherence reported as optimal. Blood pressure control is suboptimal due to lack of medication optimization. -Continued amlodipine 10mg  daily -Continued furosemide 20mg  PRN -Continued HCTZ 12.5mg  daily -Continued losartan 100mg  daily -Continued metoprolol succinate 50mg  daily -Continued spironolactone 25mg  daily

## 2021-06-19 NOTE — Progress Notes (Signed)
S:     Chief Complaint  Patient presents with   Medication Management    Diabetes    Rebecca Hicks is a 76 y.o. female who presents for diabetes evaluation, education, and management. PMH is significant for pneumonia. Patient was last seen by Primary Care Provider, Dr. Andria Frames, on 06/19/2021.   Today, She arrives in good spirits and presents with assistance of a cane. Patient was recently ill with pneumonia, but has since recovered. Patient also discussed difficulty applying Dexcom sensor without assistance.  Current diabetes medications include: jardiance 25mg  daily, Insulin aspart 10-15 units TID with meals, insulin degludec 24 units daily, metformin XR 500mg  TID before meals, Semaglutide (ozempic) 4 clicks below 1mg  Current hypertension medications include: amlodipine 10mg  daily, furosemide 20mg  PRN, HCTZ 12.5mg  daily, losartan 100mg  daily, metoprolol succinate 50mg  daily, spironolactone 25mg  daily Current hyperlipidemia medications include: bempedoic acid-ezetimibe 180/10mg  daily, rosuvastatin 20mg  daily  Patient states that She is taking her medications as prescribed. Patient reports adherence with medications.   Do you feel that your medications are working for you? yes Insurance coverage: [employer]  Patient reports hypoglycemic events. She had one blood sugar in the 40s, where she did not have hypoglycemic symptoms.  CGM results  Goal: 45% High: 45% Very high: 9% GMI: 7.8 Variation: 25%  Patient denies neuropathy (nerve pain). Patient denies visual changes.  Patient reported dietary habits: Eats 3 meals/day. Patient eats dinner earlier and then again later in the evening.  Patient states she currently is working on 3 health related goals.  - Getting off of one blood pressure medication - Improving blood sugar to allow her to stop metformin - Losing weight   O:  Physical Exam Constitutional:      Appearance: She is obese.  Neurological:     Mental  Status: She is alert.    Review of Systems  All other systems reviewed and are negative.  Lab Results  Component Value Date   HGBA1C 8.0 (A) 03/23/2021   Vitals:   06/19/21 1235  BP: 140/82    Lipid Panel     Component Value Date/Time   CHOL 180 12/14/2020 1403   TRIG 110 12/14/2020 1403   HDL 60 12/14/2020 1403   CHOLHDL 3.0 12/14/2020 1403   CHOLHDL 4.1 01/20/2015 1003   VLDL 10 01/20/2015 1003   LDLCALC 100 (H) 12/14/2020 1403   LDLDIRECT 96 03/23/2021 0931   LDLDIRECT 223 (H) 10/13/2015 0932    Clinical Atherosclerotic Cardiovascular Disease (ASCVD): Yes  The 10-year ASCVD risk score (Arnett DK, et al., 2019) is: 33.5%   Values used to calculate the score:     Age: 35 years     Sex: Female     Is Non-Hispanic African American: Yes     Diabetic: Yes     Tobacco smoker: No     Systolic Blood Pressure: 175 mmHg     Is BP treated: Yes     HDL Cholesterol: 60 mg/dL     Total Cholesterol: 180 mg/dL    A/P: Diabetes longstanding currently decently controlled. Patient is  able to verbalize appropriate hypoglycemia management plan. Medication adherence reported as optimal. Control is suboptimal due to lack of medication optimization. -Continued basal insulin Tresiba (insulin degludec) 24 units. -Continued rapid insulin Novolog (insulin aspart) 10-15 units TID. -Continued GLP-1 Ozempic (generic name semaglutide) 4 clicks below 1mg  weekly.  -Continued SGLT2-I Jardiance (generic name empagliflozin) 25mg . Counseled on sick day rules. -Continued metformin 500mg  TID before meals.  -  Patient educated on purpose, proper use, and potential adverse effects.  -Extensively discussed pathophysiology of diabetes, recommended lifestyle interventions, dietary effects on blood sugar control.  -Counseled on s/sx of and management of hypoglycemia.  -Next A1c anticipated March 2023.   ASCVD risk - primary prevention in patient with diabetes. Last LDL is 96mg /dL not at goal of <70 mg/dL.  ASCVD risk factors include DM and 10-year ASCVD risk score of 36%. high intensity statin indicated.  -Continued rosuvastatin 20 mg.  -Continued bempedoic acid-ezetimibe 180/10mg  daily  Hypertension longstanding currently uncontrolled. Blood pressure goal of <130/80 mmHg. Medication adherence reported as optimal. Blood pressure control is suboptimal due to lack of medication optimization. -Continued amlodipine 10mg  daily -Continued furosemide 20mg  PRN -Continued HCTZ 12.5mg  daily -Continued losartan 100mg  daily -Continued metoprolol succinate 50mg  daily -Continued spironolactone 25mg  daily  Written patient instructions provided. Patient verbalized understanding of treatment plan. Total time in face to face counseling 20 minutes.    Follow up pharmacy clinic visit in 3 weeks. Patient seen with Gala Murdoch, PharmD Candidate. Marland Kitchen

## 2021-06-19 NOTE — Patient Instructions (Signed)
You strike me as thinking very clearly about a lot of things. No change in your blood pressure medication.  You are over the pneumonia We can chat again about possible latent TB treatment in 3-6 months. Keep your laser focus on the diabetes.

## 2021-06-19 NOTE — Patient Instructions (Addendum)
Nice to see you today.   No changes.

## 2021-06-20 ENCOUNTER — Encounter: Payer: Self-pay | Admitting: Family Medicine

## 2021-06-20 DIAGNOSIS — Z9289 Personal history of other medical treatment: Secondary | ICD-10-CM | POA: Insufficient documentation

## 2021-06-20 NOTE — Progress Notes (Signed)
° ° °  SUBJECTIVE:   CHIEF COMPLAINT / HPI:   Multiple issues: HBP: BP elevated in the office but this reliable patient brings a log showing consistently normal BP readings at home.  No CP or SOB FU upper lobe pneumonia.  Repeat CXR cleared and patient is asymptomatic Remote hx of TB +.  Never treated for latent TB.  We discussed that more patients are now treated for latent TB and that I am not an expert.  Offered referral to ID or the health department.  She does not want to pursue at this time Back on ozempic and has a nice 4lb wt loss.  Due for an A1C next visit. She is "laser focused" on getting her DM under control.      OBJECTIVE:   BP (!) 151/75    Pulse 82    Ht 5\' 6"  (1.676 m)    Wt 254 lb 9.6 oz (115.5 kg)    SpO2 98%    BMI 41.09 kg/m   Lungs clear.   Cardiac RRR without m or g  ASSESSMENT/PLAN:   Essential hypertension Good control based on multiple home readings.  No change in therapy.  DM (diabetes mellitus), type 2 with renal complications ([place]) Improving control with excellent patient motivation.  History of positive PPD Recent CXR shows clearing of infiltrate.  No evidence of active TB.  Patient does not want to pursue the possibility of treating latent TB at this time.     Zenia Resides, MD [place]

## 2021-06-20 NOTE — Assessment & Plan Note (Signed)
Improving control with excellent patient motivation.

## 2021-06-20 NOTE — Assessment & Plan Note (Signed)
Good control based on multiple home readings.  No change in therapy.

## 2021-06-20 NOTE — Assessment & Plan Note (Signed)
Recent CXR shows clearing of infiltrate.  No evidence of active TB.  Patient does not want to pursue the possibility of treating latent TB at this time.

## 2021-06-20 NOTE — Progress Notes (Signed)
Reveiwed: I agree with Dr. Koval's documentation and management. 

## 2021-06-26 ENCOUNTER — Encounter: Payer: Self-pay | Admitting: Gastroenterology

## 2021-06-27 ENCOUNTER — Telehealth: Payer: Self-pay

## 2021-06-27 NOTE — Telephone Encounter (Signed)
Informed pt that novo nordisk medication (generic tresiba) is ready for pickup. Pt can come today or tomorrow to pickup. ? ?3 boxes labeled and ready in med room fridge. ?

## 2021-07-21 ENCOUNTER — Other Ambulatory Visit: Payer: Self-pay | Admitting: Cardiovascular Disease

## 2021-07-23 ENCOUNTER — Other Ambulatory Visit: Payer: Self-pay | Admitting: Cardiovascular Disease

## 2021-07-23 DIAGNOSIS — E785 Hyperlipidemia, unspecified: Secondary | ICD-10-CM

## 2021-07-25 ENCOUNTER — Ambulatory Visit (AMBULATORY_SURGERY_CENTER): Payer: Medicare Other | Admitting: *Deleted

## 2021-07-25 ENCOUNTER — Other Ambulatory Visit: Payer: Self-pay

## 2021-07-25 VITALS — Ht 66.0 in | Wt 240.0 lb

## 2021-07-25 DIAGNOSIS — Z86010 Personal history of colonic polyps: Secondary | ICD-10-CM

## 2021-07-25 MED ORDER — PEG 3350-KCL-NA BICARB-NACL 420 G PO SOLR: 4000.0000 mL | Freq: Once | ORAL | 0 refills | Status: AC

## 2021-07-25 NOTE — Progress Notes (Signed)

## 2021-08-07 ENCOUNTER — Encounter: Payer: Self-pay | Admitting: Internal Medicine

## 2021-08-15 ENCOUNTER — Encounter: Payer: Self-pay | Admitting: Gastroenterology

## 2021-08-15 ENCOUNTER — Ambulatory Visit (AMBULATORY_SURGERY_CENTER): Payer: Medicare Other | Admitting: Gastroenterology

## 2021-08-15 VITALS — BP 114/73 | HR 70 | Temp 98.9°F | Resp 15 | Ht 66.0 in | Wt 240.0 lb

## 2021-08-15 DIAGNOSIS — Z86010 Personal history of colonic polyps: Secondary | ICD-10-CM | POA: Diagnosis not present

## 2021-08-15 MED ORDER — SODIUM CHLORIDE 0.9 % IV SOLN: 500.0000 mL | INTRAVENOUS | Status: DC

## 2021-08-15 NOTE — Patient Instructions (Signed)
No polyps!!  Next colonoscopy- 5 years! ?Please read over handouts about diverticulosis and high fiber diets ? ?Continue your normal medications ? ? ?YOU HAD AN ENDOSCOPIC PROCEDURE TODAY AT [facility]:   Refer to the procedure report that was given to you for any specific questions about what was found during the examination.  If the procedure report does not answer your questions, please call your gastroenterologist to clarify.  If you requested that your care partner not be given the details of your procedure findings, then the procedure report has been included in a sealed envelope for you to review at your convenience later. ? ?YOU SHOULD EXPECT: Some feelings of bloating in the abdomen. Passage of more gas than usual.  Walking can help get rid of the air that was put into your GI tract during the procedure and reduce the bloating. If you had a lower endoscopy (such as a colonoscopy or flexible sigmoidoscopy) you may notice spotting of blood in your stool or on the toilet paper. If you underwent a bowel prep for your procedure, you may not have a normal bowel movement for a few days. ? ?Please Note:  You might notice some irritation and congestion in your nose or some drainage.  This is from the oxygen used during your procedure.  There is no need for concern and it should clear up in a day or so. ? ?SYMPTOMS TO REPORT IMMEDIATELY: ? ?Following lower endoscopy (colonoscopy or flexible sigmoidoscopy): ? Excessive amounts of blood in the stool ? Significant tenderness or worsening of abdominal pains ? Swelling of the abdomen that is new, acute ? Fever of 100?F or higher ? ?For urgent or emergent issues, a gastroenterologist can be reached at any hour by calling [phone]. ?Do not use MyChart messaging for urgent concerns.  ? ? ?DIET:  We do recommend a small meal at first, but then you may proceed to your regular diet.  Drink plenty of fluids but you should avoid alcoholic beverages for 24  hours. ? ?ACTIVITY:  You should plan to take it easy for the rest of today and you should NOT DRIVE or use heavy machinery until tomorrow (because of the sedation medicines used during the test).   ? ?FOLLOW UP: ?Our staff will call the number listed on your records 48-72 hours following your procedure to check on you and address any questions or concerns that you may have regarding the information given to you following your procedure. If we do not reach you, we will leave a message.  We will attempt to reach you two times.  During this call, we will ask if you have developed any symptoms of COVID 19. If you develop any symptoms (ie: fever, flu-like symptoms, shortness of breath, cough etc.) before then, please call [phone].  If you test positive for Covid 19 in the 2 weeks post procedure, please call and report this information to [place].   ? ? ?SIGNATURES/CONFIDENTIALITY: ?You and/or your care partner have signed paperwork which will be entered into your electronic medical record.  These signatures attest to the fact that that the information above on your After Visit Summary has been reviewed and is understood.  Full responsibility of the confidentiality of this discharge information lies with you and/or your care-partner. ? ?

## 2021-08-15 NOTE — Progress Notes (Signed)
HPI: ?This is a woman with h/o polyps ? ?Colonoscopy 2016 Dr. Ardis Hughs 7 subCM adenomas.  Colonoscopy 04/2021 Dr. Ardis Hughs 3 subCM adenomas. ? ? ?ROS: complete GI ROS as described in HPI, all other review negative. ? ?Constitutional:  No unintentional weight loss ? ? ?Past Medical History:  ?Diagnosis Date  ? Abdominal pain 12/05/2011  ? Abdominal pain, left upper quadrant 12/03/2012  ? Acute on chronic renal failure ([place]) 12/25/2012  ? Allergy   ? SEASONAL  ? Arthritis   ? Arthropathy 11/24/2016  ? cervical spine  ? Ataxia 12/15/2015  ? Atypical chest pain 03/28/2016  ? Cataract   ? BILATERAL-REMOVED  ? CERUMEN IMPACTION, RIGHT 01/31/2010  ? Qualifier: Diagnosis of  By: Dianah Field MD, Marcello Moores    ? Chest tightness 02/23/2014  ? Chronic kidney disease (CKD), stage II (mild) 03/05/2013  ? Looks like baseline creat = 1.2 -1.3.  Watch for overdiuresis.   ? COLONIC POLYPS, ADENOMATOUS 05/03/2009  ? Every five year colonoscopy due to adenomatous polyp found 04/2009    ? DDD (degenerative disc disease), cervical 11/24/2016  ? Depression   ? "HISTORY OF IT,BUT OK NOW"  ? Diabetes mellitus   ? Diabetic neuropathy, type II diabetes mellitus ([place]) 05/28/2008  ? Qualifier: Diagnosis of  By: Andria Frames MD, Gwyndolyn Saxon    ? Diarrhea 01/21/2015  ? DM (diabetes mellitus), type 2, uncontrolled 02/07/2007  ? Qualifier: Diagnosis of  By: Wynetta Emery RN, Doroteo Bradford    ? 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 Essential hypertension 02/07/2007  ? Qualifier: Diagnosis of  By: Wynetta Emery RN, Doroteo Bradford    ? Excessive sleepiness 04/07/2014  ? Fatigue 05/04/2011  ? GERD (gastroesophageal reflux disease)   ? Glaucoma   ? GLAUCOMA NOS 02/25/2009  ? Qualifier: Diagnosis of  By: Andria Frames MD, Gwyndolyn Saxon    ? Great toe pain 09/18/2012  ? History of nuclear stress test   ? Myoview 11/17: EF 64, Normal pharmacologic nuclear stress test with no evidence of prior infarct or ischemia.  ? Hoarseness 10/31/2016  ? Hyperlipidemia   ? Hypertension   ? LOW BACK PAIN SYNDROME 05/28/2008  ? Qualifier:  Diagnosis of  By: Andria Frames MD, Gwyndolyn Saxon    ? Morbid obesity [facility])   ? Neck pain 11/24/2016  ? Neuromuscular disorder ([place])   ? Obesity hypoventilation syndrome ([place]) 11/10/2015  ? Obstructive sleep apnea 11/17/2015  ? Otalgia of right ear 11/24/2016  ? Polyneuropathy in diabetes(357.2)   ? Sarcoidosis   ? Sinusitis acute 10/03/2010  ? Sleep apnea   ? uses cpap  ? 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 Sleep study results from 08/02/16 Trial of CPAP therapy on 10 cm H2O and 3 liters oxygen. - She was fitted with a Small size Fisher[employer]kel Full Face Mask Simplus mask and heated humidification.    ? Snoring 09/05/2015  ? TB SKIN TEST, POSITIVE 02/07/2007  ? Annotation: with clear CXR mid 1990's Qualifier: History of  By: Milana Obey, Doroteo Bradford    ? TOBACCO USE, QUIT 02/07/2007  ? Qualifier: Diagnosis of  By: Andria Frames MD, William    ? Tremor of both hands 12/15/2015  ? U R I 03/15/2010  ? Qualifier: Diagnosis of  By: Andria Frames MD, Gwyndolyn Saxon    ? ? ?Past Surgical History:  ?Procedure Laterality Date  ? ABDOMINAL HYSTERECTOMY    ? APPENDECTOMY    ? CATARACT EXTRACTION, BILATERAL Bilateral 03/2010, 04/2010  ? COLONOSCOPY  2011  ? POLYPECTOMY  2011  ? polyps, hems   ?  SPINE SURGERY    ? lumbar laminectomy X 2  ? TONSILLECTOMY    ? ? ?Current Outpatient Medications  ?Medication Sig Dispense Refill  ? acetaminophen (TYLENOL) 650 MG CR tablet Take 650 mg by mouth every 8 (eight) hours as needed.     ? albuterol (PROAIR HFA) 108 (90 Base) MCG/ACT inhaler Inhale 2 puffs into the lungs every 6 (six) hours as needed for wheezing or shortness of breath. 1 each 2  ? amLODipine (NORVASC) 10 MG tablet Take 1 tablet (10 mg total) by mouth daily. 90 tablet 3  ? aspirin 81 MG tablet Take 81 mg by mouth daily.    ? Blood Glucose Monitoring Suppl (ONE TOUCH ULTRA 2) w/Device KIT Test blood sugar three times per day. 1 each 0  ? Blood Pressure Monitoring (BLOOD PRESSURE KIT) DEVI 1 Act by Does not apply route  daily. 1 Device 0  ? Cholecalciferol (VITAMIN D) 125 MCG (5000 UT) CAPS Take 5,000 Int'l Units/day by mouth daily.    ? Continuous Blood Gluc Receiver (DEXCOM G6 RECEIVER) DEVI 1 Device by Does not apply route as directed. Use with Dexcom G6 sensor and transmitter 1 each 1  ? Continuous Blood Gluc Sensor (DEXCOM G6 SENSOR) MISC Inject 1 applicator into the skin as directed. Change sensor every 10 days. 3 each 11  ? Continuous Blood Gluc Transmit (DEXCOM G6 TRANSMITTER) MISC Inject 1 Device into the skin as directed. Reuse 8 times with sensor changes. 1 each 3  ? DULoxetine (CYMBALTA) 60 MG capsule Take 60 mg by mouth daily.    ? empagliflozin (JARDIANCE) 25 MG TABS tablet Take 1 tablet (25 mg total) by mouth daily. 90 tablet 3  ? furosemide (LASIX) 20 MG tablet Take 1 tablet (20 mg total) by mouth as needed. (Patient not taking: Reported on 06/19/2021) 30 tablet 3  ? gabapentin (NEURONTIN) 100 MG capsule Take 1 capsule (100 mg total) by mouth 2 (two) times daily as needed. (Patient not taking: Reported on 07/25/2021) 90 capsule 3  ? hydrochlorothiazide (HYDRODIURIL) 12.5 MG tablet TAKE 1 TABLET BY MOUTH  DAILY 90 tablet 3  ? insulin aspart (NOVOLOG FLEXPEN) 100 UNIT/ML FlexPen 10-20 units three times per day with meals. 6 mL 11  ? insulin degludec (TRESIBA FLEXTOUCH) 100 UNIT/ML FlexTouch Pen Inject 25 Units into the skin daily. (Patient taking differently: Inject 24 Units into the skin daily.)    ? Insulin Pen Needle (B-D ULTRAFINE III SHORT PEN) 31G X 8 MM MISC USE TO INJECT INSULIN 4 times daily. 360 each 3  ? losartan (COZAAR) 100 MG tablet TAKE 1 TABLET BY MOUTH  DAILY 90 tablet 3  ? metFORMIN (GLUCOPHAGE-XR) 500 MG 24 hr tablet Take 2 tablets (1,000 mg total) by mouth 2 (two) times daily with a meal. (Patient taking differently: Take 500 mg by mouth 3 (three) times daily.) 360 tablet 3  ? metoprolol succinate (TOPROL-XL) 50 MG 24 hr tablet TAKE 1 TABLET BY MOUTH  EVERY EVENING. TAKE WITH OR IMMEDIATELY FOLLOWING  A  MEAL. 90 tablet 3  ? NEXLIZET 180-10 MG TABS TAKE 1 TABLET BY MOUTH  DAILY 90 tablet 1  ? nitroGLYCERIN (NITROSTAT) 0.4 MG SL tablet Place 1 tablet (0.4 mg total) under the tongue every 5 (five) minutes as needed for chest pain. (Patient not taking: Reported on 06/19/2021) 25 tablet 3  ? omeprazole (PRILOSEC) 40 MG capsule TAKE 1 CAPSULE(40 MG) BY MOUTH DAILY 90 capsule 0  ? ondansetron (ZOFRAN ODT) 4 MG disintegrating  tablet Take 1 tablet (4 mg total) by mouth every 8 (eight) hours as needed for nausea or vomiting. (Patient not taking: Reported on 06/19/2021) 20 tablet 0  ? ONETOUCH ULTRA test strip CHECK BLOOD SUGAR 3 TIMES  DAILY 300 strip 3  ? rosuvastatin (CRESTOR) 20 MG tablet TAKE 1 TABLET BY MOUTH  DAILY 90 tablet 0  ? Semaglutide, 1 MG/DOSE, (OZEMPIC, 1 MG/DOSE,) 4 MG/3ML SOPN Inject 1 mg into the skin once a week. Start taking after finishing four weeks of 0.5 mg weekly. 3 mL 11  ? spironolactone (ALDACTONE) 25 MG tablet TAKE 1 TABLET BY MOUTH  DAILY 90 tablet 3  ? timolol (TIMOPTIC) 0.5 % ophthalmic solution Place 1 drop into both eyes 2 times daily.    ? ?Current Facility-Administered Medications  ?Medication Dose Route Frequency Provider Last Rate Last Admin  ? 0.9 %  sodium chloride infusion  500 mL Intravenous Continuous Milus Banister, MD      ? ? ?Allergies as of 08/15/2021 - Review Complete 08/15/2021  ?Allergen Reaction Noted  ? Penicillins Rash and Other (See Comments)   ? ? ?Family History  ?Problem Relation Age of Onset  ? Cervical cancer Mother   ? Stroke Father   ? Alcohol abuse Father   ? Heart disease Brother   ? Alcohol abuse Brother   ? Breast cancer Maternal Aunt   ? Pancreatic cancer Maternal Uncle   ? Stroke Paternal Grandmother   ? Hypertension Other   ?     siblings  ? Diabetes Other   ?     siblings  ? Colon cancer Neg Hx   ? Esophageal cancer Neg Hx   ? Rectal cancer Neg Hx   ? Stomach cancer Neg Hx   ? Colon polyps Neg Hx   ? Crohn's disease Neg Hx   ? ? ?Social History   ? ?Socioeconomic History  ? Marital status: Single  ?  Spouse name: Not on file  ? Number of children: 0  ? Years of education: college  ? Highest education level: Not on file  ?Occupational History  ? [employer]

## 2021-08-15 NOTE — Progress Notes (Signed)
1107- patient and sister in consultation room, waiting for ride ?

## 2021-08-15 NOTE — Op Note (Signed)
[place] ?Patient Name: Rebecca Hicks ?Procedure Date: 08/15/2021 10:04 AM ?MRN: [id number] ?Endoscopist: Milus Banister , MD ?Age: 76 ?Referring MD:  ?Date of Birth: July 23, 1945 ?Gender: Female ?Account #: [id number] ?Procedure:                Colonoscopy ?Indications:              High risk colon cancer surveillance: Personal  ?                          history of colonic polyps: Colonoscopy 2016 Dr.  Ardis Hughs 7 subCM adenomas. Colonoscopy 04/2021 Dr.  Ardis Hughs 3 subCM adenomas ?Medicines:                Monitored Anesthesia Care ?Procedure:                Pre-Anesthesia Assessment: ?                          - Prior to the procedure, a History and Physical  ?                          was performed, and patient medications and  ?                          allergies were reviewed. The patient's tolerance of  ?                          previous anesthesia was also reviewed. The risks  ?                          and benefits of the procedure and the sedation  ?                          options and risks were discussed with the patient.  ?                          All questions were answered, and informed consent  ?                          was obtained. Prior Anticoagulants: The patient has  ?                          taken no previous anticoagulant or antiplatelet  ?                          agents. ASA Grade Assessment: II - A patient with  ?                          mild systemic disease. After reviewing the risks  ?  and benefits, the patient was deemed in  ?                          satisfactory condition to undergo the procedure. ?                          After obtaining informed consent, the colonoscope  ?                          was passed under direct vision. Throughout the  ?                          procedure, the patient's blood pressure, pulse, and  ?                          oxygen saturations were monitored  continuously. The  ?                          Colonoscope was introduced through the anus and  ?                          advanced to the the cecum, identified by  ?                          appendiceal orifice and ileocecal valve. The  ?                          colonoscopy was performed without difficulty. The  ?                          patient tolerated the procedure well. The quality  ?                          of the bowel preparation was good. The ileocecal  ?                          valve, appendiceal orifice, and rectum were  ?                          photographed. ?Scope In: 10:20:30 AM ?Scope Out: 10:32:00 AM ?Scope Withdrawal Time: 0 hours 7 minutes 31 seconds  ?Total Procedure Duration: 0 hours 11 minutes 30 seconds  ?Findings:                 Multiple small and large-mouthed diverticula were  ?                          found in the left colon. ?                          The exam was otherwise without abnormality on  ?                          direct and retroflexion views. ?Complications:            No immediate complications.  Estimated blood loss:  ?                          None. ?Estimated Blood Loss:     Estimated blood loss: none. ?Impression:               - Diverticulosis in the left colon. ?                          - The examination was otherwise normal on direct  ?                          and retroflexion views. ?                          - No polyps or cancers. ?Recommendation:           - Patient has a contact number available for  ?                          emergencies. The signs and symptoms of potential  ?                          delayed complications were discussed with the  ?                          patient. Return to normal activities tomorrow.  ?                          Written discharge instructions were provided to the  ?                          patient. ?                          - Resume previous diet. ?                          - Continue present medications. ?                           - Repeat colonoscopy in 5 years for surveillance. ?Milus Banister, MD ?08/15/2021 10:34:45 AM ?This report has been signed electronically. ?

## 2021-08-15 NOTE — Progress Notes (Signed)
PT taken to PACU. Monitors in place. VSS. Report given to RN. 

## 2021-08-17 ENCOUNTER — Telehealth: Payer: Self-pay

## 2021-08-17 NOTE — Telephone Encounter (Signed)
?  Follow up Call- ? ? ?  08/15/2021  ?  9:52 AM 12/31/2018  ?  8:53 AM  ?Call back number  ?Post procedure Call Back phone  # [phone]  ?Permission to leave phone message Yes Yes  ?  ? ?Patient questions: ? ?Do you have a fever, pain , or abdominal swelling? No. ?Pain Score  0 * ? ?Have you tolerated food without any problems? Yes.   ? ?Have you been able to return to your normal activities? Yes.   ? ?Do you have any questions about your discharge instructions: ?Diet   No. ?Medications  No. ?Follow up visit  No. ? ?Do you have questions or concerns about your Care? No. ? ?Actions: ?* If pain score is 4 or above: ?No action needed, pain <4. ? ? ?

## 2021-08-20 ENCOUNTER — Other Ambulatory Visit: Payer: Self-pay | Admitting: Gastroenterology

## 2021-08-22 ENCOUNTER — Telehealth: Payer: Self-pay

## 2021-08-22 NOTE — Telephone Encounter (Signed)
Left pt message informing her that her novo nordisk medication shipment is ready for pickup ? ?4 boxes of ozempic labeled and ready in med room fridge ?

## 2021-09-05 ENCOUNTER — Telehealth: Payer: Self-pay

## 2021-09-05 NOTE — Telephone Encounter (Signed)
Left message informing pt she has medication ready for pickup at office. ? ?5 boxes of novolog flexpens are labeled and ready in med room fridge ?

## 2021-09-26 ENCOUNTER — Ambulatory Visit: Payer: Medicare Other | Admitting: Emergency Medicine

## 2021-09-26 ENCOUNTER — Encounter: Payer: Self-pay | Admitting: Emergency Medicine

## 2021-09-26 ENCOUNTER — Encounter: Payer: Self-pay | Admitting: *Deleted

## 2021-09-26 DIAGNOSIS — D869 Sarcoidosis, unspecified: Secondary | ICD-10-CM | POA: Diagnosis not present

## 2021-09-26 DIAGNOSIS — J984 Other disorders of lung: Secondary | ICD-10-CM | POA: Diagnosis not present

## 2021-09-26 DIAGNOSIS — G4736 Sleep related hypoventilation in conditions classified elsewhere: Secondary | ICD-10-CM

## 2021-09-26 NOTE — Assessment & Plan Note (Signed)
Please keep your albuterol available to use 2 puffs if needed for shortness of breath, chest tightness, wheezing. We will plan to repeat your pulmonary function testing and CT scan of the chest at some point next year.  We can determine the exact timing when we follow-up. Follow with Dr Lamonte Sakai in 3 months or sooner if you have any problems.

## 2021-09-26 NOTE — Progress Notes (Signed)
HPI:   ROV 12/30/19 --Rebecca Hicks is 76, follows up today for her sarcoidosis and associated obstructive lung disease, OSA and GERD, allergic rhinitis.  At her last visit we discussed trying to get back on CPAP.  I tried to arrange for a best fit mask study - had to cancel but is planning to reschedule.  She is not currently using it.  We repeated her pulmonary function testing and CT scan of the chest to follow her sarcoidosis is below.  She was not taking Symbicort reliably so we stopped it and she reports.  CT chest done 12/17/2019 reviewed by me, shows some persistent, stable subcentimeter calcified granulomatous disease, scattered cylindrical bronchiectatic change especially in the right middle lobe, right lower lobe, left lower lobe with some scattered mild groundglass opacity and reticulation Pulmonary function testing done today, reviewed by me showed normal lung volumes, spirometry suggestive of hyperinflation but without any overt obstruction.  There was no bronchodilator response.  Diffusion capacity normal. MDM: Reviewed ophthalmology notes from 9/7 Reviewed CT chest, PFT   ROV 09/26/21 --76 year old woman with a history of sarcoidosis, associated obstructive lung disease, obstructive sleep apnea.  She also has chronic cough with GERD and allergic rhinitis.  She has had a difficult time tolerating and staying on CPAP.  She reports today that she was treated with abx in 04/2021 for a PNA. Repeat CXR 06/06/2021 was clear. Not on scheduled BDs, she has albuterol that she uses rarely. She describes poor sleep, frequent awakenings. Family has heard snoring and wheeze at night. Her machine is not operational.  Her last PFT 12/30/2019 showed hyperinflation without obstruction.    08/02/2016 PSG >>  IMPRESSIONS - The optimal PAP pressure was 10 cm of water. - She required 3 liters oxygen in addition to CPAP to maintain oxygen saturation.   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 - She was fitted with a Small size Fisher&Paykel Full Face Mask Simplus mask and heated humidification.    Exam:  Vitals:   09/26/21 1558  BP: 126/78  Pulse: 76  Temp: 98 F (36.7 C)  TempSrc: Oral  SpO2: 94%  Weight: 255 lb (115.7 kg)  Height: 5' 5.5" (1.664 m)     Gen: Pleasant, obese, in no distress,  normal affect, obese   ENT: No lesions,  mouth clear,  oropharynx clear, no postnasal drip  Neck: No JVD, no Stridor  Lungs: No use of accessory muscles, somewhat distant, no crackles or wheezing   Cardiovascular: RRR, heart sounds normal, no murmur or gallops, no peripheral edema  Musculoskeletal: No deformities, no cyanosis or clubbing  Neuro: alert, non focal  Skin: Some old hyperpigmentation but no evidence of active sarcoid rash    SARCOIDOSIS, PULMONARY Please keep your albuterol available to use 2 puffs if needed for shortness of breath, chest tightness, wheezing. We will plan to repeat your pulmonary function testing and CT scan of the chest at some point next year.  We can determine the exact timing when we follow-up. Follow with Dr Lamonte Sakai in 3 months or sooner if you have any problems.  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  Hopefully you are sleep study from 07/2016 will be adequate to get this covered.  If not we will order a  new sleep study for you.   Baltazar Apo, MD, PhD 09/26/2021, 4:16 PM [facility] [phone] or if no answer [phone]

## 2021-09-26 NOTE — Patient Instructions (Signed)
Please keep your albuterol available to use 2 puffs if needed for shortness of breath, chest tightness, wheezing. We will plan to repeat your pulmonary function testing and CT scan of the chest at some point next year.  We can determine the exact timing when we follow-up. We will order new CPAP machine and equipment for you.  Hopefully you are sleep study from 07/2016 will be adequate to get this covered.  If not we will order a new sleep study for you. Follow with Dr Lamonte Sakai in 3 months or sooner if you have any problems.

## 2021-09-26 NOTE — Addendum Note (Signed)
Addended by: Gavin Potters R on: 09/26/2021 05:05 PM   Modules accepted: Orders

## 2021-09-26 NOTE — Assessment & Plan Note (Signed)
We will order new CPAP machine and equipment for you.  Hopefully you are sleep study from 07/2016 will be adequate to get this covered.  If not we will order a new sleep study for you.

## 2021-09-27 ENCOUNTER — Other Ambulatory Visit: Payer: Self-pay | Admitting: Cardiovascular Disease

## 2021-09-30 ENCOUNTER — Other Ambulatory Visit: Payer: Self-pay | Admitting: Cardiovascular Disease

## 2021-10-09 ENCOUNTER — Other Ambulatory Visit: Payer: Self-pay | Admitting: Family Medicine

## 2021-10-09 DIAGNOSIS — I10 Essential (primary) hypertension: Secondary | ICD-10-CM

## 2021-10-21 ENCOUNTER — Encounter (HOSPITAL_COMMUNITY): Payer: Self-pay | Admitting: Emergency Medicine

## 2021-10-21 ENCOUNTER — Ambulatory Visit (HOSPITAL_COMMUNITY)
Admission: EM | Admit: 2021-10-21 | Discharge: 2021-10-21 | Disposition: A | Discharge status: Home / Self Care | Payer: Medicare Other | Attending: Physician Assistant | Admitting: Physician Assistant

## 2021-10-21 ENCOUNTER — Other Ambulatory Visit: Payer: Self-pay

## 2021-10-21 ENCOUNTER — Ambulatory Visit (INDEPENDENT_AMBULATORY_CARE_PROVIDER_SITE_OTHER): Payer: Medicare Other

## 2021-10-21 DIAGNOSIS — J40 Bronchitis, not specified as acute or chronic: Secondary | ICD-10-CM | POA: Diagnosis not present

## 2021-10-21 DIAGNOSIS — J329 Chronic sinusitis, unspecified: Secondary | ICD-10-CM

## 2021-10-21 DIAGNOSIS — R059 Cough, unspecified: Secondary | ICD-10-CM | POA: Diagnosis not present

## 2021-10-21 MED ORDER — BENZONATATE 100 MG PO CAPS: 100.0000 mg | ORAL_CAPSULE | Freq: Three times a day (TID) | ORAL | 0 refills | Status: DC

## 2021-10-21 MED ORDER — DOXYCYCLINE HYCLATE 100 MG PO CAPS: 100.0000 mg | ORAL_CAPSULE | Freq: Two times a day (BID) | ORAL | 0 refills | Status: DC

## 2021-10-21 NOTE — ED Triage Notes (Signed)
On Tuesday, symptoms started with cough, hoarseness, sore throat.  Phlegm is a dark yellow.  Patient has been taking coricidin capsules for congestion and cough and tylenol

## 2021-10-21 NOTE — ED Provider Notes (Signed)
[place]    CSN: [id number] Arrival date & time: 10/21/21  1242      History   Chief Complaint Chief Complaint  Patient presents with   Cough    HPI Rebecca Hicks is a 76 y.o. female.   Patient presents today with a 5 to 6-day history of URI symptoms that have worsened in the past day prompting evaluation.  She reports hoarseness, sore throat, drainage, cough.  Denies any shortness of breath, chest pain, nausea, vomiting, diarrhea.  She denies any known sick contacts.  She has tried Coricidin HBP as well as Tylenol without improvement of symptoms.  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  She has not required her albuterol inhaler recently since onset of symptoms.  She is not taking any maintenance medications.  She is followed by pulmonology was last seen 09/26/2021.  She does report a history of pneumonia several months ago (around February 2023) but denies additional antibiotics since that time.  She reports that sputum has become thick and colored prompting evaluation.  She is having difficulty sleeping due to cough.    Past Medical History:  Diagnosis Date   Abdominal pain 12/05/2011   Abdominal pain, left upper quadrant 12/03/2012   Acute on chronic renal failure ([place]) 12/25/2012   Allergy    SEASONAL   Arthritis    Arthropathy 11/24/2016   cervical spine   Ataxia 12/15/2015   Atypical chest pain 03/28/2016   Cataract    BILATERAL-REMOVED   CERUMEN IMPACTION, RIGHT 01/31/2010   Qualifier: Diagnosis of  By: Dianah Field MD, [place]     Chest tightness 02/23/2014   Chronic kidney disease (CKD), stage II (mild) 03/05/2013   Looks like baseline creat = 1.2 -1.3.  Watch for overdiuresis.    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 - She was fitted with a Small size [employer] Full Face Mask Simplus mask and heated humidification.     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, POSITIVE 02/07/2007   Annotation: with clear CXR mid 1990's Qualifier: History of  By: Wynetta Emery RN, Erika     TOBACCO USE, QUIT 02/07/2007   Qualifier: Diagnosis of  By: Andria Frames MD, William     Tremor of both hands 12/15/2015   U R I 03/15/2010   Qualifier: Diagnosis of  By: Andria Frames MD, Gwyndolyn Saxon      Patient Active Problem List   Diagnosis Date Noted   History of  positive PPD 06/20/2021   Postherpetic neuralgia 11/03/2020   Adjustment reaction with anxiety and depression 06/29/2019   Dysphagia 12/11/2018   Nausea & vomiting 06/04/2018   Diabetic retinopathy ([place]) 10/28/2017   Mixed stress and urge urinary incontinence 10/03/2017   Dependent edema 10/03/2017   Lumbosacral radiculopathy 09/20/2017   Ulnar neuropathy at elbow of left upper extremity 09/20/2017   Coronary artery calcification 02/28/2017   DDD (degenerative disc disease), cervical 11/24/2016   Arthropathy 11/24/2016   Left arm numbness 10/31/2016   Tremor of both hands 12/15/2015   Obstructive sleep apnea 11/17/2015   Sleep-related hypoventilation due to pulmonary parenchymal pathology 11/10/2015   Periodic limb movements of sleep 11/10/2015   Obesity hypoventilation syndrome ([place]) 11/10/2015   CKD (chronic kidney disease) stage 3, GFR 30-59 ml/min (HCC) 03/05/2013   Fatigue 05/04/2011   COLONIC POLYPS, ADENOMATOUS 05/03/2009   GLAUCOMA NOS 02/25/2009   Diabetic neuropathy, type II diabetes mellitus ([place]) 05/28/2008   SARCOIDOSIS, PULMONARY 02/07/2007   DM (diabetes mellitus), type 2 with renal complications ([place]) 02/06/5101   Hyperlipidemia 02/07/2007   Morbid obesity ([place]) 02/07/2007   Essential hypertension 02/07/2007   TB SKIN TEST, POSITIVE 02/07/2007   TOBACCO USE, QUIT 02/07/2007   HYSTERECTOMY, HX OF 02/07/2007    Past Surgical History:  Procedure Laterality Date   ABDOMINAL HYSTERECTOMY     APPENDECTOMY     CATARACT EXTRACTION, BILATERAL Bilateral 03/2010, 04/2010   COLONOSCOPY  2011   POLYPECTOMY  2011   polyps, hems    SPINE SURGERY     lumbar laminectomy X 2   TONSILLECTOMY      OB History   No obstetric history on file.      Home Medications    Prior to Admission medications   Medication Sig Start Date End Date Taking? Authorizing Provider  benzonatate (TESSALON) 100 MG capsule Take 1 capsule (100 mg total) by mouth every 8 (eight) hours.  10/21/21  Yes Kimya Mccahill K, PA-C  doxycycline (VIBRAMYCIN) 100 MG capsule Take 1 capsule (100 mg total) by mouth 2 (two) times daily. 10/21/21  Yes Belita Warsame, Derry Skill, PA-C  acetaminophen (TYLENOL) 650 MG CR tablet Take 650 mg by mouth every 8 (eight) hours as needed.     [provider]  albuterol (PROAIR HFA) 108 (90 Base) MCG/ACT inhaler Inhale 2 puffs into the lungs every 6 (six) hours as needed for wheezing or shortness of breath. 05/04/21   Zenia Resides, MD  amLODipine (NORVASC) 10 MG tablet TAKE 1 TABLET BY MOUTH  DAILY 10/09/21   Zenia Resides, MD  aspirin 81 MG tablet Take 81 mg by mouth daily.    [provider]  Blood Glucose Monitoring Suppl (ONE TOUCH ULTRA 2) w/Device KIT Test blood sugar three times  per day. 06/06/17   Zenia Resides, MD  Blood Pressure Monitoring (BLOOD PRESSURE KIT) DEVI 1 Act by Does not apply route daily. 06/06/17   Zenia Resides, MD  Cholecalciferol (VITAMIN D) 125 MCG (5000 UT) CAPS Take 5,000 Int'l Units/day by mouth daily.    [provider]  Continuous Blood Gluc Receiver (DEXCOM G6 RECEIVER) DEVI 1 Device by Does not apply route as directed. Use with Dexcom G6 sensor and transmitter 07/01/20   Zenia Resides, MD  Continuous Blood Gluc Sensor (DEXCOM G6 SENSOR) MISC Inject 1 applicator into the skin as directed. Change sensor every 10 days. 07/01/20   Zenia Resides, MD  Continuous Blood Gluc Transmit (DEXCOM G6 TRANSMITTER) MISC Inject 1 Device into the skin as directed. Reuse 8 times with sensor changes. 07/01/20   Zenia Resides, MD  DULoxetine (CYMBALTA) 60 MG capsule Take 60 mg by mouth daily.    [provider]  empagliflozin (JARDIANCE) 25 MG TABS tablet Take 1 tablet (25 mg total) by mouth daily. 05/04/21   Zenia Resides, MD  furosemide (LASIX) 20 MG tablet Take 1 tablet (20 mg total) by mouth as needed. 06/07/20   Nahser, Wonda Cheng, MD  gabapentin (NEURONTIN) 100 MG capsule Take 1 capsule (100 mg total)  by mouth 2 (two) times daily as needed. 11/02/20   Concepcion Living, MD  hydrochlorothiazide (HYDRODIURIL) 12.5 MG tablet TAKE 1 TABLET BY MOUTH  DAILY 05/04/21   Zenia Resides, MD  insulin aspart (NOVOLOG FLEXPEN) 100 UNIT/ML FlexPen 10-20 units three times per day with meals. 06/05/21   Zenia Resides, MD  insulin degludec (TRESIBA FLEXTOUCH) 100 UNIT/ML FlexTouch Pen Inject 25 Units into the skin daily. Patient taking differently: Inject 24 Units into the skin daily. 05/25/21   Zenia Resides, MD  Insulin Pen Needle (B-D ULTRAFINE III SHORT PEN) 31G X 8 MM MISC USE TO INJECT INSULIN 4 times daily. 03/30/21   Zenia Resides, MD  losartan (COZAAR) 100 MG tablet TAKE 1 TABLET BY MOUTH  DAILY 06/17/20   Zenia Resides, MD  metFORMIN (GLUCOPHAGE-XR) 500 MG 24 hr tablet Take 2 tablets (1,000 mg total) by mouth 2 (two) times daily with a meal. Patient taking differently: Take 500 mg by mouth 3 (three) times daily. 03/30/21   Zenia Resides, MD  metoprolol succinate (TOPROL-XL) 50 MG 24 hr tablet TAKE 1 TABLET BY MOUTH  EVERY EVENING. TAKE WITH OR IMMEDIATELY FOLLOWING A  MEAL. 06/17/20   Zenia Resides, MD  NEXLIZET 180-10 MG TABS TAKE 1 TABLET BY MOUTH  DAILY 07/24/21   Nahser, Wonda Cheng, MD  nitroGLYCERIN (NITROSTAT) 0.4 MG SL tablet Place 1 tablet (0.4 mg total) under the tongue every 5 (five) minutes as needed for chest pain. 06/07/20   Nahser, Wonda Cheng, MD  omeprazole (PRILOSEC) 40 MG capsule TAKE 1 CAPSULE(40 MG) BY MOUTH DAILY 08/21/21   Milus Banister, MD  ondansetron (ZOFRAN ODT) 4 MG disintegrating tablet Take 1 tablet (4 mg total) by mouth every 8 (eight) hours as needed for nausea or vomiting. 11/02/20   Caron Presume, Angelyn Punt, MD  [facility] ULTRA test strip CHECK BLOOD SUGAR 3 TIMES  DAILY 01/07/20   Zenia Resides, MD  rosuvastatin (CRESTOR) 20 MG tablet TAKE 1 TABLET BY MOUTH DAILY 10/02/21   Nahser, Wonda Cheng, MD  Semaglutide, 1 MG/DOSE, (OZEMPIC, 1 MG/DOSE,) 4 MG/3ML SOPN Inject 1 mg  into the skin once a week. Start taking after  finishing four weeks of 0.5 mg weekly. 03/30/21   Zenia Resides, MD  spironolactone (ALDACTONE) 25 MG tablet TAKE 1 TABLET BY MOUTH  DAILY 10/25/20   Zenia Resides, MD  timolol (TIMOPTIC) 0.5 % ophthalmic solution Place 1 drop into both eyes 2 times daily. 05/23/18 08/22/22  [provider]    Family History Family History  Problem Relation Age of Onset   Cervical cancer Mother    Stroke Father    Alcohol abuse Father    Heart disease Brother    Alcohol abuse Brother    Breast cancer Maternal Aunt    Pancreatic cancer Maternal Uncle    Stroke Paternal Grandmother    Hypertension Other        siblings   Diabetes Other        siblings   Colon cancer Neg Hx    Esophageal cancer Neg Hx    Rectal cancer Neg Hx    Stomach cancer Neg Hx    Colon polyps Neg Hx    Crohn's disease Neg Hx     Social History Social History   Tobacco Use   Smoking status: Former    Packs/day: 1.00    Years: 20.00    Total pack years: 20.00    Types: Cigarettes    Quit date: 04/24/1995    Years since quitting: 26.5    Passive exposure: Never   Smokeless tobacco: Never  Vaping Use   Vaping Use: Never used  Substance Use Topics   Alcohol use: Yes    Alcohol/week: 0.0 standard drinks of alcohol    Comment: rarely   Drug use: No     Allergies   Penicillins   Review of Systems Review of Systems  Constitutional:  Positive for activity change. Negative for fatigue and fever.  HENT:  Positive for congestion, sore throat and voice change. Negative for sinus pressure, sneezing and trouble swallowing.   Respiratory:  Positive for cough. Negative for shortness of breath.   Cardiovascular:  Negative for chest pain.  Gastrointestinal:  Negative for abdominal pain, diarrhea, nausea and vomiting.  Neurological:  Positive for headaches. Negative for dizziness and light-headedness.     Physical Exam Triage Vital Signs ED Triage Vitals  Enc  Vitals Group     BP 10/21/21 1446 130/64     Pulse Rate 10/21/21 1446 84     Resp 10/21/21 1446 20     Temp 10/21/21 1446 98.2 F (36.8 C)     Temp Source 10/21/21 1446 Oral     SpO2 10/21/21 1446 100 %     Weight --      Height --      Head Circumference --      Peak Flow --      Pain Score 10/21/21 1443 7     Pain Loc --      Pain Edu? --      Excl. in [place]? --    No data found.  Updated Vital Signs BP 130/64 (BP Location: Right Arm) Comment (BP Location): large cuff  Pulse 84   Temp 98.2 F (36.8 C) (Oral)   Resp 20   SpO2 100%   Visual Acuity Right Eye Distance:   Left Eye Distance:   Bilateral Distance:    Right Eye Near:   Left Eye Near:    Bilateral Near:     Physical Exam Vitals reviewed.  Constitutional:      General: She is awake. She is not  in acute distress.    Appearance: Normal appearance. She is well-developed. She is not ill-appearing.     Comments: Very pleasant female appears stated age in no acute distress sitting comfortably in exam room  HENT:     Head: Normocephalic and atraumatic.     Right Ear: Tympanic membrane, ear canal and external ear normal. There is impacted cerumen. Tympanic membrane is not erythematous or bulging.     Left Ear: Tympanic membrane, ear canal and external ear normal. Tympanic membrane is not erythematous or bulging.     Ears:     Comments: Right ear: Partial cerumen impaction; able to see approximately 20% of TM that appears normal.    Nose:     Right Sinus: Maxillary sinus tenderness present. No frontal sinus tenderness.     Left Sinus: Maxillary sinus tenderness and frontal sinus tenderness present.     Mouth/Throat:     Pharynx: Uvula midline. No oropharyngeal exudate or posterior oropharyngeal erythema.  Cardiovascular:     Rate and Rhythm: Normal rate and regular rhythm.     Heart sounds: Normal heart sounds, S1 normal and S2 normal. No murmur heard. Pulmonary:     Effort: Pulmonary effort is normal.      Breath sounds: Rhonchi present. No wheezing or rales.     Comments: Scattered rhonchi partially clear with cough Psychiatric:        Behavior: Behavior is cooperative.      UC Treatments / Results  Labs (all labs ordered are listed, but only abnormal results are displayed) Labs Reviewed - No data to display  EKG   Radiology DG Chest 2 View  Result Date: 10/21/2021 CLINICAL DATA:  worsening cough EXAM: CHEST - 2 VIEW COMPARISON:  Chest x-ray 06/06/2021 FINDINGS: The heart and mediastinal contours are unchanged. Aortic calcification. Biapical pleural/pulmonary scarring. No focal consolidation. No pulmonary edema. No pleural effusion. No pneumothorax. No acute osseous abnormality. IMPRESSION: 1. No active cardiopulmonary disease. 2.  Aortic Atherosclerosis (ICD10-I70.0). Electronically Signed   By: Iven Finn M.D.   On: 10/21/2021 15:29    Procedures Procedures (including critical care time)  Medications Ordered in UC Medications - No data to display  Initial Impression / Assessment and Plan / UC Course  I have reviewed the triage vital signs and the nursing notes.  Pertinent labs & imaging results that were available during my care of the patient were reviewed by me and considered in my medical decision making (see chart for details).     Patient is well-appearing, afebrile, nontoxic, nontachycardic.  No indication for viral testing as patient has been symptomatic for over 5 days and this would not change management.  Given rhonchi on exam and history of sarcoidosis/obstructive pulmonary disease x-ray was obtained that showed no acute findings but did show aortic atherosclerosis.  Given worsening symptoms with increased sputum production will cover for sinobronchitis/COPD exacerbation.  She was started on doxycycline 100 mg twice daily for 10 days.  Recommended over-the-counter medications including Tylenol, Mucinex, Flonase for symptom relief.  She is to use her albuterol as  needed.  If symptoms or not improving within a week she is to return for reevaluation.  Discussed that if anything worsen she is to return immediately for reevaluation including chest pain, shortness of breath, high fever, nausea/vomiting interfering with oral intake, weakness.  Strict return precautions given.  Final Clinical Impressions(s) / UC Diagnoses   Final diagnoses:  Sinobronchitis     Discharge Instructions  Your x-ray was normal with no evidence of pneumonia.  Please start doxycycline 100 mg twice daily for 10 days.  This can make you sensitive to the sun so stay out of sun while on this.  Continue albuterol as needed and use Mucinex, Flonase, Tylenol for symptom relief.  I have called in Tessalon for cough.  Make sure you rest and drink plenty of fluid.  If your symptoms or not improving within a week please return for reevaluation.  If at any point anything worsens you need to return immediately for reevaluation or go to the emergency room if symptoms are severe.     ED Prescriptions     Medication Sig Dispense Auth. Provider   doxycycline (VIBRAMYCIN) 100 MG capsule Take 1 capsule (100 mg total) by mouth 2 (two) times daily. 20 capsule Kierria Feigenbaum K, PA-C   benzonatate (TESSALON) 100 MG capsule Take 1 capsule (100 mg total) by mouth every 8 (eight) hours. 21 capsule Camari Quintanilla K, PA-C      PDMP not reviewed this encounter.   Terrilee Croak, PA-C 10/21/21 1555

## 2021-10-21 NOTE — Discharge Instructions (Addendum)
Your x-ray was normal with no evidence of pneumonia.  Please start doxycycline 100 mg twice daily for 10 days.  This can make you sensitive to the sun so stay out of sun while on this.  Continue albuterol as needed and use Mucinex, Flonase, Tylenol for symptom relief.  I have called in Tessalon for cough.  Make sure you rest and drink plenty of fluid.  If your symptoms or not improving within a week please return for reevaluation.  If at any point anything worsens you need to return immediately for reevaluation or go to the emergency room if symptoms are severe.

## 2021-11-15 ENCOUNTER — Telehealth: Payer: Self-pay

## 2021-11-15 NOTE — Telephone Encounter (Signed)
Faxed refills/dose decrease for novolog flexpen to [facility].

## 2021-11-27 ENCOUNTER — Ambulatory Visit: Payer: Medicare Other | Admitting: Family Medicine

## 2021-11-27 ENCOUNTER — Encounter: Payer: Self-pay | Admitting: Family Medicine

## 2021-11-27 VITALS — BP 140/82 | HR 73 | Ht 65.5 in | Wt 249.6 lb

## 2021-11-27 DIAGNOSIS — J984 Other disorders of lung: Secondary | ICD-10-CM

## 2021-11-27 DIAGNOSIS — R0600 Dyspnea, unspecified: Secondary | ICD-10-CM | POA: Insufficient documentation

## 2021-11-27 DIAGNOSIS — E1122 Type 2 diabetes mellitus with diabetic chronic kidney disease: Secondary | ICD-10-CM | POA: Diagnosis not present

## 2021-11-27 DIAGNOSIS — N1832 Chronic kidney disease, stage 3b: Secondary | ICD-10-CM | POA: Diagnosis not present

## 2021-11-27 DIAGNOSIS — Z9289 Personal history of other medical treatment: Secondary | ICD-10-CM

## 2021-11-27 DIAGNOSIS — I10 Essential (primary) hypertension: Secondary | ICD-10-CM

## 2021-11-27 DIAGNOSIS — M2550 Pain in unspecified joint: Secondary | ICD-10-CM | POA: Diagnosis not present

## 2021-11-27 DIAGNOSIS — G4736 Sleep related hypoventilation in conditions classified elsewhere: Secondary | ICD-10-CM

## 2021-11-27 DIAGNOSIS — D869 Sarcoidosis, unspecified: Secondary | ICD-10-CM

## 2021-11-27 LAB — POCT GLYCOSYLATED HEMOGLOBIN (HGB A1C): HbA1c, POC (controlled diabetic range): 8 % — AB (ref 0.0–7.0)

## 2021-11-27 NOTE — Assessment & Plan Note (Signed)
Just got CPAP fixed.  Will start wearing again regularly.

## 2021-11-27 NOTE — Assessment & Plan Note (Addendum)
Long differential.  R/O CHF, check BNP and if elevated repeat echo.  May have an element of COPD and may need a controler.  This could be a recurrance of her sarcoid.  It might simply be a post infectious cough.

## 2021-11-27 NOTE — Assessment & Plan Note (Signed)
In the differential.  Never treated.

## 2021-11-27 NOTE — Assessment & Plan Note (Signed)
Improved.  Anticipate further improvement with better diet.

## 2021-11-27 NOTE — Progress Notes (Signed)
    SUBJECTIVE:   CHIEF COMPLAINT / HPI:   Patient has had cough for two months.  No fever.  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  Was seen at Urgent care one month ago.  Treated with antibiotics despite normal CXR.  Improved while on antibiotics.  Cough recurred off antibiotics.  Has a history of diastolic CHF.  No orthopnea or swelling.  No hx of COPD.  Does have an albuterol inhaler which helps.    DM:  Doing well on current meds  A1C= 8.0.  She cared for three nieces, nephews over the summer.  Her diet was not as good as without the kids.  The kids are back with their family.  She anticipates her diet will improve.    PERTINENT  PMH / PSH: No weight loss, chest pain.  Cough is non productive, non bloody.  OBJECTIVE:   BP (!) 140/82   Pulse 73   Ht 5' 5.5" (1.664 m)   Wt 249 lb 9.6 oz (113.2 kg)   SpO2 93%   BMI 40.90 kg/m   Lungs clear Cardiac RRR without m or g 1+ bilateral edema.  ASSESSMENT/PLAN:   Dyspnea Long differential.  R/O CHF, check BNP and if elevated repeat echo.  May have an element of COPD and may need a controler.  This could be a recurrance of her sarcoid.  It might simply be a post infectious cough.    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  Will start wearing again regularly.  SARCOIDOSIS, PULMONARY Stable.  Doubt recurrance with normal CXR.    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  Never treated.    DM (diabetes mellitus), type 2 with renal complications (HCC) Improved.  Anticipate further improvement with better diet.     Zenia Resides, MD [place]

## 2021-11-27 NOTE — Assessment & Plan Note (Signed)
Stable.  Doubt recurrance with normal CXR.

## 2021-11-27 NOTE — Patient Instructions (Addendum)
I am not sure why you are still coughing and short of breath.  We will decide next steps after I see the blood work.  I will call next week. The diabetes test was OK,  I am glad that you will be eating a healthier diet moving forward. I am delight you had a great summer with the nieces and nephews.

## 2021-11-28 LAB — CMP14+EGFR
ALT: 13 IU/L (ref 0–32)
AST: 19 IU/L (ref 0–40)
Albumin/Globulin Ratio: 1.4 (ref 1.2–2.2)
Albumin: 4 g/dL (ref 3.8–4.8)
Alkaline Phosphatase: 75 IU/L (ref 44–121)
BUN/Creatinine Ratio: 20 (ref 12–28)
BUN: 30 mg/dL — ABNORMAL HIGH (ref 8–27)
Bilirubin Total: 0.4 mg/dL (ref 0.0–1.2)
CO2: 27 mmol/L (ref 20–29)
Calcium: 9.5 mg/dL (ref 8.7–10.3)
Chloride: 96 mmol/L (ref 96–106)
Creatinine, Ser: 1.5 mg/dL — ABNORMAL HIGH (ref 0.57–1.00)
Globulin, Total: 2.9 g/dL (ref 1.5–4.5)
Glucose: 131 mg/dL — ABNORMAL HIGH (ref 70–99)
Potassium: 4.7 mmol/L (ref 3.5–5.2)
Sodium: 137 mmol/L (ref 134–144)
Total Protein: 6.9 g/dL (ref 6.0–8.5)
eGFR: 36 mL/min/{1.73_m2} — ABNORMAL LOW (ref >59)

## 2021-11-28 LAB — CBC
Hematocrit: 41.3 % (ref 34.0–46.6)
Hemoglobin: 13.4 g/dL (ref 11.1–15.9)
MCH: 28.6 pg (ref 26.6–33.0)
MCHC: 32.4 g/dL (ref 31.5–35.7)
MCV: 88 fL (ref 79–97)
Platelets: 193 10*3/uL (ref 150–450)
RBC: 4.69 x10E6/uL (ref 3.77–5.28)
RDW: 15.4 % (ref 11.7–15.4)
WBC: 5.8 10*3/uL (ref 3.4–10.8)

## 2021-11-28 LAB — SEDIMENTATION RATE: Sed Rate: 56 mm/hr — ABNORMAL HIGH (ref 0–40)

## 2021-11-28 LAB — BRAIN NATRIURETIC PEPTIDE: BNP: 12.9 pg/mL (ref 0.0–100.0)

## 2021-11-28 LAB — TSH: TSH: 2.5 u[IU]/mL (ref 0.450–4.500)

## 2021-12-04 ENCOUNTER — Other Ambulatory Visit: Payer: Self-pay | Admitting: Family Medicine

## 2021-12-04 DIAGNOSIS — E11649 Type 2 diabetes mellitus with hypoglycemia without coma: Secondary | ICD-10-CM

## 2021-12-04 DIAGNOSIS — I10 Essential (primary) hypertension: Secondary | ICD-10-CM

## 2021-12-05 ENCOUNTER — Telehealth: Payer: Self-pay

## 2021-12-05 NOTE — Telephone Encounter (Signed)
Informed pt she has medication ready for pickup. Pt will try to come by tomorrow (12/06/21) for pickup.   4 boxes of ozempic are labeled and ready in med room ('1mg'$  dose).

## 2021-12-11 ENCOUNTER — Encounter: Payer: Self-pay | Admitting: Cardiovascular Disease

## 2021-12-11 NOTE — Progress Notes (Unsigned)
Cardiology Office Note   Date:  12/12/2021   ID:  Rebecca, Hicks Jun 24, 1945, MRN [id number]  PCP:  Rebecca Resides, MD  Cardiologist:   Rebecca Moores, MD   Chief Complaint  Patient presents with   Congestive Heart Failure   Hypertension   1. Hypertension 2 chest pain 3. Diabetes mellitus 4. Hyperlipidemia 5. Morbid obesity 6. Pulmonary sarcoidosis 7. Back pain   Nov. 3, 2015:  Rebecca Hicks is referred by an urgent care She presented with some neck pain. She has been having some CP and right  arm pain   The discomfort is not associated with any specific activity.  She has not been walking due to back pain for the past 5 months. She gets Fatigue with walking but does not have specific chest or shoulder pain. No associated with eating or drinking, not associated with taking a deep breath.  May possibly be related to increased anxiety.    The pain is in the center of the chest .  Is a tightness,  Last 15-20 minutes.  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 11, 2016:  Rebecca Hicks is a 76 y.o. female who presents for follow up of her chest tightness  She had a myoview in [place] which was low risk for CAD. She is feeling better. No further episodes of chest pain.  BP is elevated.  Dr. [name] just started clonidine patch  Aug 23, 2014 No CP ,  Having lots of sweating issues.     Dec.  6, 2017:   Rebecca Hicks is seen today for follow-up visit. I saw her one half years ago. She has a history of hypertension and atypical chest pain.  She saw Rebecca Hicks on November 11 for some further episodes of chest discomfort. A stress Myoview study was normal. Rebecca Hicks started her on Pepcid   She is feeling better.   Still has some DOE   She has been trying to complete a sleep study.   ( A fire alarm went off in the middle of her study and she could not  get back to sleep)  She has had too much congestion to do a sleep study since that  Dec. 6. 2017:  February 28, 2017: Rebecca Hicks is seen back today for follow-up of her hypertension, hyperlipidemia, She had a CT scan which showed coronary artery calcifications.  She had a Myoview study in 2016 which did not reveal any ischemia.  She continues to struggle with her weight.  Today her weight is 304 pounds.  No CP .   Dyspnea has been stable Uses CPAP. Has a hoarse voice.   Was started on GERD medication. She saw Dr. Redmond Hicks.  Dr. Redmond Hicks thought that her dysphonia was due to either gastroesophageal reflux disease or vocal cord atrophy.  No cancer was identified.  Avoids salt but still eats prosessed foods.   Nov. 11,   2019: Rebecca Hicks is seen back today for her HTN and coronary artery calcifications Wt. Is 286 lbs today ( down 18 lbs since last year )  Some exercise,   Physical therapy.  Eats very little processed foods.   No CP since she saw Rebecca Hicks in Sept. 2019  Echocardiogram Oct. 23 shows normal LV systolic function. Grade 1 diastolic dysfunction .  Trivial valvular disease.   Has  fallen on several occasions .      Feb. 15, 2022: Rebecca Hicks is seen today for follow up of her HTN , coronary artery calcifications and diastolic CHF Wt today is 643 lbs. ( down 24 lbs from previous visit in Nov. 2019)  No CP or dyspnea.  Has some sarcoidosis issues ( primarily a rash - treated by Rebecca Hicks )  Last echo in  10.19 showed normal LV function , grade 1 DD. BP is elevated, home readings are normal.  Had covid Jan. 11  Sinus issues, nasal - was likely Omicron . Rest of the family has been diagnosed.   She has episodes of sudden dyspnea.  Occasional episodes of CP for which she takes SL NTG ? Angina equivalent. Has 3 V coronary art calcifications by CT scan ( sarcoid study, not coronary calcium score )  Will get a 2 day myoview  LDL is 90.  Has 3 vessel coronary art calcifications.   She is tried  rosuvastatin 40 mg a day but did not tolerate this.  She had reduce the dose to 20 mg.  We have added Zetia but her LDL still is not to goal.  We will be referring her to the lipid clinic for further evaluation to consider PCSK9 inhibitors.   November 07, 2020: Rebecca Hicks is seen today for follow up of her HTN, coronary artery calcification, diastolic CHF, morbid obesity  Lexiscan myoview in March, 2022 was normal, low risk  Hx of sarcoidosis  Has been working with Rebecca Hicks for lipid management   Has had some right sided chest pain  Was found to have shingles.   Aug. 22, 2023 Rebecca Hicks is seen today for follow up of HTN, chronic diastolic CHF, CAD ,  OSA , morbid obesity  - wt is 254 lbs  Has hx of sarcoidosis  Initial BP is elevatred  Had pneumonia in January, bronchitis in June. Has had a chronic cough  Has seen Dr Rebecca Hicks  ,  BNP was 12.9  She is on Jardiance We discussed Entresto,  I'm not sure she will be able to afford it    Did not tolerate the higher dose of Ozympic      Past Medical History:  Diagnosis Date   Abdominal pain 12/05/2011   Abdominal pain, left upper quadrant 12/03/2012   Acute on chronic renal failure ([place]) 12/25/2012   Allergy    SEASONAL   Arthritis    Arthropathy 11/24/2016   cervical spine   Ataxia 12/15/2015   Atypical chest pain 03/28/2016   Cataract    BILATERAL-REMOVED   CERUMEN IMPACTION, RIGHT 01/31/2010   Qualifier: Diagnosis of  By: Dianah Field MD, Thomas     Chest tightness 02/23/2014   Chronic kidney disease (CKD), stage II (mild) 03/05/2013   Looks like baseline creat = 1.2 -1.3.  Watch for overdiuresis.    COLONIC POLYPS, ADENOMATOUS 05/03/2009   Every five year colonoscopy due to adenomatous polyp found 04/2009     DDD (degenerative disc disease), cervical 11/24/2016   Depression    "HISTORY OF IT,BUT OK NOW"   Diabetes mellitus    Diabetic neuropathy, type II diabetes mellitus ([place]) 05/28/2008   Qualifier: Diagnosis of  By:  Rebecca Frames MD, William     Diarrhea 01/21/2015   DM (diabetes mellitus), type 2, uncontrolled 02/07/2007   Qualifier: Diagnosis of  By: Wynetta Emery RN, Erika     Erythema nodosum 04/27/2011   Essential hypertension 02/07/2007   Qualifier: Diagnosis of  By: Wynetta Emery  RN, Doroteo Bradford     Excessive sleepiness 04/07/2014   Fatigue 05/04/2011   GERD (gastroesophageal reflux disease)    Glaucoma    GLAUCOMA NOS 02/25/2009   Qualifier: Diagnosis of  By: Rebecca Frames MD, [place] Lighter toe pain 09/18/2012   History of nuclear stress test    Myoview 11/17: EF 64, Normal pharmacologic nuclear stress test with no evidence of prior infarct or ischemia.   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 - She was fitted with a Small size [employer] Full Face Mask Simplus mask and heated humidification.     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, POSITIVE 02/07/2007   Annotation: with clear CXR mid 1990's Qualifier: History of  By: Wynetta Emery RN, Erika     TOBACCO USE, QUIT 02/07/2007   Qualifier: Diagnosis of  By: Rebecca Frames MD, William     Tremor of both hands 12/15/2015   U R I 03/15/2010   Qualifier: Diagnosis of  By: Rebecca Frames MD, Gwyndolyn Saxon      Past Surgical History:  Procedure Laterality Date   ABDOMINAL HYSTERECTOMY     APPENDECTOMY     CATARACT EXTRACTION, BILATERAL Bilateral 03/2010, 04/2010   COLONOSCOPY  2011   POLYPECTOMY  2011    polyps, hems    SPINE SURGERY     lumbar laminectomy X 2   TONSILLECTOMY       Current Outpatient Medications  Medication Sig Dispense Refill   acetaminophen (TYLENOL) 650 MG CR tablet Take 650 mg by mouth every 8 (eight) hours as needed.      albuterol (PROAIR HFA) 108 (90 Base) MCG/ACT inhaler Inhale 2 puffs into the lungs every 6 (six) hours as needed for wheezing or shortness of breath. 1 each 2   amLODipine (NORVASC) 10 MG tablet TAKE 1 TABLET BY MOUTH  DAILY 90 tablet 3   aspirin 81 MG tablet Take 81 mg by mouth daily.     Blood Glucose Monitoring Suppl (ONE TOUCH ULTRA 2) w/Device KIT Test blood sugar three times per day. 1 each 0   Blood Pressure Monitoring (BLOOD PRESSURE KIT) DEVI 1 Act by Does not apply route daily. 1 Device 0   Cholecalciferol (VITAMIN D) 125 MCG (5000 UT) CAPS Take 5,000 Int'l Units/day by mouth daily.     Continuous Blood Gluc Receiver (DEXCOM G6 RECEIVER) DEVI 1 Device by Does not apply route as directed. Use with Dexcom G6 sensor and transmitter 1 each 1   Continuous Blood Gluc Sensor (DEXCOM G6 SENSOR) MISC Inject 1 applicator into the skin as directed. Change sensor every 10 days. 3 each 11   Continuous Blood Gluc Transmit (DEXCOM G6 TRANSMITTER) MISC Inject 1 Device into the skin as directed. Reuse 8 times with sensor changes. 1 each 3   doxazosin (CARDURA) 4 MG tablet Take 1 tablet (4 mg total)  by mouth daily. 90 tablet 3   empagliflozin (JARDIANCE) 25 MG TABS tablet Take 1 tablet (25 mg total) by mouth daily. 90 tablet 3   furosemide (LASIX) 20 MG tablet Take 1 tablet (20 mg total) by mouth as needed. 30 tablet 3   hydrochlorothiazide (HYDRODIURIL) 12.5 MG tablet TAKE 1 TABLET BY MOUTH  DAILY 90 tablet 3   insulin degludec (TRESIBA FLEXTOUCH) 100 UNIT/ML FlexTouch Pen Inject 25 Units into the skin daily. (Patient taking differently: Inject 24 Units into the skin daily.)     Insulin Lispro w/ Trans Port 100 UNIT/ML SOPN Inject 10-20 Units into the skin 2  (two) times daily. Before meals     Insulin Pen Needle (B-D ULTRAFINE III SHORT PEN) 31G X 8 MM MISC USE TO INJECT INSULIN 4 times daily. 360 each 3   losartan (COZAAR) 100 MG tablet TAKE 1 TABLET BY MOUTH  DAILY 90 tablet 3   metFORMIN (GLUCOPHAGE-XR) 500 MG 24 hr tablet TAKE 1 TABLET BY MOUTH 3  TIMES DAILY BEFORE MEALS 270 tablet 3   metoprolol succinate (TOPROL-XL) 50 MG 24 hr tablet TAKE 1 TABLET BY MOUTH  EVERY EVENING. TAKE WITH OR IMMEDIATELY FOLLOWING A  MEAL. 90 tablet 3   NEXLIZET 180-10 MG TABS TAKE 1 TABLET BY MOUTH  DAILY 90 tablet 1   nitroGLYCERIN (NITROSTAT) 0.4 MG SL tablet Place 1 tablet (0.4 mg total) under the tongue every 5 (five) minutes as needed for chest pain. 25 tablet 3   omeprazole (PRILOSEC) 40 MG capsule TAKE 1 CAPSULE(40 MG) BY MOUTH DAILY 90 capsule 3   ONETOUCH ULTRA test strip CHECK BLOOD SUGAR 3 TIMES  DAILY 300 strip 3   rosuvastatin (CRESTOR) 20 MG tablet TAKE 1 TABLET BY MOUTH DAILY 90 tablet 0   Semaglutide, 1 MG/DOSE, (OZEMPIC, 1 MG/DOSE,) 4 MG/3ML SOPN Inject 1 mg into the skin once a week. Start taking after finishing four weeks of 0.5 mg weekly. 3 mL 11   spironolactone (ALDACTONE) 25 MG tablet TAKE 1 TABLET BY MOUTH  DAILY 90 tablet 3   timolol (TIMOPTIC) 0.5 % ophthalmic solution Place 1 drop into both eyes 2 times daily.     insulin aspart (NOVOLOG FLEXPEN) 100 UNIT/ML FlexPen 10-20 units three times per day with meals. (Patient not taking: Reported on 12/12/2021) 6 mL 11   No current facility-administered medications for this visit.    Allergies:   Penicillins    Social History:  The patient  reports that she quit smoking about 26 years ago. Her smoking use included cigarettes. She has a 20.00 pack-year smoking history. She has never been exposed to tobacco smoke. She has never used smokeless tobacco. She reports current alcohol use. She reports that she does not use drugs.   Family History:  The patient's family history includes Alcohol abuse in  her brother and father; Breast cancer in her maternal aunt; Cervical cancer in her mother; Diabetes in an other family member; Heart disease in her brother; Hypertension in an other family member; Pancreatic cancer in her maternal uncle; Stroke in her father and paternal grandmother.    ROS:  Please see the history of present illness.    Physical Exam: Blood pressure (!) 138/90, pulse 67, height 5' 6"  (1.676 m), weight 254 lb (115.2 kg), SpO2 97 %.  HYPERTENSION CONTROL Vitals:   12/12/21 1148 12/12/21 1203  BP: (!) 158/92 (!) 138/90    The patient's blood pressure is elevated above target today.  In order to  address the patient's elevated BP: A new medication was prescribed today.      GEN:  morbidly obese female in no acute distress HEENT: Normal NECK: No JVD; No carotid bruits LYMPHATICS: No lymphadenopathy CARDIAC: RRR , no murmurs, rubs, gallops RESPIRATORY:  Clear to auscultation without rales, wheezing or rhonchi  ABDOMEN: Soft, non-tender, non-distended MUSCULOSKELETAL:  No edema; No deformity  SKIN: Warm and dry NEUROLOGIC:  Alert and oriented x 3    EKG:    December 12, 2021: Normal sinus rhythm at 67.  Sinus arrhythmia.  No ST or T wave changes.     Recent Labs: 11/27/2021: ALT 13; BNP 12.9; BUN 30; Creatinine, Ser 1.50; Hemoglobin 13.4; Platelets 193; Potassium 4.7; Sodium 137; TSH 2.500    Lipid Panel    Component Value Date/Time   CHOL 180 12/14/2020 1403   TRIG 110 12/14/2020 1403   HDL 60 12/14/2020 1403   CHOLHDL 3.0 12/14/2020 1403   CHOLHDL 4.1 01/20/2015 1003   VLDL 10 01/20/2015 1003   LDLCALC 100 (H) 12/14/2020 1403   LDLDIRECT 96 03/23/2021 0931   LDLDIRECT 223 (H) 10/13/2015 0932      Wt Readings from Last 3 Encounters:  12/12/21 254 lb (115.2 kg)  11/27/21 249 lb 9.6 oz (113.2 kg)  09/26/21 255 lb (115.7 kg)      Other studies Reviewed: Additional studies/ records that were reviewed today include: . Review of the above records  demonstrates:    ASSESSMENT AND PLAN:  1. Hypertension -   BP remains elevated ,  add cardura 4 mg Q HS    2.  Coronary artery calcifications:  Myoview showed no ischemia,  low risk .       3. Diabetes mellitus  4. Hyperlipidemia-   check lipids, alt, bmp today   5. Morbid obesity -       she is losing weight on Ozympic   6. Pulmonary sarcoidosis - further management per pulmonary   7. Back pain   Current medicines are reviewed at length with the patient today.  The patient does not have concerns regarding medicines.  The following changes have been made:    Follow up with APP in  1 year    Signed, Rebecca Moores, MD  12/12/2021 12:54 PM    [address], [place], [place]  [address] Phone: [phone]; Fax: [phone]

## 2021-12-12 ENCOUNTER — Encounter: Payer: Self-pay | Admitting: Cardiovascular Disease

## 2021-12-12 ENCOUNTER — Ambulatory Visit (INDEPENDENT_AMBULATORY_CARE_PROVIDER_SITE_OTHER): Payer: Medicare Other | Admitting: Cardiovascular Disease

## 2021-12-12 VITALS — BP 138/90 | HR 67 | Ht 66.0 in | Wt 254.0 lb

## 2021-12-12 DIAGNOSIS — E782 Mixed hyperlipidemia: Secondary | ICD-10-CM

## 2021-12-12 DIAGNOSIS — I10 Essential (primary) hypertension: Secondary | ICD-10-CM

## 2021-12-12 DIAGNOSIS — I5032 Chronic diastolic (congestive) heart failure: Secondary | ICD-10-CM

## 2021-12-12 MED ORDER — DOXAZOSIN MESYLATE 4 MG PO TABS: 4.0000 mg | ORAL_TABLET | Freq: Every day | ORAL | 3 refills | Status: DC

## 2021-12-12 NOTE — Patient Instructions (Signed)
Medication Instructions:  START Doxazosin '4mg'$  each night at bedtime *If you need a refill on your cardiac medications before your next appointment, please call your pharmacy*   Lab Work: Lipids, ALT, BMET today If you have labs (blood work) drawn today and your tests are completely normal, you will receive your results only by: [place] (if you have MyChart) OR A paper copy in the mail If you have any lab test that is abnormal or we need to change your treatment, we will call you to review the results.   Testing/Procedures: NONE   Follow-Up: At [facility], you and your health needs are our priority.  As part of our continuing mission to provide you with exceptional heart care, we have created designated Provider Care Teams.  These Care Teams include your primary Cardiologist (physician) and Advanced Practice Providers (APPs -  Physician Assistants and Nurse Practitioners) who all work together to provide you with the care you need, when you need it.  We recommend signing up for the patient portal called "MyChart".  Sign up information is provided on this After Visit Summary.  MyChart is used to connect with patients for Virtual Visits (Telemedicine).  Patients are able to view lab/test results, encounter notes, upcoming appointments, etc.  Non-urgent messages can be sent to your provider as well.   To learn more about what you can do with MyChart, go to [email or web address].    Your next appointment:   3 month(s)  The format for your next appointment:   In Person  Provider:   Christen Bame, Nicholes Rough, Ambrose Pancoast, or Richardson Dopp {    Important Information About Sugar

## 2021-12-13 LAB — LIPID PANEL
Chol/HDL Ratio: 2.6 ratio (ref 0.0–4.4)
Cholesterol, Total: 148 mg/dL (ref 100–199)
HDL: 57 mg/dL (ref >39)
LDL Chol Calc (NIH): 75 mg/dL (ref 0–99)
Triglycerides: 85 mg/dL (ref 0–149)
VLDL Cholesterol Cal: 16 mg/dL (ref 5–40)

## 2021-12-13 LAB — BASIC METABOLIC PANEL
BUN/Creatinine Ratio: 17 (ref 12–28)
BUN: 25 mg/dL (ref 8–27)
CO2: 19 mmol/L — ABNORMAL LOW (ref 20–29)
Calcium: 9.8 mg/dL (ref 8.7–10.3)
Chloride: 104 mmol/L (ref 96–106)
Creatinine, Ser: 1.48 mg/dL — ABNORMAL HIGH (ref 0.57–1.00)
Glucose: 154 mg/dL — ABNORMAL HIGH (ref 70–99)
Potassium: 5.1 mmol/L (ref 3.5–5.2)
Sodium: 140 mmol/L (ref 134–144)
eGFR: 37 mL/min/{1.73_m2} — ABNORMAL LOW (ref >59)

## 2021-12-13 LAB — ALT: ALT: 12 IU/L (ref 0–32)

## 2021-12-21 ENCOUNTER — Telehealth: Payer: Self-pay

## 2021-12-21 NOTE — Telephone Encounter (Signed)
INFORMED PT HER MEDICATION FROM NOVO NORDISK IS READY FOR PICKUP.  4 BOXES OF NOVOLOG FLEXPENS (+4 BOXES PEN NEEDLES) ARE LABELED AND READY FOR PICKUP IN MED ROOM.

## 2021-12-21 NOTE — Telephone Encounter (Signed)
4 boxes pen needles for pt as well

## 2021-12-27 ENCOUNTER — Ambulatory Visit: Payer: Medicare Other | Admitting: Emergency Medicine

## 2021-12-30 ENCOUNTER — Other Ambulatory Visit: Payer: Self-pay | Admitting: Cardiovascular Disease

## 2022-01-05 ENCOUNTER — Other Ambulatory Visit: Payer: Self-pay | Admitting: Family Medicine

## 2022-01-05 DIAGNOSIS — G8929 Other chronic pain: Secondary | ICD-10-CM

## 2022-01-05 DIAGNOSIS — I10 Essential (primary) hypertension: Secondary | ICD-10-CM

## 2022-01-16 ENCOUNTER — Other Ambulatory Visit: Payer: Self-pay | Admitting: Family Medicine

## 2022-01-16 DIAGNOSIS — G8929 Other chronic pain: Secondary | ICD-10-CM

## 2022-02-07 ENCOUNTER — Ambulatory Visit: Payer: Medicare Other | Admitting: Emergency Medicine

## 2022-02-07 ENCOUNTER — Encounter: Payer: Self-pay | Admitting: Emergency Medicine

## 2022-02-07 VITALS — BP 130/70 | HR 76 | Ht 66.0 in | Wt 260.4 lb

## 2022-02-07 DIAGNOSIS — D869 Sarcoidosis, unspecified: Secondary | ICD-10-CM | POA: Diagnosis not present

## 2022-02-07 DIAGNOSIS — G4733 Obstructive sleep apnea (adult) (pediatric): Secondary | ICD-10-CM | POA: Diagnosis not present

## 2022-02-07 NOTE — Patient Instructions (Signed)
Please continue CPAP every night as you have been using it. We will repeat your pulmonary function testing and your CT scan of the chest in January 2024 to follow your sarcoidosis. Temporarily increase your omeprazole to 40 mg twice a day for 2 to 3 weeks.  Then go back to using it once a day.  Keep track of whether this impacts your cough. Follow with Dr. Lamonte Sakai in January after your testing so we can review together.

## 2022-02-07 NOTE — Progress Notes (Signed)
HPI:   ROV 09/26/21 --76 year old woman with a history of sarcoidosis, associated obstructive lung disease, obstructive sleep apnea.  She also has chronic cough with GERD and allergic rhinitis.  She has had a difficult time tolerating and staying on CPAP.  She reports today that she was treated with abx in 04/2021 for a PNA. Repeat CXR 06/06/2021 was clear. Not on scheduled BDs, she has albuterol that she uses rarely. She describes poor sleep, frequent awakenings. Family has heard snoring and wheeze at night. Her machine is not operational.  Her last PFT 12/30/2019 showed hyperinflation without obstruction.   ROV 02/07/2022 --Rebecca Hicks is 76 with a history of sarcoidosis and associated obstructive lung disease.  This is managed on albuterol as needed.  She also has obstructive sleep apnea, chronic cough in the setting of GERD and allergic rhinitis.  She returns today for follow-up.  Today she reports that she got a new CPAP machine, has been using reliably. She had to turn up the humidity. Good mask fit, minimal leakage. She has had some increased cough since July, can happen at random, sometimes at night. Non-productive. No increase in nasal gtt or congestion. She can sometimes have breakthrough reflux, but usually well managed on omeprazole 40. She is on Ozempic, had nausea and some emesis when she   CPAP compliance data 9/17-10/16/2023 reviewed.  This shows that she has used the device for greater than 4 hours on 80% of the nights in this interval.  She is set on 10 cmH2O.  She has good control of events, minimal leak.   Exam:  Vitals:   02/07/22 1132  BP: 130/70  Pulse: 76  SpO2: 95%  Weight: 260 lb 6.4 oz (118.1 kg)  Height: '5\' 6"'$  (1.676 m)     Gen: Pleasant, obese, in no distress,  normal affect, obese   ENT: No lesions,  mouth clear,  oropharynx clear, no postnasal drip  Neck: No JVD, no Stridor  Lungs: No use of accessory muscles, somewhat distant, no crackles or wheezing    Cardiovascular: RRR, heart sounds normal, no murmur or gallops, no peripheral edema  Musculoskeletal: No deformities, no cyanosis or clubbing  Neuro: alert, non focal  Skin: Some old hyperpigmentation but no evidence of active sarcoid rash    Obstructive sleep apnea She was able to get her new CPAP machine and is using it reliably.  She gets good clinical benefit with better sleep quality, longer sleep duration, more energy during the day.  She is having some difficulty with the humidity level and is going to talk to [facility] about this so she can reset the humidity to 6.  Plan to continue current device and current settings 10 cmH2O.  SARCOIDOSIS, PULMONARY She is having more cough, started after an acute bronchitis in July but has been sustained.  Discussed with her the possibility of flaring obstructive lung disease, sarcoidosis.  She does not have wheezing on exam today so I think I can defer steroids at this time.  I will try treating the potential underlying contributors to cough including GERD.  Increase her omeprazole temporarily to see if she gets benefit.  Also we will repeat her CT scan of the chest and her pulmonary function testing to ensure no evidence for active sarcoidosis as a cause for her increased symptoms.  Follow-up after the CT and PFT are done.    Baltazar Apo, MD, PhD 02/07/2022, 1:39 PM [facility] [phone] or if no answer [phone]

## 2022-02-07 NOTE — Assessment & Plan Note (Signed)
She was able to get her new CPAP machine and is using it reliably.  She gets good clinical benefit with better sleep quality, longer sleep duration, more energy during the day.  She is having some difficulty with the humidity level and is going to talk to [facility] about this so she can reset the humidity to 6.  Plan to continue current device and current settings 10 cmH2O.

## 2022-02-07 NOTE — Assessment & Plan Note (Signed)
She is having more cough, started after an acute bronchitis in July but has been sustained.  Discussed with her the possibility of flaring obstructive lung disease, sarcoidosis.  She does not have wheezing on exam today so I think I can defer steroids at this time.  I will try treating the potential underlying contributors to cough including GERD.  Increase her omeprazole temporarily to see if she gets benefit.  Also we will repeat her CT scan of the chest and her pulmonary function testing to ensure no evidence for active sarcoidosis as a cause for her increased symptoms.  Follow-up after the CT and PFT are done.

## 2022-02-26 ENCOUNTER — Other Ambulatory Visit (HOSPITAL_COMMUNITY): Payer: Self-pay

## 2022-02-27 NOTE — Telephone Encounter (Signed)
Left message informing patient we still have a novo nordisk shipment ready for pickup.   Pen needles on counter & insulin in fridge.

## 2022-03-01 NOTE — Telephone Encounter (Signed)
Medication given to patient

## 2022-03-08 ENCOUNTER — Other Ambulatory Visit: Payer: Self-pay | Admitting: Cardiovascular Disease

## 2022-03-08 DIAGNOSIS — E785 Hyperlipidemia, unspecified: Secondary | ICD-10-CM

## 2022-03-14 ENCOUNTER — Ambulatory Visit: Payer: Medicare Other

## 2022-03-18 NOTE — Progress Notes (Deleted)
Office Visit    Patient Name: Rebecca Hicks Date of Encounter: 03/18/2022  Primary Care Provider:  Zenia Resides, MD Primary Cardiologist:  Rebecca Moores, MD Primary Electrophysiologist: None  Chief Complaint    Rebecca Hicks is a 76 y.o. female with PMH of diastolic CHF, HTN, DM type II, HLD, CKD stage III, morbid , pulmonary sarcoidosis who presents today for 46-monthfollow-up of CHF.  Past Medical History    Past Medical History:  Diagnosis Date   Abdominal pain 12/05/2011   Abdominal pain, left upper quadrant 12/03/2012   Acute on chronic renal failure ([facility] 12/25/2012   Allergy    SEASONAL   Arthritis    Arthropathy 11/24/2016   cervical spine   Ataxia 12/15/2015   Atypical chest pain 03/28/2016   Cataract    BILATERAL-REMOVED   CERUMEN IMPACTION, RIGHT 01/31/2010   Qualifier: Diagnosis of  By: TDianah Hicks, Rebecca     Chest tightness 02/23/2014   Chronic kidney disease (CKD), stage II (mild) 03/05/2013   Looks like baseline creat = 1.2 -1.3.  Watch for overdiuresis.    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 - She was fitted with a Small size [employer] Full Face Mask Simplus mask and heated humidification.     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, POSITIVE 02/07/2007   Annotation: with clear CXR mid 1990's Qualifier: History of  By: JWynetta EmeryRN, Hicks     TOBACCO USE, QUIT 02/07/2007   Qualifier: Diagnosis of  By: HAndria Hicks, Rebecca     Tremor of both hands 12/15/2015   U R I 03/15/2010   Qualifier: Diagnosis of  By: HAndria Hicks, WGwyndolyn Hicks    Past Surgical History:  Procedure Laterality Date   ABDOMINAL HYSTERECTOMY  APPENDECTOMY     CATARACT EXTRACTION, BILATERAL Bilateral 03/2010, 04/2010   COLONOSCOPY  2011   POLYPECTOMY  2011   polyps, hems    SPINE SURGERY     lumbar laminectomy X 2   TONSILLECTOMY      Allergies  Allergies  Allergen Reactions   Penicillins Rash and Other (See Comments)    Has patient had a PCN reaction causing immediate rash, facial/tongue/throat swelling, SOB or lightheadedness with hypotension: Yes Has patient had a PCN reaction causing severe rash involving mucus membranes or skin necrosis: No Has patient had a PCN reaction that required hospitalization: No Has patient had a PCN reaction occurring within the last 10 years: Yes If all of the above  answers are "NO", then may proceed with Cephalosporin use.     History of Present Illness    Rebecca Hicks  is a 76 year old female with the above mention past medical history who presents today for 76-monthfollow-up of CHF.  Ms. PPhineas Douglaswas initially seen by Dr. NAcie Fredricksonin 2015 for complaint of chest pain.  She underwent nuclear stress test that was low risk and without ischemia.  She was seen in 2017 by Rebecca Hicks with complaint of chest pain and shortness of breath.  She noted chest pain as being substernal throbbing with pain under the left scapula and right scapula.  She completed a stress Myoview that was normal and was started on Pepcid.  Cardiac CT was completed 08/2016 that showed coronary calcifications in the left main and aortic atherosclerosis.  2D echo was completed in 2019 with EF of 55-60%, and no RWMA with grade 1 DD and no valvular abnormalities.  She underwent Lexiscan Myoview in 2021 that was normal with no ischemia and low risk.  She was referred to lipid clinic due to elevated LDL.  She was last seen on 11/2021 for follow-up.  During visit patient's blood pressure was elevated initially and was started on Cardura 4 mg.  Since last being seen in the office patient reports***.  Patient denies chest pain, palpitations, dyspnea, PND, orthopnea, nausea, vomiting, dizziness, syncope, edema, weight gain, or early satiety.   ***Notes: Entresto if BP is elevated? Cardura ( doxazosin) was added to b/p medications last visit Home Medications    Current Outpatient Medications  Medication Sig Dispense Refill   acetaminophen (TYLENOL) 650 MG CR tablet Take 650 mg by mouth every 8 (eight) hours as needed.      albuterol (PROAIR HFA) 108 (90 Base) MCG/ACT inhaler Inhale 2 puffs into the lungs every 6 (six) hours as needed for wheezing or shortness of breath. 1 each 2   amLODipine (NORVASC) 10 MG tablet TAKE 1 TABLET BY MOUTH  DAILY 90 tablet 3   aspirin 81 MG tablet Take 81 mg by  mouth daily.     Bempedoic Acid-Ezetimibe (NEXLIZET) 180-10 MG TABS TAKE 1 TABLET BY MOUTH DAILY 90 tablet 2   Blood Glucose Monitoring Suppl (ONE TOUCH ULTRA 2) w/Device KIT Test blood sugar three times per day. 1 each 0   Blood Pressure Monitoring (BLOOD PRESSURE KIT) DEVI 1 Act by Does not apply route daily. 1 Device 0   Cholecalciferol (VITAMIN D) 125 MCG (5000 UT) CAPS Take 5,000 Int'l Units/day by mouth daily.     Continuous Blood Gluc Receiver (DEXCOM G6 RECEIVER) DEVI 1 Device by Does not apply route as directed. Use with Dexcom G6 sensor and transmitter 1 each 1   Continuous Blood Gluc Sensor (DEXCOM  G6 SENSOR) MISC Inject 1 applicator into the skin as directed. Change sensor every 10 days. 3 each 11   Continuous Blood Gluc Transmit (DEXCOM G6 TRANSMITTER) MISC Inject 1 Device into the skin as directed. Reuse 8 times with sensor changes. 1 each 3   doxazosin (CARDURA) 4 MG tablet Take 1 tablet (4 mg total) by mouth daily. 90 tablet 3   empagliflozin (JARDIANCE) 25 MG TABS tablet Take 1 tablet (25 mg total) by mouth daily. 90 tablet 3   furosemide (LASIX) 20 MG tablet Take 1 tablet (20 mg total) by mouth as needed. 30 tablet 3   hydrochlorothiazide (HYDRODIURIL) 12.5 MG tablet TAKE 1 TABLET BY MOUTH  DAILY 90 tablet 3   insulin degludec (TRESIBA FLEXTOUCH) 100 UNIT/ML FlexTouch Pen Inject 25 Units into the skin daily. (Patient taking differently: Inject 24 Units into the skin daily.)     Insulin Lispro w/ Trans Port 100 UNIT/ML SOPN Inject 10-20 Units into the skin 2 (two) times daily. Before meals     Insulin Pen Needle (B-D ULTRAFINE III SHORT PEN) 31G X 8 MM MISC USE TO INJECT INSULIN 4 times daily. 360 each 3   losartan (COZAAR) 100 MG tablet TAKE 1 TABLET BY MOUTH  DAILY 90 tablet 3   metFORMIN (GLUCOPHAGE-XR) 500 MG 24 hr tablet TAKE 1 TABLET BY MOUTH 3  TIMES DAILY BEFORE MEALS 270 tablet 3   metoprolol succinate (TOPROL-XL) 50 MG 24 hr tablet TAKE 1 TABLET BY MOUTH IN THE  EVENING  WITH OR IMMEDIATELY  FOLLOWING A MEAL 90 tablet 3   nitroGLYCERIN (NITROSTAT) 0.4 MG SL tablet Place 1 tablet (0.4 mg total) under the tongue every 5 (five) minutes as needed for chest pain. 25 tablet 3   omeprazole (PRILOSEC) 40 MG capsule TAKE 1 CAPSULE(40 MG) BY MOUTH DAILY 90 capsule 3   ONETOUCH ULTRA test strip CHECK BLOOD SUGAR 3 TIMES  DAILY 300 strip 3   rosuvastatin (CRESTOR) 20 MG tablet TAKE 1 TABLET BY MOUTH DAILY 90 tablet 3   Semaglutide, 1 MG/DOSE, (OZEMPIC, 1 MG/DOSE,) 4 MG/3ML SOPN Inject 1 mg into the skin once a week. Start taking after finishing four weeks of 0.5 mg weekly. 3 mL 11   spironolactone (ALDACTONE) 25 MG tablet TAKE 1 TABLET BY MOUTH  DAILY 90 tablet 3   timolol (TIMOPTIC) 0.5 % ophthalmic solution Place 1 drop into both eyes 2 times daily.     No current facility-administered medications for this visit.     Review of Systems  Please see the history of present illness.    (+)*** (+)***  All other systems reviewed and are otherwise negative except as noted above.  Physical Exam    Wt Readings from Last 3 Encounters:  02/07/22 260 lb 6.4 oz (118.1 kg)  12/12/21 254 lb (115.2 kg)  11/27/21 249 lb 9.6 oz (113.2 kg)   LJ:QGBEE were no vitals filed for this visit.,There is no height or weight on file to calculate BMI.  Constitutional:      Appearance: Healthy appearance. Not in distress.  Neck:     Vascular: JVD normal.  Pulmonary:     Effort: Pulmonary effort is normal.     Breath sounds: No wheezing. No rales. Diminished in the bases Cardiovascular:     Normal rate. Regular rhythm. Normal S1. Normal S2.      Murmurs: There is no murmur.  Edema:    Peripheral edema absent.  Abdominal:     Palpations: Abdomen is  soft non tender. There is no hepatomegaly.  Skin:    General: Skin is warm and dry.  Neurological:     General: No focal deficit present.     Mental Status: Alert and oriented to person, place and time.     Cranial Nerves: Cranial  nerves are intact.  EKG/LABS/Other Studies Reviewed    ECG personally reviewed by me today - ***  Risk Assessment/Calculations:   {Does this patient have ATRIAL FIBRILLATION?:[phone]}        Lab Results  Component Value Date   WBC 5.8 11/27/2021   HGB 13.4 11/27/2021   HCT 41.3 11/27/2021   MCV 88 11/27/2021   PLT 193 11/27/2021   Lab Results  Component Value Date   CREATININE 1.48 (H) 12/12/2021   BUN 25 12/12/2021   NA 140 12/12/2021   K 5.1 12/12/2021   CL 104 12/12/2021   CO2 19 (L) 12/12/2021   Lab Results  Component Value Date   ALT 12 12/12/2021   AST 19 11/27/2021   ALKPHOS 75 11/27/2021   BILITOT 0.4 11/27/2021   Lab Results  Component Value Date   CHOL 148 12/12/2021   HDL 57 12/12/2021   LDLCALC 75 12/12/2021   LDLDIRECT 96 03/23/2021   TRIG 85 12/12/2021   CHOLHDL 2.6 12/12/2021    Lab Results  Component Value Date   HGBA1C 8.0 (A) 11/27/2021    Assessment & Plan    1.  Diastolic CHF: -2D echo completed 01/2018 with EF of 55-60% and no RWMA with grade 1 DD -Continue current GDMT with Aldactone 25 mg, Toprol XL 50 mg daily, Jardiance 25 mg, Lasix 20 mg  2.  Nonobstructive CAD -Coronary CT completed with atherosclerosis noted and aorta and left circumflex -Continue GDMT with ASA 81 mg and Nexlizet 180-10 mg, and Crestor 20 mg  3.  Essential hypertension -Blood pressure today was*** -Continue amlodipine 10 mg, doxazosin 4 mg, HCTZ 12.5 mg, losartan 100 mg  4.  Hyperlipidemia -Patient's last LDL was*** -Continue Nexlizet and Crestor as noted above  5.  Morbid obesity -Patient's BMI is***      Disposition: Follow-up with Rebecca Moores, MD or APP in *** months {Are you ordering a CV Procedure (e.g. stress test, cath, DCCV, TEE, etc)?   Press F2        :[id number]}   Medication Adjustments/Labs and Tests Ordered: Current medicines are reviewed at length with the patient today.  Concerns regarding medicines are outlined above.    Signed, Mable Fill, Marissa Nestle, NP 03/18/2022, 10:11 AM [facility]  Note:  This document was prepared using Dragon voice recognition software and may include unintentional dictation errors.

## 2022-03-19 ENCOUNTER — Ambulatory Visit: Payer: Medicare Other | Admitting: Nurse Practitioner

## 2022-03-19 DIAGNOSIS — I10 Essential (primary) hypertension: Secondary | ICD-10-CM

## 2022-03-26 ENCOUNTER — Ambulatory Visit (INDEPENDENT_AMBULATORY_CARE_PROVIDER_SITE_OTHER): Payer: Medicare Other | Admitting: Pharmacist

## 2022-03-26 ENCOUNTER — Encounter: Payer: Self-pay | Admitting: Family Medicine

## 2022-03-26 ENCOUNTER — Ambulatory Visit: Payer: Medicare Other | Admitting: Family Medicine

## 2022-03-26 ENCOUNTER — Encounter: Payer: Self-pay | Admitting: Pharmacist

## 2022-03-26 VITALS — BP 150/78 | HR 84 | Wt 264.2 lb

## 2022-03-26 DIAGNOSIS — E1122 Type 2 diabetes mellitus with diabetic chronic kidney disease: Secondary | ICD-10-CM

## 2022-03-26 DIAGNOSIS — D869 Sarcoidosis, unspecified: Secondary | ICD-10-CM

## 2022-03-26 DIAGNOSIS — E1140 Type 2 diabetes mellitus with diabetic neuropathy, unspecified: Secondary | ICD-10-CM

## 2022-03-26 DIAGNOSIS — Z23 Encounter for immunization: Secondary | ICD-10-CM

## 2022-03-26 DIAGNOSIS — N1832 Chronic kidney disease, stage 3b: Secondary | ICD-10-CM | POA: Diagnosis not present

## 2022-03-26 DIAGNOSIS — G4733 Obstructive sleep apnea (adult) (pediatric): Secondary | ICD-10-CM

## 2022-03-26 DIAGNOSIS — S40811A Abrasion of right upper arm, initial encounter: Secondary | ICD-10-CM

## 2022-03-26 DIAGNOSIS — Z794 Long term (current) use of insulin: Secondary | ICD-10-CM

## 2022-03-26 DIAGNOSIS — I10 Essential (primary) hypertension: Secondary | ICD-10-CM | POA: Diagnosis not present

## 2022-03-26 LAB — POCT GLYCOSYLATED HEMOGLOBIN (HGB A1C): HbA1c, POC (controlled diabetic range): 7.9 % — AB (ref 0.0–7.0)

## 2022-03-26 MED ORDER — OZEMPIC (2 MG/DOSE) 8 MG/3ML ~~LOC~~ SOPN: 2.0000 mg | PEN_INJECTOR | SUBCUTANEOUS | Status: AC

## 2022-03-26 NOTE — Patient Instructions (Signed)
You will get a tetanus shot today. You still need in the order I would recommend Flu COVID booster Shingles - two shot at drug store 2-6 months apart.   When you come in for a nurse visit for your shots, have them check your BP.  Please make sure you are taking your evening meds.   I will let Dr. Valentina Lucks talk about adjusting diabetes meds I think it is prudent to get the sarcoidosis testing in January.   See me one more time before I retire in mid-Feb.   I will call with test results

## 2022-03-26 NOTE — Addendum Note (Signed)
Addended by: Leavy Cella on: 03/26/2022 12:26 PM   Modules accepted: Orders

## 2022-03-26 NOTE — Progress Notes (Signed)
S:     Chief Complaint  Patient presents with   Medication Management    Diabetes, HTN   76 y.o. female who presents for diabetes evaluation, education, and management.  Patient was referred and last seen by Primary Care Provider, Dr. Andria Frames, earlier this AM.   At last pharmacist visit in February, patient was doing well with her diabetes.   Today, patient arrives in good spirits and presents with assistance of a cane.    Current diabetes medications include: Jardiance (emplagliflozin) '25mg'$  daily, Ozempic (semaglutide) '1mg'$  weekly, Tresiba (insulin degludec) 24 units daily and Humalog (insulin lispro) 10-18 units prior to 1-2 meals per day, typically 2 meals per day but it is also possible that her use is with 1 meal daily.  Current hypertension medications include: amlodipine'10mg'$ , doxazosin '4mg'$  daily, HCTZ 12.'5mg'$  daily, losartan '100mg'$  daily, metoprolol succinate '50mg'$  daily, and spironolactone '25mg'$  daily.  Current hyperlipidemia medications include: Nexlizet (ezetimibe/bempedoic acid) and rosuvastatin '20mg'$  daily.   Patient denies perfect adherence with medications, reports missing evening blood pressure  medications mulitple times per week, on average.  Patient denies hypoglycemic events.  Reported home blood sugars have been good and enjoys having her DEXCOM:    Patient-reported exercise habits: limited   O:   Review of Systems  All other systems reviewed and are negative.   Physical Exam Constitutional:      Appearance: Normal appearance. She is obese.  Pulmonary:     Effort: Pulmonary effort is normal.  Neurological:     Mental Status: She is alert.  Psychiatric:        Mood and Affect: Mood normal.        Behavior: Behavior normal.        Thought Content: Thought content normal.        Judgment: Judgment normal.      CGM Download:  % Time CGM is active: 88.1% Average Glucose: 184 mg/dL Glucose Management Indicator: 7.7  Glucose Variability: 25.1% (goal  <36%) Time in Goal:  - Time in range 70-180: 52% - Time above range: 48% with 38% in the 180-250 range and only 10%> 250 - Time below range: 0% Observed patterns: possible elevated related to "snacking" or not taking adequate prandial insulin.   Lab Results  Component Value Date   HGBA1C 7.9 (A) 03/26/2022   Lipid Panel     Component Value Date/Time   CHOL 148 12/12/2021 1222   TRIG 85 12/12/2021 1222   HDL 57 12/12/2021 1222   CHOLHDL 2.6 12/12/2021 1222   CHOLHDL 4.1 01/20/2015 1003   VLDL 10 01/20/2015 1003   LDLCALC 75 12/12/2021 1222   LDLDIRECT 96 03/23/2021 0931   LDLDIRECT 223 (H) 10/13/2015 0932    A/P: Diabetes longstanding and currently under fair control. Patient is able to verbalize appropriate hypoglycemia management plan. Medication adherence appears good. Control is suboptimal due to lack of administration with meals when pre-meal blood sugar is < '150mg'$ /dl. -Continued basal insulin Tresiba (insulin degludec) at 24 units once daily.  -Adjusted dose of rapid insulin Humalog (insulin lispro) to the following "sliding scale": All based on pre-prandial reading AND expected carbohydrate intake 120-150 take 8-10 units 151-200 take 10-15 units  201-250 take 15-18 units -Adjusted dose of GLP-1 Ozempic (semaglutide) by gradual titration.  Patient willing to start with next dose '2mg'$  MINUS 10 clicks, then increase to 1 less backward click per week based on your blood sugar and side effects.  Goal dose of Ozempic is '2mg'$  weekly or  highest tolerable dose > '1mg'$  weekly.  -Continued SGLT2-I Jardiance (empagliflozin) at '25mg'$  daily. -Continued metformin as previous. -Patient educated on purpose, proper use, and potential adverse effects.  -Extensively discussed pathophysiology of diabetes, recommended lifestyle interventions, dietary effects on blood sugar control.  -Counseled on s/sx of and management of hypoglycemia.    Hypertension longstanding currently not at goal in  office, but will return for re-evaluation in near future. Blood pressure goal of <130 mmHg. Blood pressure control is suboptimal due to adherence issues related to falling asleep prior to taking evening medication doses.  Medication adherence may be improved by moving all PM medications to "with evening meal".   -Patient agreed to take PM medications with evening meal -Also discussed that her basal and bolus insulins could be administered with her largest evening meal if that was more convenient.    Written patient instructions provided. Patient verbalized understanding of treatment plan.  Total time in face to face counseling 30 minutes.    Follow-up:  Pharmacist PRN - patient to schedule.

## 2022-03-26 NOTE — Progress Notes (Signed)
Reviewed: I agree with Dr. Koval's documentation and management. 

## 2022-03-26 NOTE — Assessment & Plan Note (Signed)
Diabetes longstanding and currently under fair control. Patient is able to verbalize appropriate hypoglycemia management plan. Medication adherence appears good. Control is suboptimal due to lack of administration with meals when pre-meal blood sugar is < '150mg'$ /dl. -Continued basal insulin Tresiba (insulin degludec) at 24 units once daily.  -Adjusted dose of rapid insulin Humalog (insulin lispro) to the following "sliding scale": All based on pre-prandial reading AND expected carbohydrate intake 120-150 take 8-10 units 151-200 take 10-15 units  201-250 take 15-18 units -Adjusted dose of GLP-1 Ozempic (semaglutide) by gradual titration.  Patient willing to start with next dose '2mg'$  MINUS 10 clicks, then increase to 1 less backward click per week based on your blood sugar and side effects.  Goal dose of Ozempic is '2mg'$  weekly or highest tolerable dose > '1mg'$  weekly.  -Continued SGLT2-I Jardiance (empagliflozin) at '25mg'$  daily. -Continued metformin as previous. -Patient educated on purpose, proper use, and potential adverse effects.  -Extensively discussed pathophysiology of diabetes, recommended lifestyle interventions, dietary effects on blood sugar control.  -Counseled on s/sx of and management of hypoglycemia.

## 2022-03-26 NOTE — Assessment & Plan Note (Signed)
Hypertension longstanding currently not at goal in office, but will return for re-evaluation in near future. Blood pressure goal of <130 mmHg. Blood pressure control is suboptimal due to adherence issues related to falling asleep prior to taking evening medication doses.  Medication adherence may be improved by moving all PM medications to "with evening meal".   -Patient agreed to take PM medications with evening meal -Also discussed that her basal and bolus insulins could be administered with her largest evening meal if that was more convenient.

## 2022-03-26 NOTE — Patient Instructions (Signed)
Nice to see you today!  Please take your "evening" medication with your evening meal.  I hope this helps with your blood pressure as you will miss many fewer medication doses.   Using your Ozempic '2mg'$  pen - start with next dose '2mg'$  MINUS 10 clicks  Then increase to 1 less click per week based on your blood sugar and side effects.   Humalog Take as follows  120-150 take 8-10 units 151-200 take 10-15 units based on expected carbohydrate intake 201-250 take 15-18 units  Next visit. TBD.

## 2022-03-27 ENCOUNTER — Encounter: Payer: Self-pay | Admitting: Family Medicine

## 2022-03-27 NOTE — Assessment & Plan Note (Signed)
Improved now that she is consistently using CPAP

## 2022-03-27 NOTE — Progress Notes (Signed)
    SUBJECTIVE:   CHIEF COMPLAINT / HPI:   Multiple issues: Cough x months.  By my history, this is most consistent with a series of back to back respiratory infection.  Most recently, she was improving, exposed to both Flu and COVID, and worsened.  Now cough only.  No fever or SOB.  Has known sarcoidosis and has seen pulm.  They also doubt sarcoid flare.  They plan testing in January.  Has also recently seen cards. HBP   Elevated this visit. Missed a few doses of antihypertensive - especially evening meds.  No CP.  Due for a UAC Sleep apnea.  Back on CPAP consistently and she feels much improved. Diabetes.  Control suboptimal.  Will see Dr. Valentina Lucks after me.  Foot exam today. HPDP, Due for flu, covid and shingles.  Wants to delay because of current illness.    OBJECTIVE:   BP (!) 150/78 (BP Location: Right Arm, Cuff Size: Large)   Pulse 84   Wt 264 lb 3.2 oz (119.8 kg)   SpO2 98%   BMI 42.64 kg/m   Lungs clear Cardiac RRR without m or g Bilateral 1+ edema. Healing abrasion of Right forearm. Foot exam done  ASSESSMENT/PLAN:   SARCOIDOSIS, PULMONARY I believe this is chronic and stable. Pulm is evaluating.  Obstructive sleep apnea Improved now that she is consistently using CPAP  Essential hypertension We discussed strategies for remembering evening meds.  When she returns for immunizations, nurse will also check BP.  Abrasion of right arm Tetanus shot today.  Diabetic neuropathy, type II diabetes mellitus Poor control.  Per Dr. Valentina Lucks.       Zenia Resides, [place]

## 2022-03-27 NOTE — Assessment & Plan Note (Signed)
Tetanus shot today. 

## 2022-03-27 NOTE — Assessment & Plan Note (Signed)
We discussed strategies for remembering evening meds.  When she returns for immunizations, nurse will also check BP.

## 2022-03-27 NOTE — Assessment & Plan Note (Signed)
I believe this is chronic and stable. Pulm is evaluating.

## 2022-03-27 NOTE — Assessment & Plan Note (Signed)
Poor control.  Per Dr. Valentina Lucks.

## 2022-04-03 ENCOUNTER — Ambulatory Visit: Payer: Medicare Other

## 2022-04-06 ENCOUNTER — Telehealth: Payer: Self-pay

## 2022-04-06 NOTE — Telephone Encounter (Signed)
Received notification from [facility] regarding RE-ENROLLMENT approval for [place] Patient assistance approved from 04/05/22 to 04/23/23.  MEDICATION WILL SHIP TO OFFICE  Phone: [phone]

## 2022-04-10 ENCOUNTER — Telehealth: Payer: Self-pay | Admitting: Family Medicine

## 2022-04-10 ENCOUNTER — Ambulatory Visit (INDEPENDENT_AMBULATORY_CARE_PROVIDER_SITE_OTHER): Payer: Medicare Other

## 2022-04-10 DIAGNOSIS — Z23 Encounter for immunization: Secondary | ICD-10-CM | POA: Diagnosis not present

## 2022-04-10 DIAGNOSIS — M5417 Radiculopathy, lumbosacral region: Secondary | ICD-10-CM

## 2022-04-10 NOTE — Telephone Encounter (Signed)
Patient walked in requesting refill of:  Name of Medication(s):  Duloxetine Last date of OV:  03/26/22 Pharmacy:  Same   Will route refill request to Clinic RN.  Discussed with patient policy to call pharmacy for future refills.  Also, discussed refills may take up to 48 hours to approve or deny.  Creig Hines

## 2022-04-10 NOTE — Progress Notes (Signed)
Patient presents to nurse clinic for flu vaccination. Administered in LD, site unremarkable, tolerated injection well.   Meghan Tiemann C Averie Hornbaker, RN   

## 2022-04-10 NOTE — Telephone Encounter (Signed)
Not on home medication list and has not been prescribed since 2022.  Will let PCP returning on Thursday see if appropriate. Yehuda Savannah MD

## 2022-04-10 NOTE — Telephone Encounter (Signed)
Pt informed of waiting for PCP to return beforre refill is sent to pharmacy. Ottis Stain, CMA

## 2022-04-12 MED ORDER — DULOXETINE HCL 60 MG PO CPEP: 60.0000 mg | ORAL_CAPSULE | Freq: Every day | ORAL | 3 refills | Status: DC

## 2022-04-12 NOTE — Telephone Encounter (Signed)
Pt informed. Chozen Latulippe T Syrita Dovel, CMA  

## 2022-04-12 NOTE — Telephone Encounter (Signed)
Refill appropriate.  Sent.

## 2022-04-19 ENCOUNTER — Encounter: Payer: Self-pay | Admitting: Family Medicine

## 2022-04-19 ENCOUNTER — Ambulatory Visit: Payer: Medicare Other | Admitting: Family Medicine

## 2022-04-19 VITALS — BP 122/64 | HR 82 | Temp 98.2°F | Ht 66.0 in | Wt 250.2 lb

## 2022-04-19 DIAGNOSIS — R059 Cough, unspecified: Secondary | ICD-10-CM | POA: Diagnosis not present

## 2022-04-19 DIAGNOSIS — J208 Acute bronchitis due to other specified organisms: Secondary | ICD-10-CM

## 2022-04-19 LAB — POCT INFLUENZA A/B
Influenza A, POC: NEGATIVE
Influenza B, POC: NEGATIVE

## 2022-04-19 NOTE — Progress Notes (Signed)
    SUBJECTIVE:   CHIEF COMPLAINT / HPI:  Chief Complaint  Patient presents with   Nasal Congestion    Patient started feeling ill 2 days ago with productive cough, sore throat, congestion, chills, myalgias, fatigue. She tried [place]. Feeling a little better today, eating and drinking OK. Denies fever, chest pain, SOB. No recent sick contacts. She reports her sister is immunocompromised (kidney transplant) and lives with her.  She is concerned about possible pneumonia.  PERTINENT  PMH / PSH: T2DM, HTN, OHS, OSA, pulmonary sarcoidosis, CKD stage III  Patient Care Team: Zenia Resides, MD as PCP - General Nahser, Wonda Cheng, MD as PCP - Cardiology (Cardiology) Almedia Balls, MD as Consulting Physician (Orthopedic Surgery)   OBJECTIVE:   BP 122/64   Pulse 82   Temp 98.2 F (36.8 C) (Oral)   Ht '5\' 6"'$  (1.676 m)   Wt 250 lb 4 oz (113.5 kg)   SpO2 94%   BMI 40.39 kg/m   Physical Exam Constitutional:      General: She is not in acute distress. HENT:     Head: Normocephalic and atraumatic.     Nose: Congestion present.     Mouth/Throat:     Mouth: Mucous membranes are moist.     Pharynx: Oropharynx is clear. No oropharyngeal exudate or posterior oropharyngeal erythema.  Eyes:     General: No scleral icterus.       Right eye: No discharge.        Left eye: No discharge.     Conjunctiva/sclera: Conjunctivae normal.  Cardiovascular:     Rate and Rhythm: Normal rate and regular rhythm.     Heart sounds: Normal heart sounds.  Pulmonary:     Effort: Pulmonary effort is normal. No respiratory distress.     Breath sounds: Normal breath sounds.  Musculoskeletal:     Cervical back: Neck supple.  Lymphadenopathy:     Cervical: No cervical adenopathy.  Neurological:     Mental Status: She is alert.         04/19/2022   11:09 AM  Depression screen PHQ 2/9  Decreased Interest 0  Down, Depressed, Hopeless 0  PHQ - 2 Score 0  Altered sleeping 1  Tired, decreased energy 2   Change in appetite 1  Feeling bad or failure about yourself  0  Trouble concentrating 1  Moving slowly or fidgety/restless 0  Suicidal thoughts 0  PHQ-9 Score 5     {Show previous vital signs (optional):23777}    ASSESSMENT/PLAN:   Viral bronchitis Viral prodrome of symptoms ongoing for 2 days with productive cough likely has some bronchitis which is most likely viral.  Low suspicion for bacterial infection such as pneumonia at this time based on exam. - flu negative - Covid test pending - supportive care, can try guaifenesin, steam, and/or humidifier  Return if symptoms worsen or fail to improve.   Zola Button, MD [place]

## 2022-04-19 NOTE — Patient Instructions (Addendum)
It was nice seeing you today!  You can try Mucinex for congestion.  I would recommend trying some steam and/or a humidifier to help with congestion.  Make sure to stay hydrated.  Let us know if symptoms are getting worse or you are developing shortness of breath.  Stay well, Zola Button, MD [place] [phone]  --  Make sure to check out at the front desk before you leave today.  Please arrive at least 15 minutes prior to your scheduled appointments.  If you had blood work today, I will send you a MyChart message or a letter if results are normal. Otherwise, I will give you a call.  If you had a referral placed, they will call you to set up an appointment. Please give [place] a call if you don't hear back in the next 2 weeks.  If you need additional refills before your next appointment, please call your pharmacy first.

## 2022-04-21 LAB — NOVEL CORONAVIRUS, NAA: SARS-CoV-2, NAA: NOT DETECTED

## 2022-04-26 ENCOUNTER — Ambulatory Visit (HOSPITAL_COMMUNITY): Payer: Medicare Other

## 2022-05-01 ENCOUNTER — Ambulatory Visit (HOSPITAL_COMMUNITY): Admission: RE | Admit: 2022-05-01 | Payer: Medicare Other | Source: Ambulatory Visit

## 2022-05-02 ENCOUNTER — Encounter: Payer: Self-pay | Admitting: Pulmonary Disease

## 2022-05-02 ENCOUNTER — Ambulatory Visit: Payer: Medicare Other | Admitting: Pulmonary Disease

## 2022-05-02 VITALS — BP 128/84 | HR 70 | Temp 98.2°F | Ht 66.0 in | Wt 250.0 lb

## 2022-05-02 DIAGNOSIS — J40 Bronchitis, not specified as acute or chronic: Secondary | ICD-10-CM

## 2022-05-02 MED ORDER — BUDESONIDE-FORMOTEROL FUMARATE 160-4.5 MCG/ACT IN AERO: 2.0000 | INHALATION_SPRAY | Freq: Two times a day (BID) | RESPIRATORY_TRACT | 12 refills | Status: DC

## 2022-05-02 MED ORDER — PREDNISONE 10 MG PO TABS: ORAL_TABLET | ORAL | 0 refills | Status: AC

## 2022-05-02 MED ORDER — IPRATROPIUM BROMIDE 0.03 % NA SOLN: 2.0000 | Freq: Two times a day (BID) | NASAL | 12 refills | Status: DC

## 2022-05-02 NOTE — Patient Instructions (Addendum)
I am concerned you have bronchitis  We will start you on a prednisone taper to help with the inflammation of the airways  Start symbicort inhaler 2 puffs twice daily - rinse mouth out after each use  For nasal drainage start:  - Flonase nasal spray, 1 spray per nostril daily  - Start ipratroipum nasal spray, 2 sprays per nostril twice daily.  Follow up as scheduled with Dr. Lamonte Sakai

## 2022-05-02 NOTE — Progress Notes (Signed)
Synopsis: Referred in January 2024 for acute visit  Subjective:   PATIENT ID: [place]: female DOB: 1946-04-18, MRN: [id number]   HPI  Chief Complaint  Patient presents with   Acute Visit    Increased chest congestion and cough since Thanksgiving. Productive cough with yellow phlegm.    Rebecca Hicks is a 77 year old woman, former smoker with sarcoidosis and sleep apnea who returns to pulmonary clinic for acute visit.  She had increased cough and congestion over the past 2 months. Had covid and flu exposures but tested negative. She has not been on any steroids or antibiotics over the past couple of months. She is coughing up mucous, mostly at night which has been thick greenish tan. No sinus congestion or drainage. She has not been able to use her CPAP which makes cough worse. She has had some sneezing and wakes up with a sore throat. She is not using nasal sprays. She has been using albuterol which helps her clear up the wheezing. No issues with seasonal allergies.   Overall her symptoms have started to improve over the past couple of days.   Past Medical History:  Diagnosis Date   Abdominal pain 12/05/2011   Abdominal pain, left upper quadrant 12/03/2012   Acute on chronic renal failure ([place]) 12/25/2012   Allergy    SEASONAL   Arthritis    Arthropathy 11/24/2016   cervical spine   Ataxia 12/15/2015   Atypical chest pain 03/28/2016   Cataract    BILATERAL-REMOVED   CERUMEN IMPACTION, RIGHT 01/31/2010   Qualifier: Diagnosis of  By: Dianah Field MD, [place]     Chest tightness 02/23/2014   Chronic kidney disease (CKD), stage II (mild) 03/05/2013   Looks like baseline creat = 1.2 -1.3.  Watch for overdiuresis.    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   Hoarseness 10/31/2016   Hyperlipidemia    Hypertension    LOW BACK PAIN SYNDROME 05/28/2008   Qualifier: Diagnosis of  By: Andria Frames MD, William     Morbid obesity ([place])    Neck pain 11/24/2016   Neuromuscular disorder ([place])    Obesity hypoventilation syndrome ([place]) 11/10/2015   Obstructive sleep apnea 11/17/2015   Otalgia of right ear 11/24/2016   Polyneuropathy in diabetes(357.2)    Sarcoidosis    Sinusitis acute 10/03/2010   Sleep apnea    uses cpap   Sleep-related hypoventilation due to pulmonary parenchymal pathology 11/10/2015   Sleep study results  from 08/02/16 Trial of CPAP therapy on 10 cm H2O and 3 liters oxygen. - She was fitted with a Small size [employer] Full Face Mask Simplus mask and heated humidification.     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, POSITIVE 02/07/2007   Annotation: with clear CXR mid 73's Qualifier: History of  By: Wynetta Emery RN, Erika     TOBACCO USE, QUIT 02/07/2007   Qualifier: Diagnosis of  By: Andria Frames MD, William     Tremor of both hands 12/15/2015   U R I 03/15/2010   Qualifier: Diagnosis of  By: Andria Frames MD, [place] History  Problem Relation Age of Onset   Cervical cancer Mother    Stroke Father    Alcohol abuse Father     Heart disease Brother    Alcohol abuse Brother    Breast cancer Maternal Aunt    Pancreatic cancer Maternal Uncle    Stroke Paternal Grandmother    Hypertension Other        siblings   Diabetes Other        siblings   Colon cancer Neg Hx    Esophageal cancer Neg Hx    Rectal cancer Neg Hx    Stomach cancer Neg Hx    Colon polyps Neg Hx    Crohn's disease Neg Hx      Social History   Socioeconomic History   Marital status: Single    Spouse name: Not on file   Number of children: 0   Years of education: college   Highest education level: Not on file  Occupational History   Occupation: retired    Fish farm manager: [employer]  Tobacco Use   Smoking status: Former    Packs/day: 1.00    Years: 20.00    Total pack years: 20.00    Types: Cigarettes    Quit date: 04/24/1995    Years since quitting: 27.0    Passive exposure: Never   Smokeless tobacco: Never  Vaping Use   Vaping Use: Never used  Substance and Sexual Activity   Alcohol use: Yes    Alcohol/week: 0.0 standard drinks of alcohol    Comment: rarely   Drug use: No   Sexual activity: Not on file  Other Topics Concern   Not on file  Social History Narrative   Lives with 2 sisters and a nephew in a 2 story home but stays on the first floor.  Retired from the [employer].  Education: college.    Social Determinants of Health   Financial Resource Strain: Not on file  Food Insecurity: Not on file  Transportation Needs: Not on file  Physical Activity: Not on file  Stress: Not on file  Social Connections: Not on file  Intimate Partner Violence: Not on file     Allergies  Allergen Reactions   Penicillins Rash and Other (See Comments)    Has patient had a PCN reaction causing immediate rash, facial/tongue/throat swelling, SOB or lightheadedness with hypotension: Yes Has patient had a PCN reaction causing severe rash involving mucus membranes or skin necrosis: No Has patient had a PCN reaction that  required hospitalization: No Has patient had a PCN reaction occurring within the last 10 years: Yes If all of the above answers are "NO", then may proceed with Cephalosporin use.      Outpatient Medications Prior to Visit  Medication Sig Dispense Refill   acetaminophen (TYLENOL) 650 MG CR tablet Take 650 mg by  mouth every 8 (eight) hours as needed.      albuterol (PROAIR HFA) 108 (90 Base) MCG/ACT inhaler Inhale 2 puffs into the lungs every 6 (six) hours as needed for wheezing or shortness of breath. 1 each 2   amLODipine (NORVASC) 10 MG tablet TAKE 1 TABLET BY MOUTH  DAILY 90 tablet 3   aspirin 81 MG tablet Take 81 mg by mouth daily.     Bempedoic Acid-Ezetimibe (NEXLIZET) 180-10 MG TABS TAKE 1 TABLET BY MOUTH DAILY 90 tablet 2   Blood Glucose Monitoring Suppl (ONE TOUCH ULTRA 2) w/Device KIT Test blood sugar three times per day. 1 each 0   Blood Pressure Monitoring (BLOOD PRESSURE KIT) DEVI 1 Act by Does not apply route daily. 1 Device 0   Cholecalciferol (VITAMIN D) 125 MCG (5000 UT) CAPS Take 5,000 Int'l Units/day by mouth daily.     Continuous Blood Gluc Receiver (DEXCOM G6 RECEIVER) DEVI 1 Device by Does not apply route as directed. Use with Dexcom G6 sensor and transmitter 1 each 1   Continuous Blood Gluc Sensor (DEXCOM G6 SENSOR) MISC Inject 1 applicator into the skin as directed. Change sensor every 10 days. 3 each 11   Continuous Blood Gluc Transmit (DEXCOM G6 TRANSMITTER) MISC Inject 1 Device into the skin as directed. Reuse 8 times with sensor changes. 1 each 3   doxazosin (CARDURA) 4 MG tablet Take 1 tablet (4 mg total) by mouth daily. 90 tablet 3   DULoxetine (CYMBALTA) 60 MG capsule Take 1 capsule (60 mg total) by mouth daily. 90 capsule 3   empagliflozin (JARDIANCE) 25 MG TABS tablet Take 1 tablet (25 mg total) by mouth daily. 90 tablet 3   furosemide (LASIX) 20 MG tablet Take 1 tablet (20 mg total) by mouth as needed. 30 tablet 3   hydrochlorothiazide (HYDRODIURIL) 12.5 MG  tablet TAKE 1 TABLET BY MOUTH  DAILY 90 tablet 3   insulin degludec (TRESIBA FLEXTOUCH) 100 UNIT/ML FlexTouch Pen Inject 25 Units into the skin daily. (Patient taking differently: Inject 24 Units into the skin daily.)     Insulin Lispro w/ Trans Port 100 UNIT/ML SOPN Inject 10-18 Units into the skin 2 (two) times daily. Before meals     Insulin Pen Needle (B-D ULTRAFINE III SHORT PEN) 31G X 8 MM MISC USE TO INJECT INSULIN 4 times daily. 360 each 3   losartan (COZAAR) 100 MG tablet TAKE 1 TABLET BY MOUTH  DAILY 90 tablet 3   metFORMIN (GLUCOPHAGE-XR) 500 MG 24 hr tablet TAKE 1 TABLET BY MOUTH 3  TIMES DAILY BEFORE MEALS 270 tablet 3   metoprolol succinate (TOPROL-XL) 50 MG 24 hr tablet TAKE 1 TABLET BY MOUTH IN THE  EVENING WITH OR IMMEDIATELY  FOLLOWING A MEAL 90 tablet 3   nitroGLYCERIN (NITROSTAT) 0.4 MG SL tablet Place 1 tablet (0.4 mg total) under the tongue every 5 (five) minutes as needed for chest pain. 25 tablet 3   omeprazole (PRILOSEC) 40 MG capsule TAKE 1 CAPSULE(40 MG) BY MOUTH DAILY 90 capsule 3   ONETOUCH ULTRA test strip CHECK BLOOD SUGAR 3 TIMES  DAILY 300 strip 3   rosuvastatin (CRESTOR) 20 MG tablet TAKE 1 TABLET BY MOUTH DAILY 90 tablet 3   Semaglutide, 2 MG/DOSE, (OZEMPIC, 2 MG/DOSE,) 8 MG/3ML SOPN Inject 2 mg into the skin once a week.     spironolactone (ALDACTONE) 25 MG tablet TAKE 1 TABLET BY MOUTH  DAILY 90 tablet 3   timolol (TIMOPTIC) 0.5 % ophthalmic solution  Place 1 drop into both eyes 2 times daily.     No facility-administered medications prior to visit.    Review of Systems  Constitutional:  Negative for chills, fever, malaise/fatigue and weight loss.  HENT:  Positive for congestion. Negative for sinus pain and sore throat.   Eyes: Negative.   Respiratory:  Positive for cough, sputum production, shortness of breath and wheezing. Negative for hemoptysis.   Cardiovascular:  Negative for chest pain, palpitations, orthopnea, claudication and leg swelling.   Gastrointestinal:  Negative for abdominal pain, heartburn, nausea and vomiting.  Genitourinary: Negative.   Musculoskeletal:  Negative for joint pain and myalgias.  Skin:  Negative for rash.  Neurological:  Negative for weakness.  Endo/Heme/Allergies:  Positive for environmental allergies.  Psychiatric/Behavioral: Negative.     Objective:   Vitals:   05/02/22 1137  BP: 128/84  Pulse: 70  Temp: 98.2 F (36.8 C)  TempSrc: Oral  SpO2: 100%  Weight: 250 lb (113.4 kg)  Height: '5\' 6"'$  (1.676 m)   Physical Exam Constitutional:      General: She is not in acute distress.    Appearance: She is not ill-appearing.  HENT:     Head: Normocephalic and atraumatic.  Eyes:     General: No scleral icterus.    Conjunctiva/sclera: Conjunctivae normal.     Pupils: Pupils are equal, round, and reactive to light.  Cardiovascular:     Rate and Rhythm: Normal rate and regular rhythm.     Pulses: Normal pulses.     Heart sounds: Normal heart sounds. No murmur heard. Pulmonary:     Effort: Pulmonary effort is normal.     Breath sounds: Normal breath sounds. No wheezing, rhonchi or rales.  Abdominal:     General: Bowel sounds are normal.     Palpations: Abdomen is soft.  Musculoskeletal:     Right lower leg: No edema.     Left lower leg: No edema.  Lymphadenopathy:     Cervical: No cervical adenopathy.  Skin:    General: Skin is warm and dry.  Neurological:     General: No focal deficit present.     Mental Status: She is alert.  Psychiatric:        Mood and Affect: Mood normal.        Behavior: Behavior normal.        Thought Content: Thought content normal.        Judgment: Judgment normal.    CBC    Component Value Date/Time   WBC 5.8 11/27/2021 1434   WBC 6.7 09/23/2017 1622   RBC 4.69 11/27/2021 1434   RBC 4.86 09/23/2017 1622   HGB 13.4 11/27/2021 1434   HCT 41.3 11/27/2021 1434   PLT 193 11/27/2021 1434   MCV 88 11/27/2021 1434   MCH 28.6 11/27/2021 1434   MCH 27.4  09/23/2017 1622   MCHC 32.4 11/27/2021 1434   MCHC 30.6 09/23/2017 1622   RDW 15.4 11/27/2021 1434   LYMPHSABS 1.6 09/23/2017 1622   MONOABS 0.7 09/23/2017 1622   EOSABS 0.2 09/23/2017 1622   BASOSABS 0.1 09/23/2017 1622      Latest Ref Rng & Units 12/12/2021   12:22 PM 11/27/2021    2:34 PM 03/23/2021    9:31 AM  BMP  Glucose 70 - 99 mg/dL 154  131  174   BUN 8 - 27 mg/dL '25  30  29   '$ Creatinine 0.57 - 1.00 mg/dL 1.48  1.50  1.55   BUN/Creat  Ratio 12 - '28 17  20  19   '$ Sodium 134 - 144 mmol/L 140  137  141   Potassium 3.5 - 5.2 mmol/L 5.1  4.7  4.7   Chloride 96 - 106 mmol/L 104  96  103   CO2 20 - 29 mmol/L '19  27  24   '$ Calcium 8.7 - 10.3 mg/dL 9.8  9.5  9.6    Chest imaging: CXR 10/2021 1. No active cardiopulmonary disease. 2.  Aortic Atherosclerosis PFT:    Latest Ref Rng & Units 12/30/2019   11:52 AM  PFT Results  FVC-Pre L 3.07   FVC-Predicted Pre % 128   FVC-Post L 3.12   FVC-Predicted Post % 130   Pre FEV1/FVC % % 86   Post FEV1/FCV % % 88   FEV1-Pre L 2.65   FEV1-Predicted Pre % 143   FEV1-Post L 2.73   DLCO uncorrected ml/min/mmHg 18.31   DLCO UNC% % 91   DLCO corrected ml/min/mmHg 18.31   DLCO COR %Predicted % 91   DLVA Predicted % 91   TLC L 6.19   TLC % Predicted % 118   RV % Predicted % 109     Labs:  Path:  Echo:  Heart Catheterization:  Assessment & Plan:   Bronchitis - Plan: budesonide-formoterol (SYMBICORT) 160-4.5 MCG/ACT inhaler, ipratropium (ATROVENT) 0.03 % nasal spray, predniSONE (DELTASONE) 10 MG tablet  Discussion: Rebecca Hicks is a 77 year old woman, former smoker with sarcoidosis and sleep apnea who returns to pulmonary clinic for acute visit.  She appears to have bronchitis from likely viral etiology with her significant exposures over recent weeks.   She is to start symbicort inhaler 2 puffs twice daily along with flonase nasal spray and ipratropium nasal spray.   We will start her on steroid taper.  Follow up with Dr.  Lamonte Sakai as scheduled.  Freda Jackson, MD [facility] Office: [phone]   Current Outpatient Medications:    acetaminophen (TYLENOL) 650 MG CR tablet, Take 650 mg by mouth every 8 (eight) hours as needed. , Disp: , Rfl:    albuterol (PROAIR HFA) 108 (90 Base) MCG/ACT inhaler, Inhale 2 puffs into the lungs every 6 (six) hours as needed for wheezing or shortness of breath., Disp: 1 each, Rfl: 2   amLODipine (NORVASC) 10 MG tablet, TAKE 1 TABLET BY MOUTH  DAILY, Disp: 90 tablet, Rfl: 3   aspirin 81 MG tablet, Take 81 mg by mouth daily., Disp: , Rfl:    Bempedoic Acid-Ezetimibe (NEXLIZET) 180-10 MG TABS, TAKE 1 TABLET BY MOUTH DAILY, Disp: 90 tablet, Rfl: 2   Blood Glucose Monitoring Suppl (ONE TOUCH ULTRA 2) w/Device KIT, Test blood sugar three times per day., Disp: 1 each, Rfl: 0   Blood Pressure Monitoring (BLOOD PRESSURE KIT) DEVI, 1 Act by Does not apply route daily., Disp: 1 Device, Rfl: 0   budesonide-formoterol (SYMBICORT) 160-4.5 MCG/ACT inhaler, Inhale 2 puffs into the lungs 2 (two) times daily., Disp: 1 each, Rfl: 12   Cholecalciferol (VITAMIN D) 125 MCG (5000 UT) CAPS, Take 5,000 Int'l Units/day by mouth daily., Disp: , Rfl:    Continuous Blood Gluc Receiver ([place]) DEVI, 1 Device by Does not apply route as directed. Use with Dexcom G6 sensor and transmitter, Disp: 1 each, Rfl: 1   Continuous Blood Gluc Sensor (DEXCOM G6 SENSOR) MISC, Inject 1 applicator into the skin as directed. Change sensor every 10 days., Disp: 3 each, Rfl: 11   Continuous  Blood Gluc Transmit (DEXCOM G6 TRANSMITTER) MISC, Inject 1 Device into the skin as directed. Reuse 8 times with sensor changes., Disp: 1 each, Rfl: 3   doxazosin (CARDURA) 4 MG tablet, Take 1 tablet (4 mg total) by mouth daily., Disp: 90 tablet, Rfl: 3   DULoxetine (CYMBALTA) 60 MG capsule, Take 1 capsule (60 mg total) by mouth daily., Disp: 90 capsule, Rfl: 3   empagliflozin (JARDIANCE) 25 MG TABS tablet,  Take 1 tablet (25 mg total) by mouth daily., Disp: 90 tablet, Rfl: 3   furosemide (LASIX) 20 MG tablet, Take 1 tablet (20 mg total) by mouth as needed., Disp: 30 tablet, Rfl: 3   hydrochlorothiazide (HYDRODIURIL) 12.5 MG tablet, TAKE 1 TABLET BY MOUTH  DAILY, Disp: 90 tablet, Rfl: 3   insulin degludec (TRESIBA FLEXTOUCH) 100 UNIT/ML FlexTouch Pen, Inject 25 Units into the skin daily. (Patient taking differently: Inject 24 Units into the skin daily.), Disp: , Rfl:    Insulin Lispro w/ Trans Port 100 UNIT/ML SOPN, Inject 10-18 Units into the skin 2 (two) times daily. Before meals, Disp: , Rfl:    Insulin Pen Needle (B-D ULTRAFINE III SHORT PEN) 31G X 8 MM MISC, USE TO INJECT INSULIN 4 times daily., Disp: 360 each, Rfl: 3   ipratropium (ATROVENT) 0.03 % nasal spray, Place 2 sprays into both nostrils every 12 (twelve) hours., Disp: 30 mL, Rfl: 12   losartan (COZAAR) 100 MG tablet, TAKE 1 TABLET BY MOUTH  DAILY, Disp: 90 tablet, Rfl: 3   metFORMIN (GLUCOPHAGE-XR) 500 MG 24 hr tablet, TAKE 1 TABLET BY MOUTH 3  TIMES DAILY BEFORE MEALS, Disp: 270 tablet, Rfl: 3   metoprolol succinate (TOPROL-XL) 50 MG 24 hr tablet, TAKE 1 TABLET BY MOUTH IN THE  EVENING WITH OR IMMEDIATELY  FOLLOWING A MEAL, Disp: 90 tablet, Rfl: 3   nitroGLYCERIN (NITROSTAT) 0.4 MG SL tablet, Place 1 tablet (0.4 mg total) under the tongue every 5 (five) minutes as needed for chest pain., Disp: 25 tablet, Rfl: 3   omeprazole (PRILOSEC) 40 MG capsule, TAKE 1 CAPSULE(40 MG) BY MOUTH DAILY, Disp: 90 capsule, Rfl: 3   ONETOUCH ULTRA test strip, CHECK BLOOD SUGAR 3 TIMES  DAILY, Disp: 300 strip, Rfl: 3   predniSONE (DELTASONE) 10 MG tablet, Take 4 tablets (40 mg total) by mouth daily with breakfast for 3 days, THEN 3 tablets (30 mg total) daily with breakfast for 3 days, THEN 2 tablets (20 mg total) daily with breakfast for 3 days, THEN 1 tablet (10 mg total) daily with breakfast for 3 days., Disp: 30 tablet, Rfl: 0   rosuvastatin (CRESTOR) 20 MG  tablet, TAKE 1 TABLET BY MOUTH DAILY, Disp: 90 tablet, Rfl: 3   Semaglutide, 2 MG/DOSE, (OZEMPIC, 2 MG/DOSE,) 8 MG/3ML SOPN, Inject 2 mg into the skin once a week., Disp: , Rfl:    spironolactone (ALDACTONE) 25 MG tablet, TAKE 1 TABLET BY MOUTH  DAILY, Disp: 90 tablet, Rfl: 3   timolol (TIMOPTIC) 0.5 % ophthalmic solution, Place 1 drop into both eyes 2 times daily., Disp: , Rfl:

## 2022-05-04 ENCOUNTER — Telehealth: Payer: Self-pay | Admitting: Emergency Medicine

## 2022-05-04 NOTE — Telephone Encounter (Signed)
Pt saw Dr. Erin Fulling for an appt 1/10 and was placed on prednisone. Pt will need to have CT scan rescheduled and will need to be closer to pt's follow up appt with Dr. Lamonte Sakai.  Pt is also wanting to change the location of the CT to [place].  Routing this to PCCs so they can call pt to get this taken care of.

## 2022-05-05 ENCOUNTER — Encounter: Payer: Self-pay | Admitting: Pulmonary Disease

## 2022-05-10 ENCOUNTER — Ambulatory Visit (HOSPITAL_COMMUNITY): Payer: Medicare Other

## 2022-05-10 ENCOUNTER — Encounter (HOSPITAL_COMMUNITY): Payer: Self-pay

## 2022-05-11 ENCOUNTER — Telehealth: Payer: Self-pay

## 2022-05-11 NOTE — Telephone Encounter (Signed)
Pt called to follow up on tresiba shipment.  Order is still processing. Shipping time are 4-6 weeks out at the moment.   Pt wanted to know if we had samples in stock?

## 2022-05-14 NOTE — Telephone Encounter (Signed)
Noted and agree. 

## 2022-05-14 NOTE — Telephone Encounter (Signed)
Contacted patient to discuss short-term Tresiba (insulin degludec) supply as sample to cover delay in the Med [employer] shipment delivery (anticipated to be ~ 4-6 weeks).   Medication Samples have been provided for the patient to pick-up later today.  Drug name: Tyler Aas (insulin degludec)       Strength: 100units/ml        Qty: 3 pens  LOT: [id number]  Exp.Date: 04/22/2024  Dosing instructions: 24 units daily.   The patient has been instructed regarding the correct time, dose, and frequency of taking this medication, including desired effects and most common side effects.   Janeann Forehand 11:09 AM 05/14/2022  Patient thanked me for the support and timely response.

## 2022-05-17 ENCOUNTER — Telehealth: Payer: Self-pay

## 2022-05-17 NOTE — Telephone Encounter (Signed)
Informed patient her novo nordisk shipment of tresiba is ready for pickup.  2 boxes are labeled and ready in med room fridge

## 2022-05-21 NOTE — Telephone Encounter (Signed)
Medication given to patients sister.

## 2022-05-29 ENCOUNTER — Encounter: Payer: Self-pay | Admitting: Family Medicine

## 2022-05-29 ENCOUNTER — Telehealth: Payer: Self-pay | Admitting: Emergency Medicine

## 2022-05-31 ENCOUNTER — Encounter: Payer: Self-pay | Admitting: Family Medicine

## 2022-05-31 ENCOUNTER — Ambulatory Visit: Payer: Medicare Other | Admitting: Family Medicine

## 2022-05-31 VITALS — BP 160/82 | HR 74 | Ht 66.0 in | Wt 254.6 lb

## 2022-05-31 DIAGNOSIS — E11649 Type 2 diabetes mellitus with hypoglycemia without coma: Secondary | ICD-10-CM | POA: Diagnosis not present

## 2022-05-31 DIAGNOSIS — N1832 Chronic kidney disease, stage 3b: Secondary | ICD-10-CM

## 2022-05-31 DIAGNOSIS — Z794 Long term (current) use of insulin: Secondary | ICD-10-CM

## 2022-05-31 DIAGNOSIS — E1140 Type 2 diabetes mellitus with diabetic neuropathy, unspecified: Secondary | ICD-10-CM | POA: Diagnosis not present

## 2022-05-31 DIAGNOSIS — R059 Cough, unspecified: Secondary | ICD-10-CM

## 2022-05-31 DIAGNOSIS — I10 Essential (primary) hypertension: Secondary | ICD-10-CM | POA: Diagnosis not present

## 2022-05-31 DIAGNOSIS — E1122 Type 2 diabetes mellitus with diabetic chronic kidney disease: Secondary | ICD-10-CM

## 2022-05-31 MED ORDER — EMPAGLIFLOZIN 25 MG PO TABS: 25.0000 mg | ORAL_TABLET | Freq: Every day | ORAL | 3 refills | Status: DC

## 2022-05-31 MED ORDER — HYDROCHLOROTHIAZIDE 12.5 MG PO TABS: 12.5000 mg | ORAL_TABLET | Freq: Every day | ORAL | 3 refills | Status: DC

## 2022-05-31 NOTE — Patient Instructions (Signed)
I will call with test results. Please double down on salt and diet. Check your blood pressure Keep those cows in the barn. I will miss you.

## 2022-06-01 ENCOUNTER — Encounter: Payer: Self-pay | Admitting: Family Medicine

## 2022-06-01 NOTE — Progress Notes (Signed)
    SUBJECTIVE:   CHIEF COMPLAINT / HPI:   FU HBP, BP up today on initial check and with manual repeat.  Patient states she has some recent dietary indiscretion with both caloric intake and with Na in particular.  Denies CP and SOB DM, early for an A1C.  Dietary indiscretion as above.  Also note that weight is generally down trending but up since last visit. CKD 3b.  Due for creat and UAC  Already on both SGLT2 and ARB FU broncitis and sarcoidosis.  Has a CT scan and PFTs already ordered for next week by her pulmonologist.  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  She has clinically improvied.   OBJECTIVE:   BP (!) 160/82 (BP Location: Right Arm, Cuff Size: Large)   Pulse 74   Ht '5\' 6"'$  (1.676 m)   Wt 254 lb 9.6 oz (115.5 kg)   SpO2 94%   BMI 41.09 kg/m   Lungs clear Cardiac RRR without m or g   ASSESSMENT/PLAN:   CKD (chronic kidney disease) stage 3, GFR 30-59 ml/min (HCC) BMP and microalbumin.  BMP comes back with mild bump in creat.  Called.  She will increase hydration and recheck in on month.  Future order entered.    Diabetic neuropathy, type II diabetes mellitus Get back on healthy diet which was yielding good results.  Essential hypertension Poor control.  Patient is reluctant to add another BP med and wants to focus on diet.  She will check home BPs and FU in 2 weeks if elevated.    Cough FU per pulm.  Symptoms are nicely resolving     Zenia Resides, MD [place]

## 2022-06-01 NOTE — Assessment & Plan Note (Addendum)
Poor control.  Patient is reluctant to add another BP med and wants to focus on diet.  She will check home BPs and FU in 2 weeks if elevated.

## 2022-06-01 NOTE — Assessment & Plan Note (Signed)
Get back on healthy diet which was yielding good results.

## 2022-06-01 NOTE — Assessment & Plan Note (Signed)
BMP and microalbumin.  BMP comes back with mild bump in creat.  Called.  She will increase hydration and recheck in on month.  Future order entered.

## 2022-06-01 NOTE — Assessment & Plan Note (Signed)
FU per pulm.  Symptoms are nicely resolving

## 2022-06-02 LAB — MICROALBUMIN / CREATININE URINE RATIO
Creatinine, Urine: 67.8 mg/dL
Microalb/Creat Ratio: 351 mg/g creat — ABNORMAL HIGH (ref 0–29)
Microalbumin, Urine: 237.7 ug/mL

## 2022-06-02 LAB — BASIC METABOLIC PANEL
BUN/Creatinine Ratio: 16 (ref 12–28)
BUN: 29 mg/dL — ABNORMAL HIGH (ref 8–27)
CO2: 23 mmol/L (ref 20–29)
Calcium: 9.5 mg/dL (ref 8.7–10.3)
Chloride: 99 mmol/L (ref 96–106)
Creatinine, Ser: 1.86 mg/dL — ABNORMAL HIGH (ref 0.57–1.00)
Glucose: 156 mg/dL — ABNORMAL HIGH (ref 70–99)
Potassium: 4.7 mmol/L (ref 3.5–5.2)
Sodium: 138 mmol/L (ref 134–144)
eGFR: 28 mL/min/{1.73_m2} — ABNORMAL LOW (ref >59)

## 2022-06-04 ENCOUNTER — Ambulatory Visit (HOSPITAL_COMMUNITY): Payer: Medicare Other

## 2022-06-05 ENCOUNTER — Ambulatory Visit (HOSPITAL_COMMUNITY): Payer: Medicare Other

## 2022-06-07 ENCOUNTER — Ambulatory Visit: Payer: Medicare Other | Admitting: Emergency Medicine

## 2022-06-08 ENCOUNTER — Encounter (HOSPITAL_BASED_OUTPATIENT_CLINIC_OR_DEPARTMENT_OTHER): Payer: Medicare Other

## 2022-06-16 ENCOUNTER — Ambulatory Visit (HOSPITAL_BASED_OUTPATIENT_CLINIC_OR_DEPARTMENT_OTHER): Payer: Medicare Other

## 2022-06-23 ENCOUNTER — Ambulatory Visit (HOSPITAL_BASED_OUTPATIENT_CLINIC_OR_DEPARTMENT_OTHER)
Admission: RE | Admit: 2022-06-23 | Discharge: 2022-06-23 | Disposition: A | Discharge status: Home / Self Care | Payer: Medicare Other | Source: Ambulatory Visit | Attending: Emergency Medicine | Admitting: Emergency Medicine

## 2022-06-23 DIAGNOSIS — D869 Sarcoidosis, unspecified: Secondary | ICD-10-CM | POA: Insufficient documentation

## 2022-06-25 ENCOUNTER — Ambulatory Visit (INDEPENDENT_AMBULATORY_CARE_PROVIDER_SITE_OTHER): Payer: Medicare Other | Admitting: Emergency Medicine

## 2022-06-25 DIAGNOSIS — D869 Sarcoidosis, unspecified: Secondary | ICD-10-CM | POA: Diagnosis not present

## 2022-06-25 LAB — PULMONARY FUNCTION TEST
DL/VA % pred: 99 %
DL/VA: 4.07 ml/min/mmHg/L
DLCO cor % pred: 87 %
DLCO cor: 17.03 ml/min/mmHg
DLCO unc % pred: 87 %
DLCO unc: 17.03 ml/min/mmHg
FEF 25-75 Post: 3.22 L/sec
FEF 25-75 Pre: 2.68 L/sec
FEF2575-%Change-Post: 19 %
FEF2575-%Pred-Post: 190 %
FEF2575-%Pred-Pre: 159 %
FEV1-%Change-Post: 6 %
FEV1-%Pred-Post: 110 %
FEV1-%Pred-Pre: 103 %
FEV1-Post: 2.38 L
FEV1-Pre: 2.24 L
FEV1FVC-%Change-Post: 2 %
FEV1FVC-%Pred-Pre: 113 %
FEV6-%Change-Post: 4 %
FEV6-%Pred-Post: 99 %
FEV6-%Pred-Pre: 95 %
FEV6-Post: 2.72 L
FEV6-Pre: 2.62 L
FEV6FVC-%Pred-Post: 105 %
FEV6FVC-%Pred-Pre: 105 %
FVC-%Change-Post: 3 %
FVC-%Pred-Post: 94 %
FVC-%Pred-Pre: 91 %
FVC-Post: 2.72 L
FVC-Pre: 2.62 L
Post FEV1/FVC ratio: 87 %
Post FEV6/FVC ratio: 100 %
Pre FEV1/FVC ratio: 85 %
Pre FEV6/FVC Ratio: 100 %
RV % pred: 83 %
RV: 1.92 L
TLC % pred: 101 %
TLC: 5.19 L

## 2022-06-25 NOTE — Patient Instructions (Signed)
Full PFT Performed Today. 

## 2022-06-25 NOTE — Progress Notes (Signed)
Full PFT Performed Today. 

## 2022-06-27 NOTE — Telephone Encounter (Signed)
Patient was able to have her CT scan at [facility]. Will close encounter.

## 2022-07-17 ENCOUNTER — Ambulatory Visit: Payer: Medicare Other | Admitting: Emergency Medicine

## 2022-07-17 ENCOUNTER — Encounter: Payer: Self-pay | Admitting: Emergency Medicine

## 2022-07-17 VITALS — BP 134/72 | HR 76 | Temp 97.5°F | Ht 65.5 in | Wt 255.4 lb

## 2022-07-17 DIAGNOSIS — J310 Chronic rhinitis: Secondary | ICD-10-CM | POA: Insufficient documentation

## 2022-07-17 DIAGNOSIS — D869 Sarcoidosis, unspecified: Secondary | ICD-10-CM

## 2022-07-17 DIAGNOSIS — R059 Cough, unspecified: Secondary | ICD-10-CM

## 2022-07-17 DIAGNOSIS — K219 Gastro-esophageal reflux disease without esophagitis: Secondary | ICD-10-CM

## 2022-07-17 NOTE — Progress Notes (Signed)
HPI:   ROV 07/17/2022 --follow-up visit for history of sarcoidosis with associated obstructive lung disease.  She also has obstructive sleep apnea, chronic cough in the setting of GERD and chronic rhinitis.  She is on CPAP.  She was treated for an acute bronchitis in January following a viral infection, possibly the flu.  We had tried stopping her Symbicort. Was treated w prednisone, restarted symbicort temporarily, nasal sprays. She felt better, less congestion but cough has never completely gone away.  Today she reports some increase in her cough, especially bothersome at night, producing white mucous, making it tough to wear her CPAP.  Currently on omeprazole 40 mg daily, albuterol about once a day.   CT chest 06/23/2022 reviewed by me, shows no hilar or mediastinal adenopathy, no infiltrates, no scarring that would be consistent with sarcoidosis.  Pulmonary function testing 06/25/2022 reviewed by me shows normal airflows with out of bronchodilator response, normal lung volumes, normal diffusion capacity.  Her FEV1 on is 2.24 L (103% predicted), down slightly from 2.65 L 2 years ago   Exam:  Vitals:   07/17/22 1115  BP: 134/72  Pulse: 76  Temp: (!) 97.5 F (36.4 C)  TempSrc: Oral  SpO2: 96%  Weight: 255 lb 6.4 oz (115.8 kg)  Height: 5' 5.5" (1.664 m)     Gen: Pleasant, obese, in no distress,  normal affect, obese   ENT: No lesions,  mouth clear,  oropharynx clear, no postnasal drip  Neck: No JVD, no Stridor  Lungs: No use of accessory muscles, somewhat distant, no crackles or wheezing   Cardiovascular: RRR, heart sounds normal, no murmur or gallops, no peripheral edema  Musculoskeletal: No deformities, no cyanosis or clubbing  Neuro: alert, non focal  Skin: Some old hyperpigmentation but no evidence of active sarcoid rash    SARCOIDOSIS, PULMONARY We reviewed your CT scan of the chest and your pulmonary function testing today.  Both look good without any evidence of active  or evolving sarcoidosis. We will hold off on restarting her Symbicort for now Please keep your albuterol available to use 2 puffs when needed for shortness of breath, chest tightness, wheezing.   Cough Will work to control GERD and rhinitis as well is possible to help with her chronic cough, has been more active lately  GERD (gastroesophageal reflux disease) Continue omeprazole 40 mg once daily.  Depending on how your cough does we may consider increasing this in the future  Chronic rhinitis Restart your fluticasone (Flonase) nasal spray, 2 sprays each nostril once daily You can use your ipratropium nasal spray, 2 sprays each nostril up to 3 times daily if you need it for nasal congestion and drainage.  You would probably benefit from using this each night before going to bed. Restart your Zyrtec once daily     Baltazar Apo, MD, PhD 07/17/2022, 5:00 PM [facility] [phone] or if no answer [phone]

## 2022-07-17 NOTE — Assessment & Plan Note (Signed)
Restart your fluticasone (Flonase) nasal spray, 2 sprays each nostril once daily You can use your ipratropium nasal spray, 2 sprays each nostril up to 3 times daily if you need it for nasal congestion and drainage.  You would probably benefit from using this each night before going to bed. Restart your Zyrtec once daily

## 2022-07-17 NOTE — Assessment & Plan Note (Signed)
Continue omeprazole 40 mg once daily.  Depending on how your cough does we may consider increasing this in the future

## 2022-07-17 NOTE — Assessment & Plan Note (Signed)
We reviewed your CT scan of the chest and your pulmonary function testing today.  Both look good without any evidence of active or evolving sarcoidosis. We will hold off on restarting her Symbicort for now Please keep your albuterol available to use 2 puffs when needed for shortness of breath, chest tightness, wheezing.

## 2022-07-17 NOTE — Assessment & Plan Note (Signed)
Will work to control GERD and rhinitis as well is possible to help with her chronic cough, has been more active lately

## 2022-07-17 NOTE — Patient Instructions (Signed)
We reviewed your CT scan of the chest and your pulmonary function testing today.  Both look good without any evidence of active or evolving sarcoidosis. We will hold off on restarting her Symbicort for now Please keep your albuterol available to use 2 puffs when needed for shortness of breath, chest tightness, wheezing. Continue omeprazole 40 mg once daily.  Depending on how your cough does we may consider increasing this in the future Restart your fluticasone (Flonase) nasal spray, 2 sprays each nostril once daily You can use your ipratropium nasal spray, 2 sprays each nostril up to 3 times daily if you need it for nasal congestion and drainage.  You would probably benefit from using this each night before going to bed. Restart your Zyrtec once daily Try to get back on your CPAP every night if possible Follow Dr. Lamonte Sakai in about 3 months, sooner if you have problems.

## 2022-08-21 ENCOUNTER — Telehealth: Payer: Self-pay

## 2022-08-21 NOTE — Telephone Encounter (Signed)
Informed patient her novo nordisk shipment is ready for pickup.  2 boxes of Tresiba pens and 4 boxes of Ozempic 1mg  dose pens, are labeled and ready for pickup in med room fridge.

## 2022-08-29 ENCOUNTER — Telehealth: Payer: Self-pay | Admitting: Family Medicine

## 2022-08-29 NOTE — Telephone Encounter (Signed)
Called patient to schedule Medicare Annual Wellness Visit (AWV). Left message for patient to call back and schedule Medicare Annual Wellness Visit (AWV).  Last date of AWV: AWVI eligible as of 06/22/2018  Please schedule an AWVI appointment at any time with [facility] VISIT.  If any questions, please contact me at [phone].   Thank you,  [facility] Support [facility] Direct dial  [phone]

## 2022-08-30 NOTE — Telephone Encounter (Signed)
Medication given to patient

## 2022-09-04 ENCOUNTER — Telehealth: Payer: Self-pay

## 2022-09-04 NOTE — Telephone Encounter (Signed)
Faxed ozempic 2mg  dose increase to novo nordisk (3rd fax).

## 2022-09-19 ENCOUNTER — Encounter: Payer: Self-pay | Admitting: Pharmacist

## 2022-09-19 ENCOUNTER — Ambulatory Visit: Payer: Medicare Other | Admitting: Pharmacist

## 2022-09-19 ENCOUNTER — Encounter: Payer: Self-pay | Admitting: Family Medicine

## 2022-09-19 ENCOUNTER — Ambulatory Visit: Payer: Medicare Other | Admitting: Family Medicine

## 2022-09-19 VITALS — BP 135/83 | HR 69 | Ht 64.76 in | Wt 253.2 lb

## 2022-09-19 VITALS — BP 136/78 | HR 67 | Ht 64.5 in | Wt 253.0 lb

## 2022-09-19 DIAGNOSIS — N1832 Chronic kidney disease, stage 3b: Secondary | ICD-10-CM | POA: Diagnosis not present

## 2022-09-19 DIAGNOSIS — I10 Essential (primary) hypertension: Secondary | ICD-10-CM

## 2022-09-19 DIAGNOSIS — N183 Chronic kidney disease, stage 3 unspecified: Secondary | ICD-10-CM

## 2022-09-19 DIAGNOSIS — G5601 Carpal tunnel syndrome, right upper limb: Secondary | ICD-10-CM

## 2022-09-19 DIAGNOSIS — Z9289 Personal history of other medical treatment: Secondary | ICD-10-CM | POA: Diagnosis not present

## 2022-09-19 DIAGNOSIS — Z23 Encounter for immunization: Secondary | ICD-10-CM

## 2022-09-19 DIAGNOSIS — E1122 Type 2 diabetes mellitus with diabetic chronic kidney disease: Secondary | ICD-10-CM

## 2022-09-19 DIAGNOSIS — Z0000 Encounter for general adult medical examination without abnormal findings: Secondary | ICD-10-CM

## 2022-09-19 LAB — POCT GLYCOSYLATED HEMOGLOBIN (HGB A1C): HbA1c, POC (controlled diabetic range): 8 % — AB (ref 0.0–7.0)

## 2022-09-19 MED ORDER — SPIRONOLACTONE 25 MG PO TABS: 25.0000 mg | ORAL_TABLET | Freq: Every day | ORAL | 3 refills | Status: DC

## 2022-09-19 MED ORDER — INSULIN LISPRO W/ TRANS PORT 100 UNIT/ML ~~LOC~~ SOPN: 10.0000 [IU] | PEN_INJECTOR | Freq: Two times a day (BID) | SUBCUTANEOUS | Status: DC

## 2022-09-19 MED ORDER — AMLODIPINE BESYLATE 5 MG PO TABS: 5.0000 mg | ORAL_TABLET | Freq: Every day | ORAL | 3 refills | Status: DC

## 2022-09-19 MED ORDER — DOXAZOSIN MESYLATE 2 MG PO TABS: 2.0000 mg | ORAL_TABLET | Freq: Every day | ORAL | 3 refills | Status: DC

## 2022-09-19 MED ORDER — METOPROLOL SUCCINATE ER 50 MG PO TB24: 50.0000 mg | ORAL_TABLET | Freq: Every day | ORAL | 3 refills | Status: DC

## 2022-09-19 MED ORDER — LOSARTAN POTASSIUM-HCTZ 100-12.5 MG PO TABS: 1.0000 | ORAL_TABLET | Freq: Every day | ORAL | 3 refills | Status: DC

## 2022-09-19 NOTE — Assessment & Plan Note (Signed)
Diabetes longstanding currently under fair control. Patient is able to verbalize appropriate hypoglycemia management plan. Medication adherence appears variable with patient choosing to not take HTN and T2DM medications based on current BP and glucose, respectively. Control is suboptimal due to above goal blood glucose following lunch time meals. -Increased lunch time insulin (Humulin) by 4 units on top of sliding scale. Current sliding scale: 120-150 take 8-10 units, 151-200 take 10-15 units, 201-250 take 15-18 units. -Continued Tresiba (insulin degludec) at 24 units once daily, semaglutide (Ozempic) 1.5 mg weekly, empagliflozin (JARDIANCE) 25 MG daily, and metformin 500 mg TID (takes BID instead). -Patient instructed to download Dexcom G7 app on her mobile phone prior to next visit.  -Plan to initiate Dexcom G7 at next visit.  -Extensively discussed pathophysiology of diabetes, recommended lifestyle interventions, dietary effects on blood sugar control.  -Counseled on s/sx of and management of hypoglycemia.  -Next A1c anticipated on next visit 10/17/2022.

## 2022-09-19 NOTE — Patient Instructions (Addendum)
Today I am getting some lab work and I will send you a note about that.  I am also sending in a referral to nephrology.  If you have not heard from the nephrology office in the next 2 weeks to make an appointment, please let me know so I can nudge them from this side.  I think you have carpal tunnel syndrome in the right hand.  That is why we are trying the brace at night.  I would like to see you back in 6 to 8 weeks for follow-up of that.  Alternatives would be injection therapy, potential surgery.  We updated your COVID booster shot today.  You and I discussed the shingles vaccine and if you desire to get that, you can go to the pharmacy.  If you have any problems with that please let me know.  Cock up wrist splint.     Great to meet you again!

## 2022-09-19 NOTE — Patient Instructions (Addendum)
It was nice to see you today!  Your goal blood pressure is <130/80 mmHg.  Medication Changes: Continue taking metoprolol succinate (TOPROL-XL) 50 MG daily and spironolactone (ALDACTONE) 25 MG tablet  Decrease amLODipine (NORVASC) to 5 MG daily and doxazosin (CARDURA) 2 MG daily.  Stop taking losartan 100 mg daily  Stop taking hydrochlorothiazide 12.5 mg daily.   Start taking losartan-hydrochlorothiazide (HYZAAR) 100-12.5 MG once tablet daily.  Start adding on top of sliding scale 4 units insulin (Humulin) with lunch time meals.  Monitor blood pressure at home daily and keep a log (on your phone or piece of paper) to bring with you to your next visit. Write down date, time, blood pressure and pulse.  Keep up the good work with diet and exercise. Aim for a diet full of vegetables, fruit and lean meats (chicken, [place], fish). Try to limit salt intake by eating fresh or frozen vegetables (instead of canned), rinse canned vegetables prior to cooking and do not add any additional salt to meals.

## 2022-09-19 NOTE — Progress Notes (Signed)
S:     Chief Complaint  Patient presents with   Medication Management    HTN and T2DM f/u   77 y.o. female who presents for diabetes evaluation, education, and management.  PMH is significant for HTN, HLD, T2DM.  Patient was referred and last seen by Primary Care Provider, Dr. Leveda Anna, on 05/31/2022.  At last pharmacy visit, continued Tresiba (insulin degludec) at 24 units once daily, Humalog (insulin lispro) sliding scale was adjusted (120-150 take 8-10 units, 151-200 take 10-15 units, 201-250 take 15-18 units), titrated semaglutide (Ozempic) to reach 2 mg weekly dose (patient currently doing 1.5 mg weekly instead due to supply constraints), continued Jardiance (empagliflozin) and metformin.   Today, patient arrives in good spirits and presents with ambulatory device assistance.   Current diabetes medications include: Tresiba (insulin degludec) at 24 units once daily, Humalog (insulin lispro) sliding scale (120-150 take 8-10 units, 151-200 take 10-15 units, 201-250 take 15-18 units), titrated semaglutide (Ozempic) 1.5 mg weekly, empagliflozin (JARDIANCE) 25 MG daily, metformin 500 mg TID (takes BID instead). Current hypertension medications include: amLODipine (NORVASC) 10 mg daily, losartan 100 mg daily, hydrochlorothiazide 12.5 mg daily, doxazosin (CARDURA) 4 MG daily, metoprolol succinate (TOPROL-XL) 50 MG daily, and spironolactone (ALDACTONE) 25 MG tablet. Current hyperlipidemia medications include: Bempedoic Acid-Ezetimibe (NEXLIZET) 180-10 MG daily,  Current hyperlipidemia medications include: rosuvastatin (CRESTOR) 20 MG daily  Patient reports variable adherence. Reports not taking HTN and T2DM medications based on BP and glucose measurements. Also, patient shares concerns regarding pill burden.   Do you feel that your medications are working for you? yes Have you been experiencing any side effects to the medications prescribed? Yes (hypotension with antihypertensives). Do you have  any problems obtaining medications due to transportation or finances? no Insurance coverage: Medicare  Patient denies hypoglycemic events.  Patient denies nocturia (nighttime urination).  Patient reports neuropathy (nerve pain). Patient denies visual changes. Patient reports self foot exams.   Patient reported dietary habits: Eats mostly 2 meals/day  Within the past 12 months, did you worry whether your food would run out before you got money to buy more? no Within the past 12 months, did the food you bought run out, and you didn't have money to get more? no  O:   Review of Systems  All other systems reviewed and are negative.   Physical Exam Constitutional:      Appearance: Normal appearance.  Pulmonary:     Effort: Pulmonary effort is normal.  Neurological:     Mental Status: She is alert.  Psychiatric:        Mood and Affect: Mood normal.        Behavior: Behavior normal.        Thought Content: Thought content normal.     CGM Download:  % Time CGM is active: 99.4% Average Glucose: 183 mg/dL Glucose Management Indicator: 7.7%  Glucose Variability: 22.7% (goal <36%) Time in Goal:  - Time in range 70-180: 50% - Time above range: 50% - Time below range: 0% Observed patterns: Peaks that are above goal after lunch time meals.    Lab Results  Component Value Date   HGBA1C 8.0 (A) 09/19/2022   Vitals:   09/19/22 0913  BP: 135/83  Pulse: 69  SpO2: 96%    Lipid Panel     Component Value Date/Time   CHOL 148 12/12/2021 1222   TRIG 85 12/12/2021 1222   HDL 57 12/12/2021 1222   CHOLHDL 2.6 12/12/2021 1222   CHOLHDL 4.1  01/20/2015 1003   VLDL 10 01/20/2015 1003   LDLCALC 75 12/12/2021 1222   LDLDIRECT 96 03/23/2021 0931   LDLDIRECT 223 (H) 10/13/2015 0932    Clinical Atherosclerotic Cardiovascular Disease (ASCVD): No  The 10-year ASCVD risk score (Arnett DK, et al., 2019) is: 48.6%   Values used to calculate the score:     Age: 64 years     Sex:  Female     Is Non-Hispanic African American: Yes     Diabetic: Yes     Tobacco smoker: Yes     Systolic Blood Pressure: 136 mmHg     Is BP treated: Yes     HDL Cholesterol: 57 mg/dL     Total Cholesterol: 148 mg/dL    A/P: Diabetes longstanding currently under fair control. Patient is able to verbalize appropriate hypoglycemia management plan. Medication adherence appears variable with patient choosing to not take HTN and T2DM medications based on current BP and glucose, respectively. Control is suboptimal due to above goal blood glucose following lunch time meals. -Increased lunch time insulin (Humulin) by 4 units on top of sliding scale. Current sliding scale: 120-150 take 8-10 units, 151-200 take 10-15 units, 201-250 take 15-18 units. -Continued Tresiba (insulin degludec) at 24 units once daily, semaglutide (Ozempic) 1.5 mg weekly, empagliflozin (JARDIANCE) 25 MG daily, and metformin 500 mg TID (takes BID instead). -Patient instructed to download Dexcom G7 app on her mobile phone prior to next visit.  -Plan to initiate Dexcom G7 at next visit.  -Extensively discussed pathophysiology of diabetes, recommended lifestyle interventions, dietary effects on blood sugar control.  -Counseled on s/sx of and management of hypoglycemia.  -Next A1c anticipated on next visit 10/17/2022.    Hypertension longstanding and currently not at goal. Blood pressure goal of <130/80 mmHg. Medication adherence is suboptimal with patient reporting of choosing not to take BP medications due to hypotensive events and pill burden. Blood pressure control is suboptimal due to hypotensive events and pill burden. -Patient voiced difficulty with nightly dosing. Moved all of patient's antihypertensives in the morning. -Decrease amLODipine (NORVASC) to 5 MG daily. Patient instructed to take half dose of current supply of 10 mg tablets until finished.  -Decreased Doxazosin (CARDURA) 2 MG daily. Patient instructed to take half  dose of current supply of 10 mg tablets until finished.  -Discontinued losartan 100 mg daily -Discontinued hydrochlorothiazide 12.5 mg daily -Initiated losartan-hydrochlorothiazide (HYZAAR) 100-12.5 MG one tablet daily -Continued metoprolol succinate (TOPROL-XL) 50 MG daily and spironolactone (ALDACTONE) 25 MG tablet -encouraged patient to regularly monitor s/sx of hypotension -encouraged patient to regularly measure and log home BP readings -encouraged patient to adopt balanced diet (e.g., DASH diet), decrease salt consumption, and regular ambulation.    Written patient instructions provided. Patient verbalized understanding of treatment plan.  Total time in face to face counseling 32 minutes.    Follow-up:  Pharmacist 10/17/2022. No upcoming PCP appointment. Patient seen with Rebecca Hicks PharmD Candidate and Rebecca Hicks, PharmD Candidate.

## 2022-09-19 NOTE — Assessment & Plan Note (Signed)
Hypertension longstanding and currently not at goal. Blood pressure goal of <130/80 mmHg. Medication adherence is suboptimal with patient reporting of choosing not to take BP medications due to hypotensive events and pill burden. Blood pressure control is suboptimal due to hypotensive events and pill burden. -Patient voiced difficulty with nightly dosing. Moved all of patient's antihypertensives in the morning. -Decrease amLODipine (NORVASC) to 5 MG daily. Patient instructed to take half dose of current supply of 10 mg tablets until finished.  -Decreased Doxazosin (CARDURA) 2 MG daily. Patient instructed to take half dose of current supply of 10 mg tablets until finished.  -Discontinued losartan 100 mg daily -Discontinued hydrochlorothiazide 12.5 mg daily -Initiated losartan-hydrochlorothiazide (HYZAAR) 100-12.5 MG one tablet daily -Continued metoprolol succinate (TOPROL-XL) 50 MG daily and spironolactone (ALDACTONE) 25 MG tablet -encouraged patient to regularly monitor s/sx of hypotension -encouraged patient to regularly measure and log home BP readings -encouraged patient to adopt balanced diet (e.g., DASH diet), decrease salt consumption, and regular ambulation.

## 2022-09-20 ENCOUNTER — Encounter: Payer: Self-pay | Admitting: Family Medicine

## 2022-09-20 DIAGNOSIS — Z0000 Encounter for general adult medical examination without abnormal findings: Secondary | ICD-10-CM | POA: Insufficient documentation

## 2022-09-20 DIAGNOSIS — G5601 Carpal tunnel syndrome, right upper limb: Secondary | ICD-10-CM | POA: Insufficient documentation

## 2022-09-20 LAB — BASIC METABOLIC PANEL
BUN/Creatinine Ratio: 19 (ref 12–28)
BUN: 33 mg/dL — ABNORMAL HIGH (ref 8–27)
CO2: 23 mmol/L (ref 20–29)
Calcium: 9.5 mg/dL (ref 8.7–10.3)
Chloride: 101 mmol/L (ref 96–106)
Creatinine, Ser: 1.75 mg/dL — ABNORMAL HIGH (ref 0.57–1.00)
Glucose: 138 mg/dL — ABNORMAL HIGH (ref 70–99)
Potassium: 4.8 mmol/L (ref 3.5–5.2)
Sodium: 138 mmol/L (ref 134–144)
eGFR: 30 mL/min/{1.73_m2} — ABNORMAL LOW (ref >59)

## 2022-09-20 LAB — MICROALBUMIN / CREATININE URINE RATIO
Creatinine, Urine: 75.6 mg/dL
Microalb/Creat Ratio: 333 mg/g creat — ABNORMAL HIGH (ref 0–29)
Microalbumin, Urine: 251.6 ug/mL

## 2022-09-20 NOTE — Assessment & Plan Note (Signed)
I reviewed her lab work.  Will repeat urine creatinine ratio and BMP today.  I do think it is probably time to go ahead and get her referred to nephrology so they can follow as well.  She is in agreement with this and I placed referral today.  She is on semaglutide and ARB as well as empagliflozin.

## 2022-09-20 NOTE — Assessment & Plan Note (Signed)
Clinically this is most consistent with carpal tunnel syndrome.  We discussed at length.  Will start a trial of night splints with cock up wrist splint.  We did not have 1 in her size so I gave her information on how to obtain 1.  She will wear it only at night but every night for the next 6 to 8 weeks and then follow-up in clinic.  If with no improvement, would consider reevaluation for injection versus nerve conduction studies.  She is very happy with this plan and would like to treat it conservatively if possible.

## 2022-09-20 NOTE — Assessment & Plan Note (Signed)
Reviewed health maintenance.  She does not have the shingles shot in her records.  We discussed.  She would like to consider that a little bit longer.  We did update her COVID-vaccine booster today.  She has a diabetic eye exam in 3 weeks.

## 2022-09-20 NOTE — Progress Notes (Signed)
    CHIEF COMPLAINT / HPI: #1.  Right hand numbness and tingling for the last several weeks.  Bothers her particularly when she is writing.  She is right-hand dominant.  Numbness and pain is mostly in her thumb and first 2 fingers.  Feels like this hand is more clumsy at times when she is having this numbness. 2.  Transitioning to my practice as Dr. Leveda Anna has retired.  He had told her to come back for some repeat lab work related to her kidney function and blood pressure and she is here for that today.  Tells me she has 1 sister who has had kidney transplant and another half sister who also has kidney transplant. #3.  Wants to review health maintenance as she wants to keep up-to-date on that.  Especially wants to also check her diabetic control.   PERTINENT  PMH / PSH: I have reviewed the patient's medications, allergies, past medical and surgical history, smoking status and updated in the EMR as appropriate.   OBJECTIVE:  BP 136/78   Pulse 67   Ht 5' 4.5" (1.638 m)   Wt 253 lb (114.8 kg)   SpO2 97%   BMI 42.76 kg/m  Vital signs reviewed. GENERAL: Well-developed, well-nourished, no acute distress. MSK: Positive Tinel on the right wrist.  There is no thenar or hypothenar atrophy in either hand.  She has normal grip strength bilaterally. VASCULAR: Radial pulses 2+ bilaterally are symmetrical with normal cap refill. PSYCH: AxOx4. Good eye contact.. No psychomotor retardation or agitation. Appropriate speech fluency and content. Asks and answers questions appropriately. Mood is congruent.    ASSESSMENT / PLAN:   Carpal tunnel syndrome of right wrist Clinically this is most consistent with carpal tunnel syndrome.  We discussed at length.  Will start a trial of night splints with cock up wrist splint.  We did not have 1 in her size so I gave her information on how to obtain 1.  She will wear it only at night but every night for the next 6 to 8 weeks and then follow-up in clinic.  If with no  improvement, would consider reevaluation for injection versus nerve conduction studies.  She is very happy with this plan and would like to treat it conservatively if possible.  Healthcare maintenance Reviewed health maintenance.  She does not have the shingles shot in her records.  We discussed.  She would like to consider that a little bit longer.  We did update her COVID-vaccine booster today.  She has a diabetic eye exam in 3 weeks.  CKD (chronic kidney disease) stage 3, GFR 30-59 ml/min (HCC) I reviewed her lab work.  Will repeat urine creatinine ratio and BMP today.  I do think it is probably time to go ahead and get her referred to nephrology so they can follow as well.  She is in agreement with this and I placed referral today.  She is on semaglutide and ARB as well as empagliflozin.   Denny Levy MD

## 2022-09-21 ENCOUNTER — Encounter: Payer: Self-pay | Admitting: Family Medicine

## 2022-09-21 NOTE — Progress Notes (Signed)
Reviewed and agree with Dr Koval's plan.   

## 2022-09-25 NOTE — Telephone Encounter (Signed)
Rec'd 4 boxes of ozempic 2mg  dose pens.   Will call pt for pickup.

## 2022-09-25 NOTE — Telephone Encounter (Signed)
Called patient to inform of novo nordisk shipment ready for pickup.  4 boxes of ozempic 2mg  dose pens (4 month supply) are labeled and ready for pickup in med room fridge)

## 2022-10-01 ENCOUNTER — Telehealth: Payer: Self-pay

## 2022-10-01 NOTE — Telephone Encounter (Signed)
Patient calls nurse line requesting to speak with Dr. Raymondo Band or Dr. Jennette Kettle regarding BP.   She reports that she has previously been taking all BP medications in the morning, however, this AM she noticed that she had a headache with low BP.   Reports lowest reading of 75/44. Around 1300, improved slightly to 84/44.   Checked BP while on the phone. BP was 108/56. Currently asymptomatic.   She states that she is going to start taking amlodipine and doxazosin in the evening and other medications in the morning.   Patient states that she will continue to take BP and keep log. She states that she will call back in tomorrow if BP becomes low or if she is symptomatic.   Red flags discussed.   Will forward to PCP and Dr. Raymondo Band.   Veronda Prude, RN

## 2022-10-05 NOTE — Telephone Encounter (Signed)
Called patient to check on her. She reports that her blood pressure has been better since she has separated out her BP medications.   Reports most recent BP of 122/80. They have been ranging 120's/80's.   She is not having any symptoms at this time.   She will keep follow up with Dr. Raymondo Band on 6/26.  Veronda Prude, RN

## 2022-10-08 NOTE — Telephone Encounter (Signed)
Contacted patient to follow-up with blood pressure reading concerns.   Patient reported home readings over the last 24-36 hours of 123/71 and 107/66  She reports improved adherence with her blood pressure medications since last visit.  She noticed low readings more consistently after moving all blood pressure medications to the AM.  She recently moved her amlodipine and metoprolol back to PM dosing.  Reported intermittent dizziness with current regimen.   Suggested change of doxazosin to PM dosing in attempt to minimize dizziness.  Discussed plan to continue with home blood pressure assessments until our scheduled follow-up in ~ 1 week.    No dose changes today.

## 2022-10-09 NOTE — Telephone Encounter (Signed)
Reviewed and agree with Dr Koval's plan.   

## 2022-10-17 ENCOUNTER — Ambulatory Visit (INDEPENDENT_AMBULATORY_CARE_PROVIDER_SITE_OTHER): Payer: Medicare Other | Admitting: Pharmacist

## 2022-10-17 ENCOUNTER — Encounter: Payer: Self-pay | Admitting: Pharmacist

## 2022-10-17 VITALS — BP 141/89 | HR 69 | Wt 256.0 lb

## 2022-10-17 DIAGNOSIS — E1122 Type 2 diabetes mellitus with diabetic chronic kidney disease: Secondary | ICD-10-CM

## 2022-10-17 DIAGNOSIS — N1832 Chronic kidney disease, stage 3b: Secondary | ICD-10-CM | POA: Diagnosis not present

## 2022-10-17 DIAGNOSIS — I10 Essential (primary) hypertension: Secondary | ICD-10-CM

## 2022-10-17 NOTE — Patient Instructions (Signed)
It was nice to see you today!  Your goal blood sugar is 80-130 before eating and less than 180 after eating.  Medication Changes: Continue all medications the same - no changes today.   Enjoy your new Dexcom - G7  Keep up the good work with diet and exercise. Aim for a diet full of vegetables, fruit and lean meats (chicken, [place], fish). Try to limit salt intake by eating fresh or frozen vegetables (instead of canned), rinse canned vegetables prior to cooking and do not add any additional salt to meals.

## 2022-10-17 NOTE — Assessment & Plan Note (Signed)
Diabetes longstanding and fairly well controlled. Currently transitioning from Dexcom G6 to G7. Patient has supply of G7 sensors enroute to her house.  We initiated the Dexcom G7 today with use of a sample sensor. Patient excited to use phone in place of G6 receiver/transmitter and sensor.  -Continue all diabetes therapy the same.  -Extensively discussed pathophysiology of diabetes, recommended lifestyle interventions, dietary effects on blood sugar control.  -Counseled on s/sx of and management of hypoglycemia.  -Next A1c anticipated at next follow-up with PCP.

## 2022-10-17 NOTE — Assessment & Plan Note (Signed)
Hypertension longstanding and currently near goal based on home readings and in office assessment. Blood pressure goal remains <130/80 mmHg if possible due to family history of kidney disease.   Medication adherence reported.  -Continued all medications without change. Given multiple low readings with systolics < 100 which are inconsistent in frquency but often occur with headaches, she was asked to monitor symptoms and frequency.  Return if concerning change.

## 2022-10-17 NOTE — Progress Notes (Signed)
S:     Chief Complaint  Patient presents with   Medication Management    Diabetes and Hypertension - Dexcom G7 initiation   77 y.o. female who presents for diabetes evaluation, education, and management.  PMH is significant for hypertension, hyperlipidemia and diabetes.  Patient was referred and last seen by Primary Care Provider, Dr. Jennette Kettle, on 09/19/2022.   At last visit with me, we discussed changed to [facility] G7 today (previously using G6.   Today, patient arrives in good spirits and presents with assistance of a cane.  She is excited to have a financial windfall - resolution of a [place] government mistake.   She did not yet receive her shipment of G7 sensors.   Family/Social History: doing well.   Current diabetes medications include: Tresiba (insulin degludec) at 24 units once daily, Humalog (insulin lispro) sliding scale (120-150 take 8-10 units, 151-200 take 10-15 units, 201-250 take 15-18 units), titrated semaglutide (Ozempic) 1.5 mg weekly, empagliflozin (JARDIANCE) 25 MG daily, metformin 500 mg TID (takes BID instead). Current hypertension medications include: amlodipine (10 mg daily, losartan 100 mg daily, hydrochlorothiazide 12.5 mg daily, doxazosin 2 mg daily, metoprolol succinate (TOPROL-XL) 50 mg daily, and spironolactone 25 mg tablet. Current hyperlipidemia medications include: rosuvastatin (CRESTOR) 20 MG daily, Bempedoic Acid-Ezetimibe (NEXLIZET) 180-10 MG daily,  Patient reports adherence to taking all medications as prescribed.   Do you feel that your medications are working for you? yes Have you been experiencing any side effects to the medications prescribed? Yes - reports headaches with low blood pressure readings.  Do you have any problems obtaining medications due to transportation or finances? no  Patient denies hypoglycemic events.  Patient reported dietary habits: Eats mostly 1 larger meal/day  Patient-reported exercise habits: limited due to knee  pain  Patient shared home blood pressure readings. Majority of readings were at or near goal blood pressure of <130/80 mmHg.    O:   Review of Systems  All other systems reviewed and are negative.   Physical Exam Constitutional:      Appearance: Normal appearance.  Pulmonary:     Effort: Pulmonary effort is normal.  Neurological:     Mental Status: She is alert.  Psychiatric:        Mood and Affect: Mood normal.        Behavior: Behavior normal.        Thought Content: Thought content normal.        Judgment: Judgment normal.   Deferred CGM Download as we are transitioning to [facility] G7 and will use data sharing at next visit.    Lab Results  Component Value Date   HGBA1C 8.0 (A) 09/19/2022   Vitals:   10/17/22 1012 10/17/22 1051  BP: (!) 147/68 (!) 141/89  Pulse: 69   SpO2: 100%     Lipid Panel     Component Value Date/Time   CHOL 148 12/12/2021 1222   TRIG 85 12/12/2021 1222   HDL 57 12/12/2021 1222   CHOLHDL 2.6 12/12/2021 1222   CHOLHDL 4.1 01/20/2015 1003   VLDL 10 01/20/2015 1003   LDLCALC 75 12/12/2021 1222   LDLDIRECT 96 03/23/2021 0931   LDLDIRECT 223 (H) 10/13/2015 0932    Clinical Atherosclerotic Cardiovascular Disease (ASCVD): No  (had calcification) The 10-year ASCVD risk score (Arnett DK, et al., 2019) is: 50.5%   Values used to calculate the score:     Age: 81 years     Sex: Female     Is  Non-Hispanic African American: Yes     Diabetic: Yes     Tobacco smoker: Yes     Systolic Blood Pressure: 141 mmHg     Is BP treated: Yes     HDL Cholesterol: 57 mg/dL     Total Cholesterol: 148 mg/dL   A/P: Diabetes longstanding and fairly well controlled. Currently transitioning from Dexcom G6 to G7. Patient has supply of G7 sensors enroute to her house.  We initiated the Dexcom G7 today with use of a sample sensor. Patient excited to use phone in place of G6 receiver/transmitter and sensor.  -Continue all diabetes therapy the same.  -Extensively  discussed pathophysiology of diabetes, recommended lifestyle interventions, dietary effects on blood sugar control.  -Counseled on s/sx of and management of hypoglycemia.  -Next A1c anticipated at next follow-up with PCP.   Hypertension longstanding and currently near goal based on home readings and in office assessment. Blood pressure goal remains <130/80 mmHg if possible due to family history of kidney disease.   Medication adherence reported.  -Continued all medications without change. Given multiple low readings with systolics < 100 which are inconsistent in frquency but often occur with headaches, she was asked to monitor symptoms and frequency.  Return if concerning change.    Written patient instructions provided. Patient verbalized understanding of treatment plan.  Total time in face to face counseling 33 minutes.    Follow-up:  Pharmacist ~ 5 weeks. PCP clinic visit in [place].

## 2022-10-18 NOTE — Progress Notes (Signed)
Reviewed and agree with Dr Koval's plan.   

## 2022-10-30 ENCOUNTER — Other Ambulatory Visit: Payer: Self-pay

## 2022-10-30 DIAGNOSIS — I10 Essential (primary) hypertension: Secondary | ICD-10-CM

## 2022-10-30 MED ORDER — SPIRONOLACTONE 25 MG PO TABS: 25.0000 mg | ORAL_TABLET | Freq: Every day | ORAL | 3 refills | Status: DC

## 2022-11-06 ENCOUNTER — Ambulatory Visit: Payer: Medicare Other | Admitting: *Deleted

## 2022-11-06 DIAGNOSIS — Z0000 Encounter for general adult medical examination without abnormal findings: Secondary | ICD-10-CM | POA: Diagnosis not present

## 2022-11-06 NOTE — Progress Notes (Signed)
Subjective:   Rebecca Hicks is a 77 y.o. female who presents for an Initial Medicare Annual Wellness Visit.  Visit Complete: Virtual  I connected with  Rebecca Hicks on 11/06/22 by a audio enabled telemedicine application and verified that I am speaking with the correct person using two identifiers.  Patient Location: Home  Provider Location: Home Office  I discussed the limitations of evaluation and management by telemedicine. The patient expressed understanding and agreed to proceed.    Review of Systems     Cardiac Risk Factors include: advanced age (>36men, >75 women);hypertension;diabetes mellitus;family history of premature cardiovascular disease;obesity (BMI >30kg/m2)     Objective:    Today's Vitals   There is no height or weight on file to calculate BMI.     11/06/2022    9:35 AM 04/19/2022   11:09 AM 11/27/2021   11:23 AM 06/19/2021    9:37 AM 05/04/2021   10:50 AM 11/02/2020    2:28 PM 06/29/2019   11:54 AM  Advanced Directives  Does Patient Have a Medical Advance Directive? No No No No No No No  Would patient like information on creating a medical advance directive? No - Patient declined No - Patient declined  No - Patient declined No - Patient declined No - Patient declined No - Patient declined    Current Medications (verified) Outpatient Encounter Medications as of 11/06/2022  Medication Sig   acetaminophen (TYLENOL) 650 MG CR tablet Take 650 mg by mouth every 8 (eight) hours as needed.    albuterol (PROAIR HFA) 108 (90 Base) MCG/ACT inhaler Inhale 2 puffs into the lungs every 6 (six) hours as needed for wheezing or shortness of breath.   amLODipine (NORVASC) 5 MG tablet Take 1 tablet (5 mg total) by mouth daily.   aspirin 81 MG tablet Take 81 mg by mouth daily.   Bempedoic Acid-Ezetimibe (NEXLIZET) 180-10 MG TABS TAKE 1 TABLET BY MOUTH DAILY   Continuous Blood Gluc Receiver (DEXCOM G6 RECEIVER) DEVI 1 Device by Does not apply route as directed. Use  with Dexcom G6 sensor and transmitter   doxazosin (CARDURA) 2 MG tablet Take 1 tablet (2 mg total) by mouth daily.   DULoxetine (CYMBALTA) 60 MG capsule Take 1 capsule (60 mg total) by mouth daily.   empagliflozin (JARDIANCE) 25 MG TABS tablet Take 1 tablet (25 mg total) by mouth daily.   insulin degludec (TRESIBA FLEXTOUCH) 100 UNIT/ML FlexTouch Pen Inject 25 Units into the skin daily. (Patient taking differently: Inject 24 Units into the skin daily.)   Insulin Lispro w/ Trans Port 100 UNIT/ML SOPN Inject 10-18 Units into the skin 2 (two) times daily. Add 4 units to lunch time dose.   Insulin Pen Needle (B-D ULTRAFINE III SHORT PEN) 31G X 8 MM MISC USE TO INJECT INSULIN 4 times daily.   losartan-hydrochlorothiazide (HYZAAR) 100-12.5 MG tablet Take 1 tablet by mouth daily.   metFORMIN (GLUCOPHAGE-XR) 500 MG 24 hr tablet TAKE 1 TABLET BY MOUTH 3  TIMES DAILY BEFORE MEALS   metoprolol succinate (TOPROL-XL) 50 MG 24 hr tablet Take 1 tablet (50 mg total) by mouth daily. Take with or immediately following a meal.   nitroGLYCERIN (NITROSTAT) 0.4 MG SL tablet Place 1 tablet (0.4 mg total) under the tongue every 5 (five) minutes as needed for chest pain.   omeprazole (PRILOSEC) 40 MG capsule TAKE 1 CAPSULE(40 MG) BY MOUTH DAILY   rosuvastatin (CRESTOR) 20 MG tablet TAKE 1 TABLET BY MOUTH DAILY   Semaglutide,  2 MG/DOSE, (OZEMPIC, 2 MG/DOSE,) 8 MG/3ML SOPN Inject 2 mg into the skin once a week.   spironolactone (ALDACTONE) 25 MG tablet Take 1 tablet (25 mg total) by mouth daily.   timolol (TIMOPTIC) 0.5 % ophthalmic solution Place 1 drop into both eyes 2 times daily.   No facility-administered encounter medications on file as of 11/06/2022.    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  Watch for overdiuresis.    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 - She was fitted with a Small size [employer] Full Face Mask Simplus mask and heated humidification.     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, POSITIVE 02/07/2007   Annotation: with clear CXR mid 1990's Qualifier: History of  By: Laural Benes RN, Erika     TOBACCO USE, QUIT 02/07/2007   Qualifier: Diagnosis of  By: Leveda Anna MD, William     Tremor of both hands 12/15/2015   U R I 03/15/2010   Qualifier: Diagnosis of  By: Leveda Anna MD, Chrissie Noa     Past Surgical History:  Procedure Laterality Date   ABDOMINAL HYSTERECTOMY     APPENDECTOMY  CATARACT EXTRACTION, BILATERAL Bilateral 03/2010, 04/2010   COLONOSCOPY  2011   POLYPECTOMY  2011   polyps, hems    SPINE SURGERY     lumbar laminectomy X 2   TONSILLECTOMY     Family History  Problem Relation Age of Onset   Cervical cancer Mother    Stroke Father    Alcohol abuse Father    Kidney failure Sister        s/p transplant   Heart disease Brother    Alcohol abuse Brother    Stroke Paternal Grandmother    Breast cancer Maternal Aunt    Pancreatic cancer Maternal Uncle    Hypertension Other        siblings   Diabetes Other        siblings   Colon cancer Neg Hx    Esophageal cancer Neg Hx    Rectal cancer Neg Hx    Stomach cancer Neg Hx    Colon polyps Neg Hx    Crohn's disease Neg Hx    Social History   Socioeconomic History   Marital status: Single    Spouse name: Not on file   Number of children: 0   Years of education: college   Highest education level: Not on file  Occupational History   Occupation: retired    Associate Professor: Social worker [employer]  Tobacco Use   Smoking status: Former    Current packs/day: 0.00    Average packs/day: 1 pack/day for 20.0 years (20.0 ttl pk-yrs)    Types: Cigarettes    Start date: 04/24/1975    Quit date: 04/24/1995    Years since quitting: 27.5    Passive exposure: Never   Smokeless tobacco: Never  Vaping Use   Vaping status:  Never Used  Substance and Sexual Activity   Alcohol use: Yes    Alcohol/week: 0.0 standard drinks of alcohol    Comment: rarely   Drug use: No   Sexual activity: Not Currently  Other Topics Concern   Not on file  Social History Narrative   Lives with 2 sisters and a nephew in a 2 story home but stays on the first floor.  Retired from the [employer].  Education: college.    Social Determinants of Health   Financial Resource Strain: Low Risk  (11/06/2022)   Overall Financial Resource Strain (CARDIA)    Difficulty of Paying Living Expenses: Not hard at all  Food Insecurity: No Food Insecurity (11/06/2022)   Hunger Vital Sign    Worried About Running Out of Food in the Last Year: Never true    Ran Out of Food in the Last Year: Never true  Transportation Needs: No Transportation Needs (11/06/2022)   PRAPARE - Administrator, Civil Service (Medical): No    Lack of Transportation (Non-Medical): No  Physical Activity: Inactive (11/06/2022)   Exercise Vital Sign    Days of Exercise per Week: 0 days    Minutes of Exercise per Session: 0 min  Stress: No Stress Concern Present (11/06/2022)   [employer] of Occupational Health - Occupational Stress Questionnaire    Feeling of Stress : Not at all  Social Connections: Unknown (11/06/2022)   Social Connection and Isolation Panel [NHANES]    Frequency of Communication with Friends and Family: Three times a week    Frequency of Social Gatherings with Friends and Family: Once a week    Attends Religious Services: Never    Database administrator or Organizations:  No    Attends Banker Meetings: Never    Marital Status: Not on file    Tobacco Counseling Counseling given: Not Answered   Clinical Intake:  Pre-visit preparation completed: Yes  Pain : No/denies pain     Diabetes: Yes CBG done?: No Did pt. bring in CBG monitor from home?: No  How often do you need to have someone help you when you read  instructions, pamphlets, or other written materials from your doctor or pharmacy?: 1 - Never  Interpreter Needed?: No  Information entered by :: Remi Haggard LPN   Activities of Daily Living    11/06/2022    9:43 AM  In your present state of health, do you have any difficulty performing the following activities:  Hearing? 0  Vision? 0  Difficulty concentrating or making decisions? 0  Walking or climbing stairs? 1  Dressing or bathing? 0  Doing errands, shopping? 0  Preparing Food and eating ? N  Using the Toilet? N  In the past six months, have you accidently leaked urine? Y  Do you have problems with loss of bowel control? N  Managing your Medications? N  Managing your Finances? N  Housekeeping or managing your Housekeeping? N    Patient Care Team: Caro Laroche, DO as PCP - General (Family Medicine) Nahser, Deloris Ping, MD as PCP - Cardiology (Cardiology) Lunette Stands, MD as Consulting Physician (Orthopedic Surgery)  Indicate any recent Medical Services you may have received from other than [facility] providers in the past year (date may be approximate).     Assessment:   This is a routine wellness examination for Rebecca Hicks.  Hearing/Vision screen Hearing Screening - Comments:: Patient does not wear hearing aids Vision Screening - Comments:: Up to date [facility] [place]  Dietary issues and exercise activities discussed:     Goals Addressed             This Visit's Progress    Weight (lb) < 200 lb (90.7 kg)       Getting trust and will together       Depression Screen    11/06/2022    9:39 AM 05/31/2022   10:43 AM 04/19/2022   11:09 AM 11/27/2021   11:20 AM 06/19/2021    9:41 AM 03/23/2021    8:43 AM 12/21/2020    1:39 PM  PHQ 2/9 Scores  PHQ - 2 Score 0 0 0 0 0 1 2  PHQ- 9 Score 0 2 5 3 3 5 8     Fall Risk    11/06/2022    9:35 AM 05/31/2022   10:43 AM 04/19/2022   11:10 AM 06/19/2021    9:41 AM 11/02/2020    2:27 PM  Fall Risk   Falls in the  past year? 0 0 0 0 0  Number falls in past yr: 0  0  0  Injury with Fall? 0  0  0  Follow up Falls evaluation completed;Education provided;Falls prevention discussed        MEDICARE RISK AT HOME:  Medicare Risk at Home - 11/06/22 0934     Any stairs in or around the home? Yes    If so, are there any without handrails? No    Home free of loose throw rugs in walkways, pet beds, electrical cords, etc? Yes    Adequate lighting in your home to reduce risk of falls? Yes    Life alert? No    Use of a  cane, walker or w/c? Yes    Grab bars in the bathroom? Yes    Shower chair or bench in shower? No    Elevated toilet seat or a handicapped toilet? Yes             TIMED UP AND GO:  Was the test performed? No    Cognitive Function:        11/06/2022    9:42 AM  6CIT Screen  What Year? 0 points  What month? 0 points  What time? 0 points  Count back from 20 0 points  Months in reverse 0 points  Repeat phrase 0 points  Total Score 0 points    Immunizations Immunization History  Administered Date(s) Administered   COVID-19, mRNA, vaccine(Comirnaty)12 years and older 09/19/2022   Fluad Quad(high Dose 65+) 02/19/2019, 03/23/2021, 04/10/2022   Influenza Split 02/13/2011, 03/12/2012   Influenza Whole 04/03/2007, 02/23/2008, 02/25/2009   Influenza, High Dose Seasonal PF 02/26/2018   Influenza,inj,Quad PF,6+ Mos 03/04/2013, 04/07/2014, 02/15/2015, 12/15/2015, 03/13/2017, 04/05/2020   PFIZER(Purple Top)SARS-COV-2 Vaccination 06/13/2019, 07/07/2019, 04/05/2020   Pfizer Covid-19 Vaccine Bivalent Booster 24yrs & up 03/23/2021   Pneumococcal Conjugate-13 04/07/2014   Pneumococcal Polysaccharide-23 03/12/2012   Td 02/25/2009   Tdap 03/26/2022    TDAP status: Up to date  Flu Vaccine status: Up to date  Pneumococcal vaccine status: Up to date  Covid-19 vaccine status: Information provided on how to obtain vaccines.   Qualifies for Shingles Vaccine? Yes   Zostavax completed No    Shingrix Completed?: No.    Education has been provided regarding the importance of this vaccine. Patient has been advised to call insurance company to determine out of pocket expense if they have not yet received this vaccine. Advised may also receive vaccine at local pharmacy or Health Dept. Verbalized acceptance and understanding.  Screening Tests Health Maintenance  Topic Date Due   Zoster Vaccines- Shingrix (1 of 2) Never done   OPHTHALMOLOGY EXAM  02/24/2022   INFLUENZA VACCINE  11/22/2022   COVID-19 Vaccine (6 - 2023-24 season) 01/20/2023   HEMOGLOBIN A1C  03/22/2023   FOOT EXAM  03/27/2023   Diabetic kidney evaluation - eGFR measurement  09/19/2023   Diabetic kidney evaluation - Urine ACR  09/19/2023   Medicare Annual Wellness (AWV)  11/06/2023   Colonoscopy  08/16/2026   DTaP/Tdap/Td (3 - Td or Tdap) 03/26/2032   Pneumonia Vaccine 65+ Years old  Completed   DEXA SCAN  Completed   Hepatitis C Screening  Completed   HPV VACCINES  Aged Out    Health Maintenance  Health Maintenance Due  Topic Date Due   Zoster Vaccines- Shingrix (1 of 2) Never done   OPHTHALMOLOGY EXAM  02/24/2022    Colorectal cancer screening: No longer required.   Mammogram status: Completed  . Repeat every year  Bone Density status: Completed 2019. Results reflect: Bone density results: NORMAL. Repeat every 3-5 years.  Lung Cancer Screening: (Low Dose CT Chest recommended if Age 19-80 years, 20 pack-year currently smoking OR have quit w/in 15years.) does not qualify.   Lung Cancer Screening Referral:   Additional Screening:  Hepatitis C Screening: does not qualify; Completed 2017  Vision Screening: Recommended annual ophthalmology exams for early detection of glaucoma and other disorders of the eye. Is the patient up to date with their annual eye exam?  Yes  Who is the provider or what is the name of the office in which the patient attends annual eye exams? [facility] If pt  is not established with  a provider, would they like to be referred to a provider to establish care? No .   Dental Screening: Recommended annual dental exams for proper oral hygiene  Nutrition Risk Assessment:  Has the patient had any N/V/D within the last 2 months?  No  Does the patient have any non-healing wounds?  No  Has the patient had any unintentional weight loss or weight gain?  No   Diabetes:  Is the patient diabetic?  Yes  If diabetic, was a CBG obtained today?  No  Did the patient bring in their glucometer from home?  No  How often do you monitor your CBG's? Dexacom.   Financial Strains and Diabetes Management:  Are you having any financial strains with the device, your supplies or your medication? No .  Does the patient want to be seen by Chronic Care Management for management of their diabetes?  No  Would the patient like to be referred to a Nutritionist or for Diabetic Management?  No   Diabetic Exams:  Diabetic Eye Exam: Completed .  +Pt has been advised about the importance in completing this exam. A referral has been placed today. Message sent to referral coordinator for scheduling purposes. Advised pt to expect a call from office referred to regarding appt.  Diabetic Foot Exam: Completed 2024. Pt has been advised about the importance in completing this exam. .   Community Resource Referral / Chronic Care Management: CRR required this visit?  No   CCM required this visit?  No     Plan:     I have personally reviewed and noted the following in the patient's chart:   Medical and social history Use of alcohol, tobacco or illicit drugs  Current medications and supplements including opioid prescriptions. Patient is not currently taking opioid prescriptions. Functional ability and status Nutritional status Physical activity Advanced directives List of other physicians Hospitalizations, surgeries, and ER visits in previous 12 months Vitals Screenings to include cognitive,  depression, and falls Referrals and appointments  In addition, I have reviewed and discussed with patient certain preventive protocols, quality metrics, and best practice recommendations. A written personalized care plan for preventive services as well as general preventive health recommendations were provided to patient.     Remi Haggard, LPN   1/88/4166   After Visit Summary: (MyChart) Due to this being a telephonic visit, the after visit summary with patients personalized plan was offered to patient via MyChart   Nurse Notes:

## 2022-11-14 ENCOUNTER — Other Ambulatory Visit: Payer: Self-pay | Admitting: Gastroenterology

## 2022-11-14 NOTE — Telephone Encounter (Signed)
Dr Lavon Paganini,  This is a patient Dr Christella Hartigan.  Please advise if OK to refill as you are DOD pm.

## 2022-11-26 ENCOUNTER — Ambulatory Visit: Payer: Medicare Other | Admitting: Pharmacist

## 2022-12-10 ENCOUNTER — Other Ambulatory Visit: Payer: Self-pay | Admitting: Family Medicine

## 2022-12-10 ENCOUNTER — Ambulatory Visit: Payer: Medicare Other | Admitting: Pharmacist

## 2022-12-10 ENCOUNTER — Encounter: Payer: Self-pay | Admitting: Pharmacist

## 2022-12-10 VITALS — BP 124/71 | HR 67 | Ht 65.0 in | Wt 235.8 lb

## 2022-12-10 DIAGNOSIS — I10 Essential (primary) hypertension: Secondary | ICD-10-CM

## 2022-12-10 DIAGNOSIS — H9193 Unspecified hearing loss, bilateral: Secondary | ICD-10-CM

## 2022-12-10 DIAGNOSIS — N1832 Chronic kidney disease, stage 3b: Secondary | ICD-10-CM | POA: Diagnosis not present

## 2022-12-10 DIAGNOSIS — E1122 Type 2 diabetes mellitus with diabetic chronic kidney disease: Secondary | ICD-10-CM | POA: Diagnosis not present

## 2022-12-10 MED ORDER — ACCU-CHEK SMARTVIEW VI STRP: ORAL_STRIP | 12 refills | Status: AC

## 2022-12-10 MED ORDER — ACCU-CHEK SOFTCLIX LANCETS MISC: 12 refills | Status: AC

## 2022-12-10 MED ORDER — ACCU-CHEK GUIDE ME W/DEVICE KIT
1.0000 | PACK | Freq: Once | 0 refills | Status: AC
Start: 2022-12-10 — End: 2022-12-10

## 2022-12-10 MED ORDER — OMEPRAZOLE 40 MG PO CPDR: 40.0000 mg | DELAYED_RELEASE_CAPSULE | Freq: Every day | ORAL | 0 refills | Status: DC | PRN

## 2022-12-10 NOTE — Patient Instructions (Signed)
It was nice to see you today!  Your goal blood sugar is 80-130 before eating and less than 180 after eating.  Medication Changes: Discontinue Cardura (doxasozin) 2 mg once daily.  Continue all other medication the same.   Keep up the good work with diet and exercise. Aim for a diet full of vegetables, fruit and lean meats (chicken, [place], fish). Try to limit salt intake by eating fresh or frozen vegetables (instead of canned), rinse canned vegetables prior to cooking and do not add any additional salt to meals.

## 2022-12-10 NOTE — Assessment & Plan Note (Signed)
Diabetes longstanding and fairly well controlled. Assisted patient with downloading Clarity app for Dexcom G7 and connecting with our practice. Patient is able to verbalize appropriate hypoglycemia management plan. Medication adherence appears optimal. Weight loss of ~ 20 lbs since last visit.  - Prescribed Accu-Check Guide device, strips and lancets per patient's request to check blood sugar when removing old Dexcom sensor and placing new one or any symptomatic episode. - Continued Jardiance (empagliflozin) 25 mg once daily. - Continued Tresiba (insulin degludec) 25 units into the skin daily. - Continued Insulin Humalog (insulin lispro) 10-18 units into the skin twice daily with 4 units added to lunch time dose. - Continued Glucophage-XR (metformin) 500 mg three times daily before meals. - Continued Ozempic (semaglutide) 1.5 mg into the skin once a week. -Patient educated on purpose, proper use, and potential adverse effects. -Extensively discussed pathophysiology of diabetes, recommended lifestyle interventions, dietary effects on blood sugar control.

## 2022-12-10 NOTE — Assessment & Plan Note (Signed)
Hypertension longstanding and currently mostly at goal based on home readings and in office assessment. Blood pressure goal of <149mmHg systolic if tolerated. Medication adherence is optimal. - Continued Norvasc (amlodipine) 5 mg once daily. - Continued Hyzaar (losartan-hydrochlorothiazide) 100-12.5 mg once daily. - Continued Toprol-XL (metoprolol succinate) 50 mg once daily. - Continued Aldactone (spironolactone) 25 mg once daily. - Discontinued Cardura (doxasozin) 2 mg once daily. If blood pressure increases at next visit, consider restarting doxazosin at 1 mg daily.

## 2022-12-10 NOTE — Assessment & Plan Note (Signed)
ASCVD risk - primary prevention in patient with diabetes. Last LDL is 96 not at goal of <70 mg/dL.  - Continued Crestor (rosuvastatin) 20 mg once daily. - Continued Nexlizet (bempedoic acid-ezetimibe) 180-10 mg once daily.

## 2022-12-10 NOTE — Progress Notes (Signed)
S:     Chief Complaint  Patient presents with   Diabetes Management Plan   Hypertension   77 y.o. female who presents for diabetes evaluation, education, and management. Patient arrives in good spirits and presents with assistance of a cane. Patient reports getting steroid shot in one knee and gel in the other recently. At-home BP readings range from 70/40 to 152/89 mm Hg.  Majority of home reading shared were 110-140 systolic/ 70-80 diastolic.    Patient was referred and last seen by Primary Care Provider, Dr. Jennette Kettle, on 09/19/22.   PMH is significant for hypertension, hyperlipidemia and diabetes.  At last visit, initiated the [facility] G7 today with use of a sample sensor.   Family/Social History: doing well.  Current diabetes medications include: Jardiance (empagliflozin) 25 mg once daily, Tresiba (insulin degludec) 25 units into the skin daily (patient takes differently 24 units), Insulin Lispro 10-18 units into the skin twice daily with 4 units added to lunch time dose, Glucophage-XR (metformin) 500 mg three times daily before meals (patient takes differently twice daily), Ozempic (semaglutide) 2 mg into the skin once a week (patient takes differently 1.5 mg once weekly - 76 clicks (2mg  dose) minus 19 clicks)  Current hypertension medications include: Norvasc (amlodipine) 5 mg once daily, Hyzaar (losartan-hydrochlorothiazide) 100-12.5 mg once daily, Toprol-XL (metoprolol succinate) 50 mg once daily, Aldactone (spironolactone) 25 mg once daily, Cardura (doxasozin) 2 mg once daily  Current hyperlipidemia medications include: Crestor (rosuvastatin) 20 mg once daily, Nexlizet (bempedoic acid-ezetimibe) 180-10 mg once daily  Patient reports adherence to taking all medications as prescribed.   Do you feel that your medications are working for you? yes Have you been experiencing any side effects to the medications prescribed? Yes - reports headaches a with low blood pressure readings a couple  times a week. Do you have any problems obtaining medications due to transportation or finances? no Insurance coverage: [employer]  Patient denies hypoglycemic events.  Patient reported dietary habits: Reports cutting out sweets and watching blood sugar more closely  Patient-reported exercise habits: Has increased physical activity after shots in knees  Patient lost 20.2 lbs since last visit. Patient reports goal weight is under 200 lbs.   O:   Review of Systems  Neurological:  Positive for headaches (reported with low BP readings.).  All other systems reviewed and are negative.   Physical Exam Constitutional:      Appearance: Normal appearance.  Neurological:     Mental Status: She is alert.  Psychiatric:        Mood and Affect: Mood normal.        Behavior: Behavior normal.        Thought Content: Thought content normal.        Judgment: Judgment normal.    7 day average blood glucose: 189 mg/dL  Dexcom G7 CGM Download today on 12/10/22 % Time CGM is active: 97.9% Average Glucose: 189 mg/dL Glucose Management Indicator: 7.8%  Glucose Variability: 30.7% (goal <36%) Time in Goal:  - Time in range 70-180: 52% - Time above range: 48% - Time below range: 0% Observed patterns:   Lab Results  Component Value Date   HGBA1C 8.0 (A) 09/19/2022   Vitals:   12/10/22 1037  BP: 124/71  Pulse: 67  SpO2: 97%    Lipid Panel     Component Value Date/Time   CHOL 148 12/12/2021 1222   TRIG 85 12/12/2021 1222   HDL 57 12/12/2021 1222   CHOLHDL  2.6 12/12/2021 1222   CHOLHDL 4.1 01/20/2015 1003   VLDL 10 01/20/2015 1003   LDLCALC 75 12/12/2021 1222   LDLDIRECT 96 03/23/2021 0931   LDLDIRECT 223 (H) 10/13/2015 0932    Clinical Atherosclerotic Cardiovascular Disease (ASCVD): No  The 10-year ASCVD risk score (Arnett DK, et al., 2019) is: 26.4%   Values used to calculate the score:     Age: 4 years     Sex: Female     Is Non-Hispanic African American:  Yes     Diabetic: Yes     Tobacco smoker: No     Systolic Blood Pressure: 124 mmHg     Is BP treated: Yes     HDL Cholesterol: 57 mg/dL     Total Cholesterol: 148 mg/dL   A/P: Diabetes longstanding and fairly well controlled. Assisted patient with downloading Clarity app for Dexcom G7 and connecting with our practice. Patient is able to verbalize appropriate hypoglycemia management plan. Medication adherence appears optimal. Weight loss of ~ 20 lbs since last visit.  - Prescribed Accu-Check Guide device, strips and lancets per patient's request to check blood sugar when removing old Dexcom sensor and placing new one or any symptomatic episode. - Continued Jardiance (empagliflozin) 25 mg once daily. - Continued Tresiba (insulin degludec) 25 units into the skin daily. - Continued Insulin Humalog (insulin lispro) 10-18 units into the skin twice daily with 4 units added to lunch time dose. - Continued Glucophage-XR (metformin) 500 mg three times daily before meals. - Continued Ozempic (semaglutide) 1.5 mg into the skin once a week. -Patient educated on purpose, proper use, and potential adverse effects. -Extensively discussed pathophysiology of diabetes, recommended lifestyle interventions, dietary effects on blood sugar control.  -Counseled on s/sx of and management of hypoglycemia.   ASCVD risk - primary prevention in patient with diabetes. Last LDL is 96 not at goal of <70 mg/dL.  - Continued Crestor (rosuvastatin) 20 mg once daily. - Continued Nexlizet (bempedoic acid-ezetimibe) 180-10 mg once daily. -Reassess after additional weight loss.   Hypertension longstanding and currently mostly at goal based on home readings and in office assessment. Blood pressure goal of <130 mmHg if tolerated. Medication adherence is optimal. - Continued Norvasc (amlodipine) 5 mg once daily. - Continued Hyzaar (losartan-hydrochlorothiazide) 100-12.5 mg once daily. - Continued Toprol-XL (metoprolol succinate)  50 mg once daily. - Continued Aldactone (spironolactone) 25 mg once daily. - Discontinued Cardura (doxasozin) 2 mg once daily. If blood pressure increases at next visit, consider restarting doxazosin at 1 mg daily.  History of GERD. Patient reports being prescribed omeprazole for exacerbation of cough/reflux symptoms. After discussion with Dr. Linwood Dibbles, refilled omeprazole 40 mg prn - no refill.  Written patient instructions provided. Patient verbalized understanding of treatment plan.  Total time in face to face counseling 34 minutes.    Follow-up:  Pharmacist PRN PCP clinic visit with Dr. Linwood Dibbles on 01/07/23. Patient seen with Rickey Primus, PharmD Candidate and Andee Poles, PharmD Candidate.

## 2022-12-11 NOTE — Progress Notes (Signed)
Reviewed and agree with Dr Koval's plan.   

## 2022-12-21 ENCOUNTER — Other Ambulatory Visit (HOSPITAL_COMMUNITY): Payer: Self-pay

## 2022-12-21 ENCOUNTER — Telehealth: Payer: Self-pay

## 2022-12-21 DIAGNOSIS — E1122 Type 2 diabetes mellitus with diabetic chronic kidney disease: Secondary | ICD-10-CM

## 2022-12-21 MED ORDER — INSULIN LISPRO (1 UNIT DIAL) 100 UNIT/ML (KWIKPEN)
14.0000 [IU] | PEN_INJECTOR | Freq: Two times a day (BID) | SUBCUTANEOUS | 0 refills | Status: DC
Start: 2022-12-21 — End: 2023-01-14

## 2022-12-21 NOTE — Telephone Encounter (Signed)
Pt calling in regarding her humalog/novolog.   Pt says she is almost out of Humalog and was looking for a shipment here at the office. Explained to patient I dont have any enrollment for this medication, only tresiba and ozempic. Her novo nordisk enrollment in 2023 included novolog but this was discontinued and not placed for 2024. I also dont see an active rx on her med list.  Pt says she has a week supply of Humalog at home that was borrowed from her sister. She would like an RX sent to [facility] on [address] (a month supply should run her $25). Informed her we could add novolog to her novo nordisk re-enrollment in October.

## 2022-12-21 NOTE — Telephone Encounter (Signed)
Patient contacted for follow-up of insulin supply need.   Patient requests new prescription for Humalog (to replace MAP Novolog supply which has not yet been reordered).  Current Medications include: Novolog 14-16 units prior to two meals daily.  Patient denies any significant medication related side effects.  Medication Plan: -Substitute Humalog kwikpen with voucher ($0) to allow patient to pick up 15ml (5 pens) for use.  - She verbalized understanding of the need to come to [facility] to obtain voucher and then proceed to pharmacy to process free one-time voucher.    Total time with patient call and documentation of interaction: 19 minutes.  F/U Phone call planned: None

## 2022-12-21 NOTE — Telephone Encounter (Signed)
Reviewed and agree with Dr Koval's plan.   

## 2023-01-02 ENCOUNTER — Other Ambulatory Visit: Payer: Self-pay | Admitting: Nephrology

## 2023-01-02 DIAGNOSIS — N1832 Chronic kidney disease, stage 3b: Secondary | ICD-10-CM

## 2023-01-02 DIAGNOSIS — E1122 Type 2 diabetes mellitus with diabetic chronic kidney disease: Secondary | ICD-10-CM

## 2023-01-07 ENCOUNTER — Ambulatory Visit: Payer: Medicare Other | Admitting: Family Medicine

## 2023-01-10 ENCOUNTER — Ambulatory Visit
Admission: RE | Admit: 2023-01-10 | Discharge: 2023-01-10 | Disposition: A | Discharge status: Home / Self Care | Payer: Medicare Other | Source: Ambulatory Visit | Attending: Nephrology | Admitting: Nephrology

## 2023-01-10 ENCOUNTER — Telehealth: Payer: Self-pay

## 2023-01-10 DIAGNOSIS — N183 Chronic kidney disease, stage 3 unspecified: Secondary | ICD-10-CM

## 2023-01-10 DIAGNOSIS — N1832 Chronic kidney disease, stage 3b: Secondary | ICD-10-CM

## 2023-01-10 NOTE — Telephone Encounter (Signed)
Informed patient her novo nordisk shipment is ready for pickup  4 boxes of ozempic 2mg  dose pens are labeled and ready in med room fridge

## 2023-01-14 ENCOUNTER — Ambulatory Visit: Payer: Medicare Other | Admitting: Family Medicine

## 2023-01-14 ENCOUNTER — Encounter: Payer: Self-pay | Admitting: Family Medicine

## 2023-01-14 VITALS — BP 133/77 | HR 66 | Ht 65.0 in | Wt 252.4 lb

## 2023-01-14 DIAGNOSIS — E1122 Type 2 diabetes mellitus with diabetic chronic kidney disease: Secondary | ICD-10-CM

## 2023-01-14 DIAGNOSIS — E11649 Type 2 diabetes mellitus with hypoglycemia without coma: Secondary | ICD-10-CM | POA: Diagnosis not present

## 2023-01-14 DIAGNOSIS — K219 Gastro-esophageal reflux disease without esophagitis: Secondary | ICD-10-CM

## 2023-01-14 DIAGNOSIS — E11319 Type 2 diabetes mellitus with unspecified diabetic retinopathy without macular edema: Secondary | ICD-10-CM

## 2023-01-14 DIAGNOSIS — Z794 Long term (current) use of insulin: Secondary | ICD-10-CM

## 2023-01-14 DIAGNOSIS — G4733 Obstructive sleep apnea (adult) (pediatric): Secondary | ICD-10-CM | POA: Diagnosis not present

## 2023-01-14 DIAGNOSIS — E1140 Type 2 diabetes mellitus with diabetic neuropathy, unspecified: Secondary | ICD-10-CM

## 2023-01-14 DIAGNOSIS — H409 Unspecified glaucoma: Secondary | ICD-10-CM

## 2023-01-14 DIAGNOSIS — N183 Chronic kidney disease, stage 3 unspecified: Secondary | ICD-10-CM

## 2023-01-14 DIAGNOSIS — D869 Sarcoidosis, unspecified: Secondary | ICD-10-CM

## 2023-01-14 DIAGNOSIS — E7800 Pure hypercholesterolemia, unspecified: Secondary | ICD-10-CM

## 2023-01-14 DIAGNOSIS — N1832 Chronic kidney disease, stage 3b: Secondary | ICD-10-CM | POA: Diagnosis not present

## 2023-01-14 DIAGNOSIS — I10 Essential (primary) hypertension: Secondary | ICD-10-CM

## 2023-01-14 LAB — POCT GLYCOSYLATED HEMOGLOBIN (HGB A1C): HbA1c, POC (controlled diabetic range): 8.1 % — AB (ref 0.0–7.0)

## 2023-01-14 MED ORDER — BD PEN NEEDLE SHORT U/F 31G X 8 MM MISC: 3 refills | Status: DC

## 2023-01-14 MED ORDER — OMEPRAZOLE 40 MG PO CPDR: 40.0000 mg | DELAYED_RELEASE_CAPSULE | Freq: Every day | ORAL | 3 refills | Status: DC | PRN

## 2023-01-14 MED ORDER — INSULIN LISPRO (1 UNIT DIAL) 100 UNIT/ML (KWIKPEN)
12.0000 [IU] | PEN_INJECTOR | Freq: Two times a day (BID) | SUBCUTANEOUS | Status: DC
Start: 2023-01-14 — End: 2023-02-26

## 2023-01-14 NOTE — Patient Instructions (Signed)
It was great to see you!  Our plans for today:  - We are checking some labs today, we will release these results to your MyChart. - We sent refills to your pharmacy.  - Consider getting your shingles shot and RSV vaccine at your pharmacy.  Take care and seek immediate care sooner if you develop any concerns.   Dr. Linwood Dibbles

## 2023-01-14 NOTE — Progress Notes (Unsigned)
SUBJECTIVE:   CHIEF COMPLAINT / HPI:   Hypertension: - Medications: losartan-hydrochlorothiazide, spironolactone, norvasc, metoprolol. Previously on doxazosin.  - Compliance: good - Checking BP at home: yes, 120-130s SBP - Denies any SOB, CP, vision changes, LE edema, medication SEs, or symptoms of hypotension  OSA - on CPAP. Not using, felt like she was choking with it. Has f/u with Pulm soon.  Diabetes, Type 2 - Last A1c 8.0 08/2022 - Medications: metformin, humalog, ozempic, jardiance, tresiba 24u (12u in am, 12u in pm) - Compliance: 1000mg  metformin, ~12 BID before meals.  - Checking BG at home: yes, has dexcom. ~73% in expected range, expected A1c 7.5%. - Eye exam: UTD - Foot exam: UTD - Microalbumin: UTD - Statin: yes - on cymbalta for neuropathy  Sarcoid - follows with pulm - using albuterol occasionally.  - previously on symbicort, was taken off as didn't need.  HLD - medications: crestor - compliance: good - medication SEs: none  GERD - on prilosec, initially started for chronic cough, notices when doesn't take.   CKD3 - Now follows with nephro, f/u q6 mth. Renal US 9/19, normal.   Glaucoma - follows with Optho. Compliant with timolol drops.   Has hearing test scheduled in 01/2023.   OBJECTIVE:   BP 133/77   Pulse 66   Ht 5\' 5"  (1.651 m)   Wt 252 lb 6.4 oz (114.5 kg)   SpO2 98%   BMI 42.00 kg/m   Gen: well appearing, in NAD Card: Reg rate Lungs: Comfortable WOB on RA Ext: WWP, no edema   ASSESSMENT/PLAN:   Problem List Items Addressed This Visit       Cardiovascular and Mediastinum   Essential hypertension    Doing well on current regimen, no changes made today.        Respiratory   Obstructive sleep apnea    Encouraged compliance with CPAP. Encouraged to reach out to respiratory therapy to try different mask to see if helps.        Digestive   GERD (gastroesophageal reflux disease)    Improved with PPI, continue       Relevant Medications   omeprazole (PRILOSEC) 40 MG capsule     Endocrine   DM (diabetes mellitus), type 2 with renal complications (HCC) - Primary    A1c at goal today, no changes. Changed metformin dosing to reflect renal dosing. Will see about getting recent eye exam records.       Relevant Medications   insulin lispro (HUMALOG KWIKPEN) 100 UNIT/ML KwikPen   Insulin Pen Needle (B-D ULTRAFINE III SHORT PEN) 31G X 8 MM MISC   Other Relevant Orders   HgB A1c (Completed)   Diabetic neuropathy, type II diabetes mellitus (HCC)   Relevant Medications   insulin lispro (HUMALOG KWIKPEN) 100 UNIT/ML KwikPen   Diabetic retinopathy (HCC)   Relevant Medications   insulin lispro (HUMALOG KWIKPEN) 100 UNIT/ML KwikPen     Genitourinary   CKD (chronic kidney disease) stage 3, GFR 30-59 ml/min (HCC)    Reviewed recent labs with nephro. Continue renally dosed meds. On ARB, spiro, jardiance.         Other   SARCOIDOSIS, PULMONARY   Hyperlipidemia    Tolerant of statin, continue      GLAUCOMA NOS   Other Visit Diagnoses     Uncontrolled type 2 diabetes mellitus with hypoglycemia without coma (HCC)       Relevant Medications   insulin lispro (HUMALOG KWIKPEN) 100 UNIT/ML KwikPen  Insulin Pen Needle (B-D ULTRAFINE III SHORT PEN) 31G X 8 MM MISC           Caro Laroche, DO

## 2023-01-15 NOTE — Assessment & Plan Note (Signed)
Tolerant of statin, continue. 

## 2023-01-15 NOTE — Assessment & Plan Note (Signed)
Encouraged compliance with CPAP. Encouraged to reach out to respiratory therapy to try different mask to see if helps.

## 2023-01-15 NOTE — Assessment & Plan Note (Signed)
Doing well on current regimen, no changes made today.

## 2023-01-15 NOTE — Assessment & Plan Note (Addendum)
A1c at goal today, no changes. Changed metformin dosing to reflect renal dosing. Will see about getting recent eye exam records.

## 2023-01-15 NOTE — Assessment & Plan Note (Signed)
Reviewed recent labs with nephro. Continue renally dosed meds. On ARB, spiro, jardiance.

## 2023-01-15 NOTE — Assessment & Plan Note (Signed)
Improved with PPI, continue

## 2023-01-25 ENCOUNTER — Encounter: Payer: Self-pay | Admitting: Emergency Medicine

## 2023-01-25 ENCOUNTER — Ambulatory Visit: Payer: Medicare Other | Admitting: Emergency Medicine

## 2023-01-25 VITALS — BP 130/62 | HR 92 | Ht 65.0 in | Wt 254.0 lb

## 2023-01-25 DIAGNOSIS — G4733 Obstructive sleep apnea (adult) (pediatric): Secondary | ICD-10-CM | POA: Diagnosis not present

## 2023-01-25 DIAGNOSIS — K219 Gastro-esophageal reflux disease without esophagitis: Secondary | ICD-10-CM

## 2023-01-25 DIAGNOSIS — D869 Sarcoidosis, unspecified: Secondary | ICD-10-CM

## 2023-01-25 DIAGNOSIS — J310 Chronic rhinitis: Secondary | ICD-10-CM | POA: Diagnosis not present

## 2023-01-25 NOTE — Assessment & Plan Note (Signed)
GERD Reports improvement in cough with consistent use of omeprazole. Recent interruption in medication due to insurance issues, but now resolved. -Continue omeprazole. Monitor for return of cough.

## 2023-01-25 NOTE — Assessment & Plan Note (Signed)
Sarcoidosis Stable since last visit in March 2024. No new symptoms or changes in breathing. Rash noted on arms and back, but not in typical locations (knees, around eyes) and no associated respiratory symptoms. -Plan for repeat pulmonary function testing and CT chest in March 2026, unless symptoms change.  General Health Maintenance -Encourage patient to receive flu shot in late October/November as planned. -Recommend RSV vaccine due to underlying lung disease.

## 2023-01-25 NOTE — Assessment & Plan Note (Signed)
Chronic Rhinitis Reports congestion primarily in head, using albuterol as needed with relief. -Advise to use allergy medications (Zyrtec and Flonase) consistently for 10 days or throughout fall allergy season for prevention.

## 2023-01-25 NOTE — Assessment & Plan Note (Signed)
Obstructive Sleep Apnea Non-compliant with CPAP due to discomfort and feelings of choking. -Refer to sleep specialist for consultation on alternatives to CPAP, including Inspire device or dental device.

## 2023-01-25 NOTE — Patient Instructions (Signed)
VISIT SUMMARY:  During your visit, we discussed your ongoing conditions including sarcoidosis, obstructive sleep apnea, chronic rhinitis, and GERD (acid reflux). We also talked about your recent rash and your difficulty in using your CPAP machine for sleep apnea.  YOUR PLAN:  -SARCOIDOSIS: Your sarcoidosis, a condition that causes inflammation in your body's organs, has been stable with no new symptoms or changes in your breathing. We will repeat your lung function tests and a chest CT scan in March 2026, unless your symptoms change.  -OBSTRUCTIVE SLEEP APNEA: You've been having trouble using your CPAP machine, which is meant to help with your sleep apnea, a condition where your breathing stops and starts while you sleep. We will refer you to a sleep specialist to discuss alternatives to the CPAP machine, such as the Inspire device or a dental device.  -CHRONIC RHINITIS: You've been experiencing congestion in your head, which you've been managing with albuterol. We recommend that you use your allergy medications, Zyrtec and Flonase, consistently for 10 days or throughout the fall allergy season to prevent symptoms.  -GERD: Your GERD, a condition where stomach acid frequently flows back into your esophagus, has improved with the consistent use of omeprazole. Continue taking this medication and monitor for the return of your cough.  INSTRUCTIONS:  For your general health, we encourage you to get your flu shot in late October or November as planned. We also recommend that you get the RSV vaccine due to your underlying lung disease. If you have any changes in your symptoms or health, please contact us immediately.

## 2023-01-25 NOTE — Progress Notes (Signed)
HPI:   ROV 07/17/2022 --follow-up visit for history of sarcoidosis with associated obstructive lung disease.  She also has obstructive sleep apnea, chronic cough in the setting of GERD and chronic rhinitis.  She is on CPAP.  She was treated for an acute bronchitis in January following a viral infection, possibly the flu.  We had tried stopping her Symbicort. Was treated w prednisone, restarted symbicort temporarily, nasal sprays. She felt better, less congestion but cough has never completely gone away.  Today she reports some increase in her cough, especially bothersome at night, producing white mucous, making it tough to wear her CPAP.  Currently on omeprazole 40 mg daily, albuterol about once a day.   CT chest 06/23/2022 reviewed by me, shows no hilar or mediastinal adenopathy, no infiltrates, no scarring that would be consistent with sarcoidosis.  Pulmonary function testing 06/25/2022 reviewed by me shows normal airflows with out of bronchodilator response, normal lung volumes, normal diffusion capacity.  Her FEV1 on is 2.24 L (103% predicted), down slightly from 2.65 L 2 years ago  ROV 01/25/23 --  The patient is a 77 year old individual with a history of sarcoidosis and associated obstructive lung disease, obstructive sleep apnea, chronic cough in the setting of GERD, and chronic rhinitis. She was last seen in March 2024, at which time her sarcoidosis was stable. The patient was not using Symbicort and was using albuterol as needed. She was also on omeprazole, fluticasone nasal spray, ipratropium nasal spray, and Zyrtec. However, she reported not being able to wear her CPAP machine.  The patient reported occasional use of albuterol, about twice a week, due to feeling congested, primarily in the head, after going outside. She also reported using Zyrtec and fluticasone nasal spray as needed, which she found helpful. She was on omeprazole for her GERD and had doubled the dose for a couple of weeks due to a  cough, which she found helpful. However, she had difficulty getting her insurance to approve the prescription, leading to a temporary discontinuation of the medication. Since restarting the omeprazole, she has not had much cough, but has experienced hoarseness and a feeling of stuffiness.  The patient also reported a rash on her arms and occasionally on her back, but did not know if it was connected to her sarcoidosis as she did not feel anything different. She had not seen the rash on her knees or around her eyes, where she had seen it before. She had not noticed any changes in her breathing. She had not yet received her flu shot but planned to get it later in the fall. She was also considering getting the RSV vaccine.   Exam:  Vitals:   01/25/23 1343  BP: 130/62  Pulse: 92  SpO2: 96%  Weight: 254 lb (115.2 kg)  Height: 5\' 5"  (1.651 m)     Gen: Pleasant, obese, in no distress,  normal affect, obese   ENT: No lesions,  mouth clear,  oropharynx clear, no postnasal drip  Neck: No JVD, no Stridor  Lungs: No use of accessory muscles, somewhat distant, no crackles or wheezing   Cardiovascular: RRR, heart sounds normal, no murmur or gallops, no peripheral edema  Musculoskeletal: No deformities, no cyanosis or clubbing  Neuro: alert, non focal  Skin: Some old hyperpigmentation but no evidence of active sarcoid rash    SARCOIDOSIS, PULMONARY Sarcoidosis Stable since last visit in March 2024. No new symptoms or changes in breathing. Rash noted on arms and back, but not in typical locations (  knees, around eyes) and no associated respiratory symptoms. -Plan for repeat pulmonary function testing and CT chest in March 2026, unless symptoms change.  General Health Maintenance -Encourage patient to receive flu shot in late October/November as planned. -Recommend RSV vaccine due to underlying lung disease.  Obstructive sleep apnea Obstructive Sleep Apnea Non-compliant with CPAP due to  discomfort and feelings of choking. -Refer to sleep specialist for consultation on alternatives to CPAP, including Inspire device or dental device.  Chronic rhinitis Chronic Rhinitis Reports congestion primarily in head, using albuterol as needed with relief. -Advise to use allergy medications (Zyrtec and Flonase) consistently for 10 days or throughout fall allergy season for prevention.   GERD (gastroesophageal reflux disease) GERD Reports improvement in cough with consistent use of omeprazole. Recent interruption in medication due to insurance issues, but now resolved. -Continue omeprazole. Monitor for return of cough.      Levy Pupa, MD, PhD 01/25/2023, 2:01 PM [facility] [phone] or if no answer [phone]

## 2023-01-28 ENCOUNTER — Telehealth: Payer: Self-pay

## 2023-01-28 NOTE — Telephone Encounter (Signed)
Patient contacted for follow-up of "need for Dexcom support".   Since last contact patient reports that she has run into a problem with insurance coverage for her DEXCOM G7.  She has been working through [employer] for her support/acquisition.    We discussed that her Med Assistance for insulin was possibly the problem.  She shared that she did have a claim for insulin processed within the last 1-2 months.   She also reported having a 1 month supply available to her currently through [employer].   She does NOT need further help at this time.    Total time with patient call and documentation of interaction: 12 minutes.

## 2023-01-28 NOTE — Telephone Encounter (Signed)
Reviewed and agree with Dr Koval's plan.   

## 2023-01-28 NOTE — Telephone Encounter (Signed)
Patient calls nurse line regarding PA needed for Dexcom 7 sensors.  She reports that the insurance needs a letter regarding medical necessity.   Will forward to [place] and pharmacy team for further assistance.   Veronda Prude, RN

## 2023-02-04 ENCOUNTER — Ambulatory Visit: Payer: Medicare Other | Admitting: Audiology

## 2023-02-14 ENCOUNTER — Other Ambulatory Visit: Payer: Self-pay | Admitting: Cardiovascular Disease

## 2023-02-25 ENCOUNTER — Other Ambulatory Visit: Payer: Self-pay | Admitting: Family Medicine

## 2023-02-25 DIAGNOSIS — E1122 Type 2 diabetes mellitus with diabetic chronic kidney disease: Secondary | ICD-10-CM

## 2023-03-11 ENCOUNTER — Ambulatory Visit: Payer: Medicare Other | Attending: Family Medicine | Admitting: Audiology

## 2023-03-11 DIAGNOSIS — H9042 Sensorineural hearing loss, unilateral, left ear, with unrestricted hearing on the contralateral side: Secondary | ICD-10-CM | POA: Insufficient documentation

## 2023-03-11 NOTE — Procedures (Signed)
  Outpatient Audiology and [facility] [address] [place], [place]  [address] [phone]  AUDIOLOGICAL  EVALUATION  NAME: Rebecca Hicks     DOB:   1945/08/03      MRN: [id number]                                                                                     DATE: 03/11/2023     REFERENT: Caro Laroche, DO STATUS: Outpatient DIAGNOSIS: Sensorineural hearing loss, left ear   History: Rebecca Hicks was seen for an audiological evaluation due to her ears feeling "stopped up" all the time. Rebecca Hicks reports her family has concerns regarding her hearing sensitivity. Rebecca Hicks denies otalgia, and tinnitus. She reports a history of aural fullness. Rebecca Hicks has a history of vertigo with no recent episodes of vertigo. Rebecca Hicks reports she listens to the television very loudly.   Evaluation:  Otoscopy showed non-occluding cerumen, bilaterally Tympanometry results were consistent with normal middle ear function (Type A), bilaterally Audiometric testing was completed using Conventional Audiometry techniques with insert earphones and TDH headphones. Test results are consistent in the right ear with normal hearing sensitivity at [phone] and results are consistent in the left ear with normal hearing sensitivity with the exception of a mild hearing loss at 3000 Hz and a moderately-severe hearing loss at 6000-8000 Hz. An asymmetry is noted at 6000-8000 Hz worse in the left ear.  Speech Recognition Thresholds were obtained at 20 dB HL in the right ear and at 15  dB HL in the left ear. Word Recognition Testing was completed at 60 dB HL and Rebecca Hicks scored 92%, bilaterally.    Results:  The test results were reviewed with Rebecca Hicks. Today's results test are consistent in the right ear with normal hearing sensitivity at [phone] and results are consistent in the left ear with normal hearing sensitivity with the exception of a mild hearing loss at 3000 Hz and a moderately-severe hearing loss  at 6000-8000 Hz. An asymmetry is noted at 6000-8000 Hz worse in the left ear. Rebecca Hicks may have hearing and communication difficulty in adverse listening environments. She will benefit from the use of good communication strategies. Hearing aids are not recommended at this time. A referral to an ENT was reviewed due to asymmetry.   Recommendations: 1.   Use of Debrox Drops for ear wax management  2.   Referral to an Ear, Nose, and Throat Physician due to asymmetric hearing loss.  3.   Monitor hearing sensitivity. Return in 2-3 years for an audiological evaluation.     30 minutes spent testing and counseling on results.   If you have any questions please feel free to contact me at [phone].  Marton Redwood Audiologist, Au.D., CCC-A 03/11/2023  2:58 PM  Cc: Caro Laroche, DO

## 2023-03-18 ENCOUNTER — Ambulatory Visit: Payer: Medicare Other | Admitting: Family Medicine

## 2023-04-01 ENCOUNTER — Telehealth: Payer: Self-pay

## 2023-04-01 NOTE — Progress Notes (Signed)
Pharmacy Medication Assistance Program Note    04/11/2023  Patient ID: Rebecca Hicks, female   DOB: 1945/06/27, 77 y.o.   MRN: [id number]     04/01/2023  Outreach Medication One  Manufacturer Medication One [employer] [facility] Ozempic  Dose of Ozempic 2MG   Type of Radiographer, therapeutic Assistance  Name of Prescriber Ellwood Dense  Date Application Submitted to Manufacturer 04/01/2023  Method Application Sent to Manufacturer Online  Patient Assistance Determination Approved  Approval Start Date 04/10/2023  Approval End Date 04/22/2024         04/01/2023  Outreach Medication Two  Manufacturer Medication Two [employer] Nordisk  Nordisk Drugs Novolog  Dose of Novolog U100  Type of Radiographer, therapeutic Assistance  Name of Prescriber ALISON RUMBALL  Method Application Sent to [employer] Online  Date Application Submitted to Manufacturer 04/01/2023  Patient Assistance Determination Approved  Approval Start Date 04/10/2023         04/01/2023  Outreach Medication Three  Manufacturer Medication Three [employer] Nordisk  Nordisk Drugs Tresiba  Dose of Tresiba U100  Type of Radiographer, therapeutic Assistance  Date Application Submitted to Manufacturer 04/01/2023  Method Application Sent to Manufacturer Online  Patient Assistance Determination Approved  Approval Start Date 04/10/2023  Approval End Date 04/22/2024    Enrolled until 04/22/24

## 2023-04-02 NOTE — Telephone Encounter (Deleted)
Placed in Dr. Quentin Cornwall box for signature.

## 2023-04-09 NOTE — Telephone Encounter (Signed)
Faxed completed provider pages to company.

## 2023-04-10 ENCOUNTER — Ambulatory Visit: Payer: Medicare Other

## 2023-04-12 ENCOUNTER — Encounter: Payer: Self-pay | Admitting: Family Medicine

## 2023-04-12 DIAGNOSIS — I10 Essential (primary) hypertension: Secondary | ICD-10-CM

## 2023-04-15 MED ORDER — SPIRONOLACTONE 25 MG PO TABS: 25.0000 mg | ORAL_TABLET | Freq: Every day | ORAL | 3 refills | Status: DC

## 2023-04-29 ENCOUNTER — Ambulatory Visit: Payer: Medicare Other

## 2023-04-30 DIAGNOSIS — H18423 Band keratopathy, bilateral: Secondary | ICD-10-CM | POA: Diagnosis not present

## 2023-05-02 ENCOUNTER — Encounter: Payer: Self-pay | Admitting: Family Medicine

## 2023-05-03 DIAGNOSIS — E113313 Type 2 diabetes mellitus with moderate nonproliferative diabetic retinopathy with macular edema, bilateral: Secondary | ICD-10-CM | POA: Diagnosis not present

## 2023-05-03 DIAGNOSIS — Z961 Presence of intraocular lens: Secondary | ICD-10-CM | POA: Diagnosis not present

## 2023-05-03 DIAGNOSIS — H18423 Band keratopathy, bilateral: Secondary | ICD-10-CM | POA: Diagnosis not present

## 2023-05-03 DIAGNOSIS — H35342 Macular cyst, hole, or pseudohole, left eye: Secondary | ICD-10-CM | POA: Diagnosis not present

## 2023-05-13 ENCOUNTER — Other Ambulatory Visit: Payer: Self-pay | Admitting: Cardiovascular Disease

## 2023-05-13 DIAGNOSIS — E785 Hyperlipidemia, unspecified: Secondary | ICD-10-CM

## 2023-05-13 MED ORDER — NEXLIZET 180-10 MG PO TABS: 1.0000 | ORAL_TABLET | Freq: Every day | ORAL | 2 refills | Status: AC

## 2023-05-14 DIAGNOSIS — H35342 Macular cyst, hole, or pseudohole, left eye: Secondary | ICD-10-CM | POA: Diagnosis not present

## 2023-05-14 DIAGNOSIS — E113313 Type 2 diabetes mellitus with moderate nonproliferative diabetic retinopathy with macular edema, bilateral: Secondary | ICD-10-CM | POA: Diagnosis not present

## 2023-05-17 DIAGNOSIS — H35342 Macular cyst, hole, or pseudohole, left eye: Secondary | ICD-10-CM | POA: Diagnosis not present

## 2023-05-17 DIAGNOSIS — E113313 Type 2 diabetes mellitus with moderate nonproliferative diabetic retinopathy with macular edema, bilateral: Secondary | ICD-10-CM | POA: Diagnosis not present

## 2023-05-17 DIAGNOSIS — Z961 Presence of intraocular lens: Secondary | ICD-10-CM | POA: Diagnosis not present

## 2023-05-17 DIAGNOSIS — H18422 Band keratopathy, left eye: Secondary | ICD-10-CM | POA: Diagnosis not present

## 2023-05-21 ENCOUNTER — Encounter: Payer: Self-pay | Admitting: Nurse Practitioner

## 2023-05-21 ENCOUNTER — Institutional Professional Consult (permissible substitution): Payer: Medicare Other | Admitting: Nurse Practitioner

## 2023-05-28 DIAGNOSIS — M25572 Pain in left ankle and joints of left foot: Secondary | ICD-10-CM | POA: Diagnosis not present

## 2023-05-29 DIAGNOSIS — M5416 Radiculopathy, lumbar region: Secondary | ICD-10-CM | POA: Diagnosis not present

## 2023-06-14 DIAGNOSIS — H35342 Macular cyst, hole, or pseudohole, left eye: Secondary | ICD-10-CM | POA: Diagnosis not present

## 2023-06-14 DIAGNOSIS — E113313 Type 2 diabetes mellitus with moderate nonproliferative diabetic retinopathy with macular edema, bilateral: Secondary | ICD-10-CM | POA: Diagnosis not present

## 2023-06-15 ENCOUNTER — Other Ambulatory Visit: Payer: Self-pay | Admitting: Cardiovascular Disease

## 2023-06-17 ENCOUNTER — Encounter: Payer: Self-pay | Admitting: Family Medicine

## 2023-06-18 ENCOUNTER — Telehealth: Payer: Self-pay

## 2023-06-18 DIAGNOSIS — E1165 Type 2 diabetes mellitus with hyperglycemia: Secondary | ICD-10-CM

## 2023-06-18 DIAGNOSIS — E11649 Type 2 diabetes mellitus with hypoglycemia without coma: Secondary | ICD-10-CM

## 2023-06-18 NOTE — Telephone Encounter (Signed)
 Rec'd call from patient, following up on her novo nordisk shipments.   Called novo nordisk to f/u on shipment status. Per rep, meds have not shipped due to delays. 30 day vouchers were supplied for the following medications to be filled at the pharmacy.   TRESIBA U100 FLEXPEN: BIN [id number] [facility] [employer] [id number] ID [id number]  NOVOLOG FLEXPENS:  BIN [id number] [facility] [employer] [id number] ID [id number]  OZEMPIC 2MG : BIN [id number] [facility]N [employer] [id number] ID [id number]   Please send ONLY Evaristo Bury and Novolog to [facility] on [address]- with a note to pharmacy "do not [employer], voucher being called in". Patient says she has enough Ozempic for now. Let me know once sent, I will call in the card info. Thanks!

## 2023-06-19 MED ORDER — NOVOLOG FLEXPEN 100 UNIT/ML ~~LOC~~ SOPN: 12.0000 [IU] | PEN_INJECTOR | Freq: Two times a day (BID) | SUBCUTANEOUS | 11 refills | Status: AC

## 2023-06-19 MED ORDER — TRESIBA FLEXTOUCH 100 UNIT/ML ~~LOC~~ SOPN: 25.0000 [IU] | PEN_INJECTOR | Freq: Every day | SUBCUTANEOUS | 0 refills | Status: AC

## 2023-06-19 MED ORDER — TRESIBA FLEXTOUCH 100 UNIT/ML ~~LOC~~ SOPN
25.0000 [IU] | PEN_INJECTOR | Freq: Every day | SUBCUTANEOUS | Status: DC
Start: 2023-06-19 — End: 2023-06-19

## 2023-06-19 NOTE — Telephone Encounter (Signed)
 Rx sent to [facility] for Novolog, Tresiba.

## 2023-06-19 NOTE — Telephone Encounter (Signed)
 Rx sent

## 2023-06-19 NOTE — Telephone Encounter (Signed)
 Spoke with pharmacy and provided voucher card info for both meds.   Pharmacy will apply the [place] voucher as soon as med is resent (with same note to pharmacy to NOT bill to insurance).

## 2023-06-25 ENCOUNTER — Ambulatory Visit: Admitting: Pharmacist

## 2023-06-26 DIAGNOSIS — Z961 Presence of intraocular lens: Secondary | ICD-10-CM | POA: Diagnosis not present

## 2023-06-26 DIAGNOSIS — H18422 Band keratopathy, left eye: Secondary | ICD-10-CM | POA: Diagnosis not present

## 2023-06-26 DIAGNOSIS — H35342 Macular cyst, hole, or pseudohole, left eye: Secondary | ICD-10-CM | POA: Diagnosis not present

## 2023-06-26 DIAGNOSIS — E113313 Type 2 diabetes mellitus with moderate nonproliferative diabetic retinopathy with macular edema, bilateral: Secondary | ICD-10-CM | POA: Diagnosis not present

## 2023-06-27 ENCOUNTER — Telehealth: Payer: Self-pay

## 2023-06-27 NOTE — Telephone Encounter (Signed)
 Patient calls nurse line requesting that I send message to Dr. Raymondo Band regarding Dexcom 7 sensors.   She states that she has an appointment next week with him, however, will need sensor replaced prior to this appointment.   She is asking if she can receive a Dexcom 7 sensor.   Will forward request to Dr. Raymondo Band.   Veronda Prude, RN

## 2023-06-28 NOTE — Telephone Encounter (Signed)
 Patient contacted for follow-up of CGM sample request.   Since last contact patient reports that her glucose readings have been elevated due to a steroid injection in her back ~ 1 month ago.  States her back is feeling better currently.   After review of her CGM data for the last two weeks, it appears that the steroid injection effect is diminished and patient is close to baseline glucose readings.   I shared that I currently do not have any samples of Dexcom G7 but will likely have samples on Tuesday of next week.  I offered her to pick one up later in the day on 3/11.   She may also be receiving replacement sensors for 2 recently failed sensors.  No change in Diabetes Tx at this time.   Total time with patient call and documentation of interaction: 12 minutes.  F/U - Pharmacist Clinic  next week.

## 2023-07-01 DIAGNOSIS — M48061 Spinal stenosis, lumbar region without neurogenic claudication: Secondary | ICD-10-CM | POA: Diagnosis not present

## 2023-07-01 NOTE — Telephone Encounter (Signed)
 Reviewed and agree with Dr Macky Lower plan.

## 2023-07-03 DIAGNOSIS — M19072 Primary osteoarthritis, left ankle and foot: Secondary | ICD-10-CM | POA: Diagnosis not present

## 2023-07-03 DIAGNOSIS — M19071 Primary osteoarthritis, right ankle and foot: Secondary | ICD-10-CM | POA: Diagnosis not present

## 2023-07-03 DIAGNOSIS — S92405A Nondisplaced unspecified fracture of left great toe, initial encounter for closed fracture: Secondary | ICD-10-CM | POA: Diagnosis not present

## 2023-07-04 ENCOUNTER — Ambulatory Visit (INDEPENDENT_AMBULATORY_CARE_PROVIDER_SITE_OTHER): Admitting: Pharmacist

## 2023-07-04 ENCOUNTER — Encounter: Payer: Self-pay | Admitting: Pharmacist

## 2023-07-04 VITALS — BP 155/90 | HR 77 | Wt 258.0 lb

## 2023-07-04 DIAGNOSIS — N183 Chronic kidney disease, stage 3 unspecified: Secondary | ICD-10-CM | POA: Diagnosis not present

## 2023-07-04 DIAGNOSIS — I10 Essential (primary) hypertension: Secondary | ICD-10-CM

## 2023-07-04 DIAGNOSIS — E11649 Type 2 diabetes mellitus with hypoglycemia without coma: Secondary | ICD-10-CM | POA: Diagnosis not present

## 2023-07-04 DIAGNOSIS — Z794 Long term (current) use of insulin: Secondary | ICD-10-CM | POA: Diagnosis not present

## 2023-07-04 DIAGNOSIS — N1832 Chronic kidney disease, stage 3b: Secondary | ICD-10-CM | POA: Diagnosis not present

## 2023-07-04 DIAGNOSIS — E1140 Type 2 diabetes mellitus with diabetic neuropathy, unspecified: Secondary | ICD-10-CM | POA: Diagnosis not present

## 2023-07-04 DIAGNOSIS — E7800 Pure hypercholesterolemia, unspecified: Secondary | ICD-10-CM

## 2023-07-04 DIAGNOSIS — E1122 Type 2 diabetes mellitus with diabetic chronic kidney disease: Secondary | ICD-10-CM | POA: Diagnosis not present

## 2023-07-04 LAB — POCT GLYCOSYLATED HEMOGLOBIN (HGB A1C): HbA1c, POC (controlled diabetic range): 8.9 % — AB (ref 0.0–7.0)

## 2023-07-04 MED ORDER — METOPROLOL SUCCINATE ER 50 MG PO TB24: 50.0000 mg | ORAL_TABLET | Freq: Every day | ORAL | 3 refills | Status: DC

## 2023-07-04 MED ORDER — METFORMIN HCL ER 500 MG PO TB24: 1000.0000 mg | ORAL_TABLET | Freq: Every day | ORAL | 3 refills | Status: AC

## 2023-07-04 MED ORDER — EMPAGLIFLOZIN 25 MG PO TABS: 25.0000 mg | ORAL_TABLET | Freq: Every day | ORAL | 3 refills | Status: DC

## 2023-07-04 MED ORDER — LOSARTAN POTASSIUM-HCTZ 100-12.5 MG PO TABS: 1.0000 | ORAL_TABLET | Freq: Every day | ORAL | 3 refills | Status: AC

## 2023-07-04 NOTE — Patient Instructions (Signed)
 It was nice to see you today!  Your goal blood sugar is 80-130 before eating and less than 180 after eating.  Medication Changes:   Continue all other medication the same.   Monitor blood sugars at home and keep a log (glucometer or piece of paper) to bring with you to your next visit.  Keep up the good work with diet and exercise. Aim for a diet full of vegetables, fruit and lean meats (chicken, [place], fish). Try to limit salt intake by eating fresh or frozen vegetables (instead of canned), rinse canned vegetables prior to cooking and do not add any additional salt to meals.

## 2023-07-04 NOTE — Progress Notes (Unsigned)
 S:     Chief Complaint  Patient presents with   Medication Management    Diabetes   78 y.o. female who presents for diabetes evaluation, education, and management. Patient arrives in good spirits and presents without any assistance.   Patient was referred and last seen by Primary Care Provider, Dr. Linwood Dibbles, on 12/25/22.   PMH is significant for ***.  At last visit, ***.   Patient reports Diabetes was diagnosed in ***.   Family/Social History: ***  Current diabetes medications include: *** Current hypertension medications include: *** Current hyperlipidemia medications include: ***  Patient reports adherence to taking all medications as prescribed.  *** Patient denies adherence with medications, reports missing *** medications *** times per week, on average.  Do you feel that your medications are working for you? {YES NO:22349} Have you been experiencing any side effects to the medications prescribed? {YES NO:22349} Do you have any problems obtaining medications due to transportation or finances? {YES [id number] Insurance coverage: ***  Patient denies hypoglycemic events  Patient reported dietary habits: Eats *** meals/day Breakfast: *** Lunch: *** Dinner: *** Snacks: *** Drinks: ***  Within the past 12 months, did you worry whether your food would run out before you got money to buy more? {YES NO:22349} Within the past 12 months, did the food you bought run out, and you didn't have money to get more? {YES NO:22349} PHQ-9 Score: ***  Patient-reported exercise habits: patient reports spinal stenosis prevents mobility. First PT appointment on Monday (3/17)   O:   ROS  Physical Exam  7 day average blood glucose: ***  Libre3 CGM Download today 07/04/23 % Time CGM is active: 74.8% Average Glucose: 193 mg/dL Glucose Management Indicator: 7.9  Glucose Variability: 25% (goal <36%) Time in Goal:  - Time in range 70-180: 43% - Time above range: 57% - Time below  range: 0% Observed patterns: Persistently elevated glucose levels   Lab Results  Component Value Date   HGBA1C 8.1 (A) 01/14/2023   There were no vitals filed for this visit.  Lipid Panel     Component Value Date/Time   CHOL 148 12/12/2021 1222   TRIG 85 12/12/2021 1222   HDL 57 12/12/2021 1222   CHOLHDL 2.6 12/12/2021 1222   CHOLHDL 4.1 01/20/2015 1003   VLDL 10 01/20/2015 1003   LDLCALC 75 12/12/2021 1222   LDLDIRECT 96 03/23/2021 0931   LDLDIRECT 223 (H) 10/13/2015 0932    Clinical Atherosclerotic Cardiovascular Disease (ASCVD): {YES/NO:21197} The 10-year ASCVD risk score (Arnett DK, et al., 2019) is: 27.2%   Values used to calculate the score:     Age: 64 years     Sex: Female     Is Non-Hispanic African American: Yes     Diabetic: Yes     Tobacco smoker: No     Systolic Blood Pressure: 127 mmHg     Is BP treated: Yes     HDL Cholesterol: 57 mg/dL     Total Cholesterol: 148 mg/dL    A/P: Diabetes longstanding *** currently ***. Patient is *** able to verbalize appropriate hypoglycemia management plan. Medication adherence appears ***. Control is suboptimal due to ***. -{Meds adjust:18428} basal insulin *** Lantus/Basaglar/Semglee (insulin glargine) *** Tresiba (insulin degludec) from *** units to *** units daily in the morning. Patient will continue to titrate 1 unit every *** days if fasting blood sugar > 100mg /dl until fasting blood sugars reach goal or next visit.  -{Meds adjust:18428} rapid insulin *** Novolog (insulin aspart) ***  Humalog (insulin lispro) from *** to ***.  -{Meds adjust:18428} GLP-1 *** Trulicity (dulaglutide) *** Ozempic (semaglutide) *** Mounjaro (tirzepatide) from *** mg to *** mg .  -{Meds adjust:18428} SGLT2-I *** Farxiga (dapagliflozin) *** Jardiance (empagliflozin) 10 mg. Counseled on sick day rules. -{Meds adjust:18428} metformin ***.  -Patient educated on purpose, proper use, and potential adverse effects of ***.  -Extensively discussed  pathophysiology of diabetes, recommended lifestyle interventions, dietary effects on blood sugar control.  -Counseled on s/sx of and management of hypoglycemia.  -Next A1c anticipated ***.   ASCVD risk - primary ***secondary prevention in patient with diabetes. Last LDL is *** not at goal of <40 *** mg/dL. ASCVD risk factors include *** and 10-year ASCVD risk score of ***. {Desc; low/moderate/high:110033} intensity statin indicated.  -{Meds adjust:18428} ***statin *** mg.   Hypertension longstanding *** currently ***. Blood pressure goal of <130/80 *** mmHg. Medication adherence ***. Blood pressure control is suboptimal due to ***. -{Meds adjust:18428} *** mg.  Written patient instructions provided. Patient verbalized understanding of treatment plan.  Total time in face to face counseling *** minutes.    Follow-up:  Pharmacist *** PCP clinic visit in *** Patient seen with ***

## 2023-07-05 ENCOUNTER — Ambulatory Visit: Admitting: Pharmacist

## 2023-07-05 ENCOUNTER — Encounter: Payer: Self-pay | Admitting: Family Medicine

## 2023-07-05 ENCOUNTER — Ambulatory Visit (INDEPENDENT_AMBULATORY_CARE_PROVIDER_SITE_OTHER): Admitting: Family Medicine

## 2023-07-05 ENCOUNTER — Encounter: Payer: Self-pay | Admitting: Pharmacist

## 2023-07-05 VITALS — BP 146/72 | HR 79 | Temp 97.3°F | Wt 257.8 lb

## 2023-07-05 DIAGNOSIS — I10 Essential (primary) hypertension: Secondary | ICD-10-CM | POA: Diagnosis not present

## 2023-07-05 DIAGNOSIS — R5383 Other fatigue: Secondary | ICD-10-CM

## 2023-07-05 DIAGNOSIS — N1832 Chronic kidney disease, stage 3b: Secondary | ICD-10-CM | POA: Diagnosis not present

## 2023-07-05 DIAGNOSIS — N183 Chronic kidney disease, stage 3 unspecified: Secondary | ICD-10-CM

## 2023-07-05 DIAGNOSIS — E1122 Type 2 diabetes mellitus with diabetic chronic kidney disease: Secondary | ICD-10-CM

## 2023-07-05 DIAGNOSIS — M5417 Radiculopathy, lumbosacral region: Secondary | ICD-10-CM

## 2023-07-05 LAB — LIPID PANEL
Chol/HDL Ratio: 2.8 ratio (ref 0.0–4.4)
Cholesterol, Total: 176 mg/dL (ref 100–199)
HDL: 62 mg/dL (ref >39)
LDL Chol Calc (NIH): 94 mg/dL (ref 0–99)
Triglycerides: 112 mg/dL (ref 0–149)
VLDL Cholesterol Cal: 20 mg/dL (ref 5–40)

## 2023-07-05 LAB — VITAMIN D 25 HYDROXY (VIT D DEFICIENCY, FRACTURES): Vit D, 25-Hydroxy: 25.7 ng/mL — ABNORMAL LOW (ref 30.0–100.0)

## 2023-07-05 LAB — BASIC METABOLIC PANEL
BUN/Creatinine Ratio: 17 (ref 12–28)
BUN: 29 mg/dL — ABNORMAL HIGH (ref 8–27)
CO2: 24 mmol/L (ref 20–29)
Calcium: 9.7 mg/dL (ref 8.7–10.3)
Chloride: 105 mmol/L (ref 96–106)
Creatinine, Ser: 1.7 mg/dL — ABNORMAL HIGH (ref 0.57–1.00)
Glucose: 110 mg/dL — ABNORMAL HIGH (ref 70–99)
Potassium: 5.8 mmol/L — ABNORMAL HIGH (ref 3.5–5.2)
Sodium: 143 mmol/L (ref 134–144)
eGFR: 31 mL/min/{1.73_m2} — ABNORMAL LOW (ref >59)

## 2023-07-05 MED ORDER — DEXCOM G7 SENSOR MISC: 11 refills | Status: DC

## 2023-07-05 NOTE — Patient Instructions (Signed)
 It was great to see you!  Our plans for today:  - No changes to your diabetes medications for now. Work on diet changes. I expect this to improve as your diet improves and your steroid injection gets out of your system.  - Take your blood pressure medications as discussed with Dr. Raymondo Band.  - Come back in 1 month.  We are checking some labs today, we will release these results to your MyChart.  Take care and seek immediate care sooner if you develop any concerns.   Dr. Linwood Dibbles

## 2023-07-05 NOTE — Assessment & Plan Note (Signed)
 Likely 2/2 poor sleep habits, prior notes also indicative of poor CPAP compliance, will reassess on f/u. Hyperglycemia can also be contributing. Obtaining labs today. Work on Physiological scientist. F/u 1 month.

## 2023-07-05 NOTE — Progress Notes (Signed)
   SUBJECTIVE:   CHIEF COMPLAINT / HPI:   Hypertension: - Medications: amlodipine, metoprolol, spironolactone. Restarted losartan-hydrochlorothiazide yesterday but hasn't taken yet. - Compliance: good - Checking BP at home: no - Denies any SOB, CP, vision changes, LE edema, medication SEs, or symptoms of hypotension  Diabetes, Type 2 - Last A1c 8.9 yesterday - Medications: jardiance, tresiba, humalog, metformin, ozempic - Compliance: good - Checking BG at home: yes has dexcom. 155 fasting this am. 187 now.  - had recent ESI for back.  - Diet: worse, ordering out more.  - Eye exam: UTD - Foot exam: due - Microalbumin: UTD - Statin: yes  Back pain - seeing Ortho, getting ESI, last injection 6 wks ago. Starting PT on Monday.   Fatigue - ongoing for the last month. Some trouble falling asleep with sleep onset around 12am-1am. Once asleep, sleeps well. Sleeps in until noon.  Will urinate few times per night.    OBJECTIVE:   BP (!) 146/72   Pulse 79   Temp (!) 97.3 F (36.3 C)   Wt 257 lb 12.8 oz (116.9 kg)   SpO2 94%   BMI 42.90 kg/m   Gen: well appearing, in NAD Card: RRR Lungs: CTAB Ext: WWP, no edema   ASSESSMENT/PLAN:   Essential hypertension Has not taken meds yet today including hyzaar restarted yesterday. Recheck BMP today given higher K yesterday. F/u 1 month for recheck. Continue to monitor BP at home.   DM (diabetes mellitus), type 2 with renal complications (HCC) Above goal. Likely exacerbated by recent ESI and poor dietary compliance recently. Elected to work on diet changes, now 6 weeks out from [employer]. No med changes today. F/u 1 month. Plan for foot exam next visit.  Fatigue Likely 2/2 poor sleep habits, prior notes also indicative of poor CPAP compliance, will reassess on f/u. Hyperglycemia can also be contributing. Obtaining labs today. Work on Physiological scientist. F/u 1 month.   Back pain - continue to f/u with Ortho.  Caro Laroche, DO

## 2023-07-05 NOTE — Telephone Encounter (Signed)
 Do I place this as a DME order? Tried to send via refill but don't see [facility] as an option? Thanks!

## 2023-07-05 NOTE — Progress Notes (Signed)
 Reviewed and agree with Dr Macky Lower plan.

## 2023-07-05 NOTE — Assessment & Plan Note (Signed)
 Diabetes longstanding and currently slightly worse today with A1C in office of 8.9. Patient is  able to verbalize appropriate hypoglycemia management plan. Medication adherence appears good for Diabetes medications. Control is suboptimal due to more sedentary activity with spinal stenosis - physical therapy scheduled to start next week. - Continued Jardiance (empagliflozin) 25 mg once daily - Continued Tresiba (insulin degludec) 25 units into the skin daily - Continued Insulin Humalog (insulin lispro) 16-18 units into the skin twice daily - Continued Glucophage-XR (metformin) 500 mg two-three times daily before meals. - Continued Ozempic (semaglutide) 1.6 mg into the skin once a week. - Patient educated on purpose, proper use, and potential adverse effects. - Extensively discussed pathophysiology of diabetes, recommended lifestyle interventions, dietary effects on blood sugar control.  Discussed impact of mobility to improve glucose readings as a key part of improving control.  - Counseled on s/sx of and management of hypoglycemia.

## 2023-07-05 NOTE — Assessment & Plan Note (Signed)
 Hypertension longstanding and currently worsened control in office. Blood pressure goal of <130/80 mmHg if tolerated. Medication adherence with lisinopril hydrochlorothiazide has been missed for multiple days. Blood pressure control is suboptimal due to medication supply. - Continued Norvasc (amlodipine) 5 mg once daily - RESTARTED Hyzaar (losartan-hydrochlorothiazide) 100-12.5 mg once daily (new prescription provided) - Continued Toprol-XL (metoprolol succinate) 50 mg once daily. - Continued Aldactone (spironolactone) 25 mg once daily. - BMET today

## 2023-07-05 NOTE — Assessment & Plan Note (Signed)
 Has not taken meds yet today including hyzaar restarted yesterday. Recheck BMP today given higher K yesterday. F/u 1 month for recheck. Continue to monitor BP at home.

## 2023-07-05 NOTE — Assessment & Plan Note (Addendum)
 Above goal. Likely exacerbated by recent ESI and poor dietary compliance recently. Elected to work on diet changes, now 6 weeks out from [employer]. No med changes today. F/u 1 month. Plan for foot exam next visit.

## 2023-07-06 LAB — CBC
Hematocrit: 39.6 % (ref 34.0–46.6)
Hemoglobin: 12.9 g/dL (ref 11.1–15.9)
MCH: 29 pg (ref 26.6–33.0)
MCHC: 32.6 g/dL (ref 31.5–35.7)
MCV: 89 fL (ref 79–97)
Platelets: 182 10*3/uL (ref 150–450)
RBC: 4.45 x10E6/uL (ref 3.77–5.28)
RDW: 14.1 % (ref 11.7–15.4)
WBC: 4.7 10*3/uL (ref 3.4–10.8)

## 2023-07-06 LAB — BASIC METABOLIC PANEL
BUN/Creatinine Ratio: 17 (ref 12–28)
BUN: 27 mg/dL (ref 8–27)
CO2: 23 mmol/L (ref 20–29)
Calcium: 9.3 mg/dL (ref 8.7–10.3)
Chloride: 102 mmol/L (ref 96–106)
Creatinine, Ser: 1.55 mg/dL — ABNORMAL HIGH (ref 0.57–1.00)
Glucose: 157 mg/dL — ABNORMAL HIGH (ref 70–99)
Potassium: 5.7 mmol/L — ABNORMAL HIGH (ref 3.5–5.2)
Sodium: 139 mmol/L (ref 134–144)
eGFR: 35 mL/min/{1.73_m2} — ABNORMAL LOW (ref >59)

## 2023-07-06 LAB — TSH: TSH: 1.65 u[IU]/mL (ref 0.450–4.500)

## 2023-07-08 ENCOUNTER — Encounter: Payer: Self-pay | Admitting: Family Medicine

## 2023-07-08 DIAGNOSIS — R2689 Other abnormalities of gait and mobility: Secondary | ICD-10-CM | POA: Diagnosis not present

## 2023-07-08 DIAGNOSIS — M6281 Muscle weakness (generalized): Secondary | ICD-10-CM | POA: Diagnosis not present

## 2023-07-08 DIAGNOSIS — M25569 Pain in unspecified knee: Secondary | ICD-10-CM | POA: Diagnosis not present

## 2023-07-08 DIAGNOSIS — M5459 Other low back pain: Secondary | ICD-10-CM | POA: Diagnosis not present

## 2023-07-08 NOTE — Addendum Note (Signed)
 Addended by: Caro Laroche on: 07/08/2023 10:28 AM   Modules accepted: Orders

## 2023-07-15 NOTE — Telephone Encounter (Signed)
 Prescription signed and placed in fax pile.

## 2023-07-16 ENCOUNTER — Other Ambulatory Visit: Payer: Self-pay

## 2023-07-17 DIAGNOSIS — M170 Bilateral primary osteoarthritis of knee: Secondary | ICD-10-CM | POA: Diagnosis not present

## 2023-07-17 MED ORDER — ROSUVASTATIN CALCIUM 20 MG PO TABS: 20.0000 mg | ORAL_TABLET | Freq: Every day | ORAL | 3 refills | Status: DC

## 2023-07-18 ENCOUNTER — Ambulatory Visit: Payer: Medicare Other | Admitting: Nurse Practitioner

## 2023-07-18 ENCOUNTER — Encounter: Payer: Self-pay | Admitting: Nurse Practitioner

## 2023-07-18 VITALS — BP 126/70 | HR 69 | Ht 65.0 in | Wt 253.2 lb

## 2023-07-18 DIAGNOSIS — E1129 Type 2 diabetes mellitus with other diabetic kidney complication: Secondary | ICD-10-CM | POA: Diagnosis not present

## 2023-07-18 DIAGNOSIS — D869 Sarcoidosis, unspecified: Secondary | ICD-10-CM

## 2023-07-18 DIAGNOSIS — G4733 Obstructive sleep apnea (adult) (pediatric): Secondary | ICD-10-CM

## 2023-07-18 NOTE — Progress Notes (Unsigned)
 @Patient  ID: Rebecca Hicks, female    DOB: October 12, 1945, 78 y.o.   MRN: [id number]  Chief Complaint  Patient presents with   Consult    Patient states her CPAP chokes her up at night.     Referring provider: Leslye Peer, MD  HPI: 78 year old female, former smoker referred for sleep consult.  She is followed by Dr. Delton Coombes for sarcoidosis.  Past medical history significant for hypertension, OSA, GERD, diabetes, retinopathy, CKD, HLD.  TEST/EVENTS:  10/21/2015 NPSG: AHI 31.5/h, SpO2 low 80%; weight 294 lb 08/02/2016 CPAP titration >> trial of CPAP 10 cmH2O and 3 L/min supplemental O2 bled through; weight 294 pounds  07/18/2023: Today - sleep consult Discussed the use of AI scribe software for clinical note transcription with the patient, who gave verbal consent to proceed.  History of Present Illness   Rebecca Hicks is a 78 year old female with severe sleep apnea who presents with difficulty using CPAP therapy.  She was diagnosed with severe sleep apnea in 2017 and started on CPAP therapy. A titration study in September 2018 indicated a requirement of 10 cm of water pressure. Initially, she used the CPAP consistently but began experiencing issues with the machine, leading to a replacement. The new machine worked well for a couple of years.  Approximately a year ago, she developed bronchitis and began experiencing symptoms such as waking up with mucus, coughing, and a burning sensation when using the CPAP. Adjustments to the CPAP pressure provided temporary relief, but symptoms recurred, leading her to discontinue its use. She has not used the CPAP since then.  Her weight has decreased by about 50 pounds since her original sleep study in 2017, which may have affected her CPAP pressure needs. She consumes three cups of coffee daily and occasionally drinks alcohol at social events. She does not take any sleep medications.  She feels tired frequently but does not have trouble falling  asleep. She experiences nocturia, waking two to three times per night to urinate, which is often prompted by changing positions in bed. No snoring, morning headaches, drowsy driving, or sleepwalking.     Goes to bed between 11 PM and 2 AM.  Typically falls asleep within 30 minutes to an hour.  Wakes 2-3 times a night.  Gets up around 10 AM to 12 PM.    Lives alone  [address] [address]  Allergies  Allergen Reactions   Penicillins Rash and Other (See Comments)    Has patient had a PCN reaction causing immediate rash, facial/tongue/throat swelling, SOB or lightheadedness with hypotension: Yes Has patient had a PCN reaction causing severe rash involving mucus membranes or skin necrosis: No Has patient had a PCN reaction that required hospitalization: No Has patient had a PCN reaction occurring within the last 10 years: Yes If all of the above answers are "NO", then may proceed with Cephalosporin use.     Immunization History  Administered Date(s) Administered   Fluad Quad(high Dose 65+) 02/19/2019, 03/23/2021, 04/10/2022   Influenza Split 02/13/2011, 03/12/2012   Influenza Whole 04/03/2007, 02/23/2008, 02/25/2009   Influenza, High Dose Seasonal PF 02/26/2018   Influenza,inj,Quad PF,6+ Mos 03/04/2013, 04/07/2014, 02/15/2015, 12/15/2015, 03/13/2017, 04/05/2020   PFIZER(Purple Top)SARS-COV-2 Vaccination 06/13/2019, 07/07/2019, 04/05/2020   Pfizer Covid-19 Vaccine Bivalent Booster 51yrs & up 03/23/2021   Pfizer(Comirnaty)Fall Seasonal Vaccine 12 years and older 09/19/2022   Pneumococcal Conjugate-13 04/07/2014   Pneumococcal Polysaccharide-23 03/12/2012   Td 02/25/2009   Tdap 03/26/2022    Past Medical History:  Diagnosis Date   Abdominal pain 12/05/2011   Abdominal pain, left upper quadrant 12/03/2012   Acute on chronic renal failure (HCC) 12/25/2012   Allergy    SEASONAL   Arthritis    Arthropathy 11/24/2016   cervical spine   Ataxia 12/15/2015   Atypical chest pain 03/28/2016    Cataract    BILATERAL-REMOVED   CERUMEN IMPACTION, RIGHT 01/31/2010   Qualifier: Diagnosis of  By: Benjamin Stain MD, Thomas     Chest tightness 02/23/2014   Chronic kidney disease (CKD), stage II (mild) 03/05/2013   Looks like baseline creat = 1.2 -1.3.  Watch for overdiuresis.    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 - She was fitted with a Small size  [employer] Full Face Mask Simplus mask and heated humidification.     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, POSITIVE 02/07/2007   Annotation: with clear CXR mid 1990's Qualifier: History of  By: Laural Benes RN, Erika     TOBACCO USE, QUIT 02/07/2007   Qualifier: Diagnosis of  By: Leveda Anna MD, William     Tremor of both hands 12/15/2015   U R I 03/15/2010   Qualifier: Diagnosis of  By: Leveda Anna MD, Chrissie Noa      Tobacco History: Social History   Tobacco Use  Smoking Status Former   Current packs/day: 0.00   Average packs/day: 1 pack/day for 20.0 years (20.0 ttl pk-yrs)   Types: Cigarettes   Start date: 04/24/1975   Quit date: 04/24/1995   Years since quitting: 28.2   Passive exposure: Never  Smokeless Tobacco Never   Counseling given: Not Answered   Outpatient Medications Prior to Visit  Medication Sig Dispense Refill   acetaminophen (TYLENOL) 650 MG CR tablet Take 650 mg by mouth every 8 (eight) hours  as needed.      albuterol (PROAIR HFA) 108 (90 Base) MCG/ACT inhaler Inhale 2 puffs into the lungs every 6 (six) hours as needed for wheezing or shortness of breath. 1 each 2   amLODipine (NORVASC) 5 MG tablet Take 1 tablet (5 mg total) by mouth daily. 90 tablet 3   aspirin 81 MG tablet Take 81 mg by mouth daily.     Bempedoic Acid-Ezetimibe (NEXLIZET) 180-10 MG TABS Take 1 tablet by mouth daily. 90 tablet 2   Continuous Blood Gluc Receiver (DEXCOM G6 RECEIVER) DEVI 1 Device by Does not apply route as directed. Use with Dexcom G6 sensor and transmitter 1 each 1   Continuous Glucose Sensor (DEXCOM G7 SENSOR) MISC Apply 1 sensor every 10 days. 3 each 11   DULoxetine (CYMBALTA) 60 MG capsule Take 1 capsule (60 mg total) by mouth daily. 90 capsule 3   empagliflozin (JARDIANCE) 25 MG TABS tablet Take 1 tablet (25 mg total) by mouth daily. 90 tablet 3   glucose blood (ACCU-CHEK SMARTVIEW) test strip Use as instructed 100 each 12   insulin aspart (NOVOLOG FLEXPEN) 100 UNIT/ML FlexPen Inject 12 Units  into the skin 2 (two) times daily with a meal. 15 mL 11   insulin degludec (TRESIBA FLEXTOUCH) 100 UNIT/ML FlexTouch Pen Inject 25 Units into the skin daily. 15 mL 0   Insulin Pen Needle (B-D ULTRAFINE III SHORT PEN) 31G X 8 MM MISC USE TO INJECT INSULIN 4 times daily. 360 each 3   losartan-hydrochlorothiazide (HYZAAR) 100-12.5 MG tablet Take 1 tablet by mouth daily. 90 tablet 3   metFORMIN (GLUCOPHAGE-XR) 500 MG 24 hr tablet Take 2 tablets (1,000 mg total) by mouth daily. 270 tablet 3   metoprolol succinate (TOPROL-XL) 50 MG 24 hr tablet Take 1 tablet (50 mg total) by mouth daily. Take with or immediately following a meal. 90 tablet 3   nitroGLYCERIN (NITROSTAT) 0.4 MG SL tablet Place 1 tablet (0.4 mg total) under the tongue every 5 (five) minutes as needed for chest pain. 25 tablet 3   omeprazole (PRILOSEC) 40 MG capsule Take 1 capsule (40 mg total) by mouth daily as needed. 90 capsule 3   rosuvastatin (CRESTOR) 20 MG tablet Take 1 tablet (20 mg total) by mouth daily. 90 tablet 3   Semaglutide, 2 MG/DOSE, (OZEMPIC, 2 MG/DOSE,) 8 MG/3ML SOPN Inject 2 mg into the skin once a week.     spironolactone (ALDACTONE) 25 MG tablet Take 1 tablet (25 mg total) by mouth daily. 90 tablet 3   Accu-Chek Softclix Lancets lancets Use as instructed (Patient not taking: Reported on 07/18/2023) 100 each 12   timolol (TIMOPTIC) 0.5 % ophthalmic solution Place 1 drop into both eyes 2 times daily.     No facility-administered medications prior to visit.     Review of Systems:   Constitutional: No night sweats, fevers, chills,or lassitude. +fatigue, weight loss HEENT: No headaches, difficulty swallowing, tooth/dental problems, or sore throat. No sneezing, itching, ear ache, nasal congestion, or post nasal drip CV:  No chest pain, orthopnea, PND, swelling in lower extremities, anasarca, dizziness, palpitations, syncope Resp: +hx of snoring; chronic dry cough. No shortness of breath with exertion. No excess mucus or  change in color of mucus. No productive or non-productive. No hemoptysis. No wheezing.  No chest wall deformity GI:  No heartburn, indigestion, abdominal pain, nausea, vomiting, diarrhea, change in bowel habits, loss of appetite, bloody stools.  GU: +nocturia  Skin: No rash, lesions, ulcerations MSK:  +chronic  joint pains Neuro: No dizziness or lightheadedness.  Psych: No depression or anxiety. Mood stable. +sleep disturbance     Physical Exam:  BP 126/70 (BP Location: Left Arm, Patient Position: Sitting, Cuff Size: Large)   Pulse 69   Ht 5\' 5"  (1.651 m)   Wt 253 lb 3.2 oz (114.9 kg)   SpO2 100%   BMI 42.13 kg/m   GEN: Pleasant, interactive, well-kempt; obese; in no acute distress HEENT:  Normocephalic and atraumatic. PERRLA. Sclera white. Nasal turbinates pink, moist and patent bilaterally. No rhinorrhea present. Oropharynx pink and moist, without exudate or edema. No lesions, ulcerations, or postnasal drip. Mallampati II/III NECK:  Supple w/ fair ROM. Thyroid symmetrical with no goiter or nodules palpated. No lymphadenopathy.   CV: RRR, no m/r/g, no peripheral edema. Pulses intact, +2 bilaterally. No cyanosis, pallor or clubbing. PULMONARY:  Unlabored, regular breathing. Clear bilaterally A&P w/o wheezes/rales/rhonchi. No accessory muscle use.  GI: BS present and normoactive. Soft, non-tender to palpation. No organomegaly or masses detected.  MSK: No erythema, warmth or tenderness. Cap refil <2 sec all extrem. No deformities or joint swelling noted.  Neuro: A/Ox3. No focal deficits noted.   Skin: Warm, no lesions or rashe Psych: Normal affect and behavior. Judgement and thought content appropriate.     Lab Results:  CBC    Component Value Date/Time   WBC 4.7 07/05/2023 1657   WBC 6.7 09/23/2017 1622   RBC 4.45 07/05/2023 1657   RBC 4.86 09/23/2017 1622   HGB 12.9 07/05/2023 1657   HCT 39.6 07/05/2023 1657   PLT 182 07/05/2023 1657   MCV 89 07/05/2023 1657   MCH 29.0  07/05/2023 1657   MCH 27.4 09/23/2017 1622   MCHC 32.6 07/05/2023 1657   MCHC 30.6 09/23/2017 1622   RDW 14.1 07/05/2023 1657   LYMPHSABS 1.6 09/23/2017 1622   MONOABS 0.7 09/23/2017 1622   EOSABS 0.2 09/23/2017 1622   BASOSABS 0.1 09/23/2017 1622    BMET    Component Value Date/Time   NA 139 07/05/2023 1657   K 5.7 (H) 07/05/2023 1657   CL 102 07/05/2023 1657   CO2 23 07/05/2023 1657   GLUCOSE 157 (H) 07/05/2023 1657   GLUCOSE 171 (H) 01/04/2021 1140   BUN 27 07/05/2023 1657   CREATININE 1.55 (H) 07/05/2023 1657   CREATININE 1.43 (H) 02/28/2016 1553   CALCIUM 9.3 07/05/2023 1657   GFRNONAA 37 (L) 05/31/2020 1346   GFRNONAA 38 (L) 12/24/2012 1624   GFRAA 43 (L) 05/31/2020 1346   GFRAA 43 (L) 12/24/2012 1624    BNP    Component Value Date/Time   BNP 12.9 11/27/2021 1434   BNP 23.7 02/28/2016 1553     Imaging:  No results found.  Administration History     None          Latest Ref Rng & Units 06/25/2022    3:19 PM 12/30/2019   11:52 AM  PFT Results  FVC-Pre L 2.62  3.07   FVC-Predicted Pre % 91  128   FVC-Post L 2.72  3.12   FVC-Predicted Post % 94  130   Pre FEV1/FVC % % 85  86   Post FEV1/FCV % % 87  88   FEV1-Pre L 2.24  2.65   FEV1-Predicted Pre % 103  143   FEV1-Post L 2.38  2.73   DLCO uncorrected ml/min/mmHg 17.03  18.31   DLCO UNC% % 87  91   DLCO corrected ml/min/mmHg 17.03  18.31   DLCO  COR %Predicted % 87  91   DLVA Predicted % 99  91   TLC L 5.19  6.19   TLC % Predicted % 101  118   RV % Predicted % 83  109     No results found for: "NITRICOXIDE"      Assessment & Plan:   Obstructive sleep apnea She has a history of severe OSA. Difficulties with CPAP following weight loss and machine problems. Not currently on therapy. She does not notice any snoring now. She does have excessive daytime sleepiness, nocturia, restless sleep. BMI 42. Given this,  I am concerned she still could have sleep disordered breathing with obstructive sleep  apnea. She will need a repeat sleep study for further evaluation.    - discussed how weight can impact sleep and risk for sleep disordered breathing - discussed options to assist with weight loss: combination of diet modification, cardiovascular and strength training exercises   - had an extensive discussion regarding the adverse health consequences related to untreated sleep disordered breathing - specifically discussed the risks for hypertension, coronary artery disease, cardiac dysrhythmias, cerebrovascular disease, and diabetes - lifestyle modification discussed   - discussed how sleep disruption can increase risk of accidents, particularly when driving - safe driving practices were discussed  Patient Instructions  Given your symptoms and history, I am concerned that you still have sleep disordered breathing with sleep apnea. You will need a sleep study for further evaluation. Someone will contact you to schedule this.   We discussed how untreated sleep apnea puts an individual at risk for cardiac arrhthymias, pulm HTN, DM, stroke and increases their risk for daytime accidents. We also briefly reviewed treatment options including weight loss, side sleeping position, oral appliance, CPAP therapy or referral to ENT for possible surgical options  Use caution when driving and pull over if you become sleepy.  Follow up in 6 weeks with Katie Domique Clapper,NP to go over sleep study results, or sooner, if needed. Friday PM virtual clinic preferred       Morbid obesity (HCC) BMI 42. Healthy weight loss encouraged.   SARCOIDOSIS, PULMONARY Stable. Follow up with Dr. Delton Coombes as scheduled   Advised if symptoms do not improve or worsen, to please contact office for sooner follow up or seek emergency care.   I spent 35 minutes of dedicated to the care of this patient on the date of this encounter to include pre-visit review of records, face-to-face time with the patient discussing conditions above, post  visit ordering of testing, clinical documentation with the electronic health record, making appropriate referrals as documented, and communicating necessary findings to members of the patients care team.  Noemi Chapel, NP 07/19/2023  Pt aware and understands NP's role.

## 2023-07-18 NOTE — Patient Instructions (Signed)
 Given your symptoms and history, I am concerned that you still have sleep disordered breathing with sleep apnea. You will need a sleep study for further evaluation. Someone will contact you to schedule this.   We discussed how untreated sleep apnea puts an individual at risk for cardiac arrhthymias, pulm HTN, DM, stroke and increases their risk for daytime accidents. We also briefly reviewed treatment options including weight loss, side sleeping position, oral appliance, CPAP therapy or referral to ENT for possible surgical options  Use caution when driving and pull over if you become sleepy.  Follow up in 6 weeks with Katie Calistro Rauf,NP to go over sleep study results, or sooner, if needed. Friday PM virtual clinic preferred

## 2023-07-19 ENCOUNTER — Encounter: Payer: Self-pay | Admitting: Nurse Practitioner

## 2023-07-19 DIAGNOSIS — N1832 Chronic kidney disease, stage 3b: Secondary | ICD-10-CM | POA: Diagnosis not present

## 2023-07-19 NOTE — Assessment & Plan Note (Signed)
Stable. Follow up with Dr. Delton Coombes as scheduled.

## 2023-07-19 NOTE — Assessment & Plan Note (Signed)
 BMI 42. Healthy weight loss encouraged.

## 2023-07-19 NOTE — Assessment & Plan Note (Signed)
 She has a history of severe OSA. Difficulties with CPAP following weight loss and machine problems. Not currently on therapy. She does not notice any snoring now. She does have excessive daytime sleepiness, nocturia, restless sleep. BMI 42. Given this,  I am concerned she still could have sleep disordered breathing with obstructive sleep apnea. She will need a repeat sleep study for further evaluation.    - discussed how weight can impact sleep and risk for sleep disordered breathing - discussed options to assist with weight loss: combination of diet modification, cardiovascular and strength training exercises   - had an extensive discussion regarding the adverse health consequences related to untreated sleep disordered breathing - specifically discussed the risks for hypertension, coronary artery disease, cardiac dysrhythmias, cerebrovascular disease, and diabetes - lifestyle modification discussed   - discussed how sleep disruption can increase risk of accidents, particularly when driving - safe driving practices were discussed  Patient Instructions  Given your symptoms and history, I am concerned that you still have sleep disordered breathing with sleep apnea. You will need a sleep study for further evaluation. Someone will contact you to schedule this.   We discussed how untreated sleep apnea puts an individual at risk for cardiac arrhthymias, pulm HTN, DM, stroke and increases their risk for daytime accidents. We also briefly reviewed treatment options including weight loss, side sleeping position, oral appliance, CPAP therapy or referral to ENT for possible surgical options  Use caution when driving and pull over if you become sleepy.  Follow up in 6 weeks with Katie San Lohmeyer,NP to go over sleep study results, or sooner, if needed. Friday PM virtual clinic preferred

## 2023-07-22 NOTE — Telephone Encounter (Signed)
 Fax received and order signed 3/26.

## 2023-07-23 DIAGNOSIS — Z794 Long term (current) use of insulin: Secondary | ICD-10-CM | POA: Diagnosis not present

## 2023-07-23 DIAGNOSIS — E113313 Type 2 diabetes mellitus with moderate nonproliferative diabetic retinopathy with macular edema, bilateral: Secondary | ICD-10-CM | POA: Diagnosis not present

## 2023-07-23 DIAGNOSIS — E1122 Type 2 diabetes mellitus with diabetic chronic kidney disease: Secondary | ICD-10-CM | POA: Diagnosis not present

## 2023-07-23 DIAGNOSIS — N1832 Chronic kidney disease, stage 3b: Secondary | ICD-10-CM | POA: Diagnosis not present

## 2023-07-23 DIAGNOSIS — Z7984 Long term (current) use of oral hypoglycemic drugs: Secondary | ICD-10-CM | POA: Diagnosis not present

## 2023-07-23 DIAGNOSIS — N2581 Secondary hyperparathyroidism of renal origin: Secondary | ICD-10-CM | POA: Diagnosis not present

## 2023-07-23 DIAGNOSIS — I129 Hypertensive chronic kidney disease with stage 1 through stage 4 chronic kidney disease, or unspecified chronic kidney disease: Secondary | ICD-10-CM | POA: Diagnosis not present

## 2023-07-23 DIAGNOSIS — E559 Vitamin D deficiency, unspecified: Secondary | ICD-10-CM | POA: Diagnosis not present

## 2023-07-25 ENCOUNTER — Telehealth: Payer: Self-pay

## 2023-07-25 NOTE — Telephone Encounter (Signed)
 Copied from CRM [id number]. Topic: Clinical - Medical Advice >> Jul 25, 2023  2:44 PM Chantha C wrote: Reason for CRM: Patient has not received the at home kit sleep study yet, patient has an upcoming appt for 09/11/23. Please advise and call back at [phone].  I called and spoke to pt. Pt stated she has not had her HST scheduled, nor received a kit for it, I asked pt if she as heard from [facility] and she denied. I gave pt [facility]'s phone number and website for her to contact and register. Pt verbalized understanding. NFN

## 2023-07-30 ENCOUNTER — Telehealth: Payer: Self-pay

## 2023-07-30 NOTE — Telephone Encounter (Signed)
 Rec'd novo nordisk shipment.   Ozempic 2mg  dose pens - 4 boxes Novolog flexpens - 3 boxes (6 month supply) Tresiba U100 - 2 boxes (4 month supply).

## 2023-08-01 ENCOUNTER — Other Ambulatory Visit: Payer: Self-pay

## 2023-08-01 DIAGNOSIS — M5417 Radiculopathy, lumbosacral region: Secondary | ICD-10-CM

## 2023-08-01 MED ORDER — DULOXETINE HCL 60 MG PO CPEP: 60.0000 mg | ORAL_CAPSULE | Freq: Every day | ORAL | 3 refills | Status: AC

## 2023-08-05 ENCOUNTER — Encounter

## 2023-08-05 DIAGNOSIS — G4733 Obstructive sleep apnea (adult) (pediatric): Secondary | ICD-10-CM

## 2023-08-05 DIAGNOSIS — G4730 Sleep apnea, unspecified: Secondary | ICD-10-CM | POA: Diagnosis not present

## 2023-08-18 ENCOUNTER — Telehealth: Payer: Self-pay | Admitting: Pulmonary Disease

## 2023-08-18 DIAGNOSIS — G4733 Obstructive sleep apnea (adult) (pediatric): Secondary | ICD-10-CM

## 2023-08-18 NOTE — Telephone Encounter (Signed)
 Call patient  Sleep study result  Date of study: 08/05/2023  Impression: Severe obstructive sleep apnea with moderately severe oxygen desaturations.  AHI of 37.2, oxygen nadir of 81%  Recommendation: DME referral  Recommend CPAP therapy for severe obstructive sleep apnea  Auto titrating CPAP with pressure settings of 5- 20 will be appropriate, with patient's mask of choice  Encourage weight loss measures  Follow-up in the office 4 to 6 weeks following initiation of treatment

## 2023-08-19 NOTE — Telephone Encounter (Signed)
 Spoke with patient regarding sleep study   Call patient   Sleep study result   Date of study: 08/05/2023   Impression: Severe obstructive sleep apnea with moderately severe oxygen desaturations.  AHI of 37.2, oxygen nadir of 81%   Recommendation: DME referral   Recommend CPAP therapy for severe obstructive sleep apnea   Auto titrating CPAP with pressure settings of 5- 20 will be appropriate, with patient's mask of choice   Encourage weight loss measures   Follow-up in the office 4 to 6 weeks following initiation of treatment Advised patient I will place a order over to [employer] for pressure  setting     Patient's voice was understanding. Nothing else further needed.

## 2023-08-20 DIAGNOSIS — M170 Bilateral primary osteoarthritis of knee: Secondary | ICD-10-CM | POA: Diagnosis not present

## 2023-08-26 NOTE — Telephone Encounter (Signed)
 Pt inquired about her med shipment. Informed her shipment is ready for pickup at the office.

## 2023-08-27 ENCOUNTER — Encounter: Payer: Self-pay | Admitting: Family Medicine

## 2023-08-27 DIAGNOSIS — E113313 Type 2 diabetes mellitus with moderate nonproliferative diabetic retinopathy with macular edema, bilateral: Secondary | ICD-10-CM | POA: Diagnosis not present

## 2023-08-27 DIAGNOSIS — M170 Bilateral primary osteoarthritis of knee: Secondary | ICD-10-CM | POA: Diagnosis not present

## 2023-08-29 NOTE — Telephone Encounter (Signed)
 Patient's sister, Virginia , presents to clinic to pick up shipment.   Shipment provided per note from [place].   Elsie Halo, RN

## 2023-09-03 DIAGNOSIS — M170 Bilateral primary osteoarthritis of knee: Secondary | ICD-10-CM | POA: Diagnosis not present

## 2023-09-09 DIAGNOSIS — E113313 Type 2 diabetes mellitus with moderate nonproliferative diabetic retinopathy with macular edema, bilateral: Secondary | ICD-10-CM | POA: Diagnosis not present

## 2023-09-09 DIAGNOSIS — H35342 Macular cyst, hole, or pseudohole, left eye: Secondary | ICD-10-CM | POA: Diagnosis not present

## 2023-09-09 DIAGNOSIS — H18422 Band keratopathy, left eye: Secondary | ICD-10-CM | POA: Diagnosis not present

## 2023-09-09 DIAGNOSIS — Z961 Presence of intraocular lens: Secondary | ICD-10-CM | POA: Diagnosis not present

## 2023-09-11 ENCOUNTER — Ambulatory Visit: Admitting: Nurse Practitioner

## 2023-09-18 DIAGNOSIS — Z961 Presence of intraocular lens: Secondary | ICD-10-CM | POA: Diagnosis not present

## 2023-09-18 DIAGNOSIS — Z9889 Other specified postprocedural states: Secondary | ICD-10-CM | POA: Diagnosis not present

## 2023-09-18 DIAGNOSIS — H35342 Macular cyst, hole, or pseudohole, left eye: Secondary | ICD-10-CM | POA: Diagnosis not present

## 2023-09-18 DIAGNOSIS — E113313 Type 2 diabetes mellitus with moderate nonproliferative diabetic retinopathy with macular edema, bilateral: Secondary | ICD-10-CM | POA: Diagnosis not present

## 2023-09-20 ENCOUNTER — Other Ambulatory Visit: Payer: Self-pay

## 2023-09-20 MED ORDER — DEXCOM G7 SENSOR MISC: 11 refills | Status: DC

## 2023-09-25 ENCOUNTER — Telehealth: Payer: Self-pay

## 2023-09-25 MED ORDER — DEXCOM G7 SENSOR MISC: 11 refills | Status: DC

## 2023-09-25 NOTE — Telephone Encounter (Signed)
 Patient calls nurse line in regards to [employer].   She reports she has been working with her [employer] and was told prescription needs to go to [facility] for coverage.   Will resend to [employer].  Patient was appreciative.

## 2023-09-30 ENCOUNTER — Telehealth: Payer: Self-pay | Admitting: Family Medicine

## 2023-09-30 NOTE — Telephone Encounter (Signed)
 Please schedule after 10/05/2023.   Thanks!

## 2023-09-30 NOTE — Telephone Encounter (Signed)
 Patient has a "Annual Care Visit" gap per [facility]. Called patient to schedule an annual physical with their PCP.    Patient was identified as falling into the [place] Measure - Diabetes.   Patient was: Left voicemail to schedule with primary care provider.   Appointment Note: Physical/A1C.   Thanks!

## 2023-10-14 ENCOUNTER — Ambulatory Visit (INDEPENDENT_AMBULATORY_CARE_PROVIDER_SITE_OTHER): Admitting: Family Medicine

## 2023-10-14 ENCOUNTER — Encounter: Payer: Self-pay | Admitting: Family Medicine

## 2023-10-14 VITALS — BP 133/70 | HR 92 | Temp 99.3°F | Ht 65.0 in | Wt 250.5 lb

## 2023-10-14 DIAGNOSIS — J029 Acute pharyngitis, unspecified: Secondary | ICD-10-CM

## 2023-10-14 NOTE — Progress Notes (Signed)
    SUBJECTIVE:   CHIEF COMPLAINT / HPI: sore throat  Last 3 days Sore throat Ache Has had some chills Measured normal temperature this AM Has some productive cough of yellow phelgm Has heard herself wheeze But no frank difficulty breathing. No nausea or vomiting. Did have some increased sneezing the past 3 days.  No blood in cough. Eating and drinking normally.  PERTINENT  PMH / PSH: Pulmonary Sarcoidosis, Former smoker, OSA  OBJECTIVE:   BP 133/70   Pulse 92   Temp 99.3 F (37.4 C) (Oral)   Ht 5' 5 (1.651 m)   Wt 250 lb 8 oz (113.6 kg)   SpO2 97%   BMI 41.69 kg/m   General: NAD  Neuro: A&O HEENT: mild inflammation of bilateral nasal turbinates and posterior oropharyngeal erythema, right ear cerumen present Cardiovascular: RRR, no murmurs, no peripheral edema Respiratory: normal WOB on RA, CTAB, no wheezes, ronchi or rales Abdomen: soft, NTTP, no rebound or guarding Extremities: Moving all 4 extremities equally  COVID swab - error with control sample  ASSESSMENT/PLAN:   Assessment & Plan Sore throat Clinical symptoms and exam consistent with viral upper respiratory infection.  No clinical signs of pneumonia or bacterial sinusitis.  Will treat with conservative care discussed in AVS.  Strict return precautions discussed with patient.  Fortunately there was an error with the control sample, I have messaged the patient disclosing this and offered repeat swab for nurse visit.  Return if symptoms worsen or fail to improve.  Rebecca Provencal, MD [facility]

## 2023-10-14 NOTE — Patient Instructions (Signed)
 It was great to see you! Thank you for allowing me to participate in your care!  Our plans for today:  - You have a viral illness causing your symptoms. - This will get better in the next several days. - You may use Tylenol  and as needed for pain. - Over the counter allergy medicine such as Claritin and Flonase may help with your congestion. - Cough drops may help with your throat and cough. - If you do not get better in the next 5 days please return to care. - It is important to stay hydrated, and continue eating while sick.    Please arrive 15 minutes PRIOR to your next scheduled appointment time! If you do not, this affects OTHER patients' care.  Take care and seek immediate care sooner if you develop any concerns.   Ozell Provencal, MD, PGY-2 [facility] 4:41 PM 10/14/2023  [facility]

## 2023-10-16 ENCOUNTER — Ambulatory Visit

## 2023-10-16 DIAGNOSIS — R051 Acute cough: Secondary | ICD-10-CM

## 2023-10-16 DIAGNOSIS — U071 COVID-19: Secondary | ICD-10-CM

## 2023-10-16 LAB — POC SOFIA SARS ANTIGEN FIA: SARS Coronavirus 2 Ag: POSITIVE — AB

## 2023-10-16 NOTE — Progress Notes (Signed)
 Patient presents to nurse clinic for repeat covid test. Patient reports continued cough and weakness.  She denies any worsening symptoms. No fevers or shortness of breath.   Patients covid test today was POSITIVE.  Will forward to provider who saw patient for possible antiviral therapy.   Discussed conservative measures with patient. Encouraged hydration.

## 2023-10-17 ENCOUNTER — Telehealth: Payer: Self-pay

## 2023-10-17 DIAGNOSIS — G4736 Sleep related hypoventilation in conditions classified elsewhere: Secondary | ICD-10-CM

## 2023-10-17 DIAGNOSIS — J189 Pneumonia, unspecified organism: Secondary | ICD-10-CM

## 2023-10-17 MED ORDER — ALBUTEROL SULFATE HFA 108 (90 BASE) MCG/ACT IN AERS
2.0000 | INHALATION_SPRAY | Freq: Four times a day (QID) | RESPIRATORY_TRACT | 2 refills | Status: AC | PRN
Start: 2023-10-17 — End: ?

## 2023-10-17 NOTE — Telephone Encounter (Signed)
 Patient calls nurse line requesting to speak with either Dr. Alba or Dr. Madelon regarding recent COVID diagnosis.   She is also inquiring about status of antiviral therapy.   Best contact number for patient is [phone].  Chiquita JAYSON English, RN

## 2023-10-17 NOTE — Telephone Encounter (Signed)
 Called patient regarding recent call the clinic about her COVID diagnosis.  Discussed that she is 6 days out from viral symptoms and thus out side of Paxlovid treatment window, additionally this would require holding several medications that are important to other medical problems.  Reports that she is overall improved since I saw her in clinic, her symptoms have been treated with as needed Tylenol .  Recommended she follow-up next week if she continues to have symptoms outside of postviral cough and congestion.  Discussed tricked return precautions.  Recommended she wear a mask around other people over the next week.

## 2023-10-21 ENCOUNTER — Telehealth: Payer: Self-pay | Admitting: Nurse Practitioner

## 2023-10-21 NOTE — Telephone Encounter (Signed)
 Copied from CRM [id number]. Topic: Appointments - Scheduling Inquiry for Clinic >> Oct 21, 2023  2:14 PM Russell PARAS wrote: Reason for CRM:   Pt contacted office to see if her 07/01 appt at 11:30 could be made a virtual appt. Contacted CAL who confirmed it could be virtual. In order to switch visit mode to virtual, we have to cancel appt first. When going to reschedule to virtual, would not allow me bc it appears the appt was a double booking.   Could you please take the 07/01 appt that was in person at 11:30 AM and make it virtual. She was diagnosed with COVID and is currently in quarantine  CB#  [phone] >> Oct 21, 2023  2:40 PM Charlanne KIDD wrote: HIPOLITO states they tried to change her 7/1 appt to a MYCHART at 11:30. It shows she had a MYCHART appt 7/1 @ 11:30 but someone else had that appt also since 6/25. I am not sure what to do here, Can you advise?    **Patient was originally scheduled for the 10/22/2023 and the other conflicting appt was an approved double book by [facility]. This is why when the attempt to switch the visit was limited because there is another appt scheduled alongside this patient.   - Please contact the patient to notify that the appt has been successfully switched to a MyChart VV.

## 2023-10-22 ENCOUNTER — Telehealth: Admitting: Nurse Practitioner

## 2023-10-22 ENCOUNTER — Encounter: Payer: Self-pay | Admitting: Nurse Practitioner

## 2023-10-22 ENCOUNTER — Ambulatory Visit: Admitting: Nurse Practitioner

## 2023-10-22 VITALS — Ht 65.0 in | Wt 248.0 lb

## 2023-10-22 DIAGNOSIS — Z87891 Personal history of nicotine dependence: Secondary | ICD-10-CM

## 2023-10-22 DIAGNOSIS — D869 Sarcoidosis, unspecified: Secondary | ICD-10-CM | POA: Diagnosis not present

## 2023-10-22 DIAGNOSIS — G4733 Obstructive sleep apnea (adult) (pediatric): Secondary | ICD-10-CM

## 2023-10-22 DIAGNOSIS — U071 COVID-19: Secondary | ICD-10-CM

## 2023-10-22 DIAGNOSIS — Z6841 Body Mass Index (BMI) 40.0 and over, adult: Secondary | ICD-10-CM

## 2023-10-22 NOTE — Progress Notes (Unsigned)
 @Patient  ID: Rebecca Hicks, female    DOB: 1946-01-03, 78 y.o.   MRN: [id number]  Chief Complaint  Patient presents with   Follow-up    Cpap is uncomfortable, needs other alternative     Referring provider: Madelon Donald HERO, DO  HPI: 78 year old female, former smoker referred for sleep consult.  She is followed by Dr. Shelah for sarcoidosis.  Past medical history significant for hypertension, OSA, GERD, diabetes, retinopathy, CKD, HLD.  TEST/EVENTS:  10/21/2015 NPSG: AHI 31.5/h, SpO2 low 80%; weight 294 lb 08/02/2016 CPAP titration >> trial of CPAP 10 cmH2O and 3 L/min supplemental O2 bled through; weight 294 pounds  07/18/2023: Today - sleep consult Discussed the use of AI scribe software for clinical note transcription with the patient, who gave verbal consent to proceed.  History of Present Illness         Goes to bed between 11 PM and 2 AM.  Typically falls asleep within 30 minutes to an hour.  Wakes 2-3 times a night.  Gets up around 10 AM to 12 PM.    Lives alone  [address] [address]  [phone]  Allergies  Allergen Reactions   Penicillins Rash and Other (See Comments)    Has patient had a PCN reaction causing immediate rash, facial/tongue/throat swelling, SOB or lightheadedness with hypotension: Yes Has patient had a PCN reaction causing severe rash involving mucus membranes or skin necrosis: No Has patient had a PCN reaction that required hospitalization: No Has patient had a PCN reaction occurring within the last 10 years: Yes If all of the above answers are NO, then may proceed with Cephalosporin use.     Immunization History  Administered Date(s) Administered   Fluad Quad(high Dose 65+) 02/19/2019, 03/23/2021, 04/10/2022   Influenza Split 02/13/2011, 03/12/2012   Influenza Whole 04/03/2007, 02/23/2008, 02/25/2009   Influenza, High Dose Seasonal PF 02/26/2018   Influenza,inj,Quad PF,6+ Mos 03/04/2013, 04/07/2014, 02/15/2015, 12/15/2015, 03/13/2017, 04/05/2020    PFIZER(Purple Top)SARS-COV-2 Vaccination 06/13/2019, 07/07/2019, 04/05/2020   Pfizer Covid-19 Vaccine Bivalent Booster 106yrs & up 03/23/2021   Pfizer(Comirnaty)Fall Seasonal Vaccine 12 years and older 09/19/2022   Pneumococcal Conjugate-13 04/07/2014   Pneumococcal Polysaccharide-23 03/12/2012   Td 02/25/2009   Tdap 03/26/2022    Past Medical History:  Diagnosis Date   Abdominal pain 12/05/2011   Abdominal pain, left upper quadrant 12/03/2012   Acute on chronic renal failure (HCC) 12/25/2012   Allergy    SEASONAL   Arthritis    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  Watch for overdiuresis.    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 - She was fitted with a Small size [employer] Full Face Mask Simplus mask and heated humidification.     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, POSITIVE 02/07/2007   Annotation: with clear CXR mid 1990's Qualifier: History of  By: Vicci RN, Erika     TOBACCO USE, QUIT 02/07/2007   Qualifier: Diagnosis of  By: Scarlet MD, William     Tremor of both hands 12/15/2015   U R I 03/15/2010   Qualifier: Diagnosis of  By: Scarlet MD, Elsie      Tobacco History: Social History   Tobacco Use  Smoking Status Former   Current packs/day: 0.00   Average packs/day: 1 pack/day for 20.0 years (20.0 ttl pk-yrs)   Types: Cigarettes   Start date: 04/24/1975   Quit date: 04/24/1995   Years since quitting: 28.5   Passive exposure: Never  Smokeless Tobacco Never   Counseling given: Not Answered   Outpatient Medications Prior to Visit  Medication Sig Dispense Refill   acetaminophen  (TYLENOL ) 650 MG CR tablet Take 650 mg by mouth every 8 (eight) hours as needed.      albuterol  (PROAIR  HFA) 108 (90 Base) MCG/ACT inhaler Inhale 2 puffs into the lungs every 6 (six) hours as needed for wheezing or shortness of breath. 1 each 2   amLODipine  (NORVASC ) 5 MG tablet Take 1 tablet (5 mg total) by mouth daily. 90 tablet 3   aspirin 81 MG tablet Take 81 mg by mouth daily.     Bempedoic Acid-Ezetimibe  (NEXLIZET )  180-10 MG TABS Take 1 tablet by mouth daily. 90 tablet 2   Continuous Blood Gluc Receiver (DEXCOM G6 RECEIVER) DEVI 1 Device by Does not apply route as directed. Use with Dexcom G6 sensor and transmitter 1 each 1   Continuous Glucose Sensor (DEXCOM G7 SENSOR) MISC Apply 1 sensor every 10 days. 3 each 11   DULoxetine  (CYMBALTA ) 60 MG capsule Take 1 capsule (60 mg total) by mouth daily. 90 capsule 3   empagliflozin  (JARDIANCE ) 25 MG TABS tablet Take 1 tablet (25 mg total) by mouth daily. 90 tablet 3   glucose blood (ACCU-CHEK SMARTVIEW) test strip Use as instructed 100 each 12   insulin  aspart (NOVOLOG  FLEXPEN) 100 UNIT/ML FlexPen Inject 12 Units into the skin 2 (two) times daily with a meal. 15 mL 11   insulin   degludec (TRESIBA  FLEXTOUCH) 100 UNIT/ML FlexTouch Pen Inject 25 Units into the skin daily. 15 mL 0   Insulin  Pen Needle (B-D ULTRAFINE III SHORT PEN) 31G X 8 MM MISC USE TO INJECT INSULIN  4 times daily. 360 each 3   losartan -hydrochlorothiazide  (HYZAAR) 100-12.5 MG tablet Take 1 tablet by mouth daily. 90 tablet 3   metFORMIN  (GLUCOPHAGE -XR) 500 MG 24 hr tablet Take 2 tablets (1,000 mg total) by mouth daily. 270 tablet 3   metoprolol  succinate (TOPROL -XL) 50 MG 24 hr tablet Take 1 tablet (50 mg total) by mouth daily. Take with or immediately following a meal. 90 tablet 3   nitroGLYCERIN  (NITROSTAT ) 0.4 MG SL tablet Place 1 tablet (0.4 mg total) under the tongue every 5 (five) minutes as needed for chest pain. 25 tablet 3   omeprazole  (PRILOSEC) 40 MG capsule Take 1 capsule (40 mg total) by mouth daily as needed. 90 capsule 3   rosuvastatin  (CRESTOR ) 20 MG tablet Take 1 tablet (20 mg total) by mouth daily. 90 tablet 3   Semaglutide , 2 MG/DOSE, (OZEMPIC , 2 MG/DOSE,) 8 MG/3ML SOPN Inject 2 mg into the skin once a week.     spironolactone  (ALDACTONE ) 25 MG tablet Take 1 tablet (25 mg total) by mouth daily. 90 tablet 3   timolol (TIMOPTIC) 0.5 % ophthalmic solution Place 1 drop into both eyes 2  times daily.     Accu-Chek Softclix Lancets lancets Use as instructed (Patient not taking: Reported on 10/22/2023) 100 each 12   No facility-administered medications prior to visit.     Review of Systems:   Constitutional: No night sweats, fevers, chills,or lassitude. +fatigue, weight loss HEENT: No headaches, difficulty swallowing, tooth/dental problems, or sore throat. No sneezing, itching, ear ache, nasal congestion, or post nasal drip CV:  No chest pain, orthopnea, PND, swelling in lower extremities, anasarca, dizziness, palpitations, syncope Resp: +hx of snoring; chronic dry cough. No shortness of breath with exertion. No excess mucus or change in color of mucus. No productive or non-productive. No hemoptysis. No wheezing.  No chest wall deformity GI:  No heartburn, indigestion, abdominal pain, nausea, vomiting, diarrhea, change in bowel habits, loss of appetite, bloody stools.  GU: +nocturia  Skin: No rash, lesions, ulcerations MSK:  +chronic joint pains Neuro: No dizziness or lightheadedness.  Psych: No depression or anxiety. Mood stable. +sleep disturbance     Physical Exam:  Ht 5' 5 (1.651 m)   Wt 248 lb (112.5 kg)   BMI 41.27 kg/m   GEN: Pleasant, interactive, well-kempt; obese; in no acute distress HEENT:  Normocephalic and atraumatic. PERRLA. Sclera white. Nasal turbinates pink, moist and patent bilaterally. No rhinorrhea present. Oropharynx pink and moist, without exudate or edema. No lesions, ulcerations, or postnasal drip. Mallampati II/III NECK:  Supple w/ fair ROM. Thyroid  symmetrical with no goiter or nodules palpated. No lymphadenopathy.   CV: RRR, no m/r/g, no peripheral edema. Pulses intact, +2 bilaterally. No cyanosis, pallor or clubbing. PULMONARY:  Unlabored, regular breathing. Clear bilaterally A&P w/o wheezes/rales/rhonchi. No accessory muscle use.  GI: BS present and normoactive. Soft, non-tender to palpation. No organomegaly or masses detected.  MSK: No  erythema, warmth or tenderness. Cap refil <2 sec all extrem. No deformities or joint swelling noted.  Neuro: A/Ox3. No focal deficits noted.   Skin: Warm, no lesions or rashe Psych: Normal affect and behavior. Judgement and thought content appropriate.     Lab Results:  CBC    Component Value Date/Time   WBC 4.7 07/05/2023 1657  WBC 6.7 09/23/2017 1622   RBC 4.45 07/05/2023 1657   RBC 4.86 09/23/2017 1622   HGB 12.9 07/05/2023 1657   HCT 39.6 07/05/2023 1657   PLT 182 07/05/2023 1657   MCV 89 07/05/2023 1657   MCH 29.0 07/05/2023 1657   MCH 27.4 09/23/2017 1622   MCHC 32.6 07/05/2023 1657   MCHC 30.6 09/23/2017 1622   RDW 14.1 07/05/2023 1657   LYMPHSABS 1.6 09/23/2017 1622   MONOABS 0.7 09/23/2017 1622   EOSABS 0.2 09/23/2017 1622   BASOSABS 0.1 09/23/2017 1622    BMET    Component Value Date/Time   NA 139 07/05/2023 1657   K 5.7 (H) 07/05/2023 1657   CL 102 07/05/2023 1657   CO2 23 07/05/2023 1657   GLUCOSE 157 (H) 07/05/2023 1657   GLUCOSE 171 (H) 01/04/2021 1140   BUN 27 07/05/2023 1657   CREATININE 1.55 (H) 07/05/2023 1657   CREATININE 1.43 (H) 02/28/2016 1553   CALCIUM  9.3 07/05/2023 1657   GFRNONAA 37 (L) 05/31/2020 1346   GFRNONAA 38 (L) 12/24/2012 1624   GFRAA 43 (L) 05/31/2020 1346   GFRAA 43 (L) 12/24/2012 1624    BNP    Component Value Date/Time   BNP 12.9 11/27/2021 1434   BNP 23.7 02/28/2016 1553     Imaging:  No results found.  Administration History     None          Latest Ref Rng & Units 06/25/2022    3:19 PM 12/30/2019   11:52 AM  PFT Results  FVC-Pre L 2.62  3.07   FVC-Predicted Pre % 91  128   FVC-Post L 2.72  3.12   FVC-Predicted Post % 94  130   Pre FEV1/FVC % % 85  86   Post FEV1/FCV % % 87  88   FEV1-Pre L 2.24  2.65   FEV1-Predicted Pre % 103  143   FEV1-Post L 2.38  2.73   DLCO uncorrected ml/min/mmHg 17.03  18.31   DLCO UNC% % 87  91   DLCO corrected ml/min/mmHg 17.03  18.31   DLCO COR %Predicted % 87  91    DLVA Predicted % 99  91   TLC L 5.19  6.19   TLC % Predicted % 101  118   RV % Predicted % 83  109     No results found for: NITRICOXIDE      Assessment & Plan:   No problem-specific Assessment & Plan notes found for this encounter.    Advised if symptoms do not improve or worsen, to please contact office for sooner follow up or seek emergency care.   I spent 35 minutes of dedicated to the care of this patient on the date of this encounter to include pre-visit review of records, face-to-face time with the patient discussing conditions above, post visit ordering of testing, clinical documentation with the electronic health record, making appropriate referrals as documented, and communicating necessary findings to members of the patients care team.  Comer LULLA Rouleau, NP 10/22/2023  Pt aware and understands NP's role.

## 2023-10-24 ENCOUNTER — Encounter: Payer: Self-pay | Admitting: Nurse Practitioner

## 2023-10-24 DIAGNOSIS — U071 COVID-19: Secondary | ICD-10-CM

## 2023-10-24 HISTORY — DX: COVID-19: U07.1

## 2023-10-24 NOTE — Patient Instructions (Signed)
 Restart CPAP every night, minimum of 4-6 hours a night.  Change equipment as directed. Wash your tubing with warm soap and water daily, hang to dry. Wash humidifier portion weekly. Use bottled, distilled water and change daily Be aware of reduced alertness and do not drive or operate heavy machinery if experiencing this or drowsiness.  Exercise encouraged, as tolerated. Healthy weight management discussed.  Avoid or decrease alcohol consumption and medications that make you more sleepy, if possible. Notify if persistent daytime sleepiness occurs even with consistent use of PAP therapy.  Change CPAP supplies... Every month Mask cushions and/or nasal pillows CPAP machine filters Every 3 months Mask frame (not including the headgear) CPAP tubing Every 6 months Mask headgear Chin strap (if applicable) Humidifier water tub  I adjusted your CPAP settings. Call me if you're still uncomfortable.   Let us  know if you feel worse as you're recovering from COVID. Glad things are improving  Continue Albuterol  inhaler 2 puffs every 6 hours as needed for shortness of breath or wheezing. Notify if symptoms persist despite rescue inhaler/neb use.   Follow up in 6 weeks with Katie Eryx Zane,NP to see how resuming CPAP is going. If symptoms do not improve or worsen, please contact office for sooner follow up or seek emergency care.

## 2023-10-24 NOTE — Assessment & Plan Note (Signed)
 Severe OSA. She is not currently using her CPAP due to issues with pressure settings. Will adjust her to auto setting 5-10 cmH2O and reassess response. Advised her to resume therapy once recovered from COVID and sinus symptoms improved. Re-educated on risks of untreated severe OSA. Reviewed treatment options. She is not a candidate for hypoglossal nerve stimulator due to BMI >40 and oral appliance would not be an effective treatment option. Healthy weight management encouraged. Safe driving practices reviewed.  Patient Instructions  Restart CPAP every night, minimum of 4-6 hours a night.  Change equipment as directed. Wash your tubing with warm soap and water daily, hang to dry. Wash humidifier portion weekly. Use bottled, distilled water and change daily Be aware of reduced alertness and do not drive or operate heavy machinery if experiencing this or drowsiness.  Exercise encouraged, as tolerated. Healthy weight management discussed.  Avoid or decrease alcohol consumption and medications that make you more sleepy, if possible. Notify if persistent daytime sleepiness occurs even with consistent use of PAP therapy.  Change CPAP supplies... Every month Mask cushions and/or nasal pillows CPAP machine filters Every 3 months Mask frame (not including the headgear) CPAP tubing Every 6 months Mask headgear Chin strap (if applicable) Humidifier water tub  I adjusted your CPAP settings. Call me if you're still uncomfortable.   Let us  know if you feel worse as you're recovering from COVID. Glad things are improving  Continue Albuterol  inhaler 2 puffs every 6 hours as needed for shortness of breath or wheezing. Notify if symptoms persist despite rescue inhaler/neb use.   Follow up in 6 weeks with Katie Helene Bernstein,NP to see how resuming CPAP is going. If symptoms do not improve or worsen, please contact office for sooner follow up or seek emergency care.

## 2023-10-24 NOTE — Assessment & Plan Note (Signed)
Stable. Follow up with Dr. Delton Coombes as scheduled.

## 2023-10-24 NOTE — Assessment & Plan Note (Signed)
 Healthy weight management reviewed.

## 2023-10-24 NOTE — Assessment & Plan Note (Signed)
 Clinically improving. Supportive care advised

## 2023-10-24 NOTE — Progress Notes (Addendum)
 Patient ID: Rebecca Hicks, female     DOB: October 08, 1945, 78 y.o.      MRN: [id number]  Chief Complaint  Patient presents with   Follow-up    Cpap is uncomfortable, needs other alternative     Virtual Visit via Video Note  I connected with Rebecca Hicks on 10/24/23 at 11:30 AM EDT by a video enabled telemedicine application and verified that I am speaking with the correct person using two identifiers.  Location: Patient: Home Provider: Office   I discussed the limitations of evaluation and management by telemedicine and the availability of in person appointments. The patient expressed understanding and agreed to proceed.  History of Present Illness: 78 year old female, former smoker followed for sleep apnea.  She is followed by Dr. Shelah for sarcoidosis.  Past medical history significant for hypertension, OSA, GERD, diabetes, retinopathy, CKD, HLD.  TEST/EVENTS:  10/21/2015 NPSG: AHI 31.5/h, SpO2 low 80%; weight 294 lb 08/02/2016 CPAP titration >> trial of CPAP 10 cmH2O and 3 L/min supplemental O2 bled through; weight 294 pounds 08/05/2023 HST: AHI 37.2/h, SpO2 low 81%  07/18/2023: OV with Donnette Macmullen NP - sleep consult Discussed the use of AI scribe software for clinical note transcription with the patient, who gave verbal consent to proceed. History of Present Illness   Rebecca Hicks is a 78 year old female with severe sleep apnea who presents with difficulty using CPAP therapy. She was diagnosed with severe sleep apnea in 2017 and started on CPAP therapy. A titration study in September 2018 indicated a requirement of 10 cm of water pressure. Initially, she used the CPAP consistently but began experiencing issues with the machine, leading to a replacement. The new machine worked well for a couple of years. Approximately a year ago, she developed bronchitis and began experiencing symptoms such as waking up with mucus, coughing, and a burning sensation when using the CPAP. Adjustments  to the CPAP pressure provided temporary relief, but symptoms recurred, leading her to discontinue its use. She has not used the CPAP since then. Her weight has decreased by about 50 pounds since her original sleep study in 2017, which may have affected her CPAP pressure needs. She consumes three cups of coffee daily and occasionally drinks alcohol at social events. She does not take any sleep medications. She feels tired frequently but does not have trouble falling asleep. She experiences nocturia, waking two to three times per night to urinate, which is often prompted by changing positions in bed. No snoring, morning headaches, drowsy driving, or sleepwalking.  Goes to bed between 11 PM and 2 AM.  Typically falls asleep within 30 minutes to an hour.  Wakes 2-3 times a night.  Gets up around 10 AM to 12 PM.   Lives alone [place] [address]   10/22/2023: Today - follow up Patient presents today for follow up. She had repeat sleep study after her last visit, revealing severe OSA. She was instructed to resume CPAP. She's tried to restart but has difficulties tolerating her CPAP. She finds the pressures uncomfortable since her weight loss of 50 lb. She does still feel tired. No issues with sleep but has some night awakenings due to having to use the restroom. No issues with drowsy driving.  She had COVID recently. Finally recovering. Feeling better. Breathing is doing okay. No fevers, chills, anorexia. Eating and drinking well.      Serial number to current CPAP: [id number]        Allergies  Allergen Reactions  Penicillins Rash and Other (See Comments)    Has patient had a PCN reaction causing immediate rash, facial/tongue/throat swelling, SOB or lightheadedness with hypotension: Yes Has patient had a PCN reaction causing severe rash involving mucus membranes or skin necrosis: No Has patient had a PCN reaction that required hospitalization: No Has patient had a PCN reaction occurring within the last 10  years: Yes If all of the above answers are NO, then may proceed with Cephalosporin use.    Immunization History  Administered Date(s) Administered   Fluad Quad(high Dose 65+) 02/19/2019, 03/23/2021, 04/10/2022   Influenza Split 02/13/2011, 03/12/2012   Influenza Whole 04/03/2007, 02/23/2008, 02/25/2009   Influenza, High Dose Seasonal PF 02/26/2018   Influenza,inj,Quad PF,6+ Mos 03/04/2013, 04/07/2014, 02/15/2015, 12/15/2015, 03/13/2017, 04/05/2020   PFIZER(Purple Top)SARS-COV-2 Vaccination 06/13/2019, 07/07/2019, 04/05/2020   Pfizer Covid-19 Vaccine Bivalent Booster 62yrs & up 03/23/2021   Pfizer(Comirnaty)Fall Seasonal Vaccine 12 years and older 09/19/2022   Pneumococcal Conjugate-13 04/07/2014   Pneumococcal Polysaccharide-23 03/12/2012   Td 02/25/2009   Tdap 03/26/2022   Past Medical History:  Diagnosis Date   Abdominal pain 12/05/2011   Abdominal pain, left upper quadrant 12/03/2012   Acute on chronic renal failure (HCC) 12/25/2012   Allergy    SEASONAL   Arthritis    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  Watch for overdiuresis.    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 - She was fitted with  a Small size [employer] Full Face Mask Simplus mask and heated humidification.     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, POSITIVE 02/07/2007   Annotation: with clear CXR mid 1990's Qualifier: History of  By: Vicci RN, Erika     TOBACCO USE, QUIT 02/07/2007   Qualifier: Diagnosis of  By: Scarlet MD, William     Tremor of both hands 12/15/2015   U R I 03/15/2010   Qualifier: Diagnosis of  By: Scarlet MD, Elsie      Tobacco History: Social History   Tobacco Use  Smoking Status Former   Current packs/day: 0.00   Average packs/day: 1 pack/day for 20.0 years (20.0 ttl pk-yrs)   Types: Cigarettes   Start date: 04/24/1975   Quit date: 04/24/1995   Years since quitting: 28.5   Passive exposure: Never  Smokeless Tobacco Never   Counseling given: Not Answered   Outpatient Medications Prior to Visit  Medication Sig  Dispense Refill   acetaminophen  (TYLENOL ) 650 MG CR tablet Take 650 mg by mouth every 8 (eight) hours as needed.      albuterol  (PROAIR  HFA) 108 (90 Base) MCG/ACT inhaler Inhale 2 puffs into the lungs every 6 (six) hours as needed for wheezing or shortness of breath. 1 each 2   amLODipine  (NORVASC ) 5 MG tablet Take 1 tablet (5 mg total) by mouth daily. 90 tablet 3   aspirin 81 MG tablet Take 81 mg by mouth daily.     Bempedoic Acid-Ezetimibe  (NEXLIZET ) 180-10 MG TABS Take 1 tablet by mouth daily. 90 tablet 2   Continuous Blood Gluc Receiver (DEXCOM G6 RECEIVER) DEVI 1 Device by Does not apply route as directed. Use with Dexcom G6 sensor and transmitter 1 each 1   Continuous Glucose Sensor (DEXCOM G7 SENSOR) MISC Apply 1 sensor every 10 days. 3 each 11   DULoxetine  (CYMBALTA ) 60 MG capsule Take 1 capsule (60 mg total) by mouth daily. 90 capsule 3   empagliflozin  (JARDIANCE ) 25 MG TABS tablet Take 1 tablet (25 mg total) by mouth daily. 90 tablet 3   glucose blood (ACCU-CHEK SMARTVIEW) test strip Use as instructed 100 each 12   insulin  aspart (NOVOLOG  FLEXPEN) 100 UNIT/ML FlexPen Inject 12 Units into the skin 2 (two) times daily with a meal. 15 mL 11   insulin  degludec (TRESIBA  FLEXTOUCH) 100 UNIT/ML FlexTouch Pen Inject 25 Units into the skin daily. 15 mL 0   Insulin  Pen Needle (B-D ULTRAFINE III SHORT PEN) 31G X 8 MM MISC USE TO INJECT INSULIN  4 times daily. 360 each 3   losartan -hydrochlorothiazide  (HYZAAR) 100-12.5 MG tablet Take 1 tablet by mouth daily. 90 tablet 3   metFORMIN  (GLUCOPHAGE -XR) 500 MG 24 hr tablet Take 2 tablets (1,000 mg total) by mouth daily. 270 tablet 3   metoprolol  succinate (TOPROL -XL) 50 MG 24 hr tablet Take 1 tablet (50 mg total) by mouth daily. Take with or immediately following a meal. 90 tablet 3   nitroGLYCERIN  (NITROSTAT ) 0.4 MG SL tablet Place 1 tablet (0.4 mg total) under the tongue every 5 (five) minutes as needed for chest pain. 25 tablet 3   omeprazole  (PRILOSEC)  40 MG capsule Take 1 capsule (40 mg total) by mouth daily as needed. 90 capsule 3   rosuvastatin  (CRESTOR ) 20 MG tablet Take 1 tablet (20 mg total) by mouth daily. 90 tablet 3   Semaglutide , 2 MG/DOSE, (OZEMPIC , 2 MG/DOSE,) 8 MG/3ML SOPN Inject 2 mg into the skin once a week.     spironolactone  (  ALDACTONE ) 25 MG tablet Take 1 tablet (25 mg total) by mouth daily. 90 tablet 3   timolol (TIMOPTIC) 0.5 % ophthalmic solution Place 1 drop into both eyes 2 times daily.     Accu-Chek Softclix Lancets lancets Use as instructed (Patient not taking: Reported on 10/22/2023) 100 each 12   No facility-administered medications prior to visit.     Review of Systems:   Constitutional: No night sweats, fevers, chills,or lassitude. +fatigue, weight loss (intentional; stable) HEENT: No headaches, difficulty swallowing, tooth/dental problems, or sore throat. No sneezing, itching, ear ache, +improving nasal congestion, post nasal drip CV:  No chest pain, orthopnea, PND, swelling in lower extremities, anasarca, dizziness, palpitations, syncope Resp: +hx of snoring; chronic dry cough. No shortness of breath with exertion. No excess mucus or change in color of mucus. No productive or non-productive. No hemoptysis. No wheezing.  No chest wall deformity GI:  No heartburn, indigestion, abdominal pain, nausea, vomiting, diarrhea, change in bowel habits, loss of appetite, bloody stools.  GU: +nocturia  Skin: No rash, lesions, ulcerations MSK:  +chronic joint pains Neuro: No dizziness or lightheadedness.  Psych: No depression or anxiety. Mood stable. +sleep disturbance  Observations/Objective: Patient is well-developed, well-nourished in no acute distress.  Resting comfortably at home.  No labored breathing.  Speech is clear and coherent with logical content.  Patient is alert and oriented at baseline.   Assessment and Plan: Obstructive sleep apnea Severe OSA. She is not currently using her CPAP due to issues with  pressure settings. Will adjust her to auto setting 5-10 cmH2O and reassess response. Advised her to resume therapy once recovered from COVID and sinus symptoms improved. Re-educated on risks of untreated severe OSA. Reviewed treatment options. She is not a candidate for hypoglossal nerve stimulator due to BMI >40 and oral appliance would not be an effective treatment option. Healthy weight management encouraged. Safe driving practices reviewed.  Patient Instructions  Restart CPAP every night, minimum of 4-6 hours a night.  Change equipment as directed. Wash your tubing with warm soap and water daily, hang to dry. Wash humidifier portion weekly. Use bottled, distilled water and change daily Be aware of reduced alertness and do not drive or operate heavy machinery if experiencing this or drowsiness.  Exercise encouraged, as tolerated. Healthy weight management discussed.  Avoid or decrease alcohol consumption and medications that make you more sleepy, if possible. Notify if persistent daytime sleepiness occurs even with consistent use of PAP therapy.  Change CPAP supplies... Every month Mask cushions and/or nasal pillows CPAP machine filters Every 3 months Mask frame (not including the headgear) CPAP tubing Every 6 months Mask headgear Chin strap (if applicable) Humidifier water tub  I adjusted your CPAP settings. Call me if you're still uncomfortable.   Let us  know if you feel worse as you're recovering from COVID. Glad things are improving  Continue Albuterol  inhaler 2 puffs every 6 hours as needed for shortness of breath or wheezing. Notify if symptoms persist despite rescue inhaler/neb use.   Follow up in 6 weeks with Katie Karolee Meloni,NP to see how resuming CPAP is going. If symptoms do not improve or worsen, please contact office for sooner follow up or seek emergency care.    COVID-19 virus infection Clinically improving. Supportive care advised   Morbid obesity (HCC) Healthy weight  management reviewed.   SARCOIDOSIS, PULMONARY Stable. Follow up with Dr. Shelah as scheduled     I discussed the assessment and treatment plan with the patient. The patient was  provided an opportunity to ask questions and all were answered. The patient agreed with the plan and demonstrated an understanding of the instructions.   The patient was advised to call back or seek an in-person evaluation if the symptoms worsen or if the condition fails to improve as anticipated.  I provided 35 minutes of non-face-to-face time during this encounter.   Comer LULLA Rouleau, NP

## 2023-11-01 DIAGNOSIS — Z961 Presence of intraocular lens: Secondary | ICD-10-CM | POA: Diagnosis not present

## 2023-11-01 DIAGNOSIS — H35342 Macular cyst, hole, or pseudohole, left eye: Secondary | ICD-10-CM | POA: Diagnosis not present

## 2023-11-01 DIAGNOSIS — Z9889 Other specified postprocedural states: Secondary | ICD-10-CM | POA: Diagnosis not present

## 2023-11-01 DIAGNOSIS — E113313 Type 2 diabetes mellitus with moderate nonproliferative diabetic retinopathy with macular edema, bilateral: Secondary | ICD-10-CM | POA: Diagnosis not present

## 2023-11-05 DIAGNOSIS — E1151 Type 2 diabetes mellitus with diabetic peripheral angiopathy without gangrene: Secondary | ICD-10-CM | POA: Diagnosis not present

## 2023-11-05 DIAGNOSIS — L84 Corns and callosities: Secondary | ICD-10-CM | POA: Diagnosis not present

## 2023-11-05 DIAGNOSIS — S92405A Nondisplaced unspecified fracture of left great toe, initial encounter for closed fracture: Secondary | ICD-10-CM | POA: Diagnosis not present

## 2023-11-05 DIAGNOSIS — M19071 Primary osteoarthritis, right ankle and foot: Secondary | ICD-10-CM | POA: Diagnosis not present

## 2023-11-19 ENCOUNTER — Telehealth: Payer: Self-pay

## 2023-11-19 ENCOUNTER — Telehealth: Payer: Self-pay | Admitting: Family Medicine

## 2023-11-19 NOTE — Telephone Encounter (Signed)
 Informed patient her novo nordisk shipment is ready for pickup.  4 boxes Ozempic  1mg  dose pens (4 month supply) 2 boxes Tresiba  U100 (4 month supply) 3 boxes Novolog  Flexpens (6 month supply)

## 2023-11-19 NOTE — Telephone Encounter (Signed)
 Patient was identified as falling into the [place] Measure - Diabetes.   Patient was: Appointment scheduled with primary care provider in the next 30 days.

## 2023-11-21 NOTE — Telephone Encounter (Signed)
 Patient's sister presents to clinic to pick up medication shipment.   Provided medications to sister per note from [facility].   Chiquita JAYSON English, RN

## 2023-12-02 ENCOUNTER — Ambulatory Visit: Admitting: Family Medicine

## 2023-12-04 DIAGNOSIS — N1832 Chronic kidney disease, stage 3b: Secondary | ICD-10-CM | POA: Diagnosis not present

## 2023-12-04 LAB — LAB REPORT - SCANNED: EGFR: 33

## 2023-12-05 LAB — LAB REPORT - SCANNED: EGFR: 33

## 2023-12-10 DIAGNOSIS — E559 Vitamin D deficiency, unspecified: Secondary | ICD-10-CM | POA: Diagnosis not present

## 2023-12-10 DIAGNOSIS — D631 Anemia in chronic kidney disease: Secondary | ICD-10-CM | POA: Diagnosis not present

## 2023-12-10 DIAGNOSIS — N1832 Chronic kidney disease, stage 3b: Secondary | ICD-10-CM | POA: Diagnosis not present

## 2023-12-10 DIAGNOSIS — N2581 Secondary hyperparathyroidism of renal origin: Secondary | ICD-10-CM | POA: Diagnosis not present

## 2023-12-10 DIAGNOSIS — E1122 Type 2 diabetes mellitus with diabetic chronic kidney disease: Secondary | ICD-10-CM | POA: Diagnosis not present

## 2023-12-10 DIAGNOSIS — I129 Hypertensive chronic kidney disease with stage 1 through stage 4 chronic kidney disease, or unspecified chronic kidney disease: Secondary | ICD-10-CM | POA: Diagnosis not present

## 2023-12-13 ENCOUNTER — Ambulatory Visit: Admitting: Family Medicine

## 2023-12-17 DIAGNOSIS — D8689 Sarcoidosis of other sites: Secondary | ICD-10-CM | POA: Diagnosis not present

## 2023-12-17 DIAGNOSIS — H35342 Macular cyst, hole, or pseudohole, left eye: Secondary | ICD-10-CM | POA: Diagnosis not present

## 2023-12-17 DIAGNOSIS — E113313 Type 2 diabetes mellitus with moderate nonproliferative diabetic retinopathy with macular edema, bilateral: Secondary | ICD-10-CM | POA: Diagnosis not present

## 2023-12-17 DIAGNOSIS — Z961 Presence of intraocular lens: Secondary | ICD-10-CM | POA: Diagnosis not present

## 2023-12-17 DIAGNOSIS — E11311 Type 2 diabetes mellitus with unspecified diabetic retinopathy with macular edema: Secondary | ICD-10-CM | POA: Diagnosis not present

## 2023-12-20 ENCOUNTER — Other Ambulatory Visit: Payer: Self-pay | Admitting: Family Medicine

## 2023-12-20 ENCOUNTER — Ambulatory Visit (INDEPENDENT_AMBULATORY_CARE_PROVIDER_SITE_OTHER): Admitting: Family Medicine

## 2023-12-20 ENCOUNTER — Encounter: Payer: Self-pay | Admitting: Family Medicine

## 2023-12-20 VITALS — BP 150/88 | HR 72 | Ht 65.0 in | Wt 250.2 lb

## 2023-12-20 DIAGNOSIS — I2510 Atherosclerotic heart disease of native coronary artery without angina pectoris: Secondary | ICD-10-CM

## 2023-12-20 DIAGNOSIS — N183 Chronic kidney disease, stage 3 unspecified: Secondary | ICD-10-CM

## 2023-12-20 DIAGNOSIS — E1122 Type 2 diabetes mellitus with diabetic chronic kidney disease: Secondary | ICD-10-CM | POA: Diagnosis not present

## 2023-12-20 DIAGNOSIS — N1832 Chronic kidney disease, stage 3b: Secondary | ICD-10-CM

## 2023-12-20 DIAGNOSIS — G4733 Obstructive sleep apnea (adult) (pediatric): Secondary | ICD-10-CM

## 2023-12-20 DIAGNOSIS — H6121 Impacted cerumen, right ear: Secondary | ICD-10-CM

## 2023-12-20 DIAGNOSIS — I10 Essential (primary) hypertension: Secondary | ICD-10-CM | POA: Diagnosis not present

## 2023-12-20 DIAGNOSIS — K219 Gastro-esophageal reflux disease without esophagitis: Secondary | ICD-10-CM | POA: Diagnosis not present

## 2023-12-20 LAB — POCT GLYCOSYLATED HEMOGLOBIN (HGB A1C): HbA1c, POC (controlled diabetic range): 8.1 % — AB (ref 0.0–7.0)

## 2023-12-20 NOTE — Assessment & Plan Note (Signed)
 Some improvement with irrigation. Instructed on at home debrox, will reassess at follow up. Had prior audiology testing normal.

## 2023-12-20 NOTE — Assessment & Plan Note (Signed)
 Continue PPI. Discussed dose decrease of ozempic , declines.

## 2023-12-20 NOTE — Assessment & Plan Note (Signed)
 At goal, no changes. Foot exam UTD. Get UACR, BMP today. Recommend PCV20 at pharmacy.

## 2023-12-20 NOTE — Assessment & Plan Note (Addendum)
 Likely contributing to fatigue, currently noncompliant with CPAP. Encouraged trying new CPAP settings. Due for pulm f/u, encouraged appt.

## 2023-12-20 NOTE — Assessment & Plan Note (Signed)
 Elevated a bit today but historically at goal. No changes today, will monitor at home. F/u 2 months.

## 2023-12-20 NOTE — Patient Instructions (Addendum)
 It was great to see you!  Our plans for today:  - See below for tips with eating with diabetes.  - No changes to your medications.  - Consider getting your shinges, pneumonia, flu vaccines at your pharmacy. - Make an appointment with Pulmonology for your CPAP. Consider giving this another try.  - Monitor your blood pressure at home and keep a log of your readings. Make sure to be seated for at least 5 minutes prior to testing and not in pain or worked up for the most accurate readings. Bring this log with you to follow up.   We are checking some labs today, we will release these results to your MyChart.  Take care and seek immediate care sooner if you develop any concerns.   Dr. Madelon    Diet Recommendations for Diabetes   1. Eat at least 3 meals and 1-2 snacks per day. Never go more than 4-5 hours while awake without eating. Eat breakfast within the first hour of getting up.   2. Limit starchy foods to TWO per meal and ONE per snack. ONE portion of a starchy  food is equal to the following:   - ONE slice of bread (or its equivalent, such as half of a hamburger bun).   - 1/2 cup of a scoopable starchy food such as potatoes or rice.   - 15 grams of Total Carbohydrate as shown on food label.  3. Include at every meal: a protein food, a carb food, and vegetables and/or fruit.   - Obtain twice the volume of vegetables as protein or carbohydrate foods for both lunch and dinner.   - Fresh or frozen vegetables are best.   - Keep frozen vegetables on hand for a quick vegetable serving.       Starchy (carb) foods: Bread, rice, pasta, potatoes, corn, cereal, grits, crackers, bagels, muffins, all baked goods.  (Fruits, milk, and yogurt also have carbohydrate, but most of these foods will not spike your blood sugar as most starchy foods will.)  A few fruits do cause high blood sugars; use small portions of bananas (limit to 1/2 at a time), grapes, watermelon, oranges, and most tropical fruits.     Protein foods: Meat, fish, poultry, eggs, dairy foods, and beans such as pinto and kidney beans (beans also provide carbohydrate).      Here is an example of what a healthy plate looks like:    ? Make half your plate fruits and vegetables.     ? Focus on whole fruits.     ? Vary your veggies.  ? Make half your grains whole grains. -     ? Look for the word "whole" at the beginning of the ingredients list    ? Some whole-grain ingredients include whole oats, whole-wheat flour,        whole-grain corn, whole-grain brown rice, and whole rye.  ? Move to low-fat and fat-free milk or yogurt.  ? Vary your protein routine. - Meat, fish, poultry (chicken, [place]), eggs, beans (kidney, pinto), dairy.  ? Drink and eat less sodium, saturated fat, and added sugars.

## 2023-12-20 NOTE — Assessment & Plan Note (Signed)
 Recheck labs. Reviewed recent nephro notes, no changes.

## 2023-12-20 NOTE — Assessment & Plan Note (Signed)
 Contributing to DM2, GERD, OSA. Continue ozempic . Counseled on diet, handout provided.

## 2023-12-20 NOTE — Progress Notes (Signed)
   SUBJECTIVE:   CHIEF COMPLAINT / HPI:   Discussed the use of AI scribe software for clinical note transcription with the patient, who gave verbal consent to proceed.  History of Present Illness Rebecca Hicks is a 78 year old female with hypertension and diabetes who presents with fatigue and sleep disturbances.  Fatigue and sleep disturbance - Significant fatigue requiring 8 to 12 hours of sleep nightly - Frequently wakes up around 2 PM - Difficulty returning to sleep if awakening during the night - Fatigue predates COVID-19 infection in June - Sleep apnea present, but CPAP not used due to discomfort and coughing, worsened by COVID-19 - previous Hb and TSH normal - working on weight loss for alternative CPAP options  Weight fluctuation and dietary changes - Recent weight of 250 pounds - Weight fluctuations over the summer - Increased fast food intake while caring for grandchildren during the summer - Currently eating one meal per day due to late waking and lack of hunger - Reduced meat intake due to digestive issues  Gastrointestinal symptoms - Acid reflux and occasional vomiting, associated with Ozempic  use - Vomiting improved after reducing Ozempic  dose from 2 mg to 1.5 mg - prior EGD 2020 with mild gastritis. Thought 2/2 medication use as above. - does not want to stop ozempic  at this time as it is helping with weight.  Diabetes mellitus management and glycemic control - Current medications: Tresiba , novolog , Jardiance , metformin , and Ozempic  (1.5 mg) - Recent A1c of 8.1, improved from 8.9 in March - Dexcom 90-day average blood glucose of 181 mg/dL - GMI of 2.3% - 34% in range, 3% high, 3% very high, <1% lows.  Hypertension management - Current medications: spironolactone , metoprolol , losartan /hydrochlorothiazide  combination - Blood pressure well-controlled over the past year - Elevated blood pressure noted during this visit  Ear fullness - thinks ears are full  of wax, wants checked.     OBJECTIVE:   BP (!) 150/88   Pulse 72   Ht 5' 5 (1.651 m)   Wt 250 lb 3.2 oz (113.5 kg)   SpO2 95%   BMI 41.64 kg/m   Gen: well appearing, in NAD HEENT: L TM visible without bulging, erythema, purulence. R TM unable to visualize due to cerumen impaction. Card: RRR Lungs: CTAB Ext: WWP, no edema  ASSESSMENT/PLAN:   Hypertension Elevated a bit today but historically at goal. No changes today, will monitor at home. F/u 2 months.   OSA (obstructive sleep apnea) Likely contributing to fatigue, currently noncompliant with CPAP. Encouraged trying new CPAP settings. Due for pulm f/u, encouraged appt.  GERD (gastroesophageal reflux disease) Continue PPI. Discussed dose decrease of ozempic , declines.  DM (diabetes mellitus), type 2 with renal complications (HCC) At goal, no changes. Foot exam UTD. Get UACR, BMP today. Recommend PCV20 at pharmacy.   CKD (chronic kidney disease) Recheck labs. Reviewed recent nephro notes, no changes.  Morbid obesity (HCC) Contributing to DM2, GERD, OSA. Continue ozempic . Counseled on diet, handout provided.   Cerumen impaction Some improvement with irrigation. Instructed on at home debrox, will reassess at follow up. Had prior audiology testing normal.    HM - instructed to update flu, shingles, PCV20 vaccines at pharmacy   [name], DO

## 2023-12-21 LAB — BASIC METABOLIC PANEL WITH GFR
BUN/Creatinine Ratio: 18 (ref 12–28)
BUN: 27 mg/dL (ref 8–27)
CO2: 23 mmol/L (ref 20–29)
Calcium: 9.7 mg/dL (ref 8.7–10.3)
Chloride: 99 mmol/L (ref 96–106)
Creatinine, Ser: 1.53 mg/dL — ABNORMAL HIGH (ref 0.57–1.00)
Glucose: 181 mg/dL — ABNORMAL HIGH (ref 70–99)
Potassium: 5 mmol/L (ref 3.5–5.2)
Sodium: 138 mmol/L (ref 134–144)
eGFR: 35 mL/min/1.73 — ABNORMAL LOW (ref >59)

## 2023-12-21 LAB — MICROALBUMIN / CREATININE URINE RATIO
Creatinine, Urine: 120 mg/dL
Microalb/Creat Ratio: 374 mg/g{creat} — ABNORMAL HIGH (ref 0–29)
Microalbumin, Urine: 449.1 ug/mL

## 2023-12-24 ENCOUNTER — Telehealth: Payer: Self-pay | Admitting: Pharmacist

## 2023-12-24 NOTE — Telephone Encounter (Signed)
 Duplicate entered in error

## 2023-12-24 NOTE — Telephone Encounter (Signed)
 Patient contacted for follow-up of medication adherence  Since last contact patient reports still taking Rosuvastatin  20 mg  Total time with patient call and documentation of interaction: 7 minutes.  Follow-up appointment scheduled: 12/26/23

## 2023-12-26 ENCOUNTER — Ambulatory Visit: Payer: Self-pay | Admitting: Family Medicine

## 2023-12-26 ENCOUNTER — Encounter: Payer: Self-pay | Admitting: Pharmacist

## 2023-12-26 ENCOUNTER — Ambulatory Visit (INDEPENDENT_AMBULATORY_CARE_PROVIDER_SITE_OTHER): Admitting: Pharmacist

## 2023-12-26 VITALS — BP 128/73 | HR 76 | Wt 254.6 lb

## 2023-12-26 DIAGNOSIS — N1832 Chronic kidney disease, stage 3b: Secondary | ICD-10-CM

## 2023-12-26 DIAGNOSIS — E7800 Pure hypercholesterolemia, unspecified: Secondary | ICD-10-CM

## 2023-12-26 DIAGNOSIS — I10 Essential (primary) hypertension: Secondary | ICD-10-CM | POA: Diagnosis not present

## 2023-12-26 DIAGNOSIS — E1122 Type 2 diabetes mellitus with diabetic chronic kidney disease: Secondary | ICD-10-CM | POA: Diagnosis not present

## 2023-12-26 NOTE — Assessment & Plan Note (Signed)
 ASCVD risk - primary prevention in patient with diabetes. Last LDL is 96 not at goal of <70 mg/dL. ASCVD risk factors include DM, hypertension, HLD and 10-year ASCVD risk score of 40.3. high intensity statin indicated.  - Continue rosuvastatin  20 mg daily, bempedoic acid-ezetimibe  180-10 mg daily

## 2023-12-26 NOTE — Assessment & Plan Note (Signed)
 Diabetes longstanding currently uncontrolled, but improving. Patient is able to verbalize appropriate hypoglycemia management plan. Medication adherence appears good. Control is suboptimal due to having a poor diet over the summer. - Continue Jardiance  (empagliflozin ) 25 mg daily, Novolog  (insulin  aspart) 14-18 units twice daily with meals, Tresiba  (insulin  degludec) 24 units once daily - Discussed Ozempic  (semaglutide ) not helping with further weight loss, but wanted to continue on rather than transition to Mounjaro (tirzepatide)  -Patient educated on purpose, proper use, and potential adverse effects.  -Extensively discussed pathophysiology of diabetes, recommended lifestyle interventions, dietary effects on blood sugar control.  -Counseled on s/sx of and management of hypoglycemia.

## 2023-12-26 NOTE — Assessment & Plan Note (Signed)
 Hypertension longstanding currently controlled. Blood pressure goal of <130/80 mmHg. Medication adherence good.  - Continue amlodipine  5 mg daily, losartan -hydrochlorothiazide  100-12.5 mg daily, metoprolol  succinate 50 mg daily, spironolactone  25 mg daily

## 2023-12-26 NOTE — Patient Instructions (Signed)
 It was nice to see you today!  Your goal blood sugar is 80-130 before eating and less than 180 after eating.  Medication Changes:  Continue all medication the same.  Keep trying to work on exercise! Walking may be helpful to get started exercising to help you meet your BMI goal & your knee surgery!!   Keep up the good work with diet and exercise. Aim for a diet full of vegetables, fruit and lean meats (chicken, [place], fish). Try to limit salt intake by eating fresh or frozen vegetables (instead of canned), rinse canned vegetables prior to cooking and do not add any additional salt to meals.

## 2023-12-26 NOTE — Progress Notes (Signed)
 S:     Chief Complaint  Patient presents with   Medication Management    Med Review & DM     78 y.o. female who presents for diabetes evaluation, education, and management. Patient arrives in good spirits and presents with a cane for assistance. She is sleeping a lot now, 8-10 hrs of sleep. Feels that she has leveled out on her weight loss with Ozempic  (semaglutide ).   Patient was referred and last seen by Primary Care Provider, Dr. Madelon, on 12/20/23.  At last visit, made no changes to medication regimen - at goal for DM and slightly elevated Blood Pressure.   PMH is significant for T2DM, hypertension, hyperlipidemia, Diabetic Neuropathy, Chronic Kidney Disease 3B, OSA.  Patient reports Diabetes was diagnosed in 1993.   Family/Social History: 3 siblings have diabetes, loss of brother due to diabetes complications, Mother's side of family has a lot of diabetes  Current diabetes medications include: Jardiance  (empagliflozin ) 25 mg daily, Novolog  (insulin  aspart) 14-18 units twice daily with meals, Tresiba  (insulin  degludec) 24 units once daily (occasionally will split dose into 12 mg BID)   Current hypertension medications include: amlodipine  5 mg daily, losartan -hydrochlorothiazide  100-12.5 mg daily, metoprolol  succinate 50 mg daily, spironolactone  25 mg daily Current hyperlipidemia medications include: rosuvastatin  20 mg daily, bempedoic acid-ezetimibe  180-10 mg daily  Patient reports adherence to taking all medications as prescribed.   Do you feel that your medications are working for you? yes Have you been experiencing any side effects to the medications prescribed? No Do you have any problems obtaining medications due to transportation or finances? No - Glad she can get insulins cheaper now, discussed Mounjaro (tirzepatide) but is fine with Ozempic  (semaglutide ) due to cost Insurance coverage: [employer]  Patient reports hypoglycemic events.If she goes low she  wakes up shaky/sweating and is able to take something (crackers or PBJ)   Patient reports nocturia (nighttime urination). ~3-4x nightly Patient reports neuropathy (nerve pain). Patient reports visual changes. Patient reports self foot exams.   Patient reported dietary habits: eating was horrible during summer because she was caring for her grandkids Breakfast: may have some if her sister makes eggs, breakfast casserole Dinner: chili, spaghetti -- orders in food quite a bit, but tries to get vegetables with her order Snacks: has been eating less carbs at night - skipped rice with chinese food yesterday!  Drinks: tries to avoid juices  Within the past 12 months, did you worry whether your food would run out before you got money to buy more? no Within the past 12 months, did the food you bought run out, and you didn't have money to get more? no  Patient-reported exercise habits: close to none due to pain in knees/feet   O:   Review of Systems  Eyes:        Vision issues in right eye  Musculoskeletal:  Positive for joint pain.  All other systems reviewed and are negative.   Physical Exam Vitals reviewed.  Constitutional:      Appearance: Normal appearance. She is normal weight.  Neurological:     Mental Status: She is alert.    7 day average blood glucose: 164  Libre3 CGM Download today % Time CGM is active: 96.7% Average Glucose: 164 mg/dL Glucose Management Indicator: 7.2%  Glucose Variability: 25.2% (goal <36%) Time in Goal:  - Time in range 70-180: 67% - Time above range: 32% - Time below range: 1% Observed patterns:  Lab Results  Component Value  Date   HGBA1C 8.1 (A) 12/20/2023   Vitals:   12/26/23 1416  BP: 128/73  Pulse: 76  SpO2: 97%    Lipid Panel     Component Value Date/Time   CHOL 176 07/04/2023 1521   TRIG 112 07/04/2023 1521   HDL 62 07/04/2023 1521   CHOLHDL 2.8 07/04/2023 1521   CHOLHDL 4.1 01/20/2015 1003   VLDL 10 01/20/2015 1003    LDLCALC 94 07/04/2023 1521   LDLDIRECT 96 03/23/2021 0931   LDLDIRECT 223 (H) 10/13/2015 0932    Clinical Atherosclerotic Cardiovascular Disease (ASCVD): Yes  The 10-year ASCVD risk score (Arnett DK, et al., 2019) is: 34%   Values used to calculate the score:     Age: 24 years     Clincally relevant sex: Female     Is Non-Hispanic African American: Yes     Diabetic: Yes     Tobacco smoker: No     Systolic Blood Pressure: 128 mmHg     Is BP treated: Yes     HDL Cholesterol: 62 mg/dL     Total Cholesterol: 176 mg/dL   A/P: Diabetes longstanding currently uncontrolled, but improving. Patient is able to verbalize appropriate hypoglycemia management plan. Medication adherence appears good. Control is suboptimal due to having a poor diet over the summer. - Continue Jardiance  (empagliflozin ) 25 mg daily, Novolog  (insulin  aspart) 14-18 units twice daily with meals, Tresiba  (insulin  degludec) 24 units once daily - Discussed Ozempic  (semaglutide ) not helping with further weight loss, but wanted to continue on rather than transition to Mounjaro (tirzepatide)  -Patient educated on purpose, proper use, and potential adverse effects.  -Extensively discussed pathophysiology of diabetes, recommended lifestyle interventions, dietary effects on blood sugar control.  -Counseled on s/sx of and management of hypoglycemia.   ASCVD risk - primary prevention in patient with diabetes. Last LDL is 96 not at goal of <70 mg/dL. ASCVD risk factors include DM, hypertension, HLD and 10-year ASCVD risk score of 40.3. high intensity statin indicated.  - Continue rosuvastatin  20 mg daily, bempedoic acid-ezetimibe  180-10 mg daily  Hypertension longstanding currently controlled. Blood pressure goal of <130/80 mmHg. Medication adherence good.  - Continue amlodipine  5 mg daily, losartan -hydrochlorothiazide  100-12.5 mg daily, metoprolol  succinate 50 mg daily, spironolactone  25 mg daily   Written patient instructions  provided. Patient verbalized understanding of treatment plan.  Total time in face to face counseling 39 minutes.    Follow-up:  PCP clinic visit in 3 months Patient seen with Estefana Blase, PharmD Candidate - [employer] student.

## 2023-12-27 NOTE — Progress Notes (Signed)
 Reviewed and agree with Dr Rennis plan.

## 2023-12-30 ENCOUNTER — Ambulatory Visit

## 2023-12-30 VITALS — Ht 65.0 in | Wt 254.0 lb

## 2023-12-30 DIAGNOSIS — Z0000 Encounter for general adult medical examination without abnormal findings: Secondary | ICD-10-CM

## 2023-12-30 NOTE — Patient Instructions (Signed)
 Rebecca Hicks,  Thank you for taking the time for your Medicare Wellness Visit. I appreciate your continued commitment to your health goals. Please review the care plan we discussed, and feel free to reach out if I can assist you further.  Medicare recommends these wellness visits once per year to help you and your care team stay ahead of potential health issues. These visits are designed to focus on prevention, allowing your provider to concentrate on managing your acute and chronic conditions during your regular appointments.  Please note that Annual Wellness Visits do not include a physical exam. Some assessments may be limited, especially if the visit was conducted virtually. If needed, we may recommend a separate in-person follow-up with your provider.  Ongoing Care Seeing your primary care provider every 3 to 6 months helps us  monitor your health and provide consistent, personalized care.   Referrals If a referral was made during today's visit and you haven't received any updates within two weeks, please contact the referred provider directly to check on the status.  Recommended Screenings:  Health Maintenance  Topic Date Due   Zoster (Shingles) Vaccine (1 of 2) Never done   Flu Shot  11/22/2023   COVID-19 Vaccine (6 - 2025-26 season) 12/23/2023   Eye exam for diabetics  12/11/2023   Hemoglobin A1C  06/20/2024   Complete foot exam   08/21/2024   Yearly kidney function blood test for diabetes  12/19/2024   Yearly kidney health urinalysis for diabetes  12/19/2024   Medicare Annual Wellness Visit  12/29/2024   Colon Cancer Screening  08/16/2026   DTaP/Tdap/Td vaccine (3 - Td or Tdap) 03/26/2032   Pneumococcal Vaccine for age over 27  Completed   DEXA scan (bone density measurement)  Completed   Hepatitis C Screening  Completed   HPV Vaccine  Aged Out   Meningitis B Vaccine  Aged Out       12/30/2023    4:43 PM  Advanced Directives  Does Patient Have a Medical Advance Directive?  No  Would patient like information on creating a medical advance directive? No - Patient declined   Advance Care Planning is important because it: Ensures you receive medical care that aligns with your values, goals, and preferences. Provides guidance to your family and loved ones, reducing the emotional burden of decision-making during critical moments.  Vision: Annual vision screenings are recommended for early detection of glaucoma, cataracts, and diabetic retinopathy. These exams can also reveal signs of chronic conditions such as diabetes and high blood pressure.  Dental: Annual dental screenings help detect early signs of oral cancer, gum disease, and other conditions linked to overall health, including heart disease and diabetes.  Please see the attached documents for additional preventive care recommendations.

## 2023-12-30 NOTE — Progress Notes (Cosign Needed Addendum)
 Because this visit was a virtual/telehealth visit,  certain criteria was not obtained, such a blood pressure, CBG if applicable, and timed get up and go. Any medications not marked as taking were not mentioned during the medication reconciliation part of the visit. Any vitals not documented were not able to be obtained due to this being a telehealth visit or patient was unable to self-report a recent blood pressure reading due to a lack of equipment at home via telehealth. Vitals that have been documented are verbally provided by the patient.   Subjective:   Rebecca Hicks is a 78 y.o. who presents for a Medicare Wellness preventive visit.  As a reminder, Annual Wellness Visits don't include a physical exam, and some assessments may be limited, especially if this visit is performed virtually. We may recommend an in-person follow-up visit with your provider if needed.  Visit Complete: Virtual I connected with  Rebecca Hicks on 12/30/23 by a audio enabled telemedicine application and verified that I am speaking with the correct person using two identifiers.  Patient Location: Home  Provider Location: Home Office  I discussed the limitations of evaluation and management by telemedicine. The patient expressed understanding and agreed to proceed.  Vital Signs: Because this visit was a virtual/telehealth visit, some criteria may be missing or patient reported. Any vitals not documented were not able to be obtained and vitals that have been documented are patient reported.  VideoDeclined- This patient declined Librarian, academic. Therefore the visit was completed with audio only.  Persons Participating in Visit: Patient.  AWV Questionnaire: No: Patient Medicare AWV questionnaire was not completed prior to this visit.  Cardiac Risk Factors include: advanced age (>28men, >73 women);sedentary lifestyle;obesity (BMI >30kg/m2);hypertension;family history of  premature cardiovascular disease;dyslipidemia     Objective:    Today's Vitals   12/30/23 1640  Weight: 254 lb (115.2 kg)  Height: 5' 5 (1.651 m)  PainSc: 0-No pain   Body mass index is 42.27 kg/m.     12/30/2023    4:43 PM 12/20/2023   10:25 AM 10/14/2023    4:16 PM 01/14/2023    9:54 AM 11/06/2022    9:35 AM 04/19/2022   11:09 AM 11/27/2021   11:23 AM  Advanced Directives  Does Patient Have a Medical Advance Directive? No No No No No No No  Would patient like information on creating a medical advance directive? No - Patient declined  No - Patient declined No - Patient declined No - Patient declined No - Patient declined     Current Medications (verified) Outpatient Encounter Medications as of 12/30/2023  Medication Sig   Accu-Chek Softclix Lancets lancets Use as instructed (Patient not taking: Reported on 12/26/2023)   acetaminophen  (TYLENOL ) 500 MG tablet Take 1,000 mg by mouth every 6 (six) hours as needed for mild pain (pain score 1-3).   albuterol  (PROAIR  HFA) 108 (90 Base) MCG/ACT inhaler Inhale 2 puffs into the lungs every 6 (six) hours as needed for wheezing or shortness of breath.   amLODipine  (NORVASC ) 5 MG tablet TAKE 1 TABLET BY MOUTH DAILY   aspirin 81 MG tablet Take 81 mg by mouth daily.   Bempedoic Acid-Ezetimibe  (NEXLIZET ) 180-10 MG TABS Take 1 tablet by mouth daily.   Cholecalciferol (VITAMIN D3) 50 MCG (2000 UT) capsule Take 2,000 Units by mouth daily.   Continuous Glucose Sensor (DEXCOM G7 SENSOR) MISC Apply 1 sensor every 10 days.   DULoxetine  (CYMBALTA ) 60 MG capsule Take 1 capsule (60  mg total) by mouth daily.   empagliflozin  (JARDIANCE ) 25 MG TABS tablet Take 1 tablet (25 mg total) by mouth daily.   glucose blood (ACCU-CHEK SMARTVIEW) test strip Use as instructed   insulin  aspart (NOVOLOG  FLEXPEN) 100 UNIT/ML FlexPen Inject 12 Units into the skin 2 (two) times daily with a meal. (Patient taking differently: Inject 12 Units into the skin 2 (two) times daily with  a meal. 14-16 units)   insulin  degludec (TRESIBA  FLEXTOUCH) 100 UNIT/ML FlexTouch Pen Inject 25 Units into the skin daily. (Patient taking differently: Inject 24 Units into the skin daily.)   Insulin  Pen Needle (B-D ULTRAFINE III SHORT PEN) 31G X 8 MM MISC USE TO INJECT INSULIN  4 times daily.   losartan -hydrochlorothiazide  (HYZAAR) 100-12.5 MG tablet Take 1 tablet by mouth daily.   metFORMIN  (GLUCOPHAGE -XR) 500 MG 24 hr tablet Take 2 tablets (1,000 mg total) by mouth daily.   metoprolol  succinate (TOPROL -XL) 50 MG 24 hr tablet Take 1 tablet (50 mg total) by mouth daily. Take with or immediately following a meal.   nitroGLYCERIN  (NITROSTAT ) 0.4 MG SL tablet Place 1 tablet (0.4 mg total) under the tongue every 5 (five) minutes as needed for chest pain.   omeprazole  (PRILOSEC) 40 MG capsule TAKE 1 CAPSULE BY MOUTH DAILY AS NEEDED   prednisoLONE acetate (PRED FORTE) 1 % ophthalmic suspension Place 1 drop into the left eye 4 (four) times daily.   rosuvastatin  (CRESTOR ) 20 MG tablet Take 1 tablet (20 mg total) by mouth daily.   Semaglutide , 2 MG/DOSE, (OZEMPIC , 2 MG/DOSE,) 8 MG/3ML SOPN Inject 2 mg into the skin once a week.   spironolactone  (ALDACTONE ) 25 MG tablet Take 1 tablet (25 mg total) by mouth daily.   timolol (TIMOPTIC) 0.5 % ophthalmic solution Place 1 drop into both eyes 2 times daily.   No facility-administered encounter medications on file as of 12/30/2023.    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  Watch for overdiuresis.    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 - She was fitted with a Small size [employer] Full Face Mask Simplus mask and heated humidification.     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, POSITIVE 02/07/2007   Annotation:  with clear CXR mid 1990's Qualifier: History of  By: Vicci RN, Erika     TOBACCO USE, QUIT 02/07/2007   Qualifier: Diagnosis of  By: Scarlet MD, William     Tremor of both hands 12/15/2015   U R I 03/15/2010   Qualifier: Diagnosis of  By: Scarlet MD, Elsie     Past Surgical History:  Procedure Laterality Date   ABDOMINAL HYSTERECTOMY     APPENDECTOMY     CATARACT EXTRACTION, BILATERAL Bilateral 03/2010, 04/2010   COLONOSCOPY  2011   POLYPECTOMY  2011   polyps, hems    SPINE SURGERY     lumbar laminectomy X 2   TONSILLECTOMY     Family History  Problem Relation Age of Onset   Cervical cancer Mother    Stroke Father    Alcohol abuse Father    Kidney failure Sister        s/p transplant   Heart disease Brother    Alcohol abuse Brother    Stroke Paternal Grandmother    Breast cancer Maternal Aunt    Pancreatic cancer Maternal Uncle    Hypertension Other        siblings   Diabetes Other        siblings   Colon cancer Neg Hx    Esophageal cancer Neg Hx    Rectal cancer Neg Hx    Stomach cancer Neg Hx    Colon polyps Neg Hx    Crohn's disease Neg Hx    Social History   Socioeconomic History   Marital status: Single    Spouse name: Not on file   Number of children: 0   Years of education: college   Highest education level: Bachelor's degree (e.g., BA, AB, BS)  Occupational History   Occupation: retired    Associate Professor: [employer]  Tobacco Use   Smoking status: Former    Current packs/day: 0.00    Average packs/day: 1 pack/day for 20.0 years (20.0 ttl pk-yrs)    Types: Cigarettes    Start date: 04/24/1975    Quit date: 04/24/1995    Years since quitting: 28.7    Passive exposure: Never   Smokeless tobacco: Never  Vaping Use   Vaping status: Never Used  Substance and Sexual Activity   Alcohol use: Yes    Alcohol/week: 0.0 standard drinks of alcohol    Comment: rarely   Drug use:  No   Sexual activity: Not Currently  Other Topics Concern   Not on file   Social History Narrative   Lives with 2 sisters and a nephew in a 2 story home but stays on the first floor.  Retired from the [employer].  Education: college.    Social Drivers of Corporate investment banker Strain: Low Risk  (12/30/2023)   Overall Financial Resource Strain (CARDIA)    Difficulty of Paying Living Expenses: Not very hard  Food Insecurity: No Food Insecurity (12/30/2023)   Hunger Vital Sign    Worried About Running Out of Food in the Last Year: Never true    Ran Out of Food in the Last Year: Never true  Transportation Needs: No Transportation Needs (12/30/2023)   PRAPARE - Administrator, Civil Service (Medical): No    Lack of Transportation (Non-Medical): No  Physical Activity: Inactive (12/30/2023)   Exercise Vital Sign    Days of Exercise per Week: 0 days    Minutes of Exercise per Session: 0 min  Stress: No Stress Concern Present (12/30/2023)   [employer] of Occupational Health - Occupational Stress Questionnaire    Feeling of Stress: Only a little  Social Connections: Socially Isolated (12/30/2023)   Social Connection and Isolation Panel    Frequency of Communication with Friends and Family: More than three times a week    Frequency of Social Gatherings with Friends and Family: Twice a week    Attends Religious Services: Patient declined    Database administrator or Organizations: No    Attends Engineer, structural: Never    Marital Status: Never married    Tobacco Counseling Counseling given: Not Answered    Clinical Intake:  Pre-visit preparation completed: Yes  Pain : No/denies pain Pain Score: 0-No pain     BMI - recorded: 42.27 Nutritional Status: BMI > 30  Obese Nutritional Risks: None Diabetes: Yes CBG done?: No Did pt. bring in CBG monitor from home?: No  Lab Results  Component Value Date   HGBA1C 8.1 (A) 12/20/2023   HGBA1C 8.9 (A) 07/04/2023   HGBA1C 8.1 (A) 01/14/2023     How often do you need to  have someone help you when you read instructions, pamphlets, or other written materials from your doctor or pharmacy?: 1 - Never What is the last grade level you completed in school?: [employer]  Interpreter Needed?: No  Information entered by :: Phoenix Riesen N. Corinne Goucher, LPN.   Activities of Daily Living     12/30/2023    4:47 PM  In your present state of health, do you have any difficulty performing the following activities:  Hearing? 0  Vision? 0  Difficulty concentrating or making decisions? 0  Comment Patient does games on phone and Ipad  Walking or climbing stairs? 0  Dressing or bathing? 0  Doing errands, shopping? 0  Preparing Food and eating ? N  Using the Toilet? N  In the past six months, have you accidently leaked urine? N  Do you have problems with loss of bowel control? N  Managing your Medications? N  Managing your Finances? N  Housekeeping or managing your Housekeeping? N    Patient Care Team: Madelon Donald HERO, DO as PCP - General (Family Medicine) Nahser, Aleene PARAS, MD (Inactive) as PCP - Cardiology (Cardiology) Gaspar Kung, MD as Consulting Physician (Orthopedic Surgery) Caresse Cough, MD as Referring Physician (Ophthalmology)  I have updated your Care Teams any  recent Medical Services you may have received from other providers in the past year.     Assessment:   This is a routine wellness examination for Shineka.  Hearing/Vision screen Hearing Screening - Comments:: Denies hearing difficulties.   Vision Screening - Comments:: Wears rx glasses - up to date with routine eye exams every 6 months Dr. Paticia Fairly    Goals Addressed             This Visit's Progress    12/30/2023: My goal is tolose weight and get my BMI down so that I can have right knee surgery.         Depression Screen     12/30/2023    4:50 PM 12/20/2023   10:25 AM 10/14/2023    4:17 PM 01/14/2023    9:55 AM 11/06/2022    9:39 AM 05/31/2022   10:43 AM 04/19/2022   11:09  AM  PHQ 2/9 Scores  PHQ - 2 Score 1 1 0 0 0 0 0  PHQ- 9 Score 3 3 2 2  0 2 5    Fall Risk     12/30/2023    4:42 PM 12/20/2023   10:26 AM 10/14/2023    4:17 PM 01/14/2023    9:54 AM 11/06/2022    9:35 AM  Fall Risk   Falls in the past year? 1 0 1 0 0  Number falls in past yr: 0  0  0  Injury with Fall? 1  1  0  Follow up Falls evaluation completed;Falls prevention discussed    Falls evaluation completed;Education provided;Falls prevention discussed    MEDICARE RISK AT HOME:  Medicare Risk at Home Any stairs in or around the home?: Yes (Bedroom on main floor) If so, are there any without handrails?: No Home free of loose throw rugs in walkways, pet beds, electrical cords, etc?: Yes Adequate lighting in your home to reduce risk of falls?: Yes Life alert?: No Use of a cane, walker or w/c?: Yes Grab bars in the bathroom?: Yes Shower chair or bench in shower?: No Elevated toilet seat or a handicapped toilet?: Yes  TIMED UP AND GO:  Was the test performed?  No  Cognitive Function: Declined/Normal: No cognitive concerns noted by patient or family. Patient alert, oriented, able to answer questions appropriately and recall recent events. No signs of memory loss or confusion.    12/30/2023    4:49 PM  MMSE - Mini Mental State Exam  Not completed: Unable to complete        12/30/2023    4:55 PM 11/06/2022    9:42 AM  6CIT Screen  What Year? 0 points 0 points  What month? 0 points 0 points  What time? 0 points 0 points  Count back from 20 0 points 0 points  Months in reverse 0 points 0 points  Repeat phrase 0 points 0 points  Total Score 0 points 0 points    Immunizations Immunization History  Administered Date(s) Administered   Fluad Quad(high Dose 65+) 02/19/2019, 03/23/2021, 04/10/2022   INFLUENZA, HIGH DOSE SEASONAL PF 02/26/2018   Influenza Split 02/13/2011, 03/12/2012   Influenza Whole 04/03/2007, 02/23/2008, 02/25/2009   Influenza,inj,Quad PF,6+ Mos 03/04/2013,  04/07/2014, 02/15/2015, 12/15/2015, 03/13/2017, 04/05/2020   PFIZER(Purple Top)SARS-COV-2 Vaccination 06/13/2019, 07/07/2019, 04/05/2020   Pfizer Covid-19 Vaccine Bivalent Booster 44yrs & up 03/23/2021   Pfizer(Comirnaty)Fall Seasonal Vaccine 12 years and older 09/19/2022   Pneumococcal Conjugate-13 04/07/2014   Pneumococcal Polysaccharide-23 03/12/2012   Td 02/25/2009  Tdap 03/26/2022    Screening Tests Health Maintenance  Topic Date Due   Zoster Vaccines- Shingrix (1 of 2) Never done   Influenza Vaccine  11/22/2023   COVID-19 Vaccine (6 - 2025-26 season) 12/23/2023   OPHTHALMOLOGY EXAM  12/11/2023   HEMOGLOBIN A1C  06/20/2024   FOOT EXAM  08/21/2024   Diabetic kidney evaluation - eGFR measurement  12/19/2024   Diabetic kidney evaluation - Urine ACR  12/19/2024   Medicare Annual Wellness (AWV)  12/29/2024   Colonoscopy  08/16/2026   DTaP/Tdap/Td (3 - Td or Tdap) 03/26/2032   Pneumococcal Vaccine: 50+ Years  Completed   DEXA SCAN  Completed   Hepatitis C Screening  Completed   HPV VACCINES  Aged Out   Meningococcal B Vaccine  Aged Out    Health Maintenance Items Addressed: Patient aware of current care gaps.    Additional Screening:  Vision Screening: Recommended annual ophthalmology exams for early detection of glaucoma and other disorders of the eye. Is the patient up to date with their annual eye exam?  Yes  Who is the provider or what is the name of the office in which the patient attends annual eye exams? Dr. Maree and Dr. Noralee with [facility]  Dental Screening: Recommended annual dental exams for proper oral hygiene  Community Resource Referral / Chronic Care Management: CRR required this visit?  No   CCM required this visit?  No   Plan:    I have personally reviewed and noted the following in the patient's chart:   Medical and social history Use of alcohol, tobacco or illicit drugs  Current medications and supplements  including opioid prescriptions. Patient is not currently taking opioid prescriptions. Functional ability and status Nutritional status Physical activity Advanced directives List of other physicians Hospitalizations, surgeries, and ER visits in previous 12 months Vitals Screenings to include cognitive, depression, and falls Referrals and appointments  In addition, I have reviewed and discussed with patient certain preventive protocols, quality metrics, and best practice recommendations. A written personalized care plan for preventive services as well as general preventive health recommendations were provided to patient.   Roz LOISE Fuller, LPN   [id number]   After Visit Summary: (MyChart) Due to this being a telephonic visit, the after visit summary with patients personalized plan was offered to patient via MyChart   Notes: Nothing significant to report at this time.

## 2024-01-09 ENCOUNTER — Telehealth: Payer: Self-pay

## 2024-01-09 NOTE — Telephone Encounter (Addendum)
 SABRA

## 2024-01-15 DIAGNOSIS — M170 Bilateral primary osteoarthritis of knee: Secondary | ICD-10-CM | POA: Diagnosis not present

## 2024-01-24 ENCOUNTER — Encounter: Payer: Self-pay | Admitting: Pharmacist

## 2024-01-24 ENCOUNTER — Telehealth: Payer: Self-pay

## 2024-01-24 NOTE — Progress Notes (Addendum)
 This patient is appearing on a report for being at risk of failing the adherence measure for hypertension (ACEi/ARB) medications this calendar year.   Medication: Losartan  hydrochlorothiazide   Last fill date: 12/20/23 for 100 day supply  Insurance report was not up to date. No action needed at this time.    Received request for refill of ADHD Medication. Rumball, Alison M, DO Current Outpatient Medications  Medication Sig Dispense Refill   Accu-Chek Softclix Lancets lancets Use as instructed (Patient not taking: Reported on 12/26/2023) 100 each 12   acetaminophen  (TYLENOL ) 500 MG tablet Take 1,000 mg by mouth every 6 (six) hours as needed for mild pain (pain score 1-3).     albuterol  (PROAIR  HFA) 108 (90 Base) MCG/ACT inhaler Inhale 2 puffs into the lungs every 6 (six) hours as needed for wheezing or shortness of breath. 1 each 2   amLODipine  (NORVASC ) 5 MG tablet TAKE 1 TABLET BY MOUTH DAILY 90 tablet 3   aspirin 81 MG tablet Take 81 mg by mouth daily.     Bempedoic Acid-Ezetimibe  (NEXLIZET ) 180-10 MG TABS Take 1 tablet by mouth daily. 90 tablet 2   Cholecalciferol (VITAMIN D3) 50 MCG (2000 UT) capsule Take 2,000 Units by mouth daily.     Continuous Glucose Sensor (DEXCOM G7 SENSOR) MISC Apply 1 sensor every 10 days. 3 each 11   DULoxetine  (CYMBALTA ) 60 MG capsule Take 1 capsule (60 mg total) by mouth daily. 90 capsule 3   empagliflozin  (JARDIANCE ) 25 MG TABS tablet Take 1 tablet (25 mg total) by mouth daily. 90 tablet 3   glucose blood (ACCU-CHEK SMARTVIEW) test strip Use as instructed 100 each 12   insulin  aspart (NOVOLOG  FLEXPEN) 100 UNIT/ML FlexPen Inject 12 Units into the skin 2 (two) times daily with a meal. (Patient taking differently: Inject 12 Units into the skin 2 (two) times daily with a meal. 14-16 units) 15 mL 11   insulin  degludec (TRESIBA  FLEXTOUCH) 100 UNIT/ML FlexTouch Pen Inject 25 Units into the skin daily. (Patient taking differently: Inject 24 Units into the skin daily.) 15  mL 0   Insulin  Pen Needle (B-D ULTRAFINE III SHORT PEN) 31G X 8 MM MISC USE TO INJECT INSULIN  4 times daily. 360 each 3   losartan -hydrochlorothiazide  (HYZAAR) 100-12.5 MG tablet Take 1 tablet by mouth daily. 90 tablet 3   metFORMIN  (GLUCOPHAGE -XR) 500 MG 24 hr tablet Take 2 tablets (1,000 mg total) by mouth daily. 270 tablet 3   metoprolol  succinate (TOPROL -XL) 50 MG 24 hr tablet Take 1 tablet (50 mg total) by mouth daily. Take with or immediately following a meal. 90 tablet 3   nitroGLYCERIN  (NITROSTAT ) 0.4 MG SL tablet Place 1 tablet (0.4 mg total) under the tongue every 5 (five) minutes as needed for chest pain. 25 tablet 3   omeprazole  (PRILOSEC) 40 MG capsule TAKE 1 CAPSULE BY MOUTH DAILY AS NEEDED 100 capsule 2   prednisoLONE acetate (PRED FORTE) 1 % ophthalmic suspension Place 1 drop into the left eye 4 (four) times daily.     rosuvastatin  (CRESTOR ) 20 MG tablet Take 1 tablet (20 mg total) by mouth daily. 90 tablet 3   Semaglutide , 2 MG/DOSE, (OZEMPIC , 2 MG/DOSE,) 8 MG/3ML SOPN Inject 2 mg into the skin once a week.     spironolactone  (ALDACTONE ) 25 MG tablet Take 1 tablet (25 mg total) by mouth daily. 90 tablet 3   timolol (TIMOPTIC) 0.5 % ophthalmic solution Place 1 drop into both eyes 2 times daily.  No current facility-administered medications for this visit.   This patient is appearing on a report for being at risk of failing the adherence measure for cholesterol (statin) medications this calendar year.   Medication: rosuvastatin  20 mg Last fill date: 12/25/23 for 100 day supply  Insurance report was not up to date. No action needed at this time.  and Reviewed medication indication, dosing, and goals of therapy.

## 2024-01-24 NOTE — Telephone Encounter (Signed)
 NOVO NORDISK UPDATE- Re-enrollment begins Oct 15th.  The following changes are in order for 2026:   Medicare patients will no longer be eligible for Ozempic .    Uninsured patients will still have access to Ozempic ; however, their total household income must be at or below 200% of the federal poverty level.   Rybelsus will be removed completely from the program.   Medicare patients on insulin  whose household income is below 150% of the federal poverty level will need to apply for Low Income Subsidy/Extra Help with Social Security. If denied, we will be able to use that letter to attempt enrollment/re-enrollment with the company for all other products.   Uninsured patients must provide proof of a Medicaid denial prior to enrollment if the patient's total household income meets their state federal poverty limit thresholds.   ________________________________________________________________________________________________________   Income limits for Low Income Subsidy/Extra Help with Social Security   Individual: Annual income under [phone] (about $1950/month) Married couple: Annual income under [phone] (about $2600/month)

## 2024-02-04 DIAGNOSIS — D8689 Sarcoidosis of other sites: Secondary | ICD-10-CM | POA: Diagnosis not present

## 2024-02-04 DIAGNOSIS — E11311 Type 2 diabetes mellitus with unspecified diabetic retinopathy with macular edema: Secondary | ICD-10-CM | POA: Diagnosis not present

## 2024-02-04 DIAGNOSIS — Z961 Presence of intraocular lens: Secondary | ICD-10-CM | POA: Diagnosis not present

## 2024-02-04 DIAGNOSIS — H35342 Macular cyst, hole, or pseudohole, left eye: Secondary | ICD-10-CM | POA: Diagnosis not present

## 2024-02-04 DIAGNOSIS — E113313 Type 2 diabetes mellitus with moderate nonproliferative diabetic retinopathy with macular edema, bilateral: Secondary | ICD-10-CM | POA: Diagnosis not present

## 2024-02-07 DIAGNOSIS — L84 Corns and callosities: Secondary | ICD-10-CM | POA: Diagnosis not present

## 2024-02-07 DIAGNOSIS — E1151 Type 2 diabetes mellitus with diabetic peripheral angiopathy without gangrene: Secondary | ICD-10-CM | POA: Diagnosis not present

## 2024-02-07 DIAGNOSIS — S92405A Nondisplaced unspecified fracture of left great toe, initial encounter for closed fracture: Secondary | ICD-10-CM | POA: Diagnosis not present

## 2024-02-07 DIAGNOSIS — M19071 Primary osteoarthritis, right ankle and foot: Secondary | ICD-10-CM | POA: Diagnosis not present

## 2024-02-12 ENCOUNTER — Telehealth: Payer: Self-pay

## 2024-02-12 NOTE — Telephone Encounter (Signed)
 In process of completing Novo Nordisk refills for patients OZEMPIC  2MG  medication.  Application in Dr. Lawanda box awaiting signature.

## 2024-02-18 ENCOUNTER — Telehealth: Payer: Self-pay

## 2024-02-18 NOTE — Telephone Encounter (Signed)
 Rec'd 2 boxes Tresiba  U100 (125 day supply) Rec'd 2 boxes Novolog  Flexpen (83 day supply)

## 2024-02-18 NOTE — Telephone Encounter (Signed)
 Faxed refills to [facility] Nordisk

## 2024-02-24 NOTE — Telephone Encounter (Signed)
 Informed patient her shipment is ready for pickup. Patient also aware of 2026 changes with Novo Nordisk.

## 2024-02-26 NOTE — Telephone Encounter (Signed)
 Shipment given to patients sister.

## 2024-02-27 ENCOUNTER — Other Ambulatory Visit: Payer: Self-pay | Admitting: Family Medicine

## 2024-02-27 DIAGNOSIS — E11649 Type 2 diabetes mellitus with hypoglycemia without coma: Secondary | ICD-10-CM

## 2024-02-27 DIAGNOSIS — E1140 Type 2 diabetes mellitus with diabetic neuropathy, unspecified: Secondary | ICD-10-CM

## 2024-03-03 NOTE — Telephone Encounter (Signed)
 Rec'd 2 boxes Ozempic  2mg  dose pens.   Novo Nordisk 2026 changes paperwork placed in patients bag.  Medicare Part D patients will no longer be eligible to receive Ozempic  in 2026.

## 2024-03-03 NOTE — Telephone Encounter (Signed)
 Paperwork placed in patients Ozempic  shipment.

## 2024-03-04 NOTE — Telephone Encounter (Signed)
 Patient informed her shipment is ready for pickup.  Patient also aware of 2026 changes.   Believes she will be denied LIS. Says she will go ahead and apply and get back with me as soon as she gets a decision from them. If denied we will attempt re-enrollment for her insulins.

## 2024-03-06 NOTE — Telephone Encounter (Signed)
 Patient's sister presents to nurse clinic to pick up medication shipment.   Provided to sister per note from [place].   Chiquita JAYSON English, RN

## 2024-03-30 ENCOUNTER — Telehealth: Payer: Self-pay

## 2024-03-30 NOTE — Telephone Encounter (Signed)
 Patient was identified as falling into the [place] Measure - Diabetes.   Patient was: Appointment already scheduled for:  04/06/2024.

## 2024-04-05 NOTE — Progress Notes (Deleted)
° ° °  SUBJECTIVE:   CHIEF COMPLAINT / HPI:   Discussed the use of AI scribe software for clinical note transcription with the patient, who gave verbal consent to proceed.  History of Present Illness   Dm f/u   Weariing CPAP?  OBJECTIVE:   There were no vitals taken for this visit.  Gen: well appearing, in NAD Card: RRR Lungs: CTAB Ext: WWP, no edema ***  ASSESSMENT/PLAN:   No problem-specific Assessment & Plan notes found for this encounter.     Assessment and Plan Assessment & Plan         [name], DO

## 2024-04-05 NOTE — Assessment & Plan Note (Deleted)
 Continue to follow with Ophthalmology.

## 2024-04-06 ENCOUNTER — Ambulatory Visit: Admitting: Family Medicine

## 2024-04-07 ENCOUNTER — Ambulatory Visit: Admitting: Pharmacist

## 2024-04-08 NOTE — Progress Notes (Unsigned)
° ° °  SUBJECTIVE:   CHIEF COMPLAINT / HPI:   Discussed the use of AI scribe software for clinical note transcription with the patient, who gave verbal consent to proceed.  History of Present Illness Rebecca Hicks is a 78 year old female with type 2 diabetes and obesity who presents with nausea and vomiting, likely related to Ozempic  use. She is accompanied by her sister.  Gastrointestinal symptoms - Nausea, diarrhea, stomach cramps, gas, and vomiting temporally associated with Ozempic  use - Symptoms worsen at 2 mg dose; better tolerated at 1.5 mg - Most recent episode this morning with vomiting of phlegm after coffee  Antihyperglycemic therapy and glycemic control - Type 2 diabetes managed with Tresiba  24 units each morning, Jardiance  25 mg daily, Metformin  500 mg twice daily, and short-acting insulin  16-18 units in the morning and up to 20 units in the evening, titrated to blood glucose - Glucose levels elevated since steroid knee injections in September and October - Dexcom CGM in use, but accuracy questioned compared to fingerstick readings - Occasional nocturnal hypoglycemia with readings of 50-60 mg/dL  Weight loss - Intentional weight loss for planned knee replacement - Weight decreased from 258 lb in March to 248 lb  Sleep disturbance and daytime fatigue - Poor sleep with nocturnal wakefulness and daytime sleep - Not using CPAP - Daytime fatigue present  Blood pressure monitoring - Home blood pressure typically 125-135/80-90 mmHg - Not checking blood pressure regularly  Dm f/u  Wearing CPAP? Flu shot  OBJECTIVE:   BP 132/81   Pulse 90   Ht 5' 6 (1.676 m)   Wt 248 lb (112.5 kg)   SpO2 96%   BMI 40.03 kg/m   Gen: well appearing, in NAD Card: RRR Lungs: CTAB Ext: WWP, no edema ***  ASSESSMENT/PLAN:   No problem-specific Assessment & Plan notes found for this encounter.     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 Awaiting A1c results, expected similar to GMI of 8.2%. - Ordered A1c test. - Discuss potential need for new Dexcom sensors with pharmacy. - Continue current insulin  regimen. - Consult with endocrinologist Dr. Koval for further management.  Morbid obesity BMI 40.03, close to target for knee replacement surgery. Weight decreased from 258 lbs to 248 lbs. Motivated to lose weight. - Continue weight loss efforts. - Monitor BMI and weight regularly.  Hypertension Blood pressure well-controlled at home, 125-135/80-90 mmHg. - Continue current antihypertensive regimen. - Monitor blood pressure regularly.  Obstructive sleep apnea Not using CPAP. Discussed potential for Inspire device if BMI reduced below 40. - Encouraged trial of new CPAP settings. - Will consider Inspire device if BMI is reduced below 40.  Fatigue and sleep disturbance Fatigue and sleep disturbances possibly related to obstructive sleep apnea and medication side effects. - Encouraged trial of new CPAP settings. - Will consider medication adjustment if fatigue persists.  General Health Maintenance Due for flu shot, updated COVID vaccine, and shingles vaccine. - Administered flu shot and updated COVID vaccine. - Advised to obtain shingles vaccine at pharmacy.        [name], DO

## 2024-04-09 ENCOUNTER — Ambulatory Visit: Admitting: Pharmacist

## 2024-04-09 ENCOUNTER — Encounter: Payer: Self-pay | Admitting: Family Medicine

## 2024-04-09 ENCOUNTER — Ambulatory Visit: Admitting: Family Medicine

## 2024-04-09 VITALS — BP 132/81 | HR 90 | Ht 66.0 in | Wt 248.0 lb

## 2024-04-09 DIAGNOSIS — E11319 Type 2 diabetes mellitus with unspecified diabetic retinopathy without macular edema: Secondary | ICD-10-CM

## 2024-04-09 DIAGNOSIS — I10 Essential (primary) hypertension: Secondary | ICD-10-CM

## 2024-04-09 DIAGNOSIS — E1140 Type 2 diabetes mellitus with diabetic neuropathy, unspecified: Secondary | ICD-10-CM

## 2024-04-09 DIAGNOSIS — I5032 Chronic diastolic (congestive) heart failure: Secondary | ICD-10-CM

## 2024-04-09 DIAGNOSIS — N1832 Chronic kidney disease, stage 3b: Secondary | ICD-10-CM

## 2024-04-09 DIAGNOSIS — J984 Other disorders of lung: Secondary | ICD-10-CM

## 2024-04-09 DIAGNOSIS — E113313 Type 2 diabetes mellitus with moderate nonproliferative diabetic retinopathy with macular edema, bilateral: Secondary | ICD-10-CM

## 2024-04-09 DIAGNOSIS — G4733 Obstructive sleep apnea (adult) (pediatric): Secondary | ICD-10-CM | POA: Diagnosis not present

## 2024-04-09 DIAGNOSIS — G4736 Sleep related hypoventilation in conditions classified elsewhere: Secondary | ICD-10-CM

## 2024-04-09 DIAGNOSIS — N183 Chronic kidney disease, stage 3 unspecified: Secondary | ICD-10-CM

## 2024-04-09 DIAGNOSIS — E1122 Type 2 diabetes mellitus with diabetic chronic kidney disease: Secondary | ICD-10-CM

## 2024-04-09 DIAGNOSIS — Z23 Encounter for immunization: Secondary | ICD-10-CM

## 2024-04-09 NOTE — Patient Instructions (Addendum)
 It was great to see you!  Our plans for today:  - Let me know if you want zofran  for your nausea. - I encourage you to try the new CPAP settings and make a Pulmonology follow up soon.  - No changes to your medications for now. Keep your appointment with Dr. Koval.  - Consider getting your shingles vaccine at your pharmacy.  - Come back in 3 months.  We are checking some labs today, we will release these results to your MyChart.  Take care and seek immediate care sooner if you develop any concerns.   Dr. Nikya Busler

## 2024-04-10 ENCOUNTER — Other Ambulatory Visit: Payer: Self-pay | Admitting: Family Medicine

## 2024-04-10 ENCOUNTER — Ambulatory Visit: Payer: Self-pay | Admitting: Family Medicine

## 2024-04-10 ENCOUNTER — Encounter: Payer: Self-pay | Admitting: Family Medicine

## 2024-04-10 DIAGNOSIS — E11649 Type 2 diabetes mellitus with hypoglycemia without coma: Secondary | ICD-10-CM

## 2024-04-10 DIAGNOSIS — E1122 Type 2 diabetes mellitus with diabetic chronic kidney disease: Secondary | ICD-10-CM

## 2024-04-10 DIAGNOSIS — I5032 Chronic diastolic (congestive) heart failure: Secondary | ICD-10-CM | POA: Insufficient documentation

## 2024-04-10 LAB — BASIC METABOLIC PANEL WITH GFR
BUN/Creatinine Ratio: 18 (ref 12–28)
BUN: 30 mg/dL — ABNORMAL HIGH (ref 8–27)
CO2: 25 mmol/L (ref 20–29)
Calcium: 9.9 mg/dL (ref 8.7–10.3)
Chloride: 97 mmol/L (ref 96–106)
Creatinine, Ser: 1.66 mg/dL — ABNORMAL HIGH (ref 0.57–1.00)
Glucose: 142 mg/dL — ABNORMAL HIGH (ref 70–99)
Potassium: 4.4 mmol/L (ref 3.5–5.2)
Sodium: 139 mmol/L (ref 134–144)
eGFR: 32 mL/min/1.73 — ABNORMAL LOW

## 2024-04-10 NOTE — Assessment & Plan Note (Signed)
 Continue to follow with Ophthalmology.

## 2024-04-10 NOTE — Assessment & Plan Note (Signed)
 Recheck labs. Reviewed recent nephro notes, no changes.

## 2024-04-10 NOTE — Assessment & Plan Note (Signed)
 Continue to follow with Ophthalmology. Work on more optimal glycemic control, see separate problem.

## 2024-04-10 NOTE — Assessment & Plan Note (Addendum)
 Counseled on trying new CPAP settings. Due for Pulm f/u, encouraged to make appt soon. Continue to work on weight loss.

## 2024-04-10 NOTE — Assessment & Plan Note (Signed)
 Contributing to DM2, GERD, OSA. Continue ozempic . Continue lifestyle changes.

## 2024-04-10 NOTE — Assessment & Plan Note (Signed)
 Likely contributing to fatigue, currently noncompliant with CPAP. Encouraged trying new CPAP settings. Due for pulm f/u, encouraged appt. Continue to work on weight loss.

## 2024-04-10 NOTE — Assessment & Plan Note (Addendum)
 Chronic, suboptimal control. GMI 8.2%. given some ?nocturnal lows, hesitate to increase tresiba . Recommend f/u appt with Dr. Koval for full Dexcom report to establish timing of lows and response to short acting insulin  prior to titrating. Consider increase of morning short acting insulin  if lows are only overnight and seldom. No med changes today.  - obtain A1c, labs today - Discuss potential need for new Dexcom sensors with pharmacy. - Continue current insulin  regimen. - Consult with pharmacist Dr. Koval for further management. - f/u with me 3 months.

## 2024-04-10 NOTE — Assessment & Plan Note (Signed)
 Euvolemic. Medically optimized. Continue to follow with Cardiology.

## 2024-04-10 NOTE — Assessment & Plan Note (Signed)
Doing well on current regimen, no changes made today. 

## 2024-04-13 ENCOUNTER — Telehealth: Payer: Self-pay | Admitting: Pharmacist

## 2024-04-13 NOTE — Telephone Encounter (Signed)
 Reviewed and agree with Dr Rennis plan.

## 2024-04-13 NOTE — Telephone Encounter (Signed)
 Attempted to contact patient for follow-up of missed appointment 04/09/24  Patient apologized for missing appointment.  We discussed current glucose control and occasional low readings likely due to taking correction dose of short-acting with minimal food intake, leading to overcorrection of higher reading.   Overall control - GMI 8.0 with average glucose of 196 and almost 50% time in range with ~ 40% 180-250  Total time with patient call and documentation of interaction: 11 minutes. No planned visit scheduled.  Invited to contact our office and schedule PRN.

## 2024-04-20 ENCOUNTER — Other Ambulatory Visit: Payer: Self-pay | Admitting: Family Medicine

## 2024-04-20 DIAGNOSIS — I10 Essential (primary) hypertension: Secondary | ICD-10-CM

## 2024-04-22 ENCOUNTER — Other Ambulatory Visit: Payer: Self-pay | Admitting: Family Medicine

## 2024-04-22 DIAGNOSIS — I10 Essential (primary) hypertension: Secondary | ICD-10-CM

## 2024-05-04 NOTE — Progress Notes (Unsigned)
 CARDIOLOGY CONSULT NOTE       Patient ID: Rebecca Hicks MRN: [id number] DOB/AGE: [birth year] 78 y.o.   Referring Physician: Madelon Primary Physician: Madelon Donald HERO, DO Primary Cardiologist: New Previously Nahser Reason for Consultation: Chest pain    HPI:  79 y.o. previously seen by Dr Alveta last close to 3 years ago. History of atypical chest pain, HTN, DM-2 GERD, OSA pulmonary sarcoid and tremors. She has had multiple normal myovue studies in 2015, 2017 and 2022. Echo done 2019  EF 55-60% and MAC. She has been on Ozempic  for weight loss but gets GI symptoms with it Prior EGD 2020 with some gastritis A1c poorly controled at 8.1 CRF with Cr 1.53 as high as 1.86 a year ago. LDL runs around 94.   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  Watch for overdiuresis.    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 -  She was fitted with a Small size [employer] Full Face Mask Simplus mask and heated humidification.     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, POSITIVE 02/07/2007   Annotation: with clear CXR mid 1990's Qualifier: History of  By: Vicci RN, Erika     TOBACCO USE, QUIT 02/07/2007   Qualifier: Diagnosis of  By: Scarlet MD, William     Tremor of both hands 12/15/2015   U R I 03/15/2010   Qualifier: Diagnosis of  By: Scarlet MD, Elsie Bays History  Problem Relation Age of Onset   Cervical cancer Mother    Stroke Father    Alcohol abuse Father    Kidney failure Sister        s/p transplant   Heart disease Brother    Alcohol abuse Brother    Stroke Paternal  Grandmother    Breast cancer Maternal Aunt    Pancreatic cancer Maternal Uncle    Hypertension Other        siblings   Diabetes Other        siblings   Colon cancer Neg Hx    Esophageal cancer Neg Hx    Rectal cancer Neg Hx    Stomach cancer Neg Hx    Colon polyps Neg Hx    Crohn's disease Neg Hx     Social History   Socioeconomic History   Marital status: Single    Spouse name: Not on file   Number of children: 0   Years of education: college   Highest education level: Bachelor's degree (e.g., BA, AB, BS)  Occupational History   Occupation: retired    Associate Professor: [employer]  Tobacco Use   Smoking status: Former    Current packs/day: 0.00    Average packs/day: 1 pack/day for 20.0 years (20.0 ttl pk-yrs)    Types: Cigarettes    Start date: 04/24/1975    Quit date: 04/24/1995    Years since quitting: 29.0    Passive exposure: Never   Smokeless tobacco: Never  Vaping Use   Vaping status: Never Used  Substance and Sexual Activity   Alcohol use: Yes    Alcohol/week: 0.0 standard drinks of alcohol    Comment: rarely   Drug use: No   Sexual activity: Not Currently  Other Topics Concern   Not on file  Social History Narrative   Lives with 2 sisters and a nephew in a 2 story home but stays on the first floor.  Retired from the [employer].  Education: college.    Social Drivers of Health   Tobacco Use: Medium Risk (04/21/2024)   Received from [facility]   Patient History    Smoking Tobacco Use: Former    Smokeless Tobacco Use: Never    Passive Exposure: Not on file  Financial Resource Strain: Low Risk (04/05/2024)   Overall Financial Resource Strain (CARDIA)    Difficulty of Paying Living Expenses: Not very hard  Food Insecurity: No Food Insecurity (04/05/2024)   Epic    Worried About Programme Researcher, Broadcasting/film/video in the Last Year: Never true    Ran Out of Food in the Last Year: Never true  Transportation Needs: No Transportation Needs (04/05/2024)   Epic     Lack of Transportation (Medical): No    Lack of Transportation (Non-Medical): No  Physical Activity: Inactive (04/05/2024)   Exercise Vital Sign    Days of Exercise per Week: 0 days    Minutes of  Exercise per Session: Not on file  Stress: No Stress Concern Present (04/05/2024)   [employer] of Occupational Health - Occupational Stress Questionnaire    Feeling of Stress: Only a little  Social Connections: Socially Isolated (04/05/2024)   Social Connection and Isolation Panel    Frequency of Communication with Friends and Family: More than three times a week    Frequency of Social Gatherings with Friends and Family: More than three times a week    Attends Religious Services: Patient declined    Active Member of Clubs or Organizations: No    Attends Engineer, Structural: Not on file    Marital Status: Never married  Intimate Partner Violence: Not At Risk (12/30/2023)   Epic    Fear of Current or Ex-Partner: No    Emotionally Abused: No    Physically Abused: No    Sexually Abused: No  Depression (PHQ2-9): Low Risk (12/30/2023)   Depression (PHQ2-9)    PHQ-2 Score: 3  Alcohol Screen: Low Risk (04/05/2024)   Alcohol Screen    Last Alcohol Screening Score (AUDIT): 4  Housing: Low Risk (04/05/2024)   Epic    Unable to Pay for Housing in the Last Year: No    Number of Times Moved in the Last Year: 0    Homeless in the Last Year: No  Utilities: Not At Risk (12/30/2023)   Epic    Threatened with loss of utilities: No  Health Literacy: Adequate Health Literacy (12/30/2023)   B1300 Health Literacy    Frequency of need for help with medical instructions: Never    Past Surgical History:  Procedure Laterality Date   ABDOMINAL HYSTERECTOMY     APPENDECTOMY     CATARACT EXTRACTION, BILATERAL Bilateral 03/2010, 04/2010   COLONOSCOPY  2011   POLYPECTOMY  2011   polyps, hems    SPINE SURGERY     lumbar laminectomy X 2   TONSILLECTOMY       Current  Medications[1]    Physical Exam: There were no vitals taken for this visit.   Affect appropriate Overweight black female  HEENT: normal Neck supple with no adenopathy JVP normal no bruits no thyromegaly Lungs clear with no wheezing and good diaphragmatic motion Heart:  S1/S2 no murmur, no rub, gallop or click PMI normal Abdomen: benighn, BS positve, no tenderness, no AAA no bruit.  No HSM or HJR Distal pulses intact with no bruits No edema Neuro non-focal Skin warm and dry No muscular weakness   Labs:   Lab Results  Component Value Date   WBC 4.7 07/05/2023   HGB 12.9 07/05/2023   HCT 39.6 07/05/2023   MCV 89 07/05/2023   PLT 182 07/05/2023   No results for input(s): NA, K, CL, CO2, BUN, CREATININE, CALCIUM , PROT, BILITOT, ALKPHOS, ALT, AST, GLUCOSE in the last 168 hours.  Invalid input(s): LABALBU Lab Results  Component Value Date   CKTOTAL 124 07/13/2009   TROPONINI <0.03 02/28/2016    Lab Results  Component Value Date   CHOL 176 07/04/2023   CHOL 148 12/12/2021   CHOL 180 12/14/2020   Lab Results  Component Value Date   HDL 62 07/04/2023   HDL 57 12/12/2021   HDL 60 12/14/2020   Lab Results  Component Value Date   LDLCALC 94 07/04/2023   LDLCALC 75 12/12/2021   LDLCALC 100 (H) 12/14/2020   Lab Results  Component Value Date   TRIG 112 07/04/2023   TRIG 85 12/12/2021   TRIG 110  12/14/2020   Lab Results  Component Value Date   CHOLHDL 2.8 07/04/2023   CHOLHDL 2.6 12/12/2021   CHOLHDL 3.0 12/14/2020   Lab Results  Component Value Date   LDLDIRECT 96 03/23/2021   LDLDIRECT 223 (H) 10/13/2015   LDLDIRECT 203 (H) 02/25/2009      Radiology: No results found.  EKG: 2023 SR rate 63 normal    ASSESSMENT AND PLAN:   Chest pain:  atypical multiple prior normal myovues most recently 06/28/20  CRF;s poorly controlled DM, HTN, HLD *** GERD:  prior gastritis ? Component of gastroparesis Symptoms worse with GLP-1 on  prilosec HTN:  home readings ok Continue Hyzaar Toprol   and aldactone  HLD continue on crestor  LDL < 100 *** DM:  poorly controlled on glucophage  insulin , jardiance  and Ozempic  f/u primary OSA:  not wearing CPAP needs to lose weight *** CRF:  f/u primary/ nephrology needs better BS control Cr 1.7  ***  F/U in a year   Signed: Maude Emmer 05/04/2024, 3:50 PM      [1]  Current Outpatient Medications:    Accu-Chek Softclix Lancets lancets, Use as instructed (Patient not taking: Reported on 12/26/2023), Disp: 100 each, Rfl: 12   acetaminophen  (TYLENOL ) 500 MG tablet, Take 1,000 mg by mouth every 6 (six) hours as needed for mild pain (pain score 1-3)., Disp: , Rfl:    albuterol  (PROAIR  HFA) 108 (90 Base) MCG/ACT inhaler, Inhale 2 puffs into the lungs every 6 (six) hours as needed for wheezing or shortness of breath., Disp: 1 each, Rfl: 2   amLODipine  (NORVASC ) 5 MG tablet, TAKE 1 TABLET BY MOUTH DAILY, Disp: 90 tablet, Rfl: 3   aspirin 81 MG tablet, Take 81 mg by mouth daily., Disp: , Rfl:    BD PEN NEEDLE SHORT ULTRAFINE 31G X 8 MM MISC, USE TO INJECT INSULIN  4 TIMES  DAILY, Disp: 400 each, Rfl: 3   Bempedoic Acid-Ezetimibe  (NEXLIZET ) 180-10 MG TABS, Take 1 tablet by mouth daily., Disp: 90 tablet, Rfl: 2   Cholecalciferol (VITAMIN D3) 50 MCG (2000 UT) capsule, Take 2,000 Units by mouth daily., Disp: , Rfl:    Continuous Glucose Sensor (DEXCOM G7 SENSOR) MISC, APPLY ONE SENSOR EVERY 10 DAYS, Disp: 36 each, Rfl: 0   DULoxetine  (CYMBALTA ) 60 MG capsule, Take 1 capsule (60 mg total) by mouth daily., Disp: 90 capsule, Rfl: 3   glucose blood (ACCU-CHEK SMARTVIEW) test strip, Use as instructed, Disp: 100 each, Rfl: 12   insulin  aspart (NOVOLOG  FLEXPEN) 100 UNIT/ML FlexPen, Inject 12 Units into the skin 2 (two) times daily with a meal. (Patient taking differently: Inject 12 Units into the skin 2 (two) times daily with a meal. 14-16 units), Disp: 15 mL, Rfl: 11   insulin  degludec (TRESIBA  FLEXTOUCH)  100 UNIT/ML FlexTouch Pen, Inject 25 Units into the skin daily. (Patient taking differently: Inject 24 Units into the skin daily.), Disp: 15 mL, Rfl: 0   JARDIANCE  25 MG TABS tablet, TAKE 1 TABLET BY MOUTH DAILY, Disp: 100 tablet, Rfl: 2   losartan -hydrochlorothiazide  (HYZAAR) 100-12.5 MG tablet, Take 1 tablet by mouth daily., Disp: 90 tablet, Rfl: 3   metFORMIN  (GLUCOPHAGE -XR) 500 MG 24 hr tablet, Take 2 tablets (1,000 mg total) by mouth daily., Disp: 270 tablet, Rfl: 3   metoprolol  succinate (TOPROL -XL) 50 MG 24 hr tablet, TAKE 1 TABLET BY MOUTH DAILY  WITH OR IMMEDIATELY FOLLOWING A  MEAL, Disp: 100 tablet, Rfl: 2   nitroGLYCERIN  (NITROSTAT ) 0.4 MG SL tablet, Place 1 tablet (0.4 mg  total) under the tongue every 5 (five) minutes as needed for chest pain., Disp: 25 tablet, Rfl: 3   omeprazole  (PRILOSEC) 40 MG capsule, TAKE 1 CAPSULE BY MOUTH DAILY AS NEEDED, Disp: 100 capsule, Rfl: 2   prednisoLONE acetate (PRED FORTE) 1 % ophthalmic suspension, Place 1 drop into the left eye 4 (four) times daily., Disp: , Rfl:    rosuvastatin  (CRESTOR ) 20 MG tablet, Take 1 tablet (20 mg total) by mouth daily., Disp: 90 tablet, Rfl: 3   Semaglutide , 2 MG/DOSE, (OZEMPIC , 2 MG/DOSE,) 8 MG/3ML SOPN, Inject 2 mg into the skin once a week., Disp: , Rfl:    spironolactone  (ALDACTONE ) 25 MG tablet, TAKE 1 TABLET BY MOUTH DAILY, Disp: 100 tablet, Rfl: 2   timolol (TIMOPTIC) 0.5 % ophthalmic solution, Place 1 drop into both eyes 2 times daily., Disp: , Rfl:

## 2024-05-07 ENCOUNTER — Encounter: Payer: Self-pay | Admitting: Pharmacist

## 2024-05-07 NOTE — Progress Notes (Signed)
 This patient is appearing on a report for being at risk of failing the adherence measure for hypertension (ACEi/ARB) medications this calendar year.   Medication: losartan /hydrochlorothiazide  100-12.5 mg  Last fill date: 04/22/24 for 100 day supply  Reviewed medication indication, dosing, and goals of therapy.   This patient is appearing on a report for being at risk of failing the adherence measure for cholesterol (statin) medications this calendar year.   Medication: rosuvastatin  20 mg Last fill date: 03/25/24 for 90 day supply  Reviewed medication indication, dosing, and goals of therapy.

## 2024-05-15 ENCOUNTER — Ambulatory Visit: Admitting: Cardiovascular Disease

## 2024-05-18 ENCOUNTER — Encounter: Payer: Self-pay | Admitting: Family Medicine

## 2024-05-19 MED ORDER — DEXCOM G7 15 DAY SENSOR MISC: 3 refills | Status: AC

## 2024-05-24 ENCOUNTER — Other Ambulatory Visit: Payer: Self-pay | Admitting: Family Medicine

## 2024-07-28 ENCOUNTER — Ambulatory Visit: Admitting: Cardiovascular Disease
